# Patient Record
Sex: Male | Born: 1945 | Race: White | Hispanic: No | Marital: Single | Smoking: Never smoker
Health system: Southern US, Community
[De-identification: ages and names within clinical notes are randomized; demographics above are authoritative.]

## PROBLEM LIST (undated history)

## (undated) DIAGNOSIS — R053 Chronic cough: Secondary | ICD-10-CM

## (undated) DIAGNOSIS — H269 Unspecified cataract: Secondary | ICD-10-CM

## (undated) DIAGNOSIS — J310 Chronic rhinitis: Secondary | ICD-10-CM

## (undated) DIAGNOSIS — J45909 Unspecified asthma, uncomplicated: Secondary | ICD-10-CM

## (undated) DIAGNOSIS — J069 Acute upper respiratory infection, unspecified: Secondary | ICD-10-CM

## (undated) DIAGNOSIS — J986 Disorders of diaphragm: Secondary | ICD-10-CM

## (undated) DIAGNOSIS — H409 Unspecified glaucoma: Secondary | ICD-10-CM

## (undated) DIAGNOSIS — H6123 Impacted cerumen, bilateral: Secondary | ICD-10-CM

## (undated) DIAGNOSIS — K219 Gastro-esophageal reflux disease without esophagitis: Secondary | ICD-10-CM

## (undated) DIAGNOSIS — I2510 Atherosclerotic heart disease of native coronary artery without angina pectoris: Secondary | ICD-10-CM

## (undated) DIAGNOSIS — M5136 Other intervertebral disc degeneration, lumbar region: Secondary | ICD-10-CM

## (undated) DIAGNOSIS — N3289 Other specified disorders of bladder: Secondary | ICD-10-CM

## (undated) DIAGNOSIS — M1990 Unspecified osteoarthritis, unspecified site: Secondary | ICD-10-CM

## (undated) DIAGNOSIS — E669 Obesity, unspecified: Secondary | ICD-10-CM

## (undated) DIAGNOSIS — H359 Unspecified retinal disorder: Secondary | ICD-10-CM

## (undated) DIAGNOSIS — R05 Cough: Secondary | ICD-10-CM

## (undated) DIAGNOSIS — M51369 Other intervertebral disc degeneration, lumbar region without mention of lumbar back pain or lower extremity pain: Secondary | ICD-10-CM

## (undated) DIAGNOSIS — D649 Anemia, unspecified: Secondary | ICD-10-CM

## (undated) DIAGNOSIS — J209 Acute bronchitis, unspecified: Secondary | ICD-10-CM

## (undated) DIAGNOSIS — J019 Acute sinusitis, unspecified: Secondary | ICD-10-CM

## (undated) DIAGNOSIS — E785 Hyperlipidemia, unspecified: Secondary | ICD-10-CM

## (undated) DIAGNOSIS — I4891 Unspecified atrial fibrillation: Secondary | ICD-10-CM

## (undated) DIAGNOSIS — K5190 Ulcerative colitis, unspecified, without complications: Secondary | ICD-10-CM

## (undated) DIAGNOSIS — I10 Essential (primary) hypertension: Secondary | ICD-10-CM

## (undated) HISTORY — DX: Acute sinusitis, unspecified: J01.90

## (undated) HISTORY — DX: Essential (primary) hypertension: I10

## (undated) HISTORY — DX: Acute upper respiratory infection, unspecified: J06.9

## (undated) HISTORY — DX: Unspecified atrial fibrillation: I48.91

## (undated) HISTORY — PX: CARDIAC CATHETERIZATION: SHX172

## (undated) HISTORY — DX: Cough: R05

## (undated) HISTORY — DX: Obesity, unspecified: E66.9

## (undated) HISTORY — DX: Atherosclerotic heart disease of native coronary artery without angina pectoris: I25.10

## (undated) HISTORY — DX: Hyperlipidemia, unspecified: E78.5

## (undated) HISTORY — DX: Impacted cerumen, bilateral: H61.23

## (undated) HISTORY — DX: Chronic rhinitis: J31.0

## (undated) HISTORY — DX: Unspecified osteoarthritis, unspecified site: M19.90

## (undated) HISTORY — DX: Acute bronchitis, unspecified: J20.9

## (undated) HISTORY — DX: Gastro-esophageal reflux disease without esophagitis: K21.9

## (undated) HISTORY — PX: CORONARY ARTERY BYPASS GRAFT: SHX141

## (undated) HISTORY — DX: Disorders of diaphragm: J98.6

## (undated) HISTORY — DX: Other intervertebral disc degeneration, lumbar region without mention of lumbar back pain or lower extremity pain: M51.369

## (undated) HISTORY — DX: Morbid (severe) obesity due to excess calories: E66.01

## (undated) HISTORY — DX: Unspecified asthma, uncomplicated: J45.909

## (undated) HISTORY — DX: Other intervertebral disc degeneration, lumbar region: M51.36

## (undated) HISTORY — DX: Ulcerative colitis, unspecified, without complications: K51.90

## (undated) HISTORY — DX: Chronic cough: R05.3

---

## 2000-07-14 ENCOUNTER — Inpatient Hospital Stay (HOSPITAL_COMMUNITY): Admission: EM | Admit: 2000-07-14 | Discharge: 2000-08-01 | Payer: Self-pay | Admitting: Emergency Medicine

## 2000-07-14 ENCOUNTER — Encounter: Payer: Self-pay | Admitting: Emergency Medicine

## 2000-07-18 ENCOUNTER — Encounter: Payer: Self-pay | Admitting: Gastroenterology

## 2000-07-18 ENCOUNTER — Encounter: Payer: Self-pay | Admitting: Thoracic Surgery (Cardiothoracic Vascular Surgery)

## 2000-07-20 ENCOUNTER — Encounter: Payer: Self-pay | Admitting: Thoracic Surgery (Cardiothoracic Vascular Surgery)

## 2000-07-21 ENCOUNTER — Encounter: Payer: Self-pay | Admitting: Thoracic Surgery (Cardiothoracic Vascular Surgery)

## 2000-07-22 ENCOUNTER — Encounter: Payer: Self-pay | Admitting: Thoracic Surgery (Cardiothoracic Vascular Surgery)

## 2000-07-23 ENCOUNTER — Encounter: Payer: Self-pay | Admitting: Thoracic Surgery (Cardiothoracic Vascular Surgery)

## 2000-08-28 ENCOUNTER — Encounter
Admission: RE | Admit: 2000-08-28 | Discharge: 2000-08-28 | Payer: Self-pay | Admitting: Thoracic Surgery (Cardiothoracic Vascular Surgery)

## 2000-08-28 ENCOUNTER — Encounter: Payer: Self-pay | Admitting: Thoracic Surgery (Cardiothoracic Vascular Surgery)

## 2000-09-04 ENCOUNTER — Encounter: Payer: Self-pay | Admitting: Thoracic Surgery (Cardiothoracic Vascular Surgery)

## 2000-09-04 ENCOUNTER — Encounter
Admission: RE | Admit: 2000-09-04 | Discharge: 2000-09-04 | Payer: Self-pay | Admitting: Thoracic Surgery (Cardiothoracic Vascular Surgery)

## 2000-10-02 ENCOUNTER — Encounter: Payer: Self-pay | Admitting: Thoracic Surgery (Cardiothoracic Vascular Surgery)

## 2000-10-02 ENCOUNTER — Encounter
Admission: RE | Admit: 2000-10-02 | Discharge: 2000-10-02 | Payer: Self-pay | Admitting: Thoracic Surgery (Cardiothoracic Vascular Surgery)

## 2002-09-25 ENCOUNTER — Ambulatory Visit (HOSPITAL_COMMUNITY): Admission: RE | Admit: 2002-09-25 | Discharge: 2002-09-25 | Payer: Self-pay | Admitting: Gastroenterology

## 2002-09-25 ENCOUNTER — Encounter (INDEPENDENT_AMBULATORY_CARE_PROVIDER_SITE_OTHER): Payer: Self-pay | Admitting: Specialist

## 2005-01-23 ENCOUNTER — Ambulatory Visit: Payer: Self-pay | Admitting: Internal Medicine

## 2005-03-08 ENCOUNTER — Ambulatory Visit: Payer: Self-pay | Admitting: Internal Medicine

## 2005-06-07 ENCOUNTER — Ambulatory Visit: Payer: Self-pay | Admitting: Internal Medicine

## 2005-06-15 ENCOUNTER — Ambulatory Visit (HOSPITAL_COMMUNITY): Admission: RE | Admit: 2005-06-15 | Discharge: 2005-06-15 | Payer: Self-pay | Admitting: Internal Medicine

## 2005-08-30 ENCOUNTER — Ambulatory Visit: Payer: Self-pay | Admitting: Internal Medicine

## 2005-11-04 ENCOUNTER — Encounter: Admission: RE | Admit: 2005-11-04 | Discharge: 2005-11-04 | Payer: Self-pay | Admitting: Rheumatology

## 2005-11-28 ENCOUNTER — Ambulatory Visit: Payer: Self-pay | Admitting: Internal Medicine

## 2005-12-11 ENCOUNTER — Encounter: Admission: RE | Admit: 2005-12-11 | Discharge: 2005-12-11 | Payer: Self-pay | Admitting: Anesthesiology

## 2006-12-23 ENCOUNTER — Ambulatory Visit: Payer: Self-pay | Admitting: Internal Medicine

## 2007-09-16 ENCOUNTER — Ambulatory Visit: Payer: Self-pay | Admitting: Internal Medicine

## 2007-10-28 ENCOUNTER — Ambulatory Visit: Payer: Self-pay | Admitting: Internal Medicine

## 2007-11-11 ENCOUNTER — Ambulatory Visit: Payer: Self-pay | Admitting: Internal Medicine

## 2007-11-24 DIAGNOSIS — K5190 Ulcerative colitis, unspecified, without complications: Secondary | ICD-10-CM | POA: Insufficient documentation

## 2007-11-24 DIAGNOSIS — J45909 Unspecified asthma, uncomplicated: Secondary | ICD-10-CM

## 2007-11-24 DIAGNOSIS — R059 Cough, unspecified: Secondary | ICD-10-CM

## 2007-11-24 DIAGNOSIS — R05 Cough: Secondary | ICD-10-CM

## 2007-11-24 HISTORY — DX: Unspecified asthma, uncomplicated: J45.909

## 2007-11-24 HISTORY — DX: Morbid (severe) obesity due to excess calories: E66.01

## 2007-11-24 HISTORY — DX: Ulcerative colitis, unspecified, without complications: K51.90

## 2007-11-24 HISTORY — DX: Cough, unspecified: R05.9

## 2007-11-25 ENCOUNTER — Ambulatory Visit: Payer: Self-pay | Admitting: Pulmonary Disease

## 2007-12-26 ENCOUNTER — Ambulatory Visit: Payer: Self-pay | Admitting: Internal Medicine

## 2008-04-09 ENCOUNTER — Ambulatory Visit: Payer: Self-pay | Admitting: Internal Medicine

## 2008-04-09 DIAGNOSIS — J069 Acute upper respiratory infection, unspecified: Secondary | ICD-10-CM

## 2008-04-09 HISTORY — DX: Acute upper respiratory infection, unspecified: J06.9

## 2008-05-20 ENCOUNTER — Ambulatory Visit: Payer: Self-pay | Admitting: Internal Medicine

## 2008-05-21 ENCOUNTER — Encounter: Payer: Self-pay | Admitting: Internal Medicine

## 2008-08-27 ENCOUNTER — Ambulatory Visit: Payer: Self-pay | Admitting: Internal Medicine

## 2008-08-27 DIAGNOSIS — K219 Gastro-esophageal reflux disease without esophagitis: Secondary | ICD-10-CM

## 2008-08-27 HISTORY — DX: Gastro-esophageal reflux disease without esophagitis: K21.9

## 2009-03-01 ENCOUNTER — Ambulatory Visit: Payer: Self-pay | Admitting: Internal Medicine

## 2009-03-15 ENCOUNTER — Ambulatory Visit: Payer: Self-pay | Admitting: Internal Medicine

## 2009-03-15 ENCOUNTER — Telehealth (INDEPENDENT_AMBULATORY_CARE_PROVIDER_SITE_OTHER): Payer: Self-pay | Admitting: *Deleted

## 2009-04-08 ENCOUNTER — Encounter: Payer: Self-pay | Admitting: Internal Medicine

## 2009-05-02 ENCOUNTER — Ambulatory Visit: Payer: Self-pay | Admitting: Internal Medicine

## 2009-05-02 DIAGNOSIS — J310 Chronic rhinitis: Secondary | ICD-10-CM

## 2009-05-02 HISTORY — DX: Chronic rhinitis: J31.0

## 2009-06-09 ENCOUNTER — Encounter: Payer: Self-pay | Admitting: Internal Medicine

## 2009-08-01 ENCOUNTER — Ambulatory Visit: Payer: Self-pay | Admitting: Internal Medicine

## 2009-10-20 ENCOUNTER — Ambulatory Visit: Payer: Self-pay | Admitting: Adult Health

## 2009-10-31 ENCOUNTER — Encounter: Payer: Self-pay | Admitting: Internal Medicine

## 2009-11-01 ENCOUNTER — Ambulatory Visit: Payer: Self-pay | Admitting: Internal Medicine

## 2009-11-25 ENCOUNTER — Encounter: Payer: Self-pay | Admitting: Internal Medicine

## 2010-01-31 ENCOUNTER — Ambulatory Visit: Payer: Self-pay | Admitting: Internal Medicine

## 2010-03-14 ENCOUNTER — Ambulatory Visit: Payer: Self-pay | Admitting: Internal Medicine

## 2010-05-16 ENCOUNTER — Ambulatory Visit: Payer: Self-pay | Admitting: Internal Medicine

## 2010-08-09 ENCOUNTER — Telehealth (INDEPENDENT_AMBULATORY_CARE_PROVIDER_SITE_OTHER): Payer: Self-pay | Admitting: *Deleted

## 2010-08-14 ENCOUNTER — Ambulatory Visit: Payer: Self-pay | Admitting: Internal Medicine

## 2010-08-14 DIAGNOSIS — J019 Acute sinusitis, unspecified: Secondary | ICD-10-CM

## 2010-08-14 HISTORY — DX: Acute sinusitis, unspecified: J01.90

## 2010-09-11 ENCOUNTER — Ambulatory Visit: Payer: Self-pay | Admitting: Internal Medicine

## 2010-12-27 ENCOUNTER — Encounter: Payer: Self-pay | Admitting: Internal Medicine

## 2011-01-01 ENCOUNTER — Encounter: Payer: Self-pay | Admitting: Internal Medicine

## 2011-01-06 ENCOUNTER — Encounter: Payer: Self-pay | Admitting: Anesthesiology

## 2011-01-10 ENCOUNTER — Ambulatory Visit
Admission: RE | Admit: 2011-01-10 | Discharge: 2011-01-10 | Payer: Self-pay | Source: Home / Self Care | Attending: Adult Health | Admitting: Adult Health

## 2011-01-16 NOTE — Assessment & Plan Note (Signed)
Summary: Pulmonary/ acute ext ov with hfa teaching   Primary Provider/Referring Provider:  Danise Edge  CC:  3 month followup.  Pt c/o cough x 5 days- prod with clear to yellow sputum.  He also has noticed wheezing and increased SOB with activity.  He states that his chest is sore from cough..  History of Present Illness: 58 yowm never smoker with  relatively mild asthma but superimposed on obesity plus a chronically paralyzed left hemidiaphragm and a tendency to upper airway cough syndrome.    05/20/08 change to neb b and b two times a day and doing fine  August 27, 2008 requesting refills of reglan having slt more reflux but only prilosec once daily, no new resp problems or need for rescue rx, rec try off reglan  March 01, 2009 ov doing well on med calendar and neb with b and b until one week prior to ov increase sinus drainage and cough and sob slt yellow mucus.  doe but not at rest.  March 15, 2009 - pt here for med calendar visit and also acutely ill.  Med calendar with few additions.  Pt has no complaints of current med regimen.  Pt has low grade temp of 100.3 today, symptoms began this four days ago, c/o nasal congestion, sore throat, maxillary sinus tenderness, chest congestion, productive cough of light yellow sputum, SOB with exertion, chest tightness, occasional wheezing, ear fullness, HA. rx augmentin no prednisone > better  January 31, 2010 ov  c/o cough x 5 days- prod with clear to yellow sputum.  He also has noticed wheezing and increased SOB with activity.  He states that his chest is sore from cough. states following med calendar prns well.  no lateralizing pleuritic cp.  Pt denies any significant sore throat, dysphagia, itching, sneezing,  nasal congestion or excess secretions,  fever, chills, sweats, unintended wt loss,  exertional cp, hempoptysis, change in activity tolerance  orthopnea pnd or leg swelling.     Current Medications (verified): 1)  Lipitor 40 Mg  Tabs  (Atorvastatin Calcium) .... Take 1 Tablet By Mouth Once A Day 2)  Zetia 10 Mg  Tabs (Ezetimibe) .... Take 1 Tablet By Mouth Once A Day 3)  Metoprolol Tartrate 25 Mg  Tabs (Metoprolol Tartrate) .... Take 1 Tablet By Mouth Once A Day 4)  Folic Acid 1 Mg  Tabs (Folic Acid) .... Take 1 Tablet By Mouth Once A Day 5)  Diltiazem Hcl Cr 180 Mg Xr24h-Cap (Diltiazem Hcl) .... Take 1 Tablet By Mouth Once A Day 6)  Digoxin 0.125 Mg  Tabs (Digoxin) .... Take 1 Tablet By Mouth Once A Day 7)  Prilosec 20 Mg  Cpdr (Omeprazole) .... Take 1 Tablet By Mouth Once A Day 8)  Colace 100 Mg Caps (Docusate Sodium) .... Take 1 Capsule By Mouth Once A Day 9)  Sulfasalazine 500 Mg  Tabs (Sulfasalazine) .... Take 2 Tablets By Mouth Four Times A Day 10)  Cvs Eye Drops 0.05 %  Soln (Tetrahydrozoline Hcl) .... 2 Drops in Each Eye  Two Times A Day 11)  Cvs Glucosamine-Chondroitin   Tabs (Glucosamine-Chondroit-Vit C-Mn) .... Take 1 Tablet By Mouth Two Times A Day 12)  Ocuvite   Tabs (Multiple Vitamins-Minerals) .... Take 2 Tabs By Mouth Two Times A Day 13)  Acetaminophen-Codeine #3 300-30 Mg  Tabs (Acetaminophen-Codeine) .... Take 1 Tablet By Mouth Two Times A Day 14)  Bayer Low Strength 81 Mg  Tbec (Aspirin) .... Take 1 Tablet By Mouth Once  A Day 15)  Brovana 15 Mcg/22ml Nebu (Arformoterol Tartrate) .[name].. 1 Vial in Nebulizer Two Times A Day 16)  Pulmicort 0.5 Mg/68ml Susp (Budesonide) .[name].. 1 Vial in Nebulizer Two Times A Day 17)  Mucinex Dm 30-600 Mg  Tb12 (Dextromethorphan-Guaifenesin) .... Take 1 To 2 Tabs By Mouth Every 12 Hours As Needed 18)  Proventil Hfa 108 (90 Base) Mcg/act  Aers (Albuterol Sulfate) .[name].. 1-2 Puffs Every 4 Hours As Needed 19)  Dulcolax 5 Mg Tbec (Bisacodyl) .... Take 1 Tablet By Mouth Once A Day As Needed 20)  Saline Nasal Spray 0.65 % Soln (Saline) .... 2 Puffs Every 4 Hours As Needed 21)  Advil Cold/sinus 30-200 Mg Tabs (Pseudoephedrine-Ibuprofen) .... As Directed As Needed 22)  Zyrtec Allergy 10 Mg Tabs  (Cetirizine Hcl) .... Take 1 Tab By Mouth At Bedtime As Needed 23)  Pepcid Ac Maximum Strength 20 Mg Tabs (Famotidine) .[name].. 1 At Bedtime As Needed For Increased Cough/congestion  Allergies (verified): 1)  ! 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 Health Maintenance...........................................................Daphine Deutscher     - Pneumovax age 26 November 01, 2009 (second shot)   Vital Signs:  Patient profile:   65 year old male Weight:      248 pounds O2 Sat:      92 % on Room air Temp:     97.8 degrees F oral Pulse rate:   82 / minute BP sitting:   128 / 80  (left arm)  Vitals Entered By: Vernie Murders (January 31, 2010 10:17 AM)  O2 Flow:  Room air  Physical Exam  Additional Exam:  GEN: A/Ox3; pleasant , NAD   wt  250 May 02, 2009 > 249 August 01, 2009 >>246 October 20, 2009 > 251 November 01, 2009 > 248 January 31, 2010  HEENT:  [place]/AT, , EACs-clear, TMs-wnl, NOSE-clear, THROAT-reddened, not swollen, no drainage observed, bilateral maxillary sinus tenderness elicited NECK:  Supple w/ fair ROM; no JVD; normal carotid impulses w/o bruits; no thyromegaly or nodules palpated; no lymphadenopathy. RESP  Distant  BS w/  minimal  wheezing  CARD:  RRR, no m/r/g   GI:   Soft & nt; nml bowel sounds; no organomegaly or masses detected. Musco: Warm bil,  no calf tenderness edema, clubbing, pulses intact     Impression & Recommendations:  Problem # 1:  ASTHMA (ICD-493.90)  I spent extra time with the patient today explaining optimal mdi  technique.  This improved from   25 -50%, luckily he has a nebulizer for maint rx or he'd likely become systemically steroid dependent. for now should be able to get back to baseline with one burst of prednisone.  I had an extended discussion with the patient today lasting 15 to 20 minutes of a 25 minute visit on the following issues:  Each maintenance medication was reviewed in detail including most importantly the difference between maintenance and as needed and under what circumstances the prns are to be used.This was done in the context of a medication calendar review which provided the patient with a user-friendly unambiguous mechanism for  medication administration and reconciliation and provides an action plan for all active problems. It is critical that this be shown to every doctor  for modification during the office visit if necessary so the patient can use it as a working document. See instructions for specific recommendations.  Will need f/u ov with new calendar next ov.  Problem # 2:  URI (ICD-465.9)  Explained natural h/o uri and why it's necessary in patients at risk to rx short term with max Rx to reduce risk of evolving cyclical cough triggered by epithelial injury and a heightened sensitivty to the effects of any upper airway irritants,  most importantly acid - related    His updated medication list for this problem includes:    Bayer Low Strength 81 Mg Tbec (Aspirin) .[name]... Take 1 tablet by mouth once a day    Mucinex Dm 30-600 Mg Tb12 (Dextromethorphan-guaifenesin) .[name]... Take 1 to 2 tabs by mouth every 12 hours as needed    Advil Cold/sinus 30-200 Mg Tabs (Pseudoephedrine-ibuprofen) .[name]... As directed as needed    Zyrtec Allergy 10 Mg Tabs (Cetirizine hcl) .[name]... Take 1 tab by mouth at bedtime as needed  Orders: Est. Patient Level III ([address])  Medications Added to Medication List This Visit: 1)  Pepcid Ac Maximum Strength 20 Mg Tabs (Famotidine) .[name].. 1 at bedtime as needed for increased cough/congestion 2)   Proair Hfa 108 (90 Base) Mcg/act Aers (Albuterol sulfate) .[name].. 1-2 puffs every 4-6 hours as needed 3)  Prednisone 10 Mg Tabs (Prednisone) .... 4 each am x 2days, 2x2days, 1x2days and stop 4)  Augmentin 875-125 Mg Tabs (Amoxicillin-pot clavulanate) .... By mouth twice daily  Other Orders: HFA Instruction [phone])  Patient Instructions: 1)  Prednsione 4 each am x 2days, 2x2days, 1x2days and stop 2)  Augmentin 875 mg twice daily for 7 days, yogurt for lunch 3)  See [name] NP w/in 6 weeks with all your medications, even over the counter meds, separated in two separate bags, the ones you take no matter what vs the ones you stop once you feel better and take only as needed.  She will generate for you a new user friendly medication calendar that will put [place] all on the same page re: your medication use.  Prescriptions: AUGMENTIN 875-125 MG  TABS (AMOXICILLIN-POT CLAVULANATE) By mouth twice daily  #14 x 0   Entered and Authorized by:   Nyoka Cowden MD   Signed by:   Nyoka Cowden MD on 01/31/2010   Method used:   Electronically to        [facility] [phone]* (retail)       [address]       [place], [place]  [address]       Ph: [phone] or [phone]       Fax: [phone]   RxID:   [id number] PREDNISONE 10 MG  TABS (PREDNISONE) 4 each am x 2days, 2x2days, 1x2days and stop  #15 x 0   Entered and Authorized by:   Nyoka Cowden MD   Signed by:   Nyoka Cowden MD on 01/31/2010   Method used:   Electronically to        [facility] [phone][facility] (retail)       [address]       [place], [place]  [address]       Ph: [phone] or [phone]       Fax: [phone]   RxID:   [id number] PROAIR HFA 108 (  90 BASE) MCG/ACT  AERS (ALBUTEROL SULFATE) 1-2 puffs every 4-6 hours as needed  #1 x 11   Entered and Authorized by:   Nyoka Cowden MD   Signed by:   Nyoka Cowden MD on 01/31/2010   Method used:   Electronically to        [facility] [phone]* (retail)       [address]        [place], [place]  [address]       Ph: [phone] or [phone]       Fax: [phone]   RxID:   [id number]

## 2011-01-16 NOTE — Assessment & Plan Note (Signed)
Summary: Pulmonary/ acute flare asthma   Primary [place]/Referring Knox Holdman:  Danise Edge  CC:  2 month followup.  Pt states that his cough is the same- still coughs up clear thick sputum daily.  No new complaints today.[name]  History of Present Illness: 56 yowm never smoker with  relatively mild asthma but superimposed on obesity plus a chronically paralyzed left hemidiaphragm and a tendency to upper airway cough syndrome.    05/20/08 change to neb b and b two times a day and doing fine  August 27, 2008 requesting refills of reglan having slt more reflux but only prilosec once daily, no new resp problems or need for rescue rx, rec try off reglan  March 01, 2009 ov doing well on med calendar and neb with b and b until one week prior to ov increase sinus drainage and cough and sob slt yellow mucus.  doe but not at rest.  March 15, 2009 - pt here for med calendar visit and also acutely ill.  Med calendar with few additions.  Pt has no complaints of current med regimen.  Pt has low grade temp of 100.3 today, symptoms began this four days ago, c/o nasal congestion, sore throat, maxillary sinus tenderness, chest congestion, productive cough of light yellow sputum, SOB with exertion, chest tightness, occasional wheezing, ear fullness, HA. rx augmentin no prednisone > better  January 31, 2010 ov  c/o cough x 5 days- prod with clear to yellow sputum.  He also has noticed wheezing and increased SOB with activity.  He states that his chest is sore from cough. states following med calendar prns well.  no lateralizing pleuritic cp.  rx augmentn and prednisone and resolved  May 16, 2010 2 month followup.  Pt states that his cough is the same- still coughs up clear thick sputum daily.  No new complaints today. rx with mucinex dm and pepcid 20 mg on as needed basis but not consistently activating written action plan. Pt denies any significant sore throat, dysphagia, itching, sneezing,  nasal congestion or  excess secretions,  fever, chills, sweats, unintended wt loss, pleuritic or exertional cp, hempoptysis, change in activity tolerance  orthopnea pnd or leg swelling/    Current Medications (verified): 1)  Lipitor 40 Mg  Tabs (Atorvastatin Calcium) .... Take 1 Tablet By Mouth Once A Day 2)  Zetia 10 Mg  Tabs (Ezetimibe) .... Take 1 Tablet By Mouth Once A Day 3)  Metoprolol Tartrate 25 Mg  Tabs (Metoprolol Tartrate) .... Take 1 Tablet By Mouth Once A Day 4)  Folic Acid 1 Mg  Tabs (Folic Acid) .... Take 1 Tablet By Mouth Once A Day 5)  Diltiazem Hcl Cr 180 Mg Xr24h-Cap (Diltiazem Hcl) .... Take 1 Tablet By Mouth Once A Day 6)  Digoxin 0.125 Mg  Tabs (Digoxin) .... Take 1 Tablet By Mouth Once A Day 7)  Prilosec 20 Mg  Cpdr (Omeprazole) .... Take 1 Tablet By Mouth Once A Day 8)  Bayer Low Strength 81 Mg  Tbec (Aspirin) .... Take 1 Tablet By Mouth Once A Day 9)  Sulfasalazine 500 Mg  Tabs (Sulfasalazine) .... Take 2 Tablets By Mouth Four Times A Day 10)  Cvs Eye Drops 0.05 %  Soln (Tetrahydrozoline Hcl) .... 2 Drops in Each Eye  Two Times A Day 11)  Cvs Glucosamine-Chondroitin   Tabs (Glucosamine-Chondroit-Vit C-Mn) .... Take 1 Tablet By Mouth Two Times A Day 12)  Ocuvite   Tabs (Multiple Vitamins-Minerals) .... Take 2 Tabs By Mouth Two  Times A Day 13)  Tylenol Extra Strength 500 Mg Tabs (Acetaminophen) .... Take 1 Tablet By Mouth Two Times A Day 14)  Pulmicort 0.5 Mg/5ml Susp (Budesonide) .[name].. 1 Vial in Nebulizer Two Times A Day 15)  Brovana 15 Mcg/57ml Nebu (Arformoterol Tartrate) .[name].. 1 Vial in Nebulizer Two Times A Day 16)  Mucinex Dm 30-600 Mg  Tb12 (Dextromethorphan-Guaifenesin) .... Take 1 To 2 Tabs By Mouth Every 12 Hours As Needed 17)  Proair Hfa 108 (90 Base) Mcg/act  Aers (Albuterol Sulfate) .... 2 Puffs Every 4 Hours As Needed 18)  Colace 100 Mg Caps (Docusate Sodium) .... Take 1 Capsule By Mouth Once A Day As Needed 19)  Dulcolax 5 Mg Tbec (Bisacodyl) .... Take 1 Tablet By Mouth Once A  Day As Needed 20)  Saline Nasal Spray 0.65 % Soln (Saline) .... 2 Puffs Every 4 Hours As Needed 21)  Zyrtec Allergy 10 Mg Tabs (Cetirizine Hcl) .... Take 1 Tab By Mouth At Bedtime As Needed 22)  Advil Cold/sinus 30-200 Mg Tabs (Pseudoephedrine-Ibuprofen) .... Per Bottle As Needed 23)  Pepcid Ac Maximum Strength 20 Mg Tabs (Famotidine) .[name].. 1 At Bedtime As Needed For Increased Cough/congestion  Allergies (verified): 1)  ! 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 Health Maintenance..............................................................Daphine Deutscher     - Pneumovax age 1 November 01, 2009 (second shot)   Vital Signs:  Patient profile:   66 year old male Weight:      248 pounds O2 Sat:      92 % on Room air Temp:     98.3 degrees F oral Pulse rate:   87 / minute BP sitting:   118 / 60  (left arm)  Vitals Entered By: Vernie Murders (May 16, 2010 11:49 AM)  O2 Flow:  Room air  Physical Exam  Additional Exam:  GEN: A/Ox3; pleasant , NAD   wt  250 May 02, 2009 >  251 November 01, 2009 > 248 January 31, 2010 >>251 March 14, 2010 > 248 May 16, 2010  HEENT:  [place]/AT, , EACs-clear, TMs-wnl, NOSE-clear, THROAT clear no drainage observed,   NECK:  Supple w/ fair ROM; no JVD; normal carotid impulses w/o bruits; no thyromegaly or nodules palpated; no lymphadenopathy. RESP  Distant  BS  no wheezing  CARD:  RRR, no m/r/g   GI:   Soft & nt; nml bowel sounds; no organomegaly or masses detected. Musco: Warm bil,  no calf tenderness edema, clubbing, pulses intact     Impression &  Recommendations:  Problem # 1:  ASTHMA (ICD-493.90) DDX of  difficult airways managment all start with A and  include Adherence, Ace Inhibitors, Acid Reflux, Active Sinus Disease, Alpha 1 Antitripsin deficiency, Anxiety masquerading as Airways dz,  ABPA,  allergy(esp in young), Aspiration (esp in elderly), Adverse effects of DPI,  Active smokers, plus one B  = Beta blocker use..   Adherence:  doing well with med calendar maint rx but not with contingencies:  try change pepcid to 20 mg at bedtime as maintenance.  Each maintenance medication was reviewed in detail including  most importantly the difference between maintenance prns and under what circumstances the prns are to be used.  In addition, these two groups (for which the patient should keep up with refills) were distinguished from a third group :  meds that are used only short term with the intent to complete a course of therapy and then not refill them.  The med list was then fully reconciled and reorganized to reflect this important distinction. This was done in the context of a medication calendar review which provided the patient with a user-friendly unambiguous mechanism for medication administration and reconciliation and provides an action plan for all active problems. It is critical that this be shown to every doctor  for modification during the office visit if necessary so the patient can use it as a working document.   Medications Added to Medication List This Visit: 1)  Pepcid Ac Maximum Strength 20 Mg Tabs (Famotidine) .[name].. 1 at bedtime 2)  Prednisone 10 Mg Tabs (Prednisone) .... 4 each am x 2days, 2x2days, 1x2days and stop  Other Orders: Est. Patient Level III ([address])  Patient Instructions: 1)  Change pepcid to 20 mg one at bedtime (not as needed) 2)  Prednisone 10 mg 4 each am x 2days, 2x2days, 1x2days and stop  3)  Return to office in 3 months, sooner if needed  Prescriptions: PREDNISONE 10 MG  TABS (PREDNISONE) 4 each am x  2days, 2x2days, 1x2days and stop  #14 x 0   Entered and Authorized by:   Nyoka Cowden MD   Signed by:   Nyoka Cowden MD on 05/16/2010   Method used:   [facility]ron[facility] to        [facility] [phone]* (retail)       [address]       [place], [place]  [address]       Ph: [phone] or [phone]       Fax: [phone]   RxID:   [id number]   Appended Document: Pulmonary/ acute flare asthma please fax dr Donnie Aho a copy of this note for his records  Appended Document: Pulmonary/ acute flare asthma Note faxed to Dr Donnie Aho via [place].

## 2011-01-16 NOTE — Assessment & Plan Note (Signed)
Summary: med calendar update   Primary Provider/Referring Provider:  Danise Edge  CC:  est med calendar - pt brought all meds with him today.  c/o increased SOB, wheezing, chest congestion, dry cough, and sinus pressure/congestion and ear pressure x1week - denies f/c/s.  History of Present Illness: 38 yowm never smoker with  relatively mild asthma but superimposed on obesity plus a chronically paralyzed left hemidiaphragm and a tendency to upper airway cough syndrome.    05/20/08 change to neb b and b two times a day and doing fine  August 27, 2008 requesting refills of reglan having slt more reflux but only prilosec once daily, no new resp problems or need for rescue rx, rec try off reglan  March 01, 2009 ov doing well on med calendar and neb with b and b until one week prior to ov increase sinus drainage and cough and sob slt yellow mucus.  doe but not at rest.  March 15, 2009 - pt here for med calendar visit and also acutely ill.  Med calendar with few additions.  Pt has no complaints of current med regimen.  Pt has low grade temp of 100.3 today, symptoms began this four days ago, c/o nasal congestion, sore throat, maxillary sinus tenderness, chest congestion, productive cough of light yellow sputum, SOB with exertion, chest tightness, occasional wheezing, ear fullness, HA. rx augmentin no prednisone > better  January 31, 2010 ov  c/o cough x 5 days- prod with clear to yellow sputum.  He also has noticed wheezing and increased SOB with activity.  He states that his chest is sore from cough. states following med calendar prns well.  no lateralizing pleuritic cp.  rx augmentn and prednisone and resolved  May 16, 2010 2 month followup.  Pt states that his cough is the same- still coughs up clear thick sputum daily.  No new complaints today. rx with mucinex dm and pepcid 20 mg on as needed basis but not consistently activating written action plan.   August 14, 2010--Presents for follow up  and med review.  pt brought all meds with him today.  Also he is feeling sick for last 1 week. He complains of  increased SOB, wheezing, chest congestion, dry cough, sinus pressure/congestion and ear pressure x1week. . We reviewed his meds and updated his med calendar. Emphasized the importance of keeping up with his meds. OTC not helping his sinus congestion. Denies chest pain, dyspnea, orthopnea, hemoptysis, fever, n/v/d, edema.  Medications Prior to Update: 1)  Lipitor 40 Mg  Tabs (Atorvastatin Calcium) .... Take 1 Tablet By Mouth Once A Day 2)  Zetia 10 Mg  Tabs (Ezetimibe) .... Take 1 Tablet By Mouth Once A Day 3)  Metoprolol Tartrate 25 Mg  Tabs (Metoprolol Tartrate) .... Take 1 Tablet By Mouth Once A Day 4)  Folic Acid 1 Mg  Tabs (Folic Acid) .... Take 1 Tablet By Mouth Once A Day 5)  Diltiazem Hcl Cr 180 Mg Xr24h-Cap (Diltiazem Hcl) .... Take 1 Tablet By Mouth Once A Day 6)  Digoxin 0.125 Mg  Tabs (Digoxin) .... Take 1 Tablet By Mouth Once A Day 7)  Prilosec 20 Mg  Cpdr (Omeprazole) .... Take 1 Tablet By Mouth Once A Day 8)  Bayer Low Strength 81 Mg  Tbec (Aspirin) .... Take 1 Tablet By Mouth Once A Day 9)  Sulfasalazine 500 Mg  Tabs (Sulfasalazine) .... Take 2 Tablets By Mouth Four Times A Day 10)  Cvs Eye Drops 0.05 %  Soln (  Tetrahydrozoline Hcl) .... 2 Drops in Each Eye  Two Times A Day 11)  Cvs Glucosamine-Chondroitin   Tabs (Glucosamine-Chondroit-Vit C-Mn) .... Take 1 Tablet By Mouth Two Times A Day 12)  Ocuvite   Tabs (Multiple Vitamins-Minerals) .... Take 2 Tabs By Mouth Two Times A Day 13)  Tylenol Extra Strength 500 Mg Tabs (Acetaminophen) .... Take 1 Tablet By Mouth Two Times A Day 14)  Pulmicort 0.5 Mg/53ml Susp (Budesonide) .[name].. 1 Vial in Nebulizer Two Times A Day 15)  Brovana 15 Mcg/69ml Nebu (Arformoterol Tartrate) .[name].. 1 Vial in Nebulizer Two Times A Day 16)  Pepcid Ac Maximum Strength 20 Mg Tabs (Famotidine) .[name].. 1 At Bedtime 17)  Mucinex Dm 30-600 Mg  Tb12  (Dextromethorphan-Guaifenesin) .... Take 1 To 2 Tabs By Mouth Every 12 Hours As Needed 18)  Proair Hfa 108 (90 Base) Mcg/act  Aers (Albuterol Sulfate) .... 2 Puffs Every 4 Hours As Needed 19)  Colace 100 Mg Caps (Docusate Sodium) .... Take 1 Capsule By Mouth Once A Day As Needed 20)  Dulcolax 5 Mg Tbec (Bisacodyl) .... Take 1 Tablet By Mouth Once A Day As Needed 21)  Saline Nasal Spray 0.65 % Soln (Saline) .... 2 Puffs Every 4 Hours As Needed 22)  Zyrtec Allergy 10 Mg Tabs (Cetirizine Hcl) .... Take 1 Tab By Mouth At Bedtime As Needed 23)  Advil Cold/sinus 30-200 Mg Tabs (Pseudoephedrine-Ibuprofen) .... Per Bottle As Needed 24)  Prednisone 10 Mg  Tabs (Prednisone) .... 4 Each Am X 2days, 2x2days, 1x2days and Stop  Allergies (verified): 1)  ! 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  c/o increased SOB, wheezing, chest congestion, dry cough, sinus pressure/congestion and ear pressure x1week - denies f/c/s Is Patient Diabetic? No Comments Medications reviewed with patient Daytime contact number verified with patient. Boone Master CNA/MA  August 14, 2010 9:02 AM    Physical Exam  Additional Exam:  GEN: A/Ox3; pleasant , NAD   wt  250 May 02, 2009 >  251 November 01, 2009 > 248 January 31, 2010 >>251 March 14, 2010 > 248 May 16, 2010 >>248  August 14, 2010 HEENT:  [place]/AT, , EACs-clear, TMs-wnl, NOSE-mild redness , THROAT clear, max tenderness  NECK:  Supple w/ fair ROM; no JVD; normal carotid impulses w/o bruits; no thyromegaly or nodules palpated; no lymphadenopathy. RESP  Distant  BS  no wheezing  CARD:  RRR, no m/r/g   GI:   Soft & nt; nml bowel sounds; no organomegaly or masses detected. Musco: Warm bil,  no calf tenderness edema, clubbing, pulses intact     Impression & Recommendations:  Problem # 1:  ACUTE SINUSITIS, UNSPECIFIED (ICD-461.9)  Augmentin 875mg  two times a day for 10 days -take w/ food Mucinex DM two times a day as needed cough/congestion  Continue w/ saline nasal rinses as needed  Follow med calendar closely and bring to each visit.  Please contact office for sooner follow up if symptoms do not improve or worsen  follow up NP Moishy Laday 4-6 weeks.  His updated medication list for this problem includes:    Mucinex Dm 30-600 Mg Tb12 (Dextromethorphan-guaifenesin) .[name]... Take 1 to 2 tabs by mouth every 12 hours as needed    Saline Nasal Spray 0.65 % Soln (Saline) .[name]... 2 puffs every 4 hours as needed    Advil Cold/sinus 30-200 Mg Tabs (Pseudoephedrine-ibuprofen) .[name]... Per bottle as needed    Augmentin 875-125 Mg Tabs (Amoxicillin-pot clavulanate) .[name]... 1 by mouth two times a day  Orders: Est. Patient Level IV  ([address])  Problem # 2:  ASTHMA (ICD-493.90) we discussed his meds and organized them in his med calendar   Medications Added to Medication List This Visit: 1)  Augmentin 875-125 Mg Tabs (Amoxicillin-pot clavulanate) .[name].. 1 by mouth two times a day  Patient Instructions: 1)  Augmentin 875mg  two times a day for 10 days -take w/ food 2)  Mucinex DM two times a day as needed cough/congestion  3)  Continue w/ saline nasal rinses as needed  4)  Follow med calendar closely and bring to each visit.  5)  Please contact office for sooner follow up if symptoms do not improve or worsen  6)  follow up NP Ronnica Dreese 4-6 weeks.  Prescriptions: AUGMENTIN 875-125 MG TABS (AMOXICILLIN-POT CLAVULANATE) 1 by mouth two times a day  #20 x 0   Entered and Authorized by:   Rubye Oaks NP   Signed by:   Daquon Greenleaf NP on 08/14/2010   Method used:   Electronically to        [facility]       [address]       [place], [place]  [address]       Ph: [phone] or [phone]       Fax: [phone]   RxID:   [id number]

## 2011-01-16 NOTE — Assessment & Plan Note (Signed)
Summary: NP follow up - med calendar   Primary Provider/Referring Provider:  Danise Edge  CC:  est med calendar - pt brought all meds with him today.  pt does c/o a return of his head and chest congestion with clear mucus with wheezing and SOB x1 week.  the abx and pred taper from last OV did help but sx returned.  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    05/20/08 change to neb b and b two times a day and doing fine  August 27, 2008 requesting refills of reglan having slt more reflux but only prilosec once daily, no new resp problems or need for rescue rx, rec try off reglan  March 01, 2009 ov doing well on med calendar and neb with b and b until one week prior to ov increase sinus drainage and cough and sob slt yellow mucus.  doe but not at rest.  March 15, 2009 - pt here for med calendar visit and also acutely ill.  Med calendar with few additions.  Pt has no complaints of current med regimen.  Pt has low grade temp of 100.3 today, symptoms began this four days ago, c/o nasal congestion, sore throat, maxillary sinus tenderness, chest congestion, productive cough of light yellow sputum, SOB with exertion, chest tightness, occasional wheezing, ear fullness, HA. rx augmentin no prednisone > better  January 31, 2010 ov  c/o cough x 5 days- prod with clear to yellow sputum.  He also has noticed wheezing and increased SOB with activity.  He states that his chest is sore from cough. states following med calendar prns well.  no lateralizing pleuritic cp.    March 14, 2010--Presents for follow up and med review. Last visit, w/ bronchitic flare tx w/ Augmentin and prednisone taper. He is feeling better w/ decreased cough/congestion , does notice w/ change in weather (snow/cold temp) , nasal congestion/cough returning. no discolored  mucus/fever. We reviewed his meds , went over scenarios of how to take as needed" meds. Denies chest pain,  orthopnea, hemoptysis, fever, n/v/d, edema, headache.   Medications Prior to Update: 1)  Lipitor 40 Mg  Tabs (Atorvastatin Calcium) .... Take 1 Tablet By Mouth Once A Day 2)  Zetia 10 Mg  Tabs (Ezetimibe) .... Take 1 Tablet By Mouth Once A Day 3)  Metoprolol Tartrate 25 Mg  Tabs (Metoprolol Tartrate) .... Take 1 Tablet By Mouth Once A Day 4)  Folic Acid 1 Mg  Tabs (Folic Acid) .... Take 1 Tablet By Mouth Once A Day 5)  Diltiazem Hcl Cr 180 Mg Xr24h-Cap (Diltiazem Hcl) .... Take 1 Tablet By Mouth Once A Day 6)  Digoxin 0.125 Mg  Tabs (Digoxin) .... Take 1 Tablet By Mouth Once A Day 7)  Prilosec 20 Mg  Cpdr (Omeprazole) .... Take 1 Tablet By Mouth Once A Day 8)  Colace 100 Mg Caps (Docusate Sodium) .... Take 1 Capsule By Mouth Once A Day 9)  Sulfasalazine 500 Mg  Tabs (Sulfasalazine) .... Take 2 Tablets By Mouth Four Times A Day 10)  Cvs Eye Drops 0.05 %  Soln (Tetrahydrozoline Hcl) .... 2 Drops in Each Eye  Two Times A Day 11)  Cvs Glucosamine-Chondroitin   Tabs (Glucosamine-Chondroit-Vit C-Mn) .... Take 1 Tablet By Mouth Two Times A Day 12)  Ocuvite   Tabs (Multiple Vitamins-Minerals) .... Take 2 Tabs By  Mouth Two Times A Day 13)  Acetaminophen-Codeine #3 300-30 Mg  Tabs (Acetaminophen-Codeine) .... Take 1 Tablet By Mouth Two Times A Day 14)  Bayer Low Strength 81 Mg  Tbec (Aspirin) .... Take 1 Tablet By Mouth Once A Day 15)  Brovana 15 Mcg/67ml Nebu (Arformoterol Tartrate) .[name].. 1 Vial in Nebulizer Two Times A Day 16)  Pulmicort 0.5 Mg/54ml Susp (Budesonide) .[name].. 1 Vial in Nebulizer Two Times A Day 17)  Mucinex Dm 30-600 Mg  Tb12 (Dextromethorphan-Guaifenesin) .... Take 1 To 2 Tabs By Mouth Every 12 Hours As Needed 18)  Dulcolax 5 Mg Tbec (Bisacodyl) .... Take 1 Tablet By Mouth Once A Day As Needed 19)  Saline Nasal Spray 0.65 % Soln (Saline) .... 2 Puffs Every 4 Hours As Needed 20)  Advil  Cold/sinus 30-200 Mg Tabs (Pseudoephedrine-Ibuprofen) .... As Directed As Needed 21)  Zyrtec Allergy 10 Mg Tabs (Cetirizine Hcl) .... Take 1 Tab By Mouth At Bedtime As Needed 22)  Pepcid Ac Maximum Strength 20 Mg Tabs (Famotidine) .[name].. 1 At Bedtime As Needed For Increased Cough/congestion 23)  Proair Hfa 108 (90 Base) Mcg/act  Aers (Albuterol Sulfate) .[name].. 1-2 Puffs Every 4-6 Hours As Needed 24)  Prednisone 10 Mg  Tabs (Prednisone) .... 4 Each Am X 2days, 2x2days, 1x2days and Stop 25)  Augmentin 875-125 Mg  Tabs (Amoxicillin-Pot Clavulanate) .... By Mouth Twice Daily  Allergies (verified): 1)  ! 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  pt does c/o a return of his head and chest congestion with clear mucus with wheezing and SOB x1 week.  the abx and pred taper from last OV did help but sx returned Is Patient Diabetic? No Comments Medications reviewed with patient Daytime contact number verified with patient. Boone Master CNA  March 14, 2010 9:12 AM    Physical Exam  Additional Exam:  GEN: A/Ox3; pleasant , NAD   wt  250 May 02, 2009 > 249 August 01, 2009 >>246  October 20, 2009 > 251 November 01, 2009 > 248 January 31, 2010 >>251 March 14, 2010  HEENT:  [place]/AT, , EACs-clear, TMs-wnl, NOSE-clear, THROAT clear no drainage observed,   NECK:  Supple w/ fair ROM; no JVD; normal carotid impulses w/o bruits; no thyromegaly or nodules palpated; no lymphadenopathy. RESP  Distant  BS  no wheezing  CARD:  RRR, no m/r/g   GI:   Soft & nt; nml bowel sounds; no organomegaly or masses detected. Musco: Warm bil,  no calf tenderness edema, clubbing, pulses intact     Impression & Recommendations:  Problem # 1:  ASTHMA (ICD-493.90) Recent flare w/ associated sinusitis now resolved.  advised on tx of rhinitis flare  Meds reviewed with pt education and computerized med calendar completed/adjusted.     Complete Medication List: 1)  Lipitor 40 Mg Tabs (Atorvastatin calcium) .... Take 1 tablet by mouth once a day 2)  Zetia 10 Mg Tabs (Ezetimibe) .... Take 1 tablet by mouth once a day 3)  Metoprolol Tartrate 25 Mg Tabs (Metoprolol tartrate) .... Take 1 tablet by mouth once a day 4)  Folic Acid 1 Mg Tabs (Folic acid) .... Take 1 tablet by mouth once a day 5)  Diltiazem Hcl Cr 180 Mg Xr24h-cap (Diltiazem hcl) .... Take 1 tablet by mouth once a day 6)  Digoxin 0.125 Mg Tabs (Digoxin) .... Take 1 tablet by mouth once a day 7)  Prilosec 20 Mg Cpdr (Omeprazole) .... Take 1 tablet by mouth once a day 8)  Colace 100 Mg Caps  (Docusate sodium) .... Take 1 capsule by mouth once a day 9)  Sulfasalazine 500 Mg Tabs (Sulfasalazine) .... Take 2 tablets by mouth four times a day 10)  Cvs Eye Drops 0.05 % Soln (Tetrahydrozoline hcl) .... 2 drops in each eye  two times a day 11)  Cvs Glucosamine-chondroitin Tabs (Glucosamine-chondroit-vit c-mn) .... Take 1 tablet by mouth two times a day 12)  Ocuvite Tabs (Multiple vitamins-minerals) .... Take 2 tabs by mouth two times a day 13)  Acetaminophen-codeine #3 300-30 Mg Tabs (Acetaminophen-codeine) .... Take 1 tablet by mouth two times a day 14)  Bayer Low Strength 81 Mg Tbec (Aspirin) .... Take 1 tablet by mouth once a day 15)  Brovana 15 Mcg/31ml Nebu (Arformoterol tartrate) .[name].. 1 vial in nebulizer two times a day 16)  Pulmicort 0.5 Mg/26ml Susp (Budesonide) .[name].. 1 vial in nebulizer two times a day 17)  Mucinex Dm 30-600 Mg Tb12 (Dextromethorphan-guaifenesin) .... Take 1 to 2 tabs by mouth every 12 hours as needed 18)  Dulcolax 5 Mg Tbec (Bisacodyl) .... Take 1 tablet by mouth once a day as needed 19)  Saline Nasal Spray 0.65 % Soln (Saline) .... 2 puffs every 4 hours as needed 20)  Advil Cold/sinus 30-200 Mg Tabs (Pseudoephedrine-ibuprofen) .... As directed as needed 21)  Zyrtec Allergy 10 Mg Tabs (Cetirizine hcl) .... Take 1 tab by mouth at bedtime as needed 22)  Pepcid Ac Maximum Strength 20 Mg Tabs (Famotidine) .[name].. 1 at bedtime as needed for increased cough/congestion 23)  Proair Hfa 108 (90 Base) Mcg/act Aers (Albuterol sulfate) .[name].. 1-2 puffs every 4-6 hours as needed 24)  Prednisone 10 Mg Tabs (Prednisone) .... 4 each am x 2days, 2x2days, 1x2days and stop 25)  Augmentin 875-125 Mg Tabs (Amoxicillin-pot clavulanate) .... By mouth twice daily  Other Orders: Est. Patient Level III ([address]109)  Patient Instructions: 1)  Change Zyrtec D to plain Zyrtec once daily as needed (this on the  bottom of your med calendar to be taken "on as needed basis only" 2)  Follow med calendar  closely. and bring to each visit.  3)  May add back Advil cold/sinus for sinus pain/pressure symtpoms -as needed symptoms" only.  4)  follow up Dr. Sherene Sires 2  months and as needed  5)  Please contact office for sooner follow up if symptoms do not improve or worsen   Appended Document: med calendar update    Clinical Lists Changes  Medications: Changed medication from OCUVITE   TABS (MULTIPLE VITAMINS-MINERALS) take 2 tabs by mouth two times a day to OCUVITE   TABS (MULTIPLE VITAMINS-MINERALS) take 2 tabs by mouth two times a day Changed medication from ACETAMINOPHEN-CODEINE #3 300-30 MG  TABS (ACETAMINOPHEN-CODEINE) Take 1 tablet by mouth two times a day to TYLENOL EXTRA STRENGTH 500 MG TABS (ACETAMINOPHEN) Take 1 tablet by mouth two times a day Changed medication from PROAIR HFA 108 (90 BASE) MCG/ACT  AERS (ALBUTEROL SULFATE) 1-2 puffs every 4-6 hours as needed to PROAIR HFA 108 (90 BASE) MCG/ACT  AERS (ALBUTEROL SULFATE) 2 puffs every 4 hours as needed Changed medication from COLACE 100 MG CAPS (DOCUSATE SODIUM) Take 1 capsule by mouth once a day to COLACE 100 MG CAPS (DOCUSATE SODIUM) Take 1 capsule by mouth once a day as needed Changed medication from ADVIL COLD/SINUS 30-200 MG TABS (PSEUDOEPHEDRINE-IBUPROFEN) as directed as needed to ADVIL COLD/SINUS 30-200 MG TABS (PSEUDOEPHEDRINE-IBUPROFEN) per bottle as needed Removed medication of PREDNISONE 10 MG  TABS (PREDNISONE) 4 each am x 2days, 2x2days, 1x2days and stop Removed medication of AUGMENTIN 875-125 MG  TABS (AMOXICILLIN-POT CLAVULANATE) By mouth twice daily

## 2011-01-16 NOTE — Progress Notes (Signed)
Summary: Phone note re: pt's wish to change doctors   Phone Note Other Incoming   Caller: Dr. Delford Field Summary of Call: Dr. Delford Field called to speak with Dr. Sherene Sires who is not in the office. Dr. Delford Field got a phone call from Dr. Donnie Aho regarding Leotis Pain and said that Mr. Perreira is not happy with Dr. Thurston Hole care and would like a new pulmonary doctor. Dr. Donnie Aho would like to discuss this with Dr. Sherene Sires, per Dr. Delford Field. I will forward this to Dr. Sherene Sires and ask that he call Dr. Donnie Aho. Initial call taken by: Michel Bickers CMA,  August 09, 2010 10:27 AM  Follow-up for Phone Call        Discussed with Dr Donnie Aho.  I did not know he was seeing a cardiologist nor did  the pt provide Dr Donnie Aho with the detailed list he was given by our NP re his complex medical regimen which says at the top in bold letters "you must show this list to every doctor you see, without it you may not receive quality medical care"  He's is expressing desire to change doctors but since he is non-compliant we have agreed as a group not to pass this problem on to a partner but try to either work this out with the patient or refer to [facility] or one of our nearby medical centers. Call pt and let him know that those are his options but before he see's me again he'll need to agree to see [name] to document his meds are 100% accurate by bringing them all in in two bags. Send copy of this note to Danise Edge and Viann Fish Follow-up by: Nyoka Cowden MD,  August 09, 2010 10:51 AM  Additional Follow-up for Phone Call Additional follow up Details #1::        ATC pt- NA and no option to leave msg,WCB. Vernie Murders  August 09, 2010 11:39 AM  ATC pt - NA and unable to leave message.  WCB Crystal Jones RN  August 11, 2010 10:56 AM      Additional Follow-up for Phone Call Additional follow up Details #2::    Spoke with pt.  informed him of above statement per MW.  At this time, pt states he will continue to see MW  and agrees to see TP first for meds.  ERX scheduled with TP for 8.29.11 at 9am -- pt aware and advised to bring all meds in two seperate bags - prn and schedule.  He verbalized understanding   Follow-up by: Gweneth Dimitri RN,  August 11, 2010 4:41 PM

## 2011-01-16 NOTE — Assessment & Plan Note (Signed)
Summary: rov 4-6 wks w/ tp ///kp   Primary Provider/Referring Provider:  Danise Edge  CC:  4 week follow up.  Breathing improved.  prod cough with clear mucus.  sinus pressure and nasal congestion last night -- took advil cold and sinus with relief.  Would like flu vac today.Barry [name]  History of Present Illness: 65 yowm never smoker with  relatively mild asthma but superimposed on obesity plus a chronically paralyzed left hemidiaphragm and a tendency to upper airway cough syndrome.    05/20/08 change to neb b and b two times a day and doing fine  August 27, 2008 requesting refills of reglan having slt more reflux but only prilosec once daily, no new resp problems or need for rescue rx, rec try off reglan  March 01, 2009 ov doing well on med calendar and neb with b and b until one week prior to ov increase sinus drainage and cough and sob slt yellow mucus.  doe but not at rest.  March 15, 2009 - pt here for med calendar visit and also acutely ill.  Med calendar with few additions.  Pt has no complaints of current med regimen.  Pt has low grade temp of 100.3 today, symptoms began this four days ago, c/o nasal congestion, sore throat, maxillary sinus tenderness, chest congestion, productive cough of light yellow sputum, SOB with exertion, chest tightness, occasional wheezing, ear fullness, HA. rx augmentin no prednisone > better  January 31, 2010 ov  c/o cough x 5 days- prod with clear to yellow sputum.  He also has noticed wheezing and increased SOB with activity.  He states that his chest is sore from cough. states following med calendar prns well.  no lateralizing pleuritic cp.  rx augmentn and prednisone and resolved  May 16, 2010 2 month followup.  Pt states that his cough is the same- still coughs up clear thick sputum daily.  No new complaints today. rx with mucinex dm and pepcid 20 mg on as needed basis but not consistently activating written action plan.   August 14, 2010--Presents for  follow up and med review.  pt brought all meds with him today.  Also he is feeling sick for last 1 week. He complains of  increased SOB, wheezing, chest congestion, dry cough, sinus pressure/congestion and ear pressure x1week. . We reviewed his meds and updated his med calendar. Emphasized the importance of keeping up with his meds. OTC not helping his sinus congestion.>>Dx w/ Acute sinusitis>tx w/ augmentin x 10 days.   September 11, 2010 --Presents for 4 week follow up. 4 week follow up.  Breathing improved.  Has occasion prod cough with clear mucus.  sinus pressure and nasal congestion last night -- took advil cold and sinus with relief.  Would like flu vac today. Last ov w/ sinuslitis tx w/ augmentin  x 10 days. He feels much better. Denies chest pain, dyspnea, orthopnea, hemoptysis, fever, n/v/d, edema, headache.   Current Medications (verified): 1)  Lipitor 40 Mg  Tabs (Atorvastatin Calcium) .... Take 1 Tablet By Mouth Once A Day 2)  Zetia 10 Mg  Tabs (Ezetimibe) .... Take 1 Tablet By Mouth Once A Day 3)  Metoprolol Tartrate 25 Mg  Tabs (Metoprolol Tartrate) .... Take 1/2 Tablet By Mouth in The Morning and 1/2 Tablet By Mouth in The Evening 4)  Folic Acid 1 Mg  Tabs (Folic Acid) .... Take 1 Tablet By Mouth Once A Day 5)  Diltiazem Hcl Cr 180 Mg Xr24h-Cap (Diltiazem Hcl) .... Take 1 Tablet By  Mouth Once A Day 6)  Digoxin 0.125 Mg  Tabs (Digoxin) .... Take 1 Tablet By Mouth Once A Day 7)  Prilosec 20 Mg  Cpdr (Omeprazole) .... Take 1 Tablet By Mouth Once A Day 8)  Bayer Low Strength 81 Mg  Tbec (Aspirin) .... Take 1 Tablet By Mouth Once A Day 9)  Sulfasalazine 500 Mg  Tabs (Sulfasalazine) .... Take 2 Tablets By Mouth Four Times A Day 10)  Cvs Eye Drops 0.05 %  Soln (Tetrahydrozoline Hcl) .... 2 Drops in Each Eye  Two Times A Day 11)  Cvs Glucosamine-Chondroitin   Tabs (Glucosamine-Chondroit-Vit C-Mn) .... Take 1 Tablet By Mouth Two Times A Day 12)  Ocuvite   Tabs (Multiple Vitamins-Minerals) .... Take 2 Tabs By  Mouth Two Times A Day 13)  Tylenol Extra Strength 500 Mg Tabs (Acetaminophen) .... Take 1 Tablet By Mouth Two Times A Day 14)  Pulmicort 0.25 Mg/18ml Susp (Budesonide) .Barry [name].. 1 Vial in Nebulizer Two Times A Day 15)  Brovana 15 Mcg/65ml Nebu (Arformoterol Tartrate) .Barry [name].. 1 Vial in Nebulizer Two Times A Day 16)  Pepcid Ac Maximum Strength 20 Mg Tabs (Famotidine) .Barry [name].. 1 At Bedtime 17)  Mucinex Dm 30-600 Mg  Tb12 (Dextromethorphan-Guaifenesin) .... Take 1 To 2 Tabs By Mouth Every 12 Hours As Needed 18)  Proair Hfa 108 (90 Base) Mcg/act  Aers (Albuterol Sulfate) .... 2 Puffs Every 4 Hours As Needed 19)  Dulcolax 5 Mg Tbec (Bisacodyl) .... Take 1 Tablet By Mouth Once A Day As Needed 20)  Saline Nasal Spray 0.65 % Soln (Saline) .... 2 Puffs Every 4 Hours As Needed 21)  Zyrtec Allergy 10 Mg Tabs (Cetirizine Hcl) .... Take 1 Tab By Mouth At Bedtime As Needed 22)  Advil Cold/sinus 30-200 Mg Tabs (Pseudoephedrine-Ibuprofen) .... Per Bottle As Needed 23)  Augmentin 875-125 Mg Tabs (Amoxicillin-Pot Clavulanate) .Barry [name].. 1 By Mouth Two Times A Day  Allergies (verified): 1)  ! 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  no alcohol single no children  disabled: textiles  Risk  Factors: Smoking Status: never (11/25/2007)  Review of Systems      See HPI  Vital Signs:  Patient profile:   65 year old male Height:      68 inches Weight:      248 pounds BMI:     37.84 O2 Sat:      93 % on Room air Temp:     97.6 degrees F oral Pulse rate:   78 / minute BP sitting:   136 / 74  (left arm) Cuff size:   regular  Vitals Entered By: Gweneth Dimitri RN (September 11, 2010 10:18 AM)  O2 Flow:  Room air CC: 4 week follow up.  Breathing improved.  prod cough with clear mucus.  sinus pressure and nasal congestion last night -- took advil cold  and sinus with relief.  Would like flu vac today. Comments Medications reviewed with patient Daytime contact number verified with patient. Gweneth Dimitri RN  September 11, 2010 10:20 AM    Physical Exam  Additional Exam:  GEN: A/Ox3; pleasant , NAD   wt  250 May 02, 2009 >  251 November 01, 2009 > 248 January 31, 2010 >>251 March 14, 2010 > 248 May 16, 2010 >>248 August 14, 2010 HEENT:  [place]/AT, , EACs-clear, TMs-wnl, NOSE-mild redness , THROAT clear, nontender sinus.  NECK:  Supple w/ fair ROM; no JVD; normal carotid impulses w/o bruits; no thyromegaly or nodules palpated; no lymphadenopathy. RESP  Distant  BS  no wheezing  CARD:  RRR, no m/r/g   GI:   Soft & nt; nml bowel sounds; no organomegaly or masses detected. Musco: Warm bil,  no calf tenderness edema, clubbing, pulses intact     Impression & Recommendations:  Problem # 1:  ACUTE SINUSITIS, UNSPECIFIED (ICD-461.9)  resolved w/ abx.  saline nasal rinses as needed  flu shot today.  follow up 3-4 mon and as needed     His updated medication list for this problem includes:    Mucinex Dm 30-600 Mg Tb12 (Dextromethorphan-guaifenesin) .Barry [name]... Take 1 to 2 tabs by mouth every 12 hours as needed    Saline Nasal Spray 0.65 % Soln (Saline) .Barry [name]... 2 puffs every 4 hours as needed    Advil Cold/sinus 30-200 Mg Tabs (Pseudoephedrine-ibuprofen) .Barry [name]... Per bottle as needed     Augmentin 875-125 Mg Tabs (Amoxicillin-pot clavulanate) .Barry [name]... 1 by mouth two times a day  Orders: Est. Patient Level II (82956)  Medications Added to Medication List This Visit: 1)  Metoprolol Tartrate 25 Mg Tabs (Metoprolol tartrate) .... Take 1/2 tablet by mouth in the morning and 1/2 tablet by mouth in the evening 2)  Pulmicort 0.25 Mg/25ml Susp (Budesonide) .Barry [name].. 1 vial in nebulizer two times a day  Complete Medication List: 1)  Lipitor 40 Mg Tabs (Atorvastatin calcium) .... Take 1 tablet by mouth once a day 2)  Zetia 10 Mg Tabs (Ezetimibe) .... Take 1 tablet by mouth once a day 3)  Metoprolol Tartrate 25 Mg Tabs (Metoprolol tartrate) .... Take 1/2 tablet by mouth in the morning and 1/2 tablet by mouth in the evening 4)  Folic Acid 1 Mg Tabs (Folic acid) .... Take 1 tablet by mouth once a day 5)  Diltiazem Hcl Cr 180 Mg Xr24h-cap (Diltiazem hcl) .... Take 1 tablet by mouth once a day 6)  Digoxin 0.125 Mg Tabs (Digoxin) .... Take 1 tablet by mouth once a day 7)  Prilosec 20 Mg Cpdr (Omeprazole) .... Take 1 tablet by mouth once a day 8)  Bayer Low Strength 81 Mg Tbec (Aspirin) .... Take 1 tablet by mouth once a day 9)  Sulfasalazine 500 Mg Tabs (Sulfasalazine) .... Take 2 tablets by mouth four times a day 10)  Cvs Eye Drops 0.05 % Soln (Tetrahydrozoline hcl) .... 2 drops in each eye  two times a day 11)  Cvs Glucosamine-chondroitin Tabs (Glucosamine-chondroit-vit c-mn) .... Take 1 tablet by mouth two times a day 12)  Ocuvite Tabs (Multiple vitamins-minerals) .... Take 2 tabs by mouth two times a day 13)  Tylenol Extra Strength 500 Mg Tabs (Acetaminophen) .... Take 1 tablet by mouth two times a day 14)  Pulmicort 0.25 Mg/54ml Susp (Budesonide) .Barry [name].. 1 vial in nebulizer two times a day 15)  Brovana 15 Mcg/66ml Nebu (Arformoterol tartrate) .Barry [name].. 1 vial in nebulizer two  times a day 16)  Pepcid Ac Maximum Strength 20 Mg Tabs (Famotidine) .Barry [name].. 1 at bedtime 17)  Mucinex Dm 30-600 Mg Tb12  (Dextromethorphan-guaifenesin) .... Take 1 to 2 tabs by mouth every 12 hours as needed 18)  Proair Hfa 108 (90 Base) Mcg/act Aers (Albuterol sulfate) .... 2 puffs every 4 hours as needed 19)  Dulcolax 5 Mg Tbec (Bisacodyl) .... Take 1 tablet by mouth once a day as needed 20)  Saline Nasal Spray 0.65 % Soln (Saline) .... 2 puffs every 4 hours as needed 21)  Zyrtec Allergy 10 Mg Tabs (Cetirizine hcl) .... Take 1 tab by mouth at bedtime as needed 22)  Advil Cold/sinus 30-200 Mg Tabs (Pseudoephedrine-ibuprofen) .... Per bottle as needed 23)  Augmentin 875-125 Mg Tabs (Amoxicillin-pot clavulanate) .Barry [name].. 1 by mouth two times a day  Other Orders: Flu Vaccine 65yrs + MEDICARE PATIENTS (V4098) Administration Flu vaccine - MCR (J1914)  Patient Instructions: 1)  Flu shot today 2)  Keep up the good work.  3)  follow up in 4 months and as needed       Flu Vaccine Consent Questions     Do you have a history of severe allergic reactions to this vaccine? no    Any prior history of allergic reactions to egg and/or gelatin? no    Do you have a sensitivity to the preservative Thimersol? no    Do you have a past history of Guillan-Barre Syndrome? no    Do you currently have an acute febrile illness? no    Have you ever had a severe reaction to latex? no    Vaccine information given and explained to patient? yes    Are you currently pregnant? no    Lot Number:[id number]   Exp Date:06/16/2011   Site Given  Left Deltoid IMflu  Boone Master CNA/MA  September 11, 2010 11:41 AM

## 2011-01-18 NOTE — Letter (Signed)
Summary: Statement of Medical Necessity / [facility]  Statement of Medical Necessity / [facility]   Imported By: Lennie Odor 01/05/2011 16:32:29  _____________________________________________________________________  External Attachment:    Type:   Image     Comment:   External Document

## 2011-01-18 NOTE — Assessment & Plan Note (Signed)
Summary: NP 4 month follow up visit   Primary Provider/Referring Provider:  Danise Edge  CC:  4 month follow up. Pt states he has had increased sob, chest congestion, cough w/ clear phlem, and wheezing x5 days. [name]  History of Present Illness: 63 yowm never smoker with  relatively mild asthma but superimposed on obesity plus a chronically paralyzed left hemidiaphragm and a tendency to upper airway cough syndrome.    05/20/08 change to neb b and b two times a day and doing fine  August 27, 2008 requesting refills of reglan having slt more reflux but only prilosec once daily, no new resp problems or need for rescue rx, rec try off reglan  March 01, 2009 ov doing well on med calendar and neb with b and b until one week prior to ov increase sinus drainage and cough and sob slt yellow mucus.  doe but not at rest.  March 15, 2009 - pt here for med calendar visit and also acutely ill.  Med calendar with few additions.  Pt has no complaints of current med regimen.  Pt has low grade temp of 100.3 today, symptoms began this four days ago, c/o nasal congestion, sore throat, maxillary sinus tenderness, chest congestion, productive cough of light yellow sputum, SOB with exertion, chest tightness, occasional wheezing, ear fullness, HA. rx augmentin no prednisone > better  January 31, 2010 ov  c/o cough x 5 days- prod with clear to yellow sputum.  He also has noticed wheezing and increased SOB with activity.  He states that his chest is sore from cough. states following med calendar prns well.  no lateralizing pleuritic cp.  rx augmentn and prednisone and resolved  May 16, 2010 2 month followup.  Pt states that his cough is the same- still coughs up clear thick sputum daily.  No new complaints today. rx with mucinex dm and pepcid 20 mg on as needed basis but not consistently activating written action plan.   August 14, 2010--Presents for follow up and med review.  pt brought all meds with him today.  Also  he is feeling sick for last 1 week. He complains of  increased SOB, wheezing, chest congestion, dry cough, sinus pressure/congestion and ear pressure x1week. . We reviewed his meds and updated his med calendar. Emphasized the importance of keeping up with his meds. OTC not helping his sinus congestion.>>Dx w/ Acute sinusitis>tx w/ augmentin x 10 days.   September 11, 2010 --Presents for 4 week follow up.  Breathing improved.  Has occasion prod cough with clear mucus.  sinus pressure and nasal congestion last night -- took advil cold and sinus with relief.  Would like flu vac today. Last ov w/ sinuslitis tx w/ augmentin  x 10 days. He feels much better. Denies chest pain, dyspnea, orthopnea, hemoptysis, fever, n/v/d, edema, headache.   January 10, 2011 --Presents for 4 month follow up. Has been doing well until  last week. Pt states he has had increased sob, chest congestion, cough w/ clear phlem, wheezing x5 days. OTC not helping. Denies chest pain,  orthopnea, hemoptysis, fever, n/v/d, edema, headache.  Last steroids 04/2010.  Current Medications (verified): 1)  Lipitor 40 Mg  Tabs (Atorvastatin Calcium) .... Take 1 Tablet By Mouth Once A Day 2)  Zetia 10 Mg  Tabs (Ezetimibe) .... Take 1 Tablet By Mouth Once A Day 3)  Metoprolol Tartrate 25 Mg  Tabs (Metoprolol Tartrate) .... Take 1/2 Tablet By Mouth in The Morning and 1/2 Tablet By  Mouth in The Evening 4)  Folic Acid 1 Mg  Tabs (Folic Acid) .... Take 1 Tablet By Mouth Once A Day 5)  Diltiazem Hcl Cr 180 Mg Xr24h-Cap (Diltiazem Hcl) .... Take 1 Tablet By Mouth Once A Day 6)  Digoxin 0.125 Mg  Tabs (Digoxin) .... Take 1 Tablet By Mouth Once A Day 7)  Prilosec 20 Mg  Cpdr (Omeprazole) .... Take 1 Tablet By Mouth Once A Day 8)  Bayer Low Strength 81 Mg  Tbec (Aspirin) .... Take 1 Tablet By Mouth Once A Day 9)  Sulfasalazine 500 Mg  Tabs (Sulfasalazine) .... Take 2 Tablets By Mouth Four Times A Day 10)  Cvs Eye Drops 0.05 %  Soln (Tetrahydrozoline Hcl)  .... 2 Drops in Each Eye  Two Times A Day 11)  Cvs Glucosamine-Chondroitin   Tabs (Glucosamine-Chondroit-Vit C-Mn) .... Take 1 Tablet By Mouth Two Times A Day 12)  Ocuvite   Tabs (Multiple Vitamins-Minerals) .... Take 2 Tabs By Mouth Two Times A Day 13)  Tylenol Extra Strength 500 Mg Tabs (Acetaminophen) .... Take 1 Tablet By Mouth Two Times A Day 14)  Pulmicort 0.25 Mg/41ml Susp (Budesonide) .[name].. 1 Vial in Nebulizer Two Times A Day 15)  Brovana 15 Mcg/79ml Nebu (Arformoterol Tartrate) .[name].. 1 Vial in Nebulizer Two Times A Day 16)  Pepcid Ac Maximum Strength 20 Mg Tabs (Famotidine) .[name].. 1 At Bedtime 17)  Mucinex Dm 30-600 Mg  Tb12 (Dextromethorphan-Guaifenesin) .... Take 1 To 2 Tabs By Mouth Every 12 Hours As Needed 18)  Proair Hfa 108 (90 Base) Mcg/act  Aers (Albuterol Sulfate) .... 2 Puffs Every 4 Hours As Needed 19)  Dulcolax 5 Mg Tbec (Bisacodyl) .... Take 1 Tablet By Mouth Once A Day As Needed 20)  Saline Nasal Spray 0.65 % Soln (Saline) .... 2 Puffs Every 4 Hours As Needed 21)  Zyrtec Allergy 10 Mg Tabs (Cetirizine Hcl) .... Take 1 Tab By Mouth At Bedtime As Needed 22)  Advil Cold/sinus 30-200 Mg Tabs (Pseudoephedrine-Ibuprofen) .... Per Bottle As Needed 23)  Augmentin 875-125 Mg Tabs (Amoxicillin-Pot Clavulanate) .[name].. 1 By Mouth Two Times A Day  Allergies (verified): 1)  ! 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 Pt states he has had increased sob, chest congestion, cough w/ clear phlem, wheezing x5 days.  Comments meds and allergies updated Phone number updated Carver Fila  January 10, 2011 10:17 AM    Physical Exam  Additional Exam:  GEN: A/Ox3; pleasant , NAD   wt  250 May 02, 2009 >  251 November 01, 2009 > 248 January 31, 2010 >>251 March 14, 2010 > 248 May 16, 2010 >>248 August 14, 2010>>245 January 10, 2011  HEENT:  [place]/AT, , EACs-clear, TMs-wnl, NOSE-mild redness , THROAT clear NECK:  Supple w/ fair ROM; no JVD; normal carotid impulses w/o bruits; no thyromegaly or nodules palpated; no lymphadenopathy. RESP  Distant  BS  no wheezing  CARD:  RRR, no m/r/g   GI:   Soft & nt; nml bowel sounds; no organomegaly or masses detected. Musco: Warm bil,  no calf tenderness edema, clubbing, pulses intact     Impression & Recommendations:  Problem # 1:  ASTHMA (ICD-493.90)  Exacerbation Plan:  Avelox 400mg  once daily for 7 days Mucinex DM two times a day as needed cough/congestion  Prednisone taper over next week.  Please contact office for sooner follow up if symptoms do not improve or worsen  follow up Dr. Sherene Sires in 4 months  and as needed   Orders: Est. Patient Level IV (16109)  Medications Added to Medication List This Visit: 1)  Avelox 400 Mg Tabs (Moxifloxacin hcl)  .[name].. 1 by mouth once daily 2)  Prednisone 10 Mg Tabs (Prednisone) .... 4 tabs for 2 days, then 3 tabs for 2 days, 2 tabs for 2 days, then 1 tab for 2 days, then stop  Patient Instructions: 1)  Avelox 400mg  once daily for 7 days 2)  Mucinex DM two times a day as needed cough/congestion  3)  Prednisone taper over next week.  4)  Please contact office for sooner follow up if symptoms do not improve or worsen  5)  follow up Dr. Sherene Sires in 4 months  and as needed  Prescriptions: PREDNISONE 10 MG TABS (PREDNISONE) 4 tabs for 2 days, then 3 tabs for 2 days, 2 tabs for 2 days, then 1 tab for 2 days, then stop  #20 x 0   Entered and Authorized by:   Rubye Oaks NP   Signed by:   Johneric Mcfadden NP on 01/10/2011   Method used:   Electronically to        [id number] (retail)       [address]       [place], [place]  [address]       Ph: [phone] or [phone]       Fax: [phone]   RxID:   [id number] AVELOX 400 MG TABS (MOXIFLOXACIN HCL) 1 by mouth once daily  #7 x 0   Entered and Authorized by:   Rubye Oaks NP   Signed by:   Nguyen Butler NP on 01/10/2011   Method used:   Electronically to        [facility] (retail[facility]       [address]       [place], [place]  [address]       Ph: [phone] or [phone]       Fax: [phone]   RxID:   [id number]

## 2011-01-18 NOTE — Letter (Signed)
Summary: Statement of Medical Necessity / [facility]  Statement of Medical Necessity / [facility]   Imported By: Lennie Odor 01/08/2011 11:52:58  _____________________________________________________________________  External Attachment:    Type:   Image     Comment:   External Document

## 2011-01-22 ENCOUNTER — Ambulatory Visit (INDEPENDENT_AMBULATORY_CARE_PROVIDER_SITE_OTHER): Payer: Medicare Other | Admitting: Adult Health

## 2011-01-22 ENCOUNTER — Ambulatory Visit (INDEPENDENT_AMBULATORY_CARE_PROVIDER_SITE_OTHER)
Admission: RE | Admit: 2011-01-22 | Discharge: 2011-01-22 | Disposition: A | Discharge status: Home / Self Care | Payer: Medicare Other | Source: Ambulatory Visit | Attending: Internal Medicine | Admitting: Internal Medicine

## 2011-01-22 ENCOUNTER — Encounter: Payer: Self-pay | Admitting: Adult Health

## 2011-01-22 ENCOUNTER — Other Ambulatory Visit: Payer: Self-pay | Admitting: Internal Medicine

## 2011-01-22 DIAGNOSIS — J209 Acute bronchitis, unspecified: Secondary | ICD-10-CM

## 2011-01-22 HISTORY — DX: Acute bronchitis, unspecified: J20.9

## 2011-02-01 NOTE — Assessment & Plan Note (Signed)
Summary: NP follow up - asthma   Primary Provider/Referring Provider:  Danise Edge  CC:  still having prod cough with clear mucus, wheezing, SOB, and some tightness in chest - states is no better since finishing the avelox and pred taper.  denies f/c/s.  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    05/20/08 change to neb b and b two times a day and doing fine  August 27, 2008 requesting refills of reglan having slt more reflux but only prilosec once daily, no new resp problems or need for rescue rx, rec try off reglan  March 01, 2009 ov doing well on med calendar and neb with b and b until one week prior to ov increase sinus drainage and cough and sob slt yellow mucus.  doe but not at rest.  March 15, 2009 - pt here for med calendar visit and also acutely ill.  Med calendar with few additions.  Pt has no complaints of current med regimen.  Pt has low grade temp of 100.3 today, symptoms began this four days ago, c/o nasal congestion, sore throat, maxillary sinus tenderness, chest congestion, productive cough of light yellow sputum, SOB with exertion, chest tightness, occasional wheezing, ear fullness, HA. rx augmentin no prednisone > better  January 31, 2010 ov  c/o cough x 5 days- prod with clear to yellow sputum.  He also has noticed wheezing and increased SOB with activity.  He states that his chest is sore from cough. states following med calendar prns well.  no lateralizing pleuritic cp.  rx augmentn and prednisone and resolved  May 16, 2010 2 month followup.  Pt states that his cough is the same- still coughs up clear thick sputum daily.  No new complaints today. rx with mucinex dm and pepcid 20 mg on as needed basis but not consistently activating written action plan.   August 14, 2010--Presents for follow up and med review.  pt  brought all meds with him today.  Also he is feeling sick for last 1 week. He complains of  increased SOB, wheezing, chest congestion, dry cough, sinus pressure/congestion and ear pressure x1week. . We reviewed his meds and updated his med calendar. Emphasized the importance of keeping up with his meds. OTC not helping his sinus congestion.>>Dx w/ Acute sinusitis>tx w/ augmentin x 10 days.   September 11, 2010 --Presents for 4 week follow up.  Breathing improved.  Has occasion prod cough with clear mucus.  sinus pressure and nasal congestion last night -- took advil cold and sinus with relief.  Would like flu vac today. Last ov w/ sinuslitis tx w/ augmentin  x 10 days. He feels much better. Denies chest pain, dyspnea, orthopnea, hemoptysis, fever, n/v/d, edema, headache.   January 10, 2011 --Presents for 4 month follow up. Has been doing well until  last week. Pt states he has had increased sob, chest congestion, cough w/ clear phlem, wheezing x5 days. OTC not helping. Denies chest pain,  orthopnea, hemoptysis, fever, n/v/d, edema, headache.  Last steroids 04/2010.  01/22/11--Returns for persistent symptoms of coughand wheezing. Complains of productive cough with clear mucus, wheezing, SOB, some tightness in chest - states is no better since finishing the avelox and pred taper.  Last ov with asthmatic bronchitis flare tx with Avelox and steroid taper.Says congestion is clear now but still has cough and wheezing.  No edema or chest pain. Denies chest pain, orthopnea, hemoptysis, fever, n/v/d, edema, headache.   Medications Prior to Update: 1)  Lipitor 40 Mg  Tabs (Atorvastatin Calcium) .... Take 1 Tablet By Mouth Once A Day 2)  Zetia 10 Mg  Tabs (Ezetimibe) .... Take 1 Tablet By Mouth Once A Day 3)  Metoprolol Tartrate 25 Mg  Tabs (Metoprolol Tartrate) .... Take 1/2 Tablet By Mouth in The Morning and 1/2 Tablet By Mouth in The Evening 4)  Folic Acid 1 Mg  Tabs (Folic Acid) .... Take 1 Tablet By Mouth Once A  Day 5)  Diltiazem Hcl Cr 180 Mg Xr24h-Cap (Diltiazem Hcl) .... Take 1 Tablet By Mouth Once A Day 6)  Digoxin 0.125 Mg  Tabs (Digoxin) .... Take 1 Tablet By Mouth Once A Day 7)  Prilosec 20 Mg  Cpdr (Omeprazole) .... Take 1 Tablet By Mouth Once A Day 8)  Bayer Low Strength 81 Mg  Tbec (Aspirin) .... Take 1 Tablet By Mouth Once A Day 9)  Sulfasalazine 500 Mg  Tabs (Sulfasalazine) .... Take 2 Tablets By Mouth Four Times A Day 10)  [facility]vs Eye Drops 0.05 %  Soln (Tetrahydrozoline Hcl) .... 2 Drops in Each Eye  Two Times A Day 11)  Cvs Glucosamine-Chondroitin   Tabs (Glucosamine-Chondroit-Vit C-Mn) .... Take 1 Tablet By Mouth Two Times A Day 12)  Ocuvite   Tabs (Multiple Vitamins-Minerals) .... Take 2 Tabs By Mouth Two Times A Day 13)  Tylenol Extra Strength 500 Mg Tabs (Acetaminophen) .... Take 1 Tablet By Mouth Two Times A Day 14)  Pulmicort 0.25 Mg/34ml Susp (Budesonide) .[name].. 1 Vial in Nebulizer Two Times A Day 15)  Brovana 15 Mcg/53ml Nebu (Arformoterol Tartrate) .[name].. 1 Vial in Nebulizer Two Times A Day 16)  Pepcid Ac Maximum Strength 20 Mg Tabs (Famotidine) .[name].. 1 At Bedtime 17)  Mucinex Dm 30-600 Mg  Tb12 (Dextromethorphan-Guaifenesin) .... Take 1 To 2 Tabs By Mouth Every 12 Hours As Needed 18)  Proair Hfa 108 (90 Base) Mcg/act  Aers (Albuterol Sulfate) .... 2 Puffs Every 4 Hours As Needed 19)  Dulcolax 5 Mg Tbec (Bisacodyl) .... Take 1 Tablet By Mouth Once A Day As Needed 20)  Saline Nasal Spray 0.65 % Soln (Saline) .... 2 Puffs Every 4 Hours As Needed 21)  Zyrtec Allergy 10 Mg Tabs (Cetirizine Hcl) .... Take 1 Tab By Mouth At Bedtime As Needed 22)  Advil Cold/sinus 30-200 Mg Tabs (Pseudoephedrine-Ibuprofen) .... Per Bottle As Needed 23)  Augmentin 875-125 Mg Tabs (Amoxicillin-Pot Clavulanate) .[name].. 1 By Mouth Two Times A Day 24)  Avelox 400 Mg Tabs (Moxifloxacin Hcl) .[name].. 1 By Mouth Once Daily 25)  Prednisone 10 Mg Tabs (Prednisone) .... 4 Tabs For 2 Days, Then 3 Tabs For 2 Days, 2 Tabs For 2 Days,  Then 1 Tab For 2 Days, Then Stop  Current Medications (verified): 1)  Lipitor 40 Mg  Tabs (Atorvastatin Calcium) .... Take 1 Tablet By Mouth Once A Day 2)  Zetia 10 Mg  Tabs (Ezetimibe) .... Take 1 Tablet By Mouth Once A Day 3)  Metoprolol Tartrate 25 Mg  Tabs (Metoprolol Tartrate) .... Take 1/2 Tablet By Mouth in The Morning and 1/2 Tablet By Mouth in The Evening 4)  Folic Acid 1 Mg  Tabs (Folic Acid) .... Take 1 Tablet By Mouth Once A Day 5)  Diltiazem Hcl Cr 180 Mg Xr24h-Cap (Diltiazem Hcl) .... Take 1 Tablet By Mouth Once A Day 6)  Digoxin 0.125 Mg  Tabs (  Digoxin) .... Take 1 Tablet By Mouth Once A Day 7)  Prilosec 20 Mg  Cpdr (Omeprazole) .... Take 1 Tablet By Mouth Once A Day 8)  Bayer Low Strength 81 Mg  Tbec (Aspirin) .... Take 1 Tablet By Mouth Once A Day 9)  Sulfasalazine 500 Mg  Tabs (Sulfasalazine) .... Take 2 Tablets By Mouth Four Times A Day 10)  Cvs Eye Drops 0.05 %  Soln (Tetrahydrozoline Hcl) .... 2 Drops in Each Eye  Two Times A Day 11)  Cvs Glucosamine-Chondroitin   Tabs (Glucosamine-Chondroit-Vit C-Mn) .... Take 1 Tablet By Mouth Two Times A Day 12)  Ocuvite   Tabs (Multiple Vitamins-Minerals) .... Take 2 Tabs By Mouth Two Times A Day 13)  Tylenol Extra Strength 500 Mg Tabs (Acetaminophen) .... Take 1 Tablet By Mouth Two Times A Day 14)  Pulmicort 0.25 Mg/48ml Susp (Budesonide) .[name].. 1 Vial in Nebulizer Two Times A Day 15)  Brovana 15 Mcg/35ml Nebu (Arformoterol Tartrate) .[name].. 1 Vial in Nebulizer Two Times A Day 16)  Pepcid Ac Maximum Strength 20 Mg Tabs (Famotidine) .[name].. 1 At Bedtime 17)  Mucinex Dm 30-600 Mg  Tb12 (Dextromethorphan-Guaifenesin) .... Take 1 To 2 Tabs By Mouth Every 12 Hours As Needed 18)  Proair Hfa 108 (90 Base) Mcg/act  Aers (Albuterol Sulfate) .... 2 Puffs Every 4 Hours As Needed 19)  Dulcolax 5 Mg Tbec (Bisacodyl) .... Take 1 Tablet By Mouth Once A Day As Needed 20)  Saline Nasal Spray 0.65 % Soln (Saline) .... 2 Puffs Every 4 Hours As Needed 21)  Zyrtec  Allergy 10 Mg Tabs (Cetirizine Hcl) .... Take 1 Tab By Mouth At Bedtime As Needed 22)  Advil Cold/sinus 30-200 Mg Tabs (Pseudoephedrine-Ibuprofen) .... Per Bottle As Needed  Allergies (verified): 1)  ! 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  no alcohol single no children  disabled: textiles  Risk Factors: Smoking Status: never (11/25/2007)  Review of Systems      See HPI  Vital Signs:  Patient profile:   65 year old male Height:      68 inches Weight:      248.13 pounds BMI:     37.86 O2 Sat:      94 % on Room air Temp:     97.1 degrees F oral Pulse rate:   77 / minute BP sitting:   124 / 66  (left arm) Cuff size:   large  Vitals Entered By: Boone Master CNA/MA (January 22, 2011 12:00 PM)  O2 Flow:  Room air CC: still having prod cough with clear mucus, wheezing, SOB, some tightness in chest - states is no better since finishing the avelox and pred taper.  denies f/c/s Comments Medications reviewed with patient Daytime contact number verified with patient. Boone Master CNA/MA  January 22, 2011 11:59 AM  Physical Exam  Additional Exam:  GEN: A/Ox3; pleasant , NAD   wt  250 May 02, 2009 >  251 November 01, 2009 > 248 January 31, 2010 >>251 March 14, 2010 > 248 May 16, 2010 >>248 August 14, 2010>>245 January 10, 2011 >>248 01/22/11 HEENT:  [place]/AT, , EACs-clear, TMs-wnl, NOSE-mild redness , THROAT clear NECK:  Supple w/ fair ROM; no JVD; normal carotid impulses w/o bruits; no thyromegaly or nodules palpated; no lymphadenopathy. RESP  Distant  BS  no wheezing  CARD:  RRR, no m/r/g   GI:   Soft & nt; nml bowel sounds; no organomegaly or masses detected. Musco: Warm bil,  no calf tenderness edema, clubbing, pulses intact     Impression & Recommendations:  Problem # 1:  BRONCHITIS, ACUTE (ICD-466.0)  Slow to resolve flare  xray today with no acute process Plan: Mucinex DM two times a day as needed cough/congestion  Prednisone taper over next week.  Hydromet 1-2 tsp every 4-6 hrs as needed cough-may make you sleepy.  Saline nasal rinses as needed  Increase fluids and rest  Tyelnol as needed  Please contact office for sooner follow up if symptoms do not improve or worsen  follow up Dr. Sherene Sires in 4 months  and as needed  The following medications were removed from the medication list:    Augmentin 875-125 Mg Tabs (Amoxicillin-pot clavulanate) .[name]... 1 by mouth two times a day    Avelox 400 Mg Tabs (Moxifloxacin hcl) .[name]... 1 by mouth once daily His updated medication list for this problem includes:    Pulmicort 0.25 Mg/44ml Susp (Budesonide) .[name]... 1 vial in nebulizer two times a day    Brovana 15 Mcg/5ml Nebu (Arformoterol tartrate) .[name]... 1 vial in nebulizer two times a day    Mucinex Dm 30-600 Mg Tb12 (Dextromethorphan-guaifenesin) .[name]... Take 1 to 2 tabs by mouth every 12 hours as needed    Proair Hfa 108 (90 Base) Mcg/act Aers (Albuterol sulfate) .[name]... 2 puffs every 4 hours as needed    Advil Cold/sinus 30-200 Mg Tabs (Pseudoephedrine-ibuprofen) .[name]... Per bottle as needed    Hydromet 5-1.5 Mg/87ml Syrp (Hydrocodone-homatropine) .[name]... 1-2 tsp every 4-6 hrs as needed cough  Orders: T-2 View CXR (71020TC) Est. Patient  Level IV (81191)  Medications Added to Medication List This Visit: 1)  Prednisone 10 Mg Tabs (Prednisone) .... 4 tabs for 3 days, then 3 tabs for 3 days, 2 tabs for 3 days, then 1 tab for 3 days, then stop 2)  Hydromet 5-1.5 Mg/34ml Syrp (Hydrocodone-homatropine) .[name].. 1-2 tsp every 4-6 hrs as needed cough  Patient Instructions: 1)  Mucinex DM two times a day as needed cough/congestion  2)  Prednisone taper over next week.  3)  Hydromet 1-2 tsp every 4-6 hrs as needed cough-may make you sleepy.  4)  Saline nasal rinses as needed  5)  Increase fluids and rest  6)  Tyelnol as needed  7)  Please contact office for sooner follow up if symptoms do not improve or worsen  8)  follow up Dr. Sherene Sires in 4 months  and as needed  Prescriptions: HYDROMET 5-1.5 MG/5ML SYRP (HYDROCODONE-HOMATROPINE) 1-2 tsp every 4-6 hrs as needed cough  #8 oz x 0    Entered and Authorized by:   Rubye Oaks NP   Signed by:   [name] Parrett NP on 01/22/2011   Method used:   Print then Give to Patient   RxID:   [id number] PREDNISONE 10 MG TABS (PREDNISONE) 4  tabs for 3 days, then 3 tabs for 3 days, 2 tabs for 3 days, then 1 tab for 3 days, then stop  #30 x 0   Entered and Authorized by:   Rubye Oaks NP   Signed by:   [name] Parrett NP on 01/22/2011   Method used:   Electronically to        [facility] (ret[facility]       [address]       [place], [place]  [address]       Ph: [phone] or [phone]       Fax: [phone]   RxID:   [id number]

## 2011-04-02 ENCOUNTER — Encounter: Payer: Self-pay | Admitting: Adult Health

## 2011-04-02 ENCOUNTER — Ambulatory Visit (INDEPENDENT_AMBULATORY_CARE_PROVIDER_SITE_OTHER): Payer: Medicare Other | Admitting: Adult Health

## 2011-04-02 VITALS — BP 140/82 | HR 93 | Temp 100.4°F | Ht 66.0 in | Wt 246.0 lb

## 2011-04-02 DIAGNOSIS — J209 Acute bronchitis, unspecified: Secondary | ICD-10-CM

## 2011-04-02 MED ORDER — ALBUTEROL SULFATE HFA 108 (90 BASE) MCG/ACT IN AERS: 2.0000 | INHALATION_SPRAY | Freq: Four times a day (QID) | RESPIRATORY_TRACT | Status: DC | PRN

## 2011-04-02 MED ORDER — HYDROCODONE-HOMATROPINE 5-1.5 MG/5ML PO SYRP: 5.0000 mL | ORAL_SOLUTION | Freq: Four times a day (QID) | ORAL | Status: AC | PRN

## 2011-04-02 MED ORDER — CEFDINIR 300 MG PO CAPS: 300.0000 mg | ORAL_CAPSULE | Freq: Two times a day (BID) | ORAL | Status: AC

## 2011-04-02 NOTE — Assessment & Plan Note (Addendum)
Flare  Plan:  Omnicef 300mg  Twice daily  For 10 days  Mucinex DM Twice daily  As needed  Cough/congesiton  Fluids and rest  Tylenol As needed   Saline nasal rinses As needed   Hydromet As needed  For cough, may make you sleepy.  Please contact office for sooner follow up if symptoms do not improve or worsen or seek emergency care   follow up 2 months Dr. Sherene Sires

## 2011-04-02 NOTE — Progress Notes (Signed)
  Subjective:    Patient ID: Barry [name], male    DOB: 1946-04-15, 65 y.o.   MRN: [id number]  HPI 27 yowm never smoker with relatively mild asthma but superimposed on obesity plus a chronically paralyzed left hemidiaphragm and a tendency to upper airway cough syndrome.    January 10, 2011 --Presents for 4 month follow up. Has been doing well until last week. Pt states he has had increased sob, chest congestion, cough w/ clear phlem, wheezing x5 days. OTC not helping. << tx w/ abx and steroid   01/22/11--Returns for persistent symptoms of coughand wheezing. Complains of productive cough with clear mucus, wheezing, SOB, some tightness in chest - states is no better since finishing the avelox and pred taper. Last ov with asthmatic bronchitis flare tx with Avelox and steroid taper.Says congestion is clear now but still has cough and wheezing. No edema or chest pain.  >>tx w/  Steroid taper  04/02/2011 Acute Office visit.  Complains of cough, congestion, drainage, low grade fever, sinus pain and pressure. Coughing up green mucus. Began over last week. Took mucinex without much help. Seen 2 months ago with bronchitis his symptoms totally resolved until last week.  Feel bad all over, no energy.     Review of Systems Constitutional:   No  weight loss, night sweats,     HEENT:   No headaches,  Difficulty swallowing,  Tooth/dental problems, or  Sore throat,               +sneezing, itching, ear ache, nasal congestion, post nasal drip,   CV:  No chest pain,  Orthopnea, PND, swelling in lower extremities, anasarca, dizziness, palpitations, syncope.   GI  No heartburn, indigestion, abdominal pain, nausea, vomiting, diarrhea, change in bowel habits, loss of appetite, bloody stools.   Resp:   No wheezing.  No chest wall deformity  Skin: no rash or lesions.  GU: no dysuria, change in color of urine, no urgency or frequency.  No flank pain, no hematuria   MS:  No joint pain or swelling.  No decreased  range of motion.  No back pain.  Psych:  No change in mood or affect. No depression or anxiety.  No memory loss.          Objective:   Physical Exam GEN: A/Ox3; pleasant , NAD, well nourished   HEENT:  [place]/AT,  EACs-clear, TMs-wnl, NOSE-clear drainage, sinus pain/pressure "max tendernees. , THROAT-clear, no lesions, no postnasal drip or exudate noted. Class 3 airway  NECK:  Supple w/ fair ROM; no JVD; normal carotid impulses w/o bruits; no thyromegaly or nodules palpated; no lymphadenopathy.  RESP  Coarse BS w/ no wheezing, dimished in bases.    CARD:  RRR, no m/r/g  , no peripheral edema, pulses intact, no cyanosis or clubbing.  GI:   Soft & nt; nml bowel sounds; no organomegaly or masses detected.  Musco: Warm bil, walks with cane   Neuro: alert, no focal deficits noted.    Skin: Warm, no lesions or rashes         Assessment & Plan:

## 2011-04-02 NOTE — Patient Instructions (Addendum)
Omnicef 300mg  Twice daily  For 10 days  Mucinex DM Twice daily  As needed  Cough/congesiton  Fluids and rest  Tylenol As needed   Saline nasal rinses As needed   Hydromet As needed  For cough, may make you sleepy.  Please contact office for sooner follow up if symptoms do not improve or worsen or seek emergency care   follow up 2 months Dr. Sherene Sires

## 2011-04-04 ENCOUNTER — Other Ambulatory Visit: Payer: Self-pay | Admitting: *Deleted

## 2011-04-04 NOTE — Telephone Encounter (Signed)
Encounter created in error

## 2011-05-01 NOTE — Assessment & Plan Note (Signed)
[facility]                             PULMONARY OFFICE NOTE   NAME:WRAYWynton, Hufstetler                        MRN:          [id number]  DATE:11/11/2007                            DOB:          [birth year]    HISTORY OF PRESENT ILLNESS:  The patient is a 65 year old white male  patient of Dr. Thurston Hole who has a known history of chronically-paralyzed  left hemidiaphragm, morbid obesity and ulcerative colitis and chronic  cough, who presents today for a two-week followup.  The patient on his  last visit was felt to have some pseudo-asthma and was started on an  aggressive reflux prevention regimen with metoclopramide 5 mg three time  daily with meals and at bedtime, along with Prilosec daily.  The patient  was also recommended he could use Symbicort 160/4.5 mg, one to two puffs  q.12h. as needed and a prednisone taper.  The patient returns today and  reports that he is substantially improved, with decreased cough and  shortness of breath and feels that he is back to his baseline.  The  patient denies any purulent sputum, fever, chest pain, orthopnea, PND or  leg swelling.  The patient was supposed to have brought all of his  medications in today for review; however, misunderstood the instructions  and did not bring those in.   PAST MEDICAL HISTORY:  Is reviewed.   PHYSICAL EXAMINATION:  GENERAL:  The patient is a pleasant male, in no  acute distress.  VITAL SIGNS:  He is afebrile with stable vital signs.  O2 saturation is  94% on room air.  HEENT:  Posterior pharynx clear.  NECK:  Supple without cervical adenopathy.  No jugular venous  distention.  LUNGS:  Sounds are clear without any wheezing or pseudo-wheezes.  HEART:  A regular rate.  ABDOMEN:  Soft, nontender.  EXTREMITIES:  Warm without any edema.   IMPRESSION/PLAN:  1. Chronic cough with recent flare:  The patient is much improved      after a steroid taper and aggressive reflux prevention with     metoclopramide and Prilosec.  The patient will continue on his      present regimen.  He has the option to use Symbicort on an as-      needed basis.  2. Complex medication regimen:  The patient's medication list was      reviewed with the patient.      The patient is advised to bring his medications all in to the      office in two weeks for a medication calendar and update.      Rubye Oaks, NP  Electronically Signed      Charlaine Dalton. Sherene Sires, MD, [facility]  Electronically Signed   TP/MedQ  DD: 11/11/2007  DT: 11/11/2007  Job #: [id number]

## 2011-05-01 NOTE — Assessment & Plan Note (Signed)
[facility]                             PULMONARY OFFICE NOTE   NAME:Barry [name]                        MRN:          [id number]  DATE:09/16/2007                            DOB:          June 03, 1946    HISTORY OF PRESENT ILLNESS:  The patient is a 65 year old white male  patient of Dr. Thurston [name] who has a known history of kyphoscoliosis and a  chronically paralyzed left hemidiaphragm.  He presents to the office  today for a four day history of  nasal congestion, cough with thick  mucous, sore throat and general malaise.  The patient denies any  hemoptysis, orthopnea, PND or increased leg swelling.  The patient has  not been seen in the office in greater than 9 months reporting that he  has had no respiratory flares since his last visit.   PAST MEDICAL HISTORY:  Was reviewed with the patient.  Unfortunately,  the chart was unavailable for today's visit.   CURRENT MEDICATIONS:  Reviewed with patient, chart not available for  today's visit.   PHYSICAL EXAMINATION:  GENERAL APPEARANCE:  The patient is an elderly  male in no acute distress.  VITAL SIGNS:  He is afebrile.  Oxygen saturation is 95% on room air.  HEENT:  His nasal mucosa is pale.  Nontender sinuses.  Posterior pharynx  is clear.  NECK:  Supple without adenopathy.  No jugular venous distention.  LUNGS:  Sounds reveal diminished breath sounds at the bases, otherwise  clear.  CARDIAC:  Regular rate.  ABDOMEN:  Soft, nontender.  EXTREMITIES:  Warm with trace edema.   IMPRESSION AND PLAN:  Acute upper respiratory tract infection.  Patient  is to begin Omnicef x7 days.  Mucinex DM twice daily.  Tussionex,  dispense 4 ounces, one teaspoon every 12 hours as needed for cough.  The  patient is aware of sedating effect.  Patient is given a Xopenex  nebulizer treatment today in the office.  The patient is to return back  with Dr. Sherene [name] in one month or sooner if needed.      Rubye Oaks, NP  Electronically Signed      Barry [name]. Barry Sires, MD, [facility]  Electronically Signed   TP/MedQ  DD: 09/16/2007  DT: 09/17/2007  Job #: [id number]

## 2011-05-01 NOTE — Assessment & Plan Note (Signed)
[facility]                             PULMONARY OFFICE NOTE   Barry [name], Barry [name]                        MRN:          [id number]  DATE:10/28/2007                            DOB:          1946-01-06    PULMONARY/EXTENDED FOLLOWUP OFFICE VISIT   HISTORY:  A 65 year old white male with a chronically paralyzed left  hemidiaphragm, morbid obesity (with a peak weight of 269 obtained in  July 2001), had a history of ulcerative colitis for which he sees Dr.  Danise Edge.  I last saw this patient in 2006 with evidence of mostly  pseudoasthma and restrictive change with no need for albuterol and no  significant symptoms.  At that point he weighed 235.   He has gained weight since that time, now up to 247, and complains of  increasing dyspnea and cough over the last several weeks that comes and  goes.  He did seem better after a short course of prednisone in  September.  He had been using albuterol frequently but ran out a month  ago and has not used it at all, says he needs that too.  Interestingly,  he has no significant pattern to the wheezing and cough in terms of no  nocturnal exacerbation, fevers, chills, sweats, purulent sputum, overt  sinus or reflux symptoms.   For full inventory of medications please see face sheet column dated  October 28, 2007, noting that he is no longer on b.i.d. PPI nor Reglan,  which was the case when he left here previously.   PHYSICAL EXAMINATION:  He is a depressed-appearing obese white male in  no acute distress.  He weighs 247 pounds, which is up almost 25 pounds  from his last visit to see me in December 2006.  HEENT:  Unremarkable, oropharynx clear.  LUNG FIELDS:  Reveal pseudowheeze only.  There is a regular rate and rhythm without murmur, gallop or rub.  ABDOMEN:  Obese but benign.  EXTREMITIES:  Warm without calf tenderness, cyanosis, clubbing, or  edema.   Hemoglobin saturation 96% on room air.   PFTs  were reviewed from June 2006 indicating an FEV-1/FVC ratio of 68%  with abnormal contour suggesting pseudoasthma.   IMPRESSION:  1. No definite evidence of asthma, though certainly he is at risk,      especially if he has active reflux.  Rather than treat him      aggressively for asthma I am going to treat more aggressively for      reflux on a trial basis and then bring him in 2 weeks for      medication reconciliation purposes.  Specially, I recommended      metoclopramide 10-mg tablets one-half before meals and at bedtime.  2. Rather than using albuterol like he was using it before, I am going      to switch him over to Symbicort 160/4.5 and use it one to two puffs      q.12h. (an unconventional rescue medication but certainly      preferable to over-using albuterol which has been the history, and  used safely this way in [place] for years), emphasizing to him that      Symbicort is only to used for rescue purposes and not for      maintenance purposes and that on this basis should not need any      albuterol.  3. Finally, I also introduced him to a short-term course of      prednisone and asked him to conceive of all of his medicines in      three ways:  Maintenance, p.r.n. and short-course therapy so that      we can help him organize his medicines in a more user-friendly,      unambiguous medication calendar when he returns.     Charlaine Dalton. Sherene Sires, MD, [facility]  Electronically Signed    MBW/MedQ  DD: 10/28/2007  DT: 10/29/2007  Job #: [id number]   cc:   Danise Edge, M.D.

## 2011-05-04 NOTE — Consult Note (Signed)
[place]. [facility]  Patient:    Barry [name]                           MRN: [id number] Proc. Date: 07/14/00 Adm. Date:  [id number] Attending:  Dennison Bulla Ii CC:         Verlin Grills, M.D.             Wayne C. Dorna Bloom, M.D.                          Consultation Report  REASON FOR CONSULTATION:  Dr. Dorna Bloom asked me to see this 65 year old male at 6:15 this evening for evaluation of chest discomfort and abnormal cardiac enzymes.   HISTORY OF PRESENT ILLNESS:  The patient is a 65 year old white male who has a prior history of ulcerative colitis, depression and asthma.  He has severe arthritis, also.  He has a prior history of a left nephrectomy.  He is on disability because of his ulcerative colitis.  He awoke this morning and then had the onset of mid sternal chest discomfort associated with sweating and shortness of breath, which was severe.  It got transiently better when he sat down, but then recurred and EMS was called.  The emergency room record suggested inferior injury, and the ER note notes a 12-lead EKG showed ST elevation in the inferior leads.  The patient was given aspirin and morphine, which resulted in diminution of his pain somewhat.  He was seen at the emergency room later by Dr. ______ at 11:15.  He had some hypertension due to nitroglycerin.  I was in the emergency room with another patient, walking down the hall taking him to the catheterization laboratory for intervention and Dr. ______ asked about a man that had come in with chest discomfort which had resolved and had not had EKG changes.  I suggested that he ask the [place] [facility] group to see, which was their usual practice.  I later received a call from Dr. Dorna Bloom at [facility] saying that he had admitted the patient earlier and that he came back with abnormal cardiac enzymes and had some worsening of the pain.  The chest discomfort the patient described again this morning  and has been present all day, but had diminished after two nitroglycerin to a lower grade level, but was still a 2-3/6.  It intensified somewhat this afternoon.  The patient was not started on heparin earlier, but had been given aspirin.  He has continued to have the discomfort, but is not in any acute distress at the present time.  PAST MEDICAL HISTORY:  Negative for hypertension or high cholesterol.  There is a possibility of elevated sugars in the past.  Ulcerative colitis as noted above with periodic exacerbations requiring steroids.  The last exacerbation was around 9-10 months ago.  Significant arthritis also noted.  PAST SURGICAL HISTORY:  Left kidney removed for infection.  He also has a history of appendectomy.  ALLERGIES:  FELDENE causes skin rash.  CURRENT MEDICATIONS:  Azulfidine, folic acid, Azmacort, nortriptyline for depression, Tylenol #3.  SOCIAL HISTORY:  The patient has never been married.  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  He does not smoke or use alcohol to excess.  He has no children.  FAMILY HISTORY:  His father died of a heart attack  at the age of 52.  His mother had heart trouble and high blood pressure.  A sister died at a young age, but there is no premature family history of heart disease.  REVIEW OF SYSTEMS:  He has noted occasional spotting or flecks of blood occasionally, but no gross bleeding recently.  No recent major surgery.  He is somewhat inactive.  He has never had chest discomfort.  He has some mild shortness of breath.  He has no definite PND or orthopnea.  He has significant arthritis and is limited in what he is able to do.  PHYSICAL EXAMINATION:  GENERAL:  Short, obese male with a thick bull neck.  VITAL SIGNS:  Blood pressure 130/80, pulse 76.  SKIN:  Warm and dry.  No diaphoresis.  HEENT:  Grossly normal.  He is wearing oxygen.  NECK:  He has a very thick  neck and it is difficult to evaluate JVD or thyromegaly.  LUNGS:  Clear bilaterally.  CARDIOVASCULAR:  Normal S1 and S2.  No S3 or murmur.  ABDOMEN:  Soft, obese and nontender.  EXTREMITIES:  Femoral pulses 2+.  Pedal pulses 2+.  LABORATORY DATA:  Hemoglobin 12.7, hematocrit 36.4.  BUN 29 with creatinine 1. Initial CPK was normal, but follow up CPK shows an MB of approximately 70 with total CPK 362.  Review of EKGs showed the initial ECG in the emergency room with findings of 1-2 mm of st elevation in the inferior leads with ST depression in the anterolateral leads, V2 and lead I.  This was also confirmed on the EKG transmitted from [facility].  EKG done tonight at 1755 shows inferiorly MI with Q waves present in the inferior leads and ongoing St segment depression in I, aVL and V2 through V3.  IMPRESSION: 1. Inferior myocardial infarction with the time of onset approximately 7:00    this morning, approximately 12 hours ago, with ongoing chest discomfort.    He has been started on heparin now in the past hour, and is also on IV    nitroglycerin, but continues to have symptoms. 2. Ulcerative colitis. 3. Asthma by history. 4. Severe osteoarthritis. 5. History of depression. 6. History of nephrectomy with normal creatinine at this time.  RECOMMENDATIONS:  The patient came into the hospital this morning with an acute inferior myocardial infarction.  Within the past hour, he has been started on heparin.  He has previously been given aspirin.  He has ongoing chest discomfort and ongoing ST elevation and, because of the presence of ongoing chest pain despite heparin and IV nitroglycerin, have recommended acute catheterization for relief of pain and to see if there is any salvage although, at this late date, the likelihood of salvage would be small.  I discussed the catheterization with the patient fully, and he is agreeable and willing to proceed.  His only  known relative is a first cousin. DD:  07/14/00 TD:  07/16/00 Job: [id number]

## 2011-05-04 NOTE — Cardiovascular Report (Signed)
[place]. [facility]  Patient:    Barry [name], Barry [name]                          MRN: [id number] Proc. Date: 07/14/00 Adm. Date:  [id number] Attending:  Dennison Bulla Ii CC:         Verlin Grills, M.D.             Wayne C. Dorna Bloom, M.D.                        Cardiac Catheterization  HISTORY:  A 65 year old male who has ulcerative colitis and presented to the emergency room with acute chest pain that started around 7:00 a.m. this morning shortly after arising.  He was initially brought in by ambulance and has a slight ST elevation, and EKG in the emergency room showed some ST elevation which was unrecognized.  He was admitted to the floor and had some partial relief of pain with nitroglycerin but continues to have chest discomfort all day.  I was called at 6:15 by the primary care physician who asked me to see the patient.  The patient is having continued chest pain and had changes of an inferior infarct and is brought to the catheterization lab for acute intervention in the face of chest discomfort with ST elevation that has been unrelieved with heparin and nitroglycerin.  PROCEDURE:  Emergency left heart catheterization, coronary angiography, left ventriculography and angioplasty of the right coronary artery.  DESCRIPTION OF PROCEDURE:  The patient was in the hospital and was brought to the catheterization lab and was prepped and draped in the usual manner.  After Xylocaine anesthesia, a 7-French sheath was placed in the right femoral artery percutaneously.  Angiograms were made using 6-French catheters.  A 30-cc ventriculogram was performed.  A 6-French venous sheath was placed in the right femoral vein.  Integrilin was begun with bolus and constant infusion. Heparin, 5000 units, was given achieving an ACT of 271.  Angioplasty equipment used was a 7-French JR4 catheter with sideholes, HTF J guide wire which crossed the lesion with moderate difficulty.   The wire went down the posterior descending artery, and the initial balloon inflation was done with a 2.0 x 15-mm CrossSail.  Because of the patients obesity, balloon positioning was difficult but it was adequate.  The lesion opened, and the balloon was upgraded to a 2.25 CrossSail dilated to 14 atmospheres to equal 2.41.  He became pain-free following this and did not have any significant reperfusion arrhythmias.  The sheaths were sutured in place, and he was returned to the CCU in stable condition.  HEMODYNAMIC DATA:  Aorta post contrast was 123/78.  LV post contrast was 123/18-20.  ANGIOGRAPHIC DATA:  Left ventriculogram:  Performed in the 30-degree RAO projection.  The aortic valve was normal.  The mitral valve was normal.  The left ventricle was normal in size.  There is inferior wall hypokinesis with an estimated ejection fraction of 55%.  Coronary arteries arise and distribute normally.  Moderate calcification involving the left coronary system. 1. The left main coronary artery has mild distal narrowing. 2. The left anterior descending artery has a segmental 60-70% proximal    stenosis. 3. The diagonal branch appears to have an ostial 80-90% stenosis. 4. The circumflex coronary artery has a severe eccentric 90% stenosis    followed by a segmental 40-50% stenosis which is a large vessel.  The    second marginal branch is sub totally occluded and fills through collateral    filling through the circumflex.  There is also collateral filling of the    distal right coronary artery seen from the left coronary system. 5. The right coronary artery has a proximal 40% stenosis.  The acute marginal    branch has a severe 99% stenosis at its origin.  The vessel is then    occluded distally, and collaterals fill as seen from the left coronary    system.  Post dilatation angiograms of the right coronary artery reveal a widely patent vessel with TIMI-3 blood flow.  There is less than 30%  residual stenosis in the site of the previous total occlusion.  IMPRESSION: 1. Successful PTCA of the right coronary artery in the setting of an acute    infarction with stenosis going from 100% to less than 30%. 2. Acute inferior infarction with late reperfusion. 3. Significant three-vessel coronary artery disease.  RECOMMENDATIONS:  Recover from infarction.  Consideration of bypass grafting. DD:  07/14/00 TD:  07/15/00 Job: [id number]

## 2011-05-04 NOTE — Discharge Summary (Signed)
[place]. [facility]  Patient:    Barry [name], Barry [name]                          MRN: [id number] Adm. Date:  [id number] Disc. Date: 07/29/00 Attending:  Charlett Lango Dictator:   Lissa Merlin, P.A. CC:         CVTS office  Verlin Grills, M.D.  Darden Palmer., M.D.  Charlaine Dalton. Sherene [name], M.D. [facility]   Discharge Summary  DATE OF BIRTH:  [birth year]  ADMISSION DIAGNOSIS:  Chest pain, subsequently ruled in for inferior myocardial infarction.  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  DISCHARGE DIAGNOSES: 1. Status post emergent cardiac cath with PTCA of the right coronary artery on    July 14, 2000, by Dr. Donnie [name].  This revealed severe three vessel coronary    artery disease, ejection fraction 50%, and inferior hypokinesis. 2. Status post CABG x 6 on July 18, 2000, by Dr. Dorris Fetch with the    following grafts:  LIMA to LAD, saphenous vein graft sequentially from OM1    to OM2, saphenous vein graft sequentially from PD to PL, saphenous vein    graft to second diagonal. 3. Rapid atrial fibrillation/flutter, postoperative day #2, treated with    Digoxin, Lopressor, and Diltiazem, subsequently converted to normal sinus    rhythm, postoperative day #4. 4. Anemia, transfused, improved. 5. Deconditioning, working with cardiac rehab, improving. 6. Slow postoperative recovery of respiratory function, treated with hand    held nebulizers, incentive spirometry, and diuresis, now on room air.  BRIEF HISTORY:  Barry [name] is a 65 year old white male, single, never married, who lives alone.  He is disabled secondary to ulcerative colitis.  He initially presented to the emergency room on July 14, 2000, after awaking that morning with severe chest pain, shortness of breath, and diaphoresis.  He was ruled in with abnormal enzymes and EKG changes for inferior  MI.  He was taken to the cath lab by Dr. Donnie [name], where he had PTCA of a totally occluded right coronary artery.  He was stabilized with IV heparin, nitroglycerin, and morphine.  CVTS consultation was ordered.  Dr. Orson Aloe evaluated Barry [name] and concluded CABG was indeed best treatment.  Risks, benefits, details, and alternatives of surgery were discussed with Barry [name] and it was agreed to proceed.  Surgery took place on July 18, 2000.  There were no complications. He was transferred to SICU in stable condition.  Because of Barry [name] history of asthmatic bronchitis, pulmonology was consulted prior to surgery.  He was cleared for surgery.  Afterward, Barry [name] remained in SICU to watch his respiratory function.  He was otherwise doing well.  Postoperative day #2 he developed rapid atrial fibrillation and was treated with Digoxin, Lopressor, and Diltiazem.  He converted back to normal sinus rhythm spontaneously on postoperative day #4.  He was briefly anticoagulated with Coumadin and this was later discontinued.  He had postoperative anemia and was transfused for hemoglobin of 7, hematocrit of 21.  This subsequently improved.  He was deemed suitable for transfer to unit 2000 on July 22, 2000.  On unit 2000 it was apparent that Barry [name] was deconditioned.  He put forth good effort working with cardiac rehab phase 1, but responded slowly to walking.  Also another main issue was his respiratory function.  This was slow to improve.  He had much wheezing in the beginning.  He never was in respiratory distress, but it was apparent that he needed further work on his lungs.  This was treated with hand held nebulizers, incentive spirometry, and diuresis.  Because of Barry [name] deconditioning and pulmonary function combined with his living alone with minimal assistance, Barry [name] was prepared for probable [facility] discharge. By July 28, 2000, Barry [name] is doing quite well.  His vital signs are stable. Low  dose beta blocker has been added to address asymptomatic sinus tachycardia with rate previously around 120.  This was probably due to his frequent nebulizer therapy.  His rate has responded nicely.  His lungs are doing much better.  He is walking on room air with good O2 saturations.  He is observed ambulating in the hallway with cardiac rehab and appears stronger and more stable.  It is anticipated he will be ready for discharge to [facility] pending satisfactory morning rounds Monday, July 29, 2000.  MEDICATIONS:  Please see medication administration record.  ALLERGIES:  FELDENE causes skin rash.  SPECIAL INSTRUCTIONS:  WOUND CARE:  Observe wounds for signs of infection.  Cleanse daily with mild soap and water.  FOLLOW-UP:  Barry [name] will need three leg sutures in his proximal thigh, discontinued around August 01, 2000.  CVTS will follow Barry [name] on SACU unit. Other follow-up per cardiology.  CONDITION:  Barry [name] is stable and improved. DD:  07/28/00 TD:  07/28/00 Job: [id number]

## 2011-05-04 NOTE — H&P (Signed)
[place]. [facility]  Patient:    Barry [name], Barry [name]                          MRN: [id number] Adm. Date:  [id number] Attending:  Dennison Bulla Ii CC:         Verlin Grills, M.D.                         History and Physical  CHIEF COMPLAINT:  Chest pain.  HISTORY OF PRESENT ILLNESS:  This 65 year old white male woke up with 8/10 left parasternal chest pain associated with diaphoresis and radiation to both arms this morning.  Patient had cough prior to this and afterwards.  Positive pain with moving, improved with sitting, but then worsened lying down, at which point he called EMS.  EMS gave him four baby aspirins and 4 mg of morphine, which brought the pain down to about 6-7/10.  Sublingual nitroglycerin in the ER brought the pain down to 4/10 and patient is much more comfortable.  PAST MEDICAL HISTORY:  Asthma, reflux disease, ulcerative colitis, glaucoma, and arthritis.  PAST SURGICAL HISTORY:  Possible left nephrectomy, but evidently had a nephrectomy somewhere, and an appendectomy.  SOCIAL HISTORY:  Patient is single, disabled, and unemployed.  FAMILY HISTORY:  Mother had some type of cancer, also kidney disease in mother, positive CVD in the mother; also, positive cardiovascular disease in the father.  HABITS:  He denies tobacco, alcohol, or drugs.  REVIEW OF SYSTEMS:  Patient has occasional blood in the stools secondary to ulcerative colitis and evidently is prone to GI bleed.  Does have a lot of joint pain secondary to arthritis.  MEDICATIONS: 1. Azulfidine 500 mg two q.i.d. 2. Tylenol No. 3 one b.i.d. 3. Folic acid 1 mg a day. 4. Nortriptyline 20 mg q.h.s. 5. Ocuvite one a day. 6. Azmacort two puffs q.i.d.  ALLERGIES:  FELDENE - causes a rash.  PHYSICAL EXAMINATION:  GENERAL:  Patient looks comfortable.  VITAL SIGNS:  Blood pressure 130/80, pulse 78, respiratory rate 18, temperature 97.9.  Patient is quite obese.  HEENT:   Okay.  Neck supple with no cervical lymphadenopathy, no carotid bruit, no JVD, no thyromegaly.  Funduscopic exam looks benign.  Oropharynx is clear.  CARDIOVASCULAR:  Regular rate and rhythm without murmurs, rubs, or gallops.  LUNGS:  Clear to auscultation bilaterally.  ABDOMEN:  Soft with active bowel sounds, nontender, obese.  EXTREMITIES:  No clubbing, cyanosis, or edema.  Patient does have bilateral hammertoes.  NEUROLOGIC:  Nonfocal.  RECTAL:  Heme negative.  GENITOURINARY:  Patient has normal bilaterally descended testes.  LABORATORY DATA:  EKG shows normal sinus rhythm with ST compression 1 mm in V2, otherwise normal EKG.  Chest x-ray shows borderline cardiomegaly with no active disease.  White count 4.0, H&H 12.7 and 36.4.  Glucose 159.  CK 152, MB 2.2, relative index 1.4, troponin less than 0.03.  Creatinine 1.1.  Of note, patients left parasternal chest pain is quite reproducible to palpation exquisitely.  ASSESSMENT AND PLAN: 1. Chest pain, atypical, questionable ischemic.  Give IV nitroglycerin and    aspirin, serial enzymes and EKG.  Will hold off on anticoagulation    secondary to patient is prone to GI bleed until we are convinced that    this is truly cardiac in nature.  Patient should have cardiac evaluation    in the morning, i.e. a Cardiolite in  a.m. 2. Questionable reflux disease.  Start Protonix 40 mg a day.  Patient reports    heart burn. 3. Questionable musculoskeletal pain after cough, reproduced on palpation. 4. Ulcerative colitis.  Continue azulfidine. 5. Arthritis.  Continue Tylenol No. 3 b.i.d. 6. Asthma.  Continue Azmacort and p.r.n. albuterol. DD:  07/14/00 TD:  07/15/00 Job: [id number]

## 2011-05-04 NOTE — Op Note (Signed)
   NAME:  [name], [name] NO.:  [id number]   MEDICAL RECORD NO.:  [id number]                   PATIENT TYPE:  AMB   LOCATION:  ENDO                                 FACILITY:  [facility]   PHYSICIAN:  Danise Edge, M.D.                DATE OF BIRTH:  March 24, 1946   DATE OF PROCEDURE:  09/24/2002  DATE OF DISCHARGE:                                 OPERATIVE REPORT   REFERRING PHYSICIAN:  Georga Hacking, M.D., Charlaine Dalton. Sherene Sires, M.D.   PROCEDURE INDICATION:  Barry [name] is a 65 year old male born February 28, 1946.  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  He underwent a  diagnostic colonoscopy to make this diagnosis in November 1987.  Barry [name] is  scheduled for his first screening colonoscopy with polypectomy to prevent  colon cancer.   ENDOSCOPIST:  Danise Edge, M.D.   PREMEDICATION:  Fentanyl 50 mcg, Versed 5 mg.   ENDOSCOPE:  Olympus pediatric colonoscope.   PROCEDURE:  After obtaining informed consent, Barry [name] was placed in the  left lateral decubitus position.  I administered intravenous fentanyl and  intravenous Versed to achieve conscious sedation for the procedure.  The  patient's blood pressure, oxygen saturation, and cardiac rhythm were  monitored throughout the procedure and documented in the medical record.   Anal inspection was normal.  Digital rectal exam was normal.  The Olympus  pediatric videocolonoscope was introduced into the rectum and advanced to  the cecum.  Colonic preparation with exam today was excellent.   RECTUM AND SIGMOID COLON:  There is mild, generalized proctosigmioditis,  characterized by viable erythematous mucosa with loss in the mucosal  vascular pattern.  From the rectum, three inflammatory-appearing polyps were  removed with the electrocautery snare and submitted for pathologic  interpretation.   DESCENDING COLON:  Normal.   SPLENIC FLEXURE:  Normal.   TRANSVERSE COLON:  Normal.   HEPATIC  FLEXURE:  Normal.   ASCENDING COLON:  From the proximal ascending colon, a 5 mm sessile polyp  was removed with electrocautery snare.   CECUM AND ILEOCECAL VALVE:  Normal.   PATHOLOGY:  Model #1:  Eight right colon biopsies.  Model #2:  Ascending colon polyp.  Model #3:  Eight transverse colon biopsies.  Model #4:  Eight descending colon biopsies.  Model #5:  Rectosigmoid colon biopsies (eight).  Model #6:  Inflammatory rectal polyps.                                               Danise Edge, M.D.    MJ/MEDQ  D:  09/25/2002  T:  09/26/2002  Job:  [id number]

## 2011-05-04 NOTE — Discharge Summary (Signed)
[place]  Patient:    Barry [name], Barry [name]                          MRN: [id number] Adm. Date:  [id number] Disc. Date: [id number] Attending:  Charlett Lango Dictator:   Lissa Merlin, P.A.                           Discharge Summary  DATE OF BIRTH:  2046/06/14  ADDENDUM:  Barry [name] was prepared for discharge to the [facility] on July 29, 2000. However, at that time, there were no beds available.  Barry [name] continued to convalesce on unit 2000, doing very well.  He has continued to make progress. His vital signs have remained stable.  His respiratory function has improved. He has gotten steadily stronger.  His incisions are healing well.  His lower extremity edema has decreased.  He is making good progress with rehabilitation.  At this time, it is felt that he is stable for discharge home.  Home care arrangements have been made, which include home health physical therapy, occupational therapy, and home health nurse.  He will also have a walker for home use.  DISCHARGE MEDICATIONS: 1. Toprol XL 25 mg one p.o. q.d. 2. Protonix 40 mg one p.o. q.12h. 3. Advair 500/50 one puff q.12h. 4. Cardizem CD 180 mg one p.o. q.d. 5. Digoxin 0.125 mg one p.o. q.d. 6. Sulfasalazine 500 mg two tablets four times a day for his ulcerative    colitis. 7. Nortriptyline 10 mg two p.o. q.h.s. 8. Tylenol No. 3 one to two p.o. q.4-6h. p.r.n. for pain.  FOLLOW-UP:  Barry [name] will arrange a two-weeks appointment with W. Viann Fish, Montez Hageman., M.D., where he will get a chest x-ray.  The [facility] office will call Barry [name] regarding a three-week appointment with Salvatore Decent. Dorris Fetch, M.D.  DISPOSITION:  Pending satisfactory morning rounds, Barry [name] will be discharged home on August 01, 2000. DD:  07/31/00 TD:  08/01/00 Job: [id number]

## 2011-05-04 NOTE — Assessment & Plan Note (Signed)
[facility]                             PULMONARY OFFICE NOTE   NAME:WRAYAthanasius, Kesling                        MRN:          [id number]  DATE:12/23/2006                            DOB:          17-Jun-1946    HISTORY OF PRESENT ILLNESS:  The patient is a 65 year old white male  patient of Dr. Thurston Hole, who has a known history of kyphoscoliosis and  chronically paralyzed left hemidiaphragm, presents for an acute office  visit.  The patient complains of a 1-week history of productive cough  with thick yellow-green sputum and sore throat.  The patient has not  been seen in greater than a year, and fortunately he has been doing well  up until this last week.  The patient denies any hemoptysis, orthopnea,  PND or leg swelling.   PAST MEDICAL HISTORY:  Reviewed.   CURRENT MEDICATIONS:  Reviewed.   PHYSICAL EXAMINATION:  The patient is afebrile with stable vital signs.  O2 saturation is 95% on room air.  HEENT:  Nasal mucosa reveals some mild erythema.  Nontender sinuses to  percussion.  NECK:  Supple without adenopathy.  LUNGS:  Lung sounds are clear.  CARDIAC:  Regular rate.  ABDOMEN:  Soft and obese.  EXTREMITIES:  Warm without any edema.   IMPRESSION AND PLAN:  Acute upper respiratory infection.  The patient is  to begin Omnicef x7 days.  Use Mucinex DM twice daily.  Endal HD #8  ounces 1 to 2 teaspoons every four to six hours as needed for cough.  The patient is aware of sedating effect.  The patient is to return here  in 6 to 8 weeks with Dr. Sherene Sires, or sooner if needed.      Rubye Oaks, NP  Electronically Signed      Charlaine Dalton. Sherene Sires, MD, [facility]  Electronically Signed   TP/MedQ  DD: 12/24/2006  DT: 12/24/2006  Job #: [id number]

## 2011-06-06 ENCOUNTER — Ambulatory Visit (INDEPENDENT_AMBULATORY_CARE_PROVIDER_SITE_OTHER): Payer: Medicare Other | Admitting: Internal Medicine

## 2011-06-06 ENCOUNTER — Encounter: Payer: Self-pay | Admitting: Internal Medicine

## 2011-06-06 DIAGNOSIS — J45909 Unspecified asthma, uncomplicated: Secondary | ICD-10-CM

## 2011-06-06 DIAGNOSIS — E669 Obesity, unspecified: Secondary | ICD-10-CM

## 2011-06-06 MED ORDER — PREDNISONE (PAK) 10 MG PO TABS: ORAL_TABLET | ORAL | Status: AC

## 2011-06-06 NOTE — Patient Instructions (Signed)
Make sure you take your nebulizer first thing in am  Prednisone 10 mg take  4 each am x 2 days,   2 each am x 2 days,  1 each am x2days and stop   For nasal congestion, drainage, allergies, sneezing  Try zyrtec at bedtime and if can't tolerate it then clariton 10 mg every 24 hours and call if not satisfied.  Please schedule a follow up visit in 3 months but call sooner if needed   See calendar for specific medication instructions and bring it back for each and every office visit for every healthcare provider you see.  Without it,  you may not receive the best quality medical care that we feel you deserve.  You will note that the calendar groups together  your maintenance  medications that are timed at particular times of the day.  Think of this as your checklist for what your doctor has instructed you to do until your next evaluation to see what benefit  there is  to staying on a consistent group of medications intended to keep you well.  The other group at the bottom is entirely up to you to use as you see fit  for specific symptoms that may arise between visits that require you to treat them on an as needed basis.  Think of this as your action plan or "what if" list.   Separating the top medications from the bottom group is fundamental to providing you adequate care going forward.

## 2011-06-06 NOTE — Progress Notes (Signed)
Subjective:    Patient ID: Barry [name], male    DOB: 03-24-1946, 65 y.o.   MRN: [id number]  HPI 7 yowm never smoker with relatively mild asthma but superimposed on obesity plus a chronically paralyzed left hemidiaphragm and a tendency to upper airway cough syndrome.    January 10, 2011 --Presents for 4 month follow up. Has been doing well until last week. Pt states he has had increased sob, chest congestion, cough w/ clear phlem, wheezing x5 days. OTC not helping. << tx w/ abx and steroid   01/22/11--Returns for persistent symptoms of coughand wheezing. Complains of productive cough with clear mucus, wheezing, SOB, some tightness in chest - states is no better since finishing the avelox and pred taper. Last ov with asthmatic bronchitis flare tx with Avelox and steroid taper.Says congestion is clear now but still has cough and wheezing. No edema or chest pain.  >>tx w/  Steroid taper  04/02/2011 Acute Office visit/ Np.  Complains of cough, congestion, drainage, low grade fever, sinus pain and pressure. Coughing up green mucus. Began over last week. Took mucinex without much help. Seen 2 months ago with bronchitis his symptoms totally resolved until last week.  Feel bad all over, no energy.  rec Omnicef 300mg  Twice daily  For 10 days  Mucinex DM Twice daily  As needed  Cough/congesiton  Fluids and rest  Tylenol As needed   Saline nasal rinses As needed   Hydromet As needed  For cough, may make you sleepy  06/06/2011 ov/ Tytan Sandate  Transiently better p last ov then worse x one week assoc with sinus congestion worst p stirring  In am but not disturbing sleep.  Pt denies any significant sore throat, dysphagia, itching, sneezing,  excess/ purulent nasal secretions,  fever, chills, sweats, unintended wt loss, pleuritic or exertional cp, hempoptysis, orthopnea pnd or leg swelling.    Also denies any obvious fluctuation of symptoms with weather or environmental changes or other aggravating or alleviating  factors.        Allergies    1) ! 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  no alcohol  single  no children  disabled: textiles         Objective:   Physical Exam GEN: A/Ox3; pleasant , NAD, well nourished. Very awkward gait  Wt  233 06/06/11   HEENT:  [place]/AT,  EACs-clear, TMs-wnl, NOSE-clear drainage, sinus pain/pressure "max tendernees. , THROAT-clear, no lesions, no postnasal drip or exudate noted. Class 3 airway  NECK:  Supple w/ fair ROM; no JVD; normal carotid impulses w/o bruits; no thyromegaly or nodules palpated; no lymphadenopathy.  RESP  Coarse BS w/ no wheezing, dimished in bases.    CARD:  RRR, no m/r/g  , no peripheral edema, pulses intact, no cyanosis or clubbing.  GI:   Soft & nt; nml bowel sounds; no organomegaly or masses detected.  Musco: Warm  bil, walks with cane   Neuro: alert, no focal deficits noted.    Skin: Warm, no lesions or rashes         Assessment & Plan:

## 2011-06-09 ENCOUNTER — Encounter: Payer: Self-pay | Admitting: Internal Medicine

## 2011-06-09 NOTE — Assessment & Plan Note (Addendum)
DDX of  difficult airways managment all start with A and  include Adherence, Ace Inhibitors, Acid Reflux, Active Sinus Disease, Alpha 1 Antitripsin deficiency, Anxiety masquerading as Airways dz,  ABPA,  allergy(esp in young), Aspiration (esp in elderly), Adverse effects of DPI,  Active smokers, plus two Bs  = Bronchiectasis and Beta blocker use..and one C= CHF  Adherence is always the initial "prime suspect" and is a multilayered concern that requires a "trust but verify" approach in every patient - starting with knowing how to use medications, especially inhalers, correctly, keeping up with refills and understanding the fundamental difference between maintenance and prns vs those medications only taken for a very short course and then stopped and not refilled. Despite having calendar he doesn't yet understand how to use it effectively, for example, although breathing/cough worse p stirs in am doesn't get around to using his am maint rx until much later in the am > suggested he take it immediately (reviewed with him line by line, column by column today.  ? Acid reflux:  Diet/ meds reviewed   See instructions for specific recommendations which were reviewed directly with the patient who was given a copy with highlighter outlining the key components.

## 2011-06-09 NOTE — Assessment & Plan Note (Signed)
Important reversible factor in pt with a chronically paralyzed L Hemidiaphragm

## 2011-08-28 ENCOUNTER — Encounter: Payer: Self-pay | Admitting: Internal Medicine

## 2011-08-28 ENCOUNTER — Ambulatory Visit (INDEPENDENT_AMBULATORY_CARE_PROVIDER_SITE_OTHER): Payer: Medicare Other | Admitting: Internal Medicine

## 2011-08-28 VITALS — BP 140/80 | HR 93 | Temp 98.2°F | Ht 64.0 in | Wt 244.2 lb

## 2011-08-28 DIAGNOSIS — J45909 Unspecified asthma, uncomplicated: Secondary | ICD-10-CM

## 2011-08-28 DIAGNOSIS — J310 Chronic rhinitis: Secondary | ICD-10-CM

## 2011-08-28 DIAGNOSIS — K5190 Ulcerative colitis, unspecified, without complications: Secondary | ICD-10-CM

## 2011-08-28 DIAGNOSIS — K219 Gastro-esophageal reflux disease without esophagitis: Secondary | ICD-10-CM

## 2011-08-28 MED ORDER — CEFDINIR 300 MG PO CAPS: 300.0000 mg | ORAL_CAPSULE | Freq: Two times a day (BID) | ORAL | Status: AC

## 2011-08-28 NOTE — Patient Instructions (Signed)
Omnicef 300 mg one twice daily x 10 days  See calendar for specific medication instructions and bring it back for each and every office visit for every healthcare provider you see.  Without it,  you may not receive the best quality medical care that we feel you deserve.  You will note that the calendar groups together  your maintenance  medications that are timed at particular times of the day.  Think of this as your checklist for what your doctor has instructed you to do until your next evaluation to see what benefit  there is  to staying on a consistent group of medications intended to keep you well.  The other group at the bottom is entirely up to you to use as you see fit  for specific symptoms that may arise between visits that require you to treat them on an as needed basis.  Think of this as your action plan or "what if" list.   Separating the top medications from the bottom group is fundamental to providing you adequate care going forward.    Please schedule a follow up visit in 3 months but call sooner if needed

## 2011-08-28 NOTE — Progress Notes (Signed)
Subjective:    Patient ID: Barry [name], male    DOB: August 28, 1946, 65 y.o.   MRN: [id number]  HPI 25 yowm never smoker with relatively mild asthma but superimposed on obesity plus a chronically paralyzed left hemidiaphragm and a tendency to upper airway cough syndrome.    January 10, 2011 --Presents for 4 month follow up. Has been doing well until last week. Pt states he has had increased sob, chest congestion, cough w/ clear phlem, wheezing x5 days. OTC not helping. << tx w/ abx and steroid   01/22/11--Returns for persistent symptoms of coughand wheezing. Complains of productive cough with clear mucus, wheezing, SOB, some tightness in chest - states is no better since finishing the avelox and pred taper. Last ov with asthmatic bronchitis flare tx with Avelox and steroid taper.Says congestion is clear now but still has cough and wheezing. No edema or chest pain.  >>tx w/  Steroid taper  04/02/2011 Acute Office visit/ Np.  Complains of cough, congestion, drainage, low grade fever, sinus pain and pressure. Coughing up green mucus. Began over last week. Took mucinex without much help. Seen 2 months ago with bronchitis his symptoms totally resolved until last week.  Feel bad all over, no energy.  rec Omnicef 300mg  Twice daily  For 10 days  Mucinex DM Twice daily  As needed  Cough/congesiton  Fluids and rest  Tylenol As needed   Saline nasal rinses As needed   Hydromet As needed  For cough, may make you sleepy  06/06/2011 ov/ [name]  Transiently better p last ov then worse x one week assoc with sinus congestion worst p stirring  In am but not disturbing sleep.  recMake sure you take your nebulizer first thing in am  Prednisone 10 mg take  4 each am x 2 days,   2 each am x 2 days,  1 each am x2days and stop   For nasal congestion, drainage, allergies, sneezing  Try zyrtec at bedtime and if can't tolerate it then clariton 10 mg every 24 hours and call if not satisfied.  Please schedule a follow up  visit in 3 months but call sooner if needed     08/28/2011 f/u ov/Barry [name] cc cough Worse x 4 days, prod with green sputum assoc with nasal congestion but no need for daytime saba or increased sob   Sleeping ok without nocturnal  or early am exacerbation  of respiratory  c/o's or need for noct saba. Also denies any obvious fluctuation of symptoms with weather or environmental changes or other aggravating or alleviating factors except as outlined above     Pt denies any significant sore throat, dysphagia, itching, sneezing,  excess/ purulent nasal secretions,  fever, chills, sweats, unintended wt loss, pleuritic or exertional cp, hempoptysis, orthopnea pnd or leg swelling.    Also denies any obvious fluctuation of symptoms with weather or environmental changes or other aggravating or alleviating factors.        Allergies    1) ! 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  no alcohol  single  no children  disabled: textiles         Objective:   Physical Exam GEN: A/Ox3; pleasant , NAD, well nourished. Very awkward gait  Wt  233 06/06/11  > 244 08/28/2011   HEENT:  [place]/AT,  EACs-clear, TMs-wnl, NOSE-clear drainage, sinus pain/pressure "max tendernees. , THROAT-clear, no lesions, no postnasal drip or exudate noted. Class 3 airway  NECK:  Supple w/  fair ROM; no JVD; normal carotid impulses w/o bruits; no thyromegaly or nodules palpated; no lymphadenopathy.  RESP  Coarse BS w/ no wheezing, dimished in bases.    CARD:  RRR, no m/r/g  , no peripheral edema, pulses intact, no cyanosis or clubbing.  GI:   Soft & nt; nml bowel sounds; no organomegaly or masses detected.  Musco: Warm bil, walks with cane   Neuro: alert, no focal deficits noted.    Skin: Warm, no lesions or rashes         Assessment & Plan:

## 2011-08-28 NOTE — Assessment & Plan Note (Signed)
   Each maintenance medication was reviewed in detail including most importantly the difference between maintenance and as needed and under what circumstances the prns are to be used.  This was done in the context of a medication calendar review which provided the patient with a user-friendly unambiguous mechanism for medication administration and reconciliation and provides an action plan for all active problems. It is critical that this be shown to every doctor  for modification during the office visit if necessary so the patient can use it as a working document.   The proper method of use, as well as anticipated side effects, of this metered-dose inhaler are discussed and demonstrated to the patient. Improved to 75% with coaching.  Asthma rules reviewed.

## 2011-08-28 NOTE — Assessment & Plan Note (Signed)
Acute worsening with purulent sputum  Rx omnicef.   Reviewed action plan from calendar.

## 2011-12-05 ENCOUNTER — Encounter: Payer: Self-pay | Admitting: Internal Medicine

## 2011-12-05 ENCOUNTER — Ambulatory Visit (INDEPENDENT_AMBULATORY_CARE_PROVIDER_SITE_OTHER): Payer: Medicare Other | Admitting: Internal Medicine

## 2011-12-05 VITALS — BP 124/64 | HR 80 | Temp 99.0°F | Ht 67.0 in | Wt 236.0 lb

## 2011-12-05 DIAGNOSIS — J310 Chronic rhinitis: Secondary | ICD-10-CM

## 2011-12-05 DIAGNOSIS — E669 Obesity, unspecified: Secondary | ICD-10-CM

## 2011-12-05 DIAGNOSIS — J45909 Unspecified asthma, uncomplicated: Secondary | ICD-10-CM

## 2011-12-05 DIAGNOSIS — J986 Disorders of diaphragm: Secondary | ICD-10-CM | POA: Insufficient documentation

## 2011-12-05 HISTORY — DX: Disorders of diaphragm: J98.6

## 2011-12-05 MED ORDER — PREDNISONE (PAK) 10 MG PO TABS: ORAL_TABLET | ORAL | Status: AC

## 2011-12-05 NOTE — Progress Notes (Signed)
Subjective:    Patient ID: Barry [name], male    DOB: [birth year], 64 y.o.   MRN: [id number]  HPI 67 yowm never smoker with relatively mild asthma but superimposed on obesity plus a chronically paralyzed left hemidiaphragm and a tendency to upper airway cough syndrome.     01/22/11--Returns for persistent symptoms of cough and wheezing. cc productive cough with clear mucus, wheezing, SOB, some tightness in chest - states is no better since finishing the avelox and pred taper. Last ov with asthmatic bronchitis flare tx with Avelox and steroid taper.Says congestion is clear now but still has cough and wheezing. No edema or chest pain.  >>tx w/  Steroid taper  04/02/2011 Acute Office visit/ Np.  Complains of cough, congestion, drainage, low grade fever, sinus pain and pressure. Coughing up green mucus. Began over last week. Took mucinex without much help. Seen 2 months ago with bronchitis his symptoms totally resolved until last week.  Feel bad all over, no energy.  rec Omnicef 300mg  Twice daily  For 10 days  Mucinex DM Twice daily  As needed  Cough/congesiton  Fluids and rest  Tylenol As needed   Saline nasal rinses As needed   Hydromet As needed  For cough, may make you sleepy  06/06/2011 ov/ [name]  Transiently better p last ov then worse x one week assoc with sinus congestion worst p stirring  In am but not disturbing sleep.  rec Make sure you take your nebulizer first thing in am Prednisone 10 mg take  4 each am x 2 days,   2 each am x 2 days,  1 each am x2days and stop  For nasal congestion, drainage, allergies, sneezing  Try zyrtec at bedtime and if can't tolerate it then clariton 10 mg every 24 hours and call if not satisfied.   08/28/2011 f/u ov/[name] cc cough Worse x 4 days, prod with green sputum assoc with nasal congestion but no need for daytime saba or increased sob rec Omnicef 300 mg one twice daily x 10 days See calendar for specific medication instructions and bring it back for each  and every office visit    12/05/2011 f/u ov/[name] cc no change doe x [employer] slow with buggy half the store main complaint is nasal congestion but no purulent sputum or sinus pain, really no cough or increase need for daytime saba.   Sleeping ok without nocturnal  or early am exacerbation  of respiratory  c/o's or need for noct saba. Also denies any obvious fluctuation of symptoms with weather or environmental changes or other aggravating or alleviating factors except as outlined above   ROS  At present neg for  any significant sore throat, dysphagia, itching, sneezing,  nasal congestion or excess/ purulent secretions,  fever, chills, sweats, unintended wt loss, pleuritic or exertional cp, hempoptysis, orthopnea pnd or leg swelling.  Also denies presyncope, palpitations, heartburn, abdominal pain, nausea, vomiting, diarrhea  or change in bowel or urinary habits, dysuria,hematuria,  rash, arthralgias, visual complaints, headache, numbness weakness or ataxia.   .        Allergies    1) ! 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  no alcohol  single  no children  disabled: textiles         Objective:   Physical Exam GEN: A/Ox3; pleasant , NAD, well nourished. Very awkward gait  Wt  233 06/06/11  > 244  08/28/2011 > 12/05/2011  236  HEENT:  [place]/AT,  EACs-clear, TMs-wnl, NOSE-clear drainage, sinus pain/pressure "max tendernees. , THROAT-clear, no lesions, no postnasal drip or exudate noted. Class 3 airway  NECK:  Supple w/ fair ROM; no JVD; normal carotid impulses w/o bruits; no thyromegaly or nodules palpated; no lymphadenopathy.  RESP  Distant bs, no localized or generalized wheezes or rhonchi  CARD:  RRR, no m/r/g  , no peripheral edema, pulses intact, no cyanosis or clubbing.  GI:   Soft & nt; nml bowel sounds; no organomegaly or masses detected.  Musco: Warm bil, walks with cane     Assessment & Plan:

## 2011-12-05 NOTE — Assessment & Plan Note (Signed)
Mild flare, no evidence of active infection > rx Prednisone 10 mg take  4 each am x 2 days,   2 each am x 2 days,  1 each am x2days and stop

## 2011-12-05 NOTE — Assessment & Plan Note (Signed)
Making minimal progress, discussed importance in view of paralyzed hemi diaphragm

## 2011-12-05 NOTE — Patient Instructions (Signed)
Prednisone 10 mg take  4 each am x 2 days,   2 each am x 2 days,  1 each am x 2 days and stop   See calendar for specific medication instructions and bring it back for each and every office visit for every healthcare provider you see.  Without it,  you may not receive the best quality medical care that we feel you deserve.  You will note that the calendar groups together  your maintenance  medications that are timed at particular times of the day.  Think of this as your checklist for what your doctor has instructed you to do until your next evaluation to see what benefit  there is  to staying on a consistent group of medications intended to keep you well.  The other group at the bottom is entirely up to you to use as you see fit  for specific symptoms that may arise between visits that require you to treat them on an as needed basis.  Think of this as your action plan or "what if" list.   Separating the top medications from the bottom group is fundamental to providing you adequate care going forward.    Please schedule a follow up visit in 3 months but call sooner if needed  

## 2011-12-05 NOTE — Assessment & Plan Note (Signed)
All goals of chronic asthma control met including optimal function and elimination of symptoms with minimal need for rescue therapy.  Contingencies discussed in full including contacting this office immediately if not controlling the symptoms using the rule of two's for rescue therapy.  This was done in the context of a medication calendar review which provided the patient with a user-friendly unambiguous mechanism for medication administration and reconciliation and provides an action plan for all active problems. It is critical that this be shown to every doctor  for modification during the office visit if necessary so the patient can use it as a working document.

## 2011-12-19 ENCOUNTER — Other Ambulatory Visit: Payer: Self-pay | Admitting: Cardiology

## 2012-01-16 ENCOUNTER — Other Ambulatory Visit: Payer: Self-pay | Admitting: Gastroenterology

## 2012-01-16 DIAGNOSIS — I2510 Atherosclerotic heart disease of native coronary artery without angina pectoris: Secondary | ICD-10-CM | POA: Diagnosis not present

## 2012-01-16 DIAGNOSIS — M1990 Unspecified osteoarthritis, unspecified site: Secondary | ICD-10-CM | POA: Diagnosis not present

## 2012-01-16 DIAGNOSIS — M542 Cervicalgia: Secondary | ICD-10-CM | POA: Diagnosis not present

## 2012-01-16 DIAGNOSIS — K5130 Ulcerative (chronic) rectosigmoiditis without complications: Secondary | ICD-10-CM | POA: Diagnosis not present

## 2012-01-16 DIAGNOSIS — J45909 Unspecified asthma, uncomplicated: Secondary | ICD-10-CM | POA: Diagnosis not present

## 2012-01-17 ENCOUNTER — Other Ambulatory Visit: Payer: Self-pay | Admitting: Cardiology

## 2012-01-21 ENCOUNTER — Ambulatory Visit
Admission: RE | Admit: 2012-01-21 | Discharge: 2012-01-21 | Disposition: A | Discharge status: Home / Self Care | Payer: Medicare Other | Source: Ambulatory Visit | Attending: Gastroenterology | Admitting: Gastroenterology

## 2012-01-21 DIAGNOSIS — M542 Cervicalgia: Secondary | ICD-10-CM

## 2012-01-21 DIAGNOSIS — M47812 Spondylosis without myelopathy or radiculopathy, cervical region: Secondary | ICD-10-CM | POA: Diagnosis not present

## 2012-01-21 DIAGNOSIS — M5030 Other cervical disc degeneration, unspecified cervical region: Secondary | ICD-10-CM | POA: Diagnosis not present

## 2012-01-21 DIAGNOSIS — M5020 Other cervical disc displacement, unspecified cervical region: Secondary | ICD-10-CM | POA: Diagnosis not present

## 2012-02-11 ENCOUNTER — Encounter: Payer: Self-pay | Admitting: Adult Health

## 2012-02-11 ENCOUNTER — Ambulatory Visit (INDEPENDENT_AMBULATORY_CARE_PROVIDER_SITE_OTHER): Payer: Medicare Other | Admitting: Adult Health

## 2012-02-11 VITALS — BP 142/70 | HR 94 | Temp 98.1°F | Ht 63.0 in | Wt 241.6 lb

## 2012-02-11 DIAGNOSIS — J45909 Unspecified asthma, uncomplicated: Secondary | ICD-10-CM | POA: Diagnosis not present

## 2012-02-11 MED ORDER — HYDROCODONE-HOMATROPINE 5-1.5 MG/5ML PO SYRP: 5.0000 mL | ORAL_SOLUTION | Freq: Four times a day (QID) | ORAL | Status: AC | PRN

## 2012-02-11 MED ORDER — CEFDINIR 300 MG PO CAPS: 300.0000 mg | ORAL_CAPSULE | Freq: Two times a day (BID) | ORAL | Status: AC

## 2012-02-11 NOTE — Progress Notes (Signed)
Subjective:    Patient ID: Barry [name], male    DOB: 11-10-1946, 66 y.o.   MRN: [id number]  HPI 29 yowm never smoker with relatively mild asthma but superimposed on obesity plus a chronically paralyzed left hemidiaphragm and a tendency to upper airway cough syndrome.     01/22/11--Returns for persistent symptoms of cough and wheezing. cc productive cough with clear mucus, wheezing, SOB, some tightness in chest - states is no better since finishing the avelox and pred taper. Last ov with asthmatic bronchitis flare tx with Avelox and steroid taper.Says congestion is clear now but still has cough and wheezing. No edema or chest pain.  >>tx w/  Steroid taper  04/02/2011 Acute Office visit/ Np.  Complains of cough, congestion, drainage, low grade fever, sinus pain and pressure. Coughing up green mucus. Began over last week. Took mucinex without much help. Seen 2 months ago with bronchitis his symptoms totally resolved until last week.  Feel bad all over, no energy.  rec Omnicef 300mg  Twice daily  For 10 days  Mucinex DM Twice daily  As needed  Cough/congesiton  Fluids and rest  Tylenol As needed   Saline nasal rinses As needed   Hydromet As needed  For cough, may make you sleepy  06/06/2011 ov/ [name]  Transiently better p last ov then worse x one week assoc with sinus congestion worst p stirring  In am but not disturbing sleep.  rec Make sure you take your nebulizer first thing in am Prednisone 10 mg take  4 each am x 2 days,   2 each am x 2 days,  1 each am x2days and stop  For nasal congestion, drainage, allergies, sneezing  Try zyrtec at bedtime and if can't tolerate it then clariton 10 mg every 24 hours and call if not satisfied.   08/28/2011 f/u ov/[name] cc cough Worse x 4 days, prod with green sputum assoc with nasal congestion but no need for daytime saba or increased sob rec Omnicef 300 mg one twice daily x 10 days See calendar for specific medication instructions and bring it back for each  and every office visit    12/05/2011 f/u ov/[name] cc no change doe x [employer] slow with buggy half the store main complaint is nasal congestion but no purulent sputum or sinus pain, really no cough or increase need for daytime saba. >>steroid taper  02/11/2012 Acute OV  Complains of productive cough with light green mucus along with sinus congestion for 4 days. More dyspnea and wheezing for last week.  OTC not helping.  No hemoptysis. No n/v/d. No increased leg swelling.  Increased SABA use for last few days.       ROS:  Constitutional:   No  weight loss, night sweats,  Fevers, chills, fatigue, or  lassitude.  HEENT:   No headaches,  Difficulty swallowing,  Tooth/dental problems, or  Sore throat,                No sneezing, itching, ear ache,  +nasal congestion, post nasal drip,   CV:  No chest pain,  Orthopnea, PND, swelling in lower extremities, anasarca, dizziness, palpitations, syncope.   GI  No heartburn, indigestion, abdominal pain, nausea, vomiting, diarrhea, change in bowel habits, loss of appetite, bloody stools.   Resp:    No coughing up of blood.   No chest wall deformity  Skin: no rash or lesions.  GU: no dysuria, change in color of urine, no urgency or frequency.  No  flank pain, no hematuria   MS:  No joint pain or swelling.  No decreased range of motion.  No back pain.  Psych:  No change in mood or affect. No depression or anxiety.  No memory loss.           Allergies    1) ! 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  no alcohol  single  no children  disabled: textiles         Objective:   Physical Exam GEN: A/Ox3; pleasant , NAD, well nourished.  Chronic abn gait  Wt  233 06/06/11  > 244 08/28/2011 > 12/05/2011  236> 241 02/11/2012  HEENT:  [place]/AT,  EACs-clear, TMs-wnl, NOSE-clear drainage,  , THROAT-clear, no lesions, no postnasal drip or exudate noted. Class 3 airway  NECK:  Supple w/ fair ROM; no JVD; normal carotid impulses w/o bruits; no thyromegaly or nodules palpated; no lymphadenopathy.  RESP  Distant bs, no localized or generalized wheezes or rhonchi  CARD:  RRR, no m/r/g  , no peripheral edema, pulses intact, no cyanosis or clubbing.  GI:   Soft & nt; nml bowel sounds; no organomegaly or masses detected.  Musco: Warm bil, walks with cane     Assessment & Plan:

## 2012-02-11 NOTE — Assessment & Plan Note (Signed)
Exacerbation with associated URI   Plan:  Omnicef 300mg  Twice daily  For 7 ays  Mucinex DM Twice daily  As needed  Cough/congesiton  Fluids and rest  Tylenol As needed   Saline nasal rinses As needed   Hydromet As needed  For cough, may make you sleepy.  Please contact office for sooner follow up if symptoms do not improve or worsen or seek emergency care   follow up as planned with  Dr. Sherene Sires

## 2012-02-11 NOTE — Patient Instructions (Signed)
Omnicef 300mg  Twice daily  For 7 ays  Mucinex DM Twice daily  As needed  Cough/congesiton  Fluids and rest  Tylenol As needed   Saline nasal rinses As needed   Hydromet As needed  For cough, may make you sleepy.  Please contact office for sooner follow up if symptoms do not improve or worsen or seek emergency care   follow up as planned with  Dr. Sherene Sires

## 2012-02-18 DIAGNOSIS — Z79899 Other long term (current) drug therapy: Secondary | ICD-10-CM | POA: Diagnosis not present

## 2012-02-18 DIAGNOSIS — M1710 Unilateral primary osteoarthritis, unspecified knee: Secondary | ICD-10-CM | POA: Diagnosis not present

## 2012-02-18 DIAGNOSIS — M47812 Spondylosis without myelopathy or radiculopathy, cervical region: Secondary | ICD-10-CM | POA: Diagnosis not present

## 2012-03-03 DIAGNOSIS — M47812 Spondylosis without myelopathy or radiculopathy, cervical region: Secondary | ICD-10-CM | POA: Diagnosis not present

## 2012-03-04 ENCOUNTER — Ambulatory Visit (INDEPENDENT_AMBULATORY_CARE_PROVIDER_SITE_OTHER)
Admission: RE | Admit: 2012-03-04 | Discharge: 2012-03-04 | Disposition: A | Discharge status: Home / Self Care | Payer: Medicare Other | Source: Ambulatory Visit | Attending: Internal Medicine | Admitting: Internal Medicine

## 2012-03-04 ENCOUNTER — Ambulatory Visit (INDEPENDENT_AMBULATORY_CARE_PROVIDER_SITE_OTHER): Payer: Medicare Other | Admitting: Internal Medicine

## 2012-03-04 ENCOUNTER — Encounter: Payer: Self-pay | Admitting: Internal Medicine

## 2012-03-04 VITALS — BP 148/76 | HR 78 | Temp 98.3°F | Ht 63.0 in | Wt 239.2 lb

## 2012-03-04 DIAGNOSIS — J45909 Unspecified asthma, uncomplicated: Secondary | ICD-10-CM

## 2012-03-04 DIAGNOSIS — R05 Cough: Secondary | ICD-10-CM | POA: Diagnosis not present

## 2012-03-04 DIAGNOSIS — R0989 Other specified symptoms and signs involving the circulatory and respiratory systems: Secondary | ICD-10-CM | POA: Diagnosis not present

## 2012-03-04 MED ORDER — AMOXICILLIN-POT CLAVULANATE 875-125 MG PO TABS: 1.0000 | ORAL_TABLET | Freq: Two times a day (BID) | ORAL | Status: AC

## 2012-03-04 MED ORDER — PREDNISONE (PAK) 10 MG PO TABS: ORAL_TABLET | ORAL | Status: AC

## 2012-03-04 NOTE — Progress Notes (Signed)
Subjective:    Patient ID: Barry [name], male    DOB: 1946-09-15, 66 y.o.   MRN: [id number]  HPI 35 yowm never smoker with relatively mild asthma but superimposed on obesity plus a chronically paralyzed left hemidiaphragm and a tendency to upper airway cough syndrome.     01/22/11--Returns for persistent symptoms of cough and wheezing. cc productive cough with clear mucus, wheezing, SOB, some tightness in chest - states is no better since finishing the avelox and pred taper. Last ov with asthmatic bronchitis flare tx with Avelox and steroid taper.Says congestion is clear now but still has cough and wheezing. No edema or chest pain.  >>tx w/  Steroid taper  04/02/2011 Acute Office visit/ Np.  Complains of cough, congestion, drainage, low grade fever, sinus pain and pressure. Coughing up green mucus. Began over last week. Took mucinex without much help. Seen 2 months ago with bronchitis his symptoms totally resolved until last week.  Feel bad all over, no energy.  rec Omnicef 300mg  Twice daily  For 10 days  Mucinex DM Twice daily  As needed  Cough/congesiton  Fluids and rest  Tylenol As needed   Saline nasal rinses As needed   Hydromet As needed  For cough, may make you sleepy  06/06/2011 ov/ Tevis Conger  Transiently better p last ov then worse x one week assoc with sinus congestion worst p stirring  In am but not disturbing sleep.  rec Make sure you take your nebulizer first thing in am Prednisone 10 mg take  4 each am x 2 days,   2 each am x 2 days,  1 each am x2days and stop  For nasal congestion, drainage, allergies, sneezing  Try zyrtec at bedtime and if can't tolerate it then clariton 10 mg every 24 hours and call if not satisfied.   08/28/2011 f/u ov/Jasneet Schobert cc cough Worse x 4 days, prod with green sputum assoc with nasal congestion but no need for daytime saba or increased sob rec Omnicef 300 mg one twice daily x 10 days See calendar for specific medication instructions and bring it back for each  and every office visit    12/05/2011 f/u ov/Aryka Coonradt cc no change doe x [employer] slow with buggy half the store main complaint is nasal congestion but no purulent sputum or sinus pain, really no cough or increase need for daytime saba. >>steroid taper  02/11/2012 Acute OV  Complains of productive cough with light green mucus along with sinus congestion for 4 days. More dyspnea and wheezing for last week.  OTC not helping.  No hemoptysis. No n/v/d. No increased leg swelling.  Increased SABA use for last few days.  rec Omnicef 300mg  Twice daily  For 7 ays  Mucinex DM Twice daily  As needed  Cough/congesiton  Fluids and rest  Tylenol As needed   Saline nasal rinses As needed   Hydromet As needed     03/04/2012 f/u ov/Deontay Ladnier 50% back to nl then worse again p one week off abx Cc c/o sob, cough with yellow mucus, congestion, and wheezing. Denies chest pain and chest tightness. Using brov/ bud plus saba maybe once daily  Sleeping ok without nocturnal  or early am exacerbation  of respiratory  c/o's or need for noct saba. Also denies any obvious fluctuation of symptoms with weather or environmental changes or other aggravating or alleviating factors except as outlined above           Allergies    1) ! 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  no alcohol  single  no children  disabled: textiles         Objective:   Physical Exam GEN: A/Ox3; pleasant , NAD, well nourished.  Chronic abn gait  Wt  233 06/06/11  > 244 08/28/2011 > 12/05/2011  236> 241 02/11/2012 > 03/04/2012 239 HEENT:  [place]/AT,  EACs-clear, TMs-wnl, NOSE-clear drainage,  , THROAT-clear, no lesions, no postnasal drip or exudate noted. Class 3 airway  NECK:  Supple w/ fair ROM; no JVD; normal carotid impulses w/o bruits; no thyromegaly or nodules palpated; no lymphadenopathy.  RESP  Distant bs, no localized or generalized wheezes or rhonchi  CARD:  RRR, no m/r/g  , no peripheral edema, pulses intact, no cyanosis or clubbing.  GI:   Soft & nt; nml bowel sounds; no organomegaly or masses detected.  Musco: Warm bil, walks with cane   CXR  03/04/2012 :  Stable chest x-ray of mild cardiomegaly and elevation of the left hemidiaphragm. No active process.     Assessment & Plan:

## 2012-03-04 NOTE — Assessment & Plan Note (Addendum)
DDX of  difficult airways managment all start with A and  include Adherence, Ace Inhibitors, Acid Reflux, Active Sinus Disease, Alpha 1 Antitripsin deficiency, Anxiety masquerading as Airways dz,  ABPA,  allergy(esp in young), Aspiration (esp in elderly), Adverse effects of DPI,  Active smokers, plus two Bs  = Bronchiectasis and Beta blocker use..and one C= CHF  Adherence is always the initial "prime suspect" and is a multilayered concern that requires a "trust but verify" approach -   Each maintenance medication was reviewed in detail including most importantly the difference between maintenance and as needed and under what circumstances the prns are to be used. This was done in the context of a medication calendar review which provided the patient with a user-friendly unambiguous mechanism for medication administration and reconciliation and provides an action plan for all active problems. It is critical that this be shown to every doctor  for modification during the office visit if necessary so the patient can use it as a working document.      ? Active sinus dz > augmentin x 10 days then sinus ct if not better  ? Acid reflux component > reviewed rx

## 2012-03-04 NOTE — Patient Instructions (Signed)
Augmentin x 10 days take with bfast and supper and yogurt at night  Prednisone 10 mg take  4 each am x 2 days,   2 each am x 2 days,  1 each am x2days and stop   Call if not 100% back to nl in 10 days  Please remember to go to the  x-ray department downstairs for your tests - we will call you with the results when they are available.

## 2012-03-05 NOTE — Progress Notes (Signed)
Quick Note:  Spoke with pt and notified of results per Dr. [name]. Pt verbalized understanding and denied any questions.  ______ 

## 2012-03-21 ENCOUNTER — Telehealth: Payer: Self-pay | Admitting: Internal Medicine

## 2012-03-21 NOTE — Telephone Encounter (Signed)
I spoke with pt and is scheduled to come in and see KC on Monday at 10:45 for an evaluation of his chest congestion and cough. Pt aware to see emergency care if he worsens over the weekend. He needed nothing further

## 2012-03-24 ENCOUNTER — Ambulatory Visit (INDEPENDENT_AMBULATORY_CARE_PROVIDER_SITE_OTHER)
Admission: RE | Admit: 2012-03-24 | Discharge: 2012-03-24 | Disposition: A | Discharge status: Home / Self Care | Payer: Medicare Other | Source: Ambulatory Visit | Attending: Pulmonary Disease | Admitting: Pulmonary Disease

## 2012-03-24 ENCOUNTER — Ambulatory Visit (INDEPENDENT_AMBULATORY_CARE_PROVIDER_SITE_OTHER): Payer: Medicare Other | Admitting: Pulmonary Disease

## 2012-03-24 ENCOUNTER — Encounter: Payer: Self-pay | Admitting: Pulmonary Disease

## 2012-03-24 DIAGNOSIS — J019 Acute sinusitis, unspecified: Secondary | ICD-10-CM | POA: Insufficient documentation

## 2012-03-24 DIAGNOSIS — J3489 Other specified disorders of nose and nasal sinuses: Secondary | ICD-10-CM | POA: Diagnosis not present

## 2012-03-24 HISTORY — DX: Acute sinusitis, unspecified: J01.90

## 2012-03-24 MED ORDER — LEVOFLOXACIN 750 MG PO TABS: 750.0000 mg | ORAL_TABLET | Freq: Every day | ORAL | Status: AC

## 2012-03-24 NOTE — Patient Instructions (Signed)
Will treat with levaquin 750mg  one a day for 7 days, but this may be extended depending upon your sinus scan results Try neilmed sinus rinses am and pm to see if helps nasal symptoms. Will order scan of your sinuses, and call you with results.. Depending upon xray results, will arrange followup .

## 2012-03-24 NOTE — Progress Notes (Signed)
  Subjective:    Patient ID: Barry [name], male    DOB: 1946/05/10, 66 y.o.   MRN: [id number]  HPI The patient comes in today for an acute sick visit.  He has a known history of asthma, but has been having issues with sinus and chest congestion over the last 3 months that have required repeated courses of antibiotics.  He was last seen in the middle of March by Dr. Sherene Sires, and was treated with a course of Augmentin.  The patient did see improvement for a short period of time and now his symptoms have returned.  He currently complains of sinus congestion as well as chest congestion.  He has a cough with only occasional mucus production that is discolored.  He denies any fevers, chills, or sweats.  He feels that his breathing is not at his usual baseline.   Review of Systems  Constitutional: Negative for fever and unexpected weight change.  HENT: Positive for sore throat, sneezing, postnasal drip and sinus pressure. Negative for ear pain, nosebleeds, congestion, rhinorrhea, trouble swallowing and dental problem.   Eyes: Positive for itching. Negative for redness.  Respiratory: Positive for cough, chest tightness, shortness of breath and wheezing.   Cardiovascular: Negative for palpitations and leg swelling.  Gastrointestinal: Negative for nausea and vomiting.  Genitourinary: Negative for dysuria.  Musculoskeletal: Negative for joint swelling.  Skin: Negative for rash.  Neurological: Positive for headaches.  Hematological: Does not bruise/bleed easily.  Psychiatric/Behavioral: Negative for dysphoric mood. The patient is not nervous/anxious.        Objective:   Physical Exam Obese male in no acute distress Nose with erythematous mucosa, but no purulence noted Oropharynx clear, no exudates Neck without lymphadenopathy Chest with mild decrease in breath sounds but adequate air flow.  No wheezes or rhonchi Cardiac exam with regular rate and rhythm Lower extremities with mild left ankle edema,  otherwise okay.  No cyanosis noted Alert and oriented, moves all 4 extremities.       Assessment & Plan:

## 2012-03-24 NOTE — Assessment & Plan Note (Signed)
The patient has had recurrent sinopulmonary symptoms that have persisted despite multiple courses of antibiotics and prednisone.  His symptoms today seem more related to sinus disease, and therefore agree that he needs a scan of his sinuses.  Will put him on a short course of antibiotics, but will need to be extended if he does indeed have chronic sinus disease.  I have also recommended Neilmed sinus rinses.  He really has good air movement today on exam with no wheezing, and we'll therefore hold off on steroids.

## 2012-04-07 DIAGNOSIS — M47817 Spondylosis without myelopathy or radiculopathy, lumbosacral region: Secondary | ICD-10-CM | POA: Diagnosis not present

## 2012-04-07 DIAGNOSIS — M47812 Spondylosis without myelopathy or radiculopathy, cervical region: Secondary | ICD-10-CM | POA: Diagnosis not present

## 2012-04-19 ENCOUNTER — Other Ambulatory Visit: Payer: Self-pay | Admitting: Cardiology

## 2012-05-05 DIAGNOSIS — M47817 Spondylosis without myelopathy or radiculopathy, lumbosacral region: Secondary | ICD-10-CM | POA: Diagnosis not present

## 2012-05-05 DIAGNOSIS — M48061 Spinal stenosis, lumbar region without neurogenic claudication: Secondary | ICD-10-CM | POA: Diagnosis not present

## 2012-05-14 ENCOUNTER — Other Ambulatory Visit: Payer: Self-pay | Admitting: Adult Health

## 2012-05-20 DIAGNOSIS — I2510 Atherosclerotic heart disease of native coronary artery without angina pectoris: Secondary | ICD-10-CM | POA: Diagnosis not present

## 2012-05-20 DIAGNOSIS — Z0000 Encounter for general adult medical examination without abnormal findings: Secondary | ICD-10-CM | POA: Diagnosis not present

## 2012-05-20 DIAGNOSIS — E7800 Pure hypercholesterolemia, unspecified: Secondary | ICD-10-CM | POA: Diagnosis not present

## 2012-05-20 DIAGNOSIS — K5130 Ulcerative (chronic) rectosigmoiditis without complications: Secondary | ICD-10-CM | POA: Diagnosis not present

## 2012-05-20 DIAGNOSIS — Z86010 Personal history of colonic polyps: Secondary | ICD-10-CM | POA: Diagnosis not present

## 2012-05-20 DIAGNOSIS — R197 Diarrhea, unspecified: Secondary | ICD-10-CM | POA: Diagnosis not present

## 2012-05-20 DIAGNOSIS — I10 Essential (primary) hypertension: Secondary | ICD-10-CM | POA: Diagnosis not present

## 2012-05-21 DIAGNOSIS — R197 Diarrhea, unspecified: Secondary | ICD-10-CM | POA: Diagnosis not present

## 2012-05-22 DIAGNOSIS — R82998 Other abnormal findings in urine: Secondary | ICD-10-CM | POA: Diagnosis not present

## 2012-06-02 DIAGNOSIS — M1710 Unilateral primary osteoarthritis, unspecified knee: Secondary | ICD-10-CM | POA: Diagnosis not present

## 2012-06-02 DIAGNOSIS — M47812 Spondylosis without myelopathy or radiculopathy, cervical region: Secondary | ICD-10-CM | POA: Diagnosis not present

## 2012-06-02 DIAGNOSIS — M47817 Spondylosis without myelopathy or radiculopathy, lumbosacral region: Secondary | ICD-10-CM | POA: Diagnosis not present

## 2012-06-17 ENCOUNTER — Other Ambulatory Visit: Payer: Self-pay | Admitting: Gastroenterology

## 2012-06-17 DIAGNOSIS — Z86010 Personal history of colonic polyps: Secondary | ICD-10-CM | POA: Diagnosis not present

## 2012-06-17 DIAGNOSIS — Z09 Encounter for follow-up examination after completed treatment for conditions other than malignant neoplasm: Secondary | ICD-10-CM | POA: Diagnosis not present

## 2012-06-17 DIAGNOSIS — K5289 Other specified noninfective gastroenteritis and colitis: Secondary | ICD-10-CM | POA: Diagnosis not present

## 2012-06-17 DIAGNOSIS — D126 Benign neoplasm of colon, unspecified: Secondary | ICD-10-CM | POA: Diagnosis not present

## 2012-06-17 DIAGNOSIS — K5130 Ulcerative (chronic) rectosigmoiditis without complications: Secondary | ICD-10-CM | POA: Diagnosis not present

## 2012-06-23 ENCOUNTER — Other Ambulatory Visit: Payer: Self-pay | Admitting: Cardiology

## 2012-06-24 DIAGNOSIS — H35379 Puckering of macula, unspecified eye: Secondary | ICD-10-CM | POA: Diagnosis not present

## 2012-06-24 DIAGNOSIS — H40039 Anatomical narrow angle, unspecified eye: Secondary | ICD-10-CM | POA: Diagnosis not present

## 2012-06-24 DIAGNOSIS — H2510 Age-related nuclear cataract, unspecified eye: Secondary | ICD-10-CM | POA: Diagnosis not present

## 2012-06-24 DIAGNOSIS — H04129 Dry eye syndrome of unspecified lacrimal gland: Secondary | ICD-10-CM | POA: Diagnosis not present

## 2012-06-24 DIAGNOSIS — H35349 Macular cyst, hole, or pseudohole, unspecified eye: Secondary | ICD-10-CM | POA: Diagnosis not present

## 2012-06-26 ENCOUNTER — Encounter (INDEPENDENT_AMBULATORY_CARE_PROVIDER_SITE_OTHER): Payer: Medicare Other | Admitting: Ophthalmology

## 2012-06-26 DIAGNOSIS — I10 Essential (primary) hypertension: Secondary | ICD-10-CM

## 2012-06-26 DIAGNOSIS — H35379 Puckering of macula, unspecified eye: Secondary | ICD-10-CM | POA: Diagnosis not present

## 2012-06-26 DIAGNOSIS — H35039 Hypertensive retinopathy, unspecified eye: Secondary | ICD-10-CM

## 2012-06-26 DIAGNOSIS — H35349 Macular cyst, hole, or pseudohole, unspecified eye: Secondary | ICD-10-CM | POA: Diagnosis not present

## 2012-06-26 DIAGNOSIS — H43819 Vitreous degeneration, unspecified eye: Secondary | ICD-10-CM

## 2012-07-03 ENCOUNTER — Ambulatory Visit (INDEPENDENT_AMBULATORY_CARE_PROVIDER_SITE_OTHER): Payer: Medicare Other | Admitting: Internal Medicine

## 2012-07-03 ENCOUNTER — Encounter: Payer: Self-pay | Admitting: Internal Medicine

## 2012-07-03 VITALS — BP 124/70 | HR 72 | Temp 99.3°F | Ht 63.0 in | Wt 239.0 lb

## 2012-07-03 DIAGNOSIS — J310 Chronic rhinitis: Secondary | ICD-10-CM

## 2012-07-03 DIAGNOSIS — R05 Cough: Secondary | ICD-10-CM | POA: Diagnosis not present

## 2012-07-03 DIAGNOSIS — J45909 Unspecified asthma, uncomplicated: Secondary | ICD-10-CM

## 2012-07-03 MED ORDER — LEVOFLOXACIN 750 MG PO TABS: 750.0000 mg | ORAL_TABLET | Freq: Every day | ORAL | Status: AC

## 2012-07-03 NOTE — Assessment & Plan Note (Signed)
Adequate control on present rx, reviewed  

## 2012-07-03 NOTE — Patient Instructions (Addendum)
Levaquin 750 mg one daily x 5 days  See calendar for specific medication instructions and bring it back for each and every office visit for every healthcare provider you see.  Without it,  you may not receive the best quality medical care that we feel you deserve.  You will note that the calendar groups together  your maintenance  medications that are timed at particular times of the day.  Think of this as your checklist for what your doctor has instructed you to do until your next evaluation to see what benefit  there is  to staying on a consistent group of medications intended to keep you well.  The other group at the bottom is entirely up to you to use as you see fit  for specific symptoms that may arise between visits that require you to treat them on an as needed basis.  Think of this as your action plan or "what if" list.   Separating the top medications from the bottom group is fundamental to providing you adequate care going forward.   Please schedule a follow up office visit in 4 weeks, sooner if needed

## 2012-07-03 NOTE — Progress Notes (Deleted)
  Subjective:    Patient ID: Barry [name], male    DOB: 13-Jan-1946   MRN: [id number]  HPI The patient comes in today for an acute sick visit.  He has a known history of asthma, but has been having issues with sinus and chest congestion over the last 3 months that have required repeated courses of antibiotics.  He was last seen in the middle of March by Dr. Sherene Sires, and was treated with a course of Augmentin.  The patient did see improvement for a short period of time and now his symptoms have returned.  He currently complains of sinus congestion as well as chest congestion.  He has a cough with only occasional mucus production that is discolored.  He denies any fevers, chills, or sweats.  He feels that his breathing is not at his usual baseline.        Objective:   Physical Exam Obese male in no acute distress Nose with erythematous mucosa, but no purulence noted Oropharynx clear, no exudates Neck without lymphadenopathy Chest with mild decrease in breath sounds but adequate air flow.  No wheezes or rhonchi Cardiac exam with regular rate and rhythm Lower extremities with mild left ankle edema, otherwise okay.  No cyanosis noted Alert and oriented, moves all 4 extremities.       Assessment & Plan:

## 2012-07-03 NOTE — Assessment & Plan Note (Signed)
The most common causes of chronic cough in immunocompetent adults include the following: upper airway cough syndrome (UACS), previously referred to as postnasal drip syndrome (PNDS), which is caused by variety of rhinosinus conditions; (2) asthma; (3) GERD; (4) chronic bronchitis from cigarette smoking or other inhaled environmental irritants; (5) nonasthmatic eosinophilic bronchitis; and (6) bronchiectasis.   These conditions, singly or in combination, have accounted for up to 94% of the causes of chronic cough in prospective studies.   Other conditions have constituted no >6% of the causes in prospective studies These have included bronchogenic carcinoma, chronic interstitial pneumonia, sarcoidosis, left ventricular failure, ACEI-induced cough, and aspiration from a condition associated with pharyngeal dysfunction.   Most likely this is  Classic Upper airway cough syndrome, so named because it's frequently impossible to sort out how much is  CR/sinusitis with freq throat clearing (which can be related to primary GERD)   vs  causing  secondary (" extra esophageal")  GERD from wide swings in gastric pressure that occur with throat clearing, often  promoting self use of mint and menthol lozenges that reduce the lower esophageal sphincter tone and exacerbate the problem further in a cyclical fashion.   These are the same pts (now being labeled as having "irritable larynx syndrome" by some cough centers) who not infrequently have a history of having failed to tolerate ace inhibitors,  dry powder inhalers or biphosphonates or report having atypical reflux symptoms that don't respond to standard doses of PPI , and are easily confused as having aecopd or asthma flares by even experienced allergists/ pulmonologists.   Since this cough occurred on prednisone allergy and asthma unlikely. Concerned about the role of UC here on airways but note previous response to levaquin will continue this plus aggressive rx of  gerd    Each maintenance medication was reviewed in detail including most importantly the difference between maintenance and as needed and under what circumstances the prns are to be used. This was done in the context of a medication calendar review which provided the patient with a user-friendly unambiguous mechanism for medication administration and reconciliation and provides an action plan for all active problems. It is critical that this be shown to every doctor  for modification during the office visit if necessary so the patient can use it as a working document.

## 2012-07-03 NOTE — Progress Notes (Signed)
Subjective:    Patient ID: Barry [name], male    DOB: [birth year], 66y.o.   MRN: [id number]  HPI 69 yowm never smoker with relatively mild asthma but superimposed on obesity plus a chronically paralyzed left hemidiaphragm and a tendency to upper airway cough syndrome.     01/22/11--Returns for persistent symptoms of cough and wheezing. cc productive cough with clear mucus, wheezing, SOB, some tightness in chest - states is no better since finishing the avelox and pred taper. Last ov with asthmatic bronchitis flare tx with Avelox and steroid taper.Says congestion is clear now but still has cough and wheezing. No edema or chest pain.  >>tx w/  Steroid taper  04/02/2011 Acute Office visit/ Np.  Complains of cough, congestion, drainage, low grade fever, sinus pain and pressure. Coughing up green mucus. Began over last week. Took mucinex without much help. Seen 2 months ago with bronchitis his symptoms totally resolved until last week.  Feel bad all over, no energy.  rec Omnicef 300mg  Twice daily  For 10 days  Mucinex DM Twice daily  As needed  Cough/congesiton  Fluids and rest  Tylenol As needed   Saline nasal rinses As needed   Hydromet As needed  For cough, may make you sleepy  06/06/2011 ov/ [name]  Transiently better p last ov then worse x one week assoc with sinus congestion worst p stirring  In am but not disturbing sleep.  rec Make sure you take your nebulizer first thing in am Prednisone 10 mg take  4 each am x 2 days,   2 each am x 2 days,  1 each am x2days and stop  For nasal congestion, drainage, allergies, sneezing  Try zyrtec at bedtime and if can't tolerate it then clariton 10 mg every 24 hours and call if not satisfied.   08/28/2011 f/u ov/[name] cc cough Worse x 4 days, prod with green sputum assoc with nasal congestion but no need for daytime saba or increased sob rec Omnicef 300 mg one twice daily x 10 days See calendar for specific medication instructions and bring it back for each  and every office visit    12/05/2011 f/u ov/[name] cc no change doe x [facility] slow with buggy half the store main complaint is nasal congestion but no purulent sputum or sinus pain, really no cough or increase need for daytime saba. >>steroid taper  02/11/2012 Acute OV  Complains of productive cough with light green mucus along with sinus congestion for 4 days. More dyspnea and wheezing for last week.  OTC not helping.  No hemoptysis. No n/v/d. No increased leg swelling.  Increased SABA use for last few days.  rec Omnicef 300mg  Twice daily  For 7 ays  Mucinex DM Twice daily  As needed  Cough/congesiton  Fluids and rest  Tylenol As needed   Saline nasal rinses As needed   Hydromet As needed     03/04/2012 f/u ov/[name] 50% back to nl then worse again p one week off abx Cc c/o sob, cough with yellow mucus, congestion, and wheezing. Denies chest pain and chest tightness. Using brov/ bud plus saba maybe once daily. rec Augmentin x 10 days take with bfast and supper  Prednisone 10 mg take  4 each am x 2 days,   2 each am x 2 days,  1 each am x2days and stop   03/24/12 [name] ov for sinusitis > rx levaquin > sinus CT > Negative paranasal sinuses.   07/03/2012 f/u ov/[name] using med  calendar consistently much better p rx with levaquin despite nl sinus ct prior to rx, then worse "throat and chest congestion" again x one week prior to OV  , indolent onset  Worse  first thing am no real change doe , mucinex helps some. Started prednisone for UC before congestion flared.  Sleeping ok without nocturnal  or early am exacerbation  of respiratory  c/o's or need for noct saba. Also denies any obvious fluctuation of symptoms with weather or environmental changes or other aggravating or alleviating factors except as outlined above.  ROS  The following are not active complaints unless bolded sore throat, dysphagia, dental problems, itching, sneezing,  nasal congestion or excess/ purulent secretions, ear ache,    fever, chills, sweats, unintended wt loss, pleuritic or exertional cp, hemoptysis,  orthopnea pnd or leg swelling, presyncope, palpitations, heartburn, abdominal pain, anorexia, nausea, vomiting, diarrhea  or change in bowel or urinary habits, change in stools or urine, dysuria,hematuria,  rash, arthralgias, visual complaints, headache, numbness weakness or ataxia or problems with walking or coordination,  change in mood/affect or memory.              Allergies    1) ! 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  no alcohol  single  no children  disabled: textiles         Objective:   Physical Exam GEN: A/Ox3; pleasant , NAD, well nourished.  Chronic abn gait  Wt  233 06/06/11  >  241 02/11/2012 > 03/04/2012 239> 07/03/2012  239 HEENT:  [place]/AT,  EACs-clear, TMs-wnl, NOSE-clear drainage,  , THROAT-clear, no lesions, no postnasal drip or exudate noted. Class 3 airway  NECK:  Supple w/ fair ROM; no JVD; normal carotid impulses w/o bruits; no thyromegaly or nodules palpated; no lymphadenopathy.  RESP  Distant bs, no localized or generalized wheezes or rhonchi  CARD:  RRR, no m/r/g  , no peripheral edema, pulses intact, no cyanosis or clubbing.  GI:   Soft & nt; nml bowel sounds; no organomegaly or masses  detected.  Musco: Warm bil, walks with cane   CXR  03/04/2012 :  Stable chest x-ray of mild cardiomegaly and elevation of the left hemidiaphragm. No active process.     Assessment & Plan:

## 2012-07-16 ENCOUNTER — Encounter (INDEPENDENT_AMBULATORY_CARE_PROVIDER_SITE_OTHER): Payer: Medicare Other | Admitting: Ophthalmology

## 2012-07-31 ENCOUNTER — Encounter: Payer: Self-pay | Admitting: Internal Medicine

## 2012-07-31 ENCOUNTER — Ambulatory Visit (INDEPENDENT_AMBULATORY_CARE_PROVIDER_SITE_OTHER): Payer: Medicare Other | Admitting: Internal Medicine

## 2012-07-31 VITALS — BP 150/82 | HR 95 | Temp 98.2°F | Ht 63.0 in | Wt 240.6 lb

## 2012-07-31 DIAGNOSIS — J45909 Unspecified asthma, uncomplicated: Secondary | ICD-10-CM

## 2012-07-31 NOTE — Patient Instructions (Addendum)
See calendar for specific medication instructions and bring it back for each and every office visit for every healthcare provider you see.  Without it,  you may not receive the best quality medical care that we feel you deserve.  You will note that the calendar groups together  your maintenance  medications that are timed at particular times of the day.  Think of this as your checklist for what your doctor has instructed you to do until your next evaluation to see what benefit  there is  to staying on a consistent group of medications intended to keep you well.  The other group at the bottom is entirely up to you to use as you see fit  for specific symptoms that may arise between visits that require you to treat them on an as needed basis.  Think of this as your action plan or "what if" list.   Separating the top medications from the bottom group is fundamental to providing you adequate care going forward.    Follow up with [name] in 6 weeks with all medications in hand and she'll make you a new calendar (include over the counter and eye drops)

## 2012-07-31 NOTE — Progress Notes (Signed)
Subjective:    Patient ID: Barry [name], male    DOB: January 24, 1946 .   MRN: [id number]  HPI 57 yowm never smoker with relatively mild asthma but superimposed on obesity plus a chronically paralyzed left hemidiaphragm and a tendency to upper airway cough syndrome.     01/22/11--Returns for persistent symptoms of cough and wheezing. cc productive cough with clear mucus, wheezing, SOB, some tightness in chest - states is no better since finishing the avelox and pred taper. Last ov with asthmatic bronchitis flare tx with Avelox and steroid taper.Says congestion is clear now but still has cough and wheezing. No edema or chest pain.  >>tx w/  Steroid taper  04/02/2011 Acute Office visit/ Np.  Complains of cough, congestion, drainage, low grade fever, sinus pain and pressure. Coughing up green mucus. Began over last week. Took mucinex without much help. Seen 2 months ago with bronchitis his symptoms totally resolved until last week.  Feel bad all over, no energy.  rec Omnicef 300mg  Twice daily  For 10 days  Mucinex DM Twice daily  As needed  Cough/congesiton  Fluids and rest  Tylenol As needed   Saline nasal rinses As needed   Hydromet As needed  For cough, may make you sleepy  06/06/2011 ov/ Lumina Gitto  Transiently better p last ov then worse x one week assoc with sinus congestion worst p stirring  In am but not disturbing sleep.  rec Make sure you take your nebulizer first thing in am Prednisone 10 mg take  4 each am x 2 days,   2 each am x 2 days,  1 each am x2days and stop  For nasal congestion, drainage, allergies, sneezing  Try zyrtec at bedtime and if can't tolerate it then clariton 10 mg every 24 hours and call if not satisfied.   08/28/2011 f/u ov/Ritu Gagliardo cc cough Worse x 4 days, prod with green sputum assoc with nasal congestion but no need for daytime saba or increased sob rec Omnicef 300 mg one twice daily x 10 days See calendar for specific medication instructions and bring it back for each and  every office visit    12/05/2011 f/u ov/Atharva Mirsky cc no change doe x [employer] slow with buggy half the store main complaint is nasal congestion but no purulent sputum or sinus pain, really no cough or increase need for daytime saba. >>steroid taper  02/11/2012 Acute OV  Complains of productive cough with light green mucus along with sinus congestion for 4 days. More dyspnea and wheezing for last week.  OTC not helping.  No hemoptysis. No n/v/d. No increased leg swelling.  Increased SABA use for last few days.  rec Omnicef 300mg  Twice daily  For 7 ays  Mucinex DM Twice daily  As needed  Cough/congesiton  Fluids and rest  Tylenol As needed   Saline nasal rinses As needed   Hydromet As needed     03/04/2012 f/u ov/Zeya Balles 50% back to nl then worse again p one week off abx Cc c/o sob, cough with yellow mucus, congestion, and wheezing. Denies chest pain and chest tightness. Using brov/ bud plus saba maybe once daily. rec Augmentin x 10 days take with bfast and supper  Prednisone 10 mg take  4 each am x 2 days,   2 each am x 2 days,  1 each am x2days and stop   03/24/12 [name] ov for sinusitis > rx levaquin > sinus CT > Negative paranasal sinuses.   07/03/2012 f/u ov/Perris Conwell using med  calendar consistently much better p rx with levaquin despite nl sinus ct prior to rx, then worse "throat and chest congestion" again x one week prior to OV  , indolent onset  Worse  first thing am no real change doe , mucinex helps some. Started prednisone for UC before congestion flared. rec Levaquin 750 mg one daily x 5 days See calendar for specific medication instructions   07/31/2012 f/u ov/Genee Rann  Brought old med calendar cc Cough good days and bad days - very little mucus (off white) Shortness of Breath with activity about the same,  unchanged Nasal Congestion x 1 wk - most days use proaire avg once a day in afternoon.  No obvious daytime variabilty or assoc  cp or chest tightness, subjective wheeze overt sinus or hb  symptoms. No unusual exp hx    Sleeping ok without nocturnal  or early am exacerbation  of respiratory  c/o's or need for noct saba. Also denies any obvious fluctuation of symptoms with weather or environmental changes or other aggravating or alleviating factors except as outlined above.  ROS  The following are not active complaints unless bolded sore throat, dysphagia, dental problems, itching, sneezing,  nasal congestion or excess/ purulent secretions, ear ache,   fever, chills, sweats, unintended wt loss, pleuritic or exertional cp, hemoptysis,  orthopnea pnd or leg swelling, presyncope, palpitations, heartburn, abdominal pain, anorexia, nausea, vomiting, diarrhea  or change in bowel or urinary habits, change in stools or urine, dysuria,hematuria,  rash, arthralgias, visual complaints, headache, numbness weakness or ataxia or problems with walking or coordination,  change in mood/affect or memory.              Allergies    1) ! 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  no alcohol  single  no children  disabled: textiles         Objective:   Physical Exam GEN: A/Ox3; pleasant , NAD, obese  Chronic abn gait  Wt  233 06/06/11  >  241 02/11/2012 > 03/04/2012 239> 07/03/2012  239 > 07/31/2012  240 HEENT:   [place]/AT,  EACs-clear, TMs-wnl, NOSE-clear drainage,  , THROAT-clear, no lesions, no postnasal drip or exudate noted. Class 3 airway  NECK:  Supple w/ fair ROM; no JVD; normal carotid impulses w/o bruits; no thyromegaly or nodules palpated; no lymphadenopathy.  RESP  Distant bs, no localized or generalized wheezes or rhonchi  CARD:  RRR, no m/r/g  , no peripheral edema, pulses intact, no cyanosis or clubbing.  GI:   Soft & nt; nml bowel sounds; no organomegaly or masses detected.  Musco: Warm bil, walks with cane   CXR  03/04/2012 :  Stable chest x-ray of mild cardiomegaly and elevation of the left hemidiaphragm. No active process.     Assessment & Plan:

## 2012-08-01 NOTE — Assessment & Plan Note (Addendum)
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  - HFA 25% August 01, 2009 > 50% November 01, 2009 > 50% January 31, 2010 therefore changed to neb bud/brovana - HFA 75% 08/28/2011   I had an extended discussion with the patient today lasting 15 to 20 minutes of a 25 minute visit on the following issues:   He is not keeping up with the med calendar, showing me an old one that was never updated when I've seen him in the meantime and written the  Correct date in the top R corner as I showed him today.   Each maintenance medication was reviewed in detail including most importantly the difference between maintenance and as needed and under what circumstances the prns are to be used. This was done in the context of a medication calendar review which provided the patient with a user-friendly unambiguous mechanism for medication administration and reconciliation and provides an action plan for all active problems. It is critical that this be shown to every doctor  for modification during the office visit if necessary so the patient can use it as a working document.     We need to regroup with him since he is on new eyedrops that may interfere with his asthma control in the context of a "trust but verify" visit and generate a new updated, accurate and fully reconciled med calendar we can all use going forward

## 2012-08-15 DIAGNOSIS — E669 Obesity, unspecified: Secondary | ICD-10-CM | POA: Diagnosis not present

## 2012-08-15 DIAGNOSIS — I2510 Atherosclerotic heart disease of native coronary artery without angina pectoris: Secondary | ICD-10-CM | POA: Diagnosis not present

## 2012-08-15 DIAGNOSIS — E785 Hyperlipidemia, unspecified: Secondary | ICD-10-CM | POA: Diagnosis not present

## 2012-08-15 DIAGNOSIS — I252 Old myocardial infarction: Secondary | ICD-10-CM | POA: Diagnosis not present

## 2012-08-26 DIAGNOSIS — H04129 Dry eye syndrome of unspecified lacrimal gland: Secondary | ICD-10-CM | POA: Diagnosis not present

## 2012-08-26 DIAGNOSIS — H40039 Anatomical narrow angle, unspecified eye: Secondary | ICD-10-CM | POA: Diagnosis not present

## 2012-08-26 DIAGNOSIS — H1045 Other chronic allergic conjunctivitis: Secondary | ICD-10-CM | POA: Diagnosis not present

## 2012-09-06 ENCOUNTER — Other Ambulatory Visit: Payer: Self-pay | Admitting: Adult Health

## 2012-09-11 ENCOUNTER — Ambulatory Visit (INDEPENDENT_AMBULATORY_CARE_PROVIDER_SITE_OTHER): Payer: Medicare Other | Admitting: Adult Health

## 2012-09-11 ENCOUNTER — Encounter: Payer: Self-pay | Admitting: Adult Health

## 2012-09-11 VITALS — BP 148/78 | HR 103 | Temp 97.0°F | Ht 63.0 in | Wt 247.6 lb

## 2012-09-11 DIAGNOSIS — J45909 Unspecified asthma, uncomplicated: Secondary | ICD-10-CM | POA: Diagnosis not present

## 2012-09-11 MED ORDER — AMOXICILLIN-POT CLAVULANATE 875-125 MG PO TABS: 1.0000 | ORAL_TABLET | Freq: Two times a day (BID) | ORAL | Status: AC

## 2012-09-11 MED ORDER — PREDNISONE 10 MG PO TABS: ORAL_TABLET | ORAL | Status: DC

## 2012-09-11 NOTE — Patient Instructions (Addendum)
Augmentin 875mg  Twice daily  For 10 days  Prednisone taper over next week.  Follow med calendar closely and bring to each visit.  Fluids and rest  Please contact office for sooner follow up if symptoms do not improve or worsen or seek emergency care   follow up as planned with  Dr. Sherene Sires 3 months

## 2012-09-11 NOTE — Assessment & Plan Note (Addendum)
Asthmatic bronchitis w/ sinusitis  Patient's medications were reviewed today and patient education was given. Computerized medication calendar was adjusted/completed   Plan;  Augmentin 875mg  Twice daily  For 10 days  Prednisone taper over next week.  Follow med calendar closely and bring to each visit.  Fluids and rest  Please contact office for sooner follow up if symptoms do not improve or worsen or seek emergency care   follow up as planned with  Dr. Sherene Sires 3 months

## 2012-09-11 NOTE — Progress Notes (Signed)
Subjective:    Patient ID: Barry [name], male    DOB: 1946-06-10 .   MRN: [id number] HPI 66 yowm never smoker with relatively mild asthma but superimposed on obesity plus a chronically paralyzed left hemidiaphragm and a tendency to upper airway cough syndrome.     01/22/11--Returns for persistent symptoms of cough and wheezing. cc productive cough with clear mucus, wheezing, SOB, some tightness in chest - states is no better since finishing the avelox and pred taper. Last ov with asthmatic bronchitis flare tx with Avelox and steroid taper.Says congestion is clear now but still has cough and wheezing. No edema or chest pain.  >>tx w/  Steroid taper  04/02/2011 Acute Office visit/ Np.  Complains of cough, congestion, drainage, low grade fever, sinus pain and pressure. Coughing up green mucus. Began over last week. Took mucinex without much help. Seen 2 months ago with bronchitis his symptoms totally resolved until last week.  Feel bad all over, no energy.  rec Omnicef 300mg  Twice daily  For 10 days  Mucinex DM Twice daily  As needed  Cough/congesiton  Fluids and rest  Tylenol As needed   Saline nasal rinses As needed   Hydromet As needed  For cough, may make you sleepy  06/06/2011 ov/ [name]  Transiently better p last ov then worse x one week assoc with sinus congestion worst p stirring  In am but not disturbing sleep.  rec Make sure you take your nebulizer first thing in am Prednisone 10 mg take  4 each am x 2 days,   2 each am x 2 days,  1 each am x2days and stop  For nasal congestion, drainage, allergies, sneezing  Try zyrtec at bedtime and if can't tolerate it then clariton 10 mg every 24 hours and call if not satisfied.   08/28/2011 f/u ov/[name] cc cough Worse x 4 days, prod with green sputum assoc with nasal congestion but no need for daytime saba or increased sob rec Omnicef 300 mg one twice daily x 10 days See calendar for specific medication instructions and bring it back for each and  every office visit    12/05/2011 f/u ov/[name] cc no change doe x [facility] slow with buggy half the store main complaint is nasal congestion but no purulent sputum or sinus pain, really no cough or increase need for daytime saba. >>steroid taper  02/11/2012 Acute OV  Complains of productive cough with light green mucus along with sinus congestion for 4 days. More dyspnea and wheezing for last week.  OTC not helping.  No hemoptysis. No n/v/d. No increased leg swelling.  Increased SABA use for last few days.  rec Omnicef 300mg  Twice daily  For 7 ays  Mucinex DM Twice daily  As needed  Cough/congesiton  Fluids and rest  Tylenol As needed   Saline nasal rinses As needed   Hydromet As needed     03/04/2012 f/u ov/[name] 50% back to nl then worse again p one week off abx Cc c/o sob, cough with yellow mucus, congestion, and wheezing. Denies chest pain and chest tightness. Using brov/ bud plus saba maybe once daily. rec Augmentin x 10 days take with bfast and supper  Prednisone 10 mg take  4 each am x 2 days,   2 each am x 2 days,  1 each am x2days and stop   03/24/12 [name] ov for sinusitis > rx levaquin > sinus CT > Negative paranasal sinuses.   07/03/2012 f/u ov/[name] using med calendar  consistently much better p rx with levaquin despite nl sinus ct prior to rx, then worse "throat and chest congestion" again x one week prior to OV  , indolent onset  Worse  first thing am no real change doe , mucinex helps some. Started prednisone for UC before congestion flared. rec Levaquin 750 mg one daily x 5 days See calendar for specific medication instructions   07/31/2012 f/u ov/[name]  Brought old med calendar cc Cough good days and bad days - very little mucus (off white) Shortness of Breath with activity about the same,  unchanged Nasal Congestion x 1 wk - most days use proaire avg once a day in afternoon.  No obvious daytime variabilty or assoc  cp or chest tightness, subjective wheeze overt sinus or hb  symptoms. No unusual exp hx   >>no changes   09/11/2012 Follow up and med review  Patient returns for a followup visit and medication review. We reviewed all his medications and organize his medication calendar with  patient education He appears to be taking his meds correctly Does complain of prod cough with white mucus, wheezing, tightness in chest x1 week. A lot of sinus congestion w/ thick discharge. More coughing for last few days. Wheezing worse for 1 day.  No hemoptysis or chest pain . No edema.         Allergies    1) ! 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  HEENT:   No headaches,  Difficulty swallowing,  Tooth/dental problems, or  Sore throat,                No sneezing, itching, ear ache,  +nasal congestion, post nasal drip,   CV:  No chest pain,  Orthopnea, PND, swelling in lower extremities, anasarca, dizziness, palpitations, syncope.   GI  No heartburn, indigestion, abdominal pain, nausea, vomiting, diarrhea, change in bowel habits, loss of appetite, bloody stools.   Resp:  No wheezing.  No chest wall  deformity  Skin: no rash or lesions.  GU: no dysuria, change in color of urine, no urgency or frequency.  No flank pain, no hematuria   MS:  No joint pain or swelling.  No decreased range of motion.   [name]  Psych:  No change in mood or affect. No depression or anxiety.  No memory loss.          Objective:   Physical Exam GEN: A/Ox3; pleasant , NAD, obese  Chronic abn gait  Wt  233 06/06/11  >  241 02/11/2012 > 03/04/2012 239> 07/03/2012  239 > 07/31/2012  240> 247 09/11/2012   HEENT:  [place]/AT,  EACs-clear, TMs-wnl, NOSE-clear drainage,  Sinus max tenderness  THROAT-clear, no lesions, no postnasal drip or exudate noted. Class 3 airway  NECK:  Supple w/ fair ROM; no JVD; normal carotid impulses w/o bruits; no thyromegaly or nodules palpated; no lymphadenopathy.  RESP  Distant bs, no localized or generalized wheezes or rhonchi  CARD:  RRR, no m/r/g  , no peripheral edema, pulses intact, no cyanosis or clubbing.  GI:   Soft & nt; nml bowel sounds; no organomegaly or masses detected.  Musco: Warm bil, walks with cane   CXR  03/04/2012 :  Stable chest x-ray of mild cardiomegaly and elevation of the left hemidiaphragm. No active process.     Assessment & Plan:

## 2012-09-19 NOTE — Addendum Note (Signed)
Addended by: Boone Master E on: 09/19/2012 11:46 AM   Modules accepted: Orders

## 2012-09-23 ENCOUNTER — Ambulatory Visit (INDEPENDENT_AMBULATORY_CARE_PROVIDER_SITE_OTHER)
Admission: RE | Admit: 2012-09-23 | Discharge: 2012-09-23 | Disposition: A | Discharge status: Home / Self Care | Payer: Medicare Other | Source: Ambulatory Visit | Attending: Adult Health | Admitting: Adult Health

## 2012-09-23 ENCOUNTER — Ambulatory Visit (INDEPENDENT_AMBULATORY_CARE_PROVIDER_SITE_OTHER): Payer: Medicare Other | Admitting: Adult Health

## 2012-09-23 ENCOUNTER — Encounter: Payer: Self-pay | Admitting: Adult Health

## 2012-09-23 VITALS — BP 142/68 | HR 82 | Temp 98.7°F | Ht 63.0 in | Wt 246.4 lb

## 2012-09-23 DIAGNOSIS — J45909 Unspecified asthma, uncomplicated: Secondary | ICD-10-CM

## 2012-09-23 DIAGNOSIS — R079 Chest pain, unspecified: Secondary | ICD-10-CM | POA: Diagnosis not present

## 2012-09-23 MED ORDER — LEVALBUTEROL HCL 0.63 MG/3ML IN NEBU
0.6300 mg | INHALATION_SOLUTION | Freq: Once | RESPIRATORY_TRACT | Status: AC
  Administered 2012-09-23: 0.63 mg via RESPIRATORY_TRACT

## 2012-09-23 MED ORDER — PREDNISONE 10 MG PO TABS: ORAL_TABLET | ORAL | Status: DC

## 2012-09-23 MED ORDER — HYDROCODONE-HOMATROPINE 5-1.5 MG/5ML PO SYRP: 5.0000 mL | ORAL_SOLUTION | Freq: Four times a day (QID) | ORAL | Status: AC | PRN

## 2012-09-23 MED ORDER — LEVOFLOXACIN 750 MG PO TABS: 750.0000 mg | ORAL_TABLET | Freq: Every day | ORAL | Status: AC

## 2012-09-23 NOTE — Assessment & Plan Note (Signed)
Acute Asthmatic Bronchitis along with sinsusitis -slow to resolve  Check xray today  xopenex neb in office   Plan Levaquin 750 mg one daily x 5 days Prednisone taper over next week.   Hydromet 1-2 tsp every 6-8 hr As needed   Fluids and rest .  I will call with xray results.  Please contact office for sooner follow up if symptoms do not improve or worsen or seek emergency care   follow up as planned with  Dr. Sherene Sires 3 months

## 2012-09-23 NOTE — Patient Instructions (Addendum)
Levaquin 750 mg one daily x 5 days Prednisone taper over next week.   Hydromet 1-2 tsp every 6-8 hr As needed   Fluids and rest .  I will call with xray results.  Please contact office for sooner follow up if symptoms do not improve or worsen or seek emergency care   follow up as planned with  Dr. Sherene Sires 3 months

## 2012-09-23 NOTE — Progress Notes (Signed)
Subjective:    Patient ID: Barry [name], male    DOB: 1946-06-10 .   MRN: [id number] HPI 66 yowm never smoker with relatively mild asthma but superimposed on obesity plus a chronically paralyzed left hemidiaphragm and a tendency to upper airway cough syndrome.     01/22/11--Returns for persistent symptoms of cough and wheezing. cc productive cough with clear mucus, wheezing, SOB, some tightness in chest - states is no better since finishing the avelox and pred taper. Last ov with asthmatic bronchitis flare tx with Avelox and steroid taper.Says congestion is clear now but still has cough and wheezing. No edema or chest pain.  >>tx w/  Steroid taper  04/02/2011 Acute Office visit/ Np.  Complains of cough, congestion, drainage, low grade fever, sinus pain and pressure. Coughing up green mucus. Began over last week. Took mucinex without much help. Seen 2 months ago with bronchitis his symptoms totally resolved until last week.  Feel bad all over, no energy.  rec Omnicef 300mg  Twice daily  For 10 days  Mucinex DM Twice daily  As needed  Cough/congesiton  Fluids and rest  Tylenol As needed   Saline nasal rinses As needed   Hydromet As needed  For cough, may make you sleepy  06/06/2011 ov/ [name]  Transiently better p last ov then worse x one week assoc with sinus congestion worst p stirring  In am but not disturbing sleep.  rec Make sure you take your nebulizer first thing in am Prednisone 10 mg take  4 each am x 2 days,   2 each am x 2 days,  1 each am x2days and stop  For nasal congestion, drainage, allergies, sneezing  Try zyrtec at bedtime and if can't tolerate it then clariton 10 mg every 24 hours and call if not satisfied.   08/28/2011 f/u ov/[name] cc cough Worse x 4 days, prod with green sputum assoc with nasal congestion but no need for daytime saba or increased sob rec Omnicef 300 mg one twice daily x 10 days See calendar for specific medication instructions and bring it back for each and  every office visit    12/05/2011 f/u ov/[name] cc no change doe x [employer] slow with buggy half the store main complaint is nasal congestion but no purulent sputum or sinus pain, really no cough or increase need for daytime saba. >>steroid taper  02/11/2012 Acute OV  Complains of productive cough with light green mucus along with sinus congestion for 4 days. More dyspnea and wheezing for last week.  OTC not helping.  No hemoptysis. No n/v/d. No increased leg swelling.  Increased SABA use for last few days.  rec Omnicef 300mg  Twice daily  For 7 ays  Mucinex DM Twice daily  As needed  Cough/congesiton  Fluids and rest  Tylenol As needed   Saline nasal rinses As needed   Hydromet As needed     03/04/2012 f/u ov/[name] 66% back to nl then worse again p one week off abx Cc c/o sob, cough with yellow mucus, congestion, and wheezing. Denies chest pain and chest tightness. Using brov/ bud plus saba maybe once daily. rec Augmentin x 10 days take with bfast and supper  Prednisone 10 mg take  4 each am x 2 days,   2 each am x 2 days,  1 each am x2days and stop   03/24/12 [name] ov for sinusitis > rx levaquin > sinus CT > Negative paranasal sinuses.   07/03/2012 f/u ov/[name] using med calendar  consistently much better p rx with levaquin despite nl sinus ct prior to rx, then worse "throat and chest congestion" again x one week prior to OV  , indolent onset  Worse  first thing am no real change doe , mucinex helps some. Started prednisone for UC before congestion flared. rec Levaquin 750 mg one daily x 5 days See calendar for specific medication instructions   07/31/2012 f/u ov/[name]  Brought old med calendar cc Cough good days and bad days - very little mucus (off white) Shortness of Breath with activity about the same,  unchanged Nasal Congestion x 1 wk - most days use proaire avg once a day in afternoon.  No obvious daytime variabilty or assoc  cp or chest tightness, subjective wheeze overt sinus or hb  symptoms. No unusual exp hx   >>no changes   09/11/2012 Follow up and med review  Patient returns for a followup visit and medication review. We reviewed all his medications and organize his medication calendar with  patient education He appears to be taking his meds correctly Does complain of prod cough with white mucus, wheezing, tightness in chest x1 week. A lot of sinus congestion w/ thick discharge. More coughing for last few days. Wheezing worse for 1 day.  No hemoptysis or chest pain . No edema.  >Augmentin x 10 d , and steroid taper   09/23/2012 Acute OV  Returns for persistent cough and congestion Complains of sinus  congestion, PND, throat clearing, small amounts of green mucus, increased SOB, wheezing x2.5weeks - finished augmentin 2 days and pred taper 3 days ago.  Does feel some better but cough is not gone and still has green mucus.  No fever or edema. No hemoptysis.  No n/v/d. Has wheezing on/off    Allergies    1) ! 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  HEENT:   No headaches,  Difficulty swallowing,   Tooth/dental problems, or  Sore throat,                No sneezing, itching, ear ache,  +nasal congestion, post nasal drip,   CV:  No chest pain,  Orthopnea, PND, swelling in lower extremities, anasarca, dizziness, palpitations, syncope.   GI  No heartburn, indigestion, abdominal pain, nausea, vomiting, diarrhea, change in bowel habits, loss of appetite, bloody stools.   Resp:  No wheezing.  No chest wall deformity  Skin: no rash or lesions.  GU: no dysuria, change in color of urine, no urgency or frequency.  No flank pain, no hematuria   MS:  No joint pain or swelling.  No decreased range of motion.   [name]  Psych:  No change in mood or affect. No depression or anxiety.  No memory loss.          Objective:   Physical Exam GEN: A/Ox3; pleasant , NAD, obese  Chronic abn gait  Wt  233 06/06/11  >  241 02/11/2012 > 03/04/2012 239> 07/03/2012  239 > 07/31/2012  240> 247 09/11/2012 >246 09/23/2012  HEENT:  [place]/AT,  EACs-clear, TMs-wnl, NOSE-clear drainage,  Sinus max tenderness  THROAT-clear, no lesions, no postnasal drip or exudate noted. Class 3 airway  NECK:  Supple w/ fair ROM; no JVD; normal carotid impulses w/o bruits; no thyromegaly or nodules palpated; no lymphadenopathy.  RESP  Distant bs, no localized or generalized wheezes or rhonchi  CARD:  RRR, no m/r/g  , no peripheral edema, pulses intact, no cyanosis or clubbing.  GI:   Soft & nt; nml bowel sounds; no organomegaly or masses detected.  Musco: Warm bil, walks with cane   CXR  03/04/2012 :  Stable chest x-ray of mild cardiomegaly and elevation of the left hemidiaphragm. No active process.     Assessment & Plan:

## 2012-09-24 NOTE — Progress Notes (Signed)
Quick Note:  Called spoke with patient, advised of cxr results / recs as stated by TP. Pt verbalized his understanding and denied any questions. ______ 

## 2012-10-08 DIAGNOSIS — Z23 Encounter for immunization: Secondary | ICD-10-CM | POA: Diagnosis not present

## 2012-12-02 DIAGNOSIS — M48061 Spinal stenosis, lumbar region without neurogenic claudication: Secondary | ICD-10-CM | POA: Diagnosis not present

## 2012-12-02 DIAGNOSIS — M1710 Unilateral primary osteoarthritis, unspecified knee: Secondary | ICD-10-CM | POA: Diagnosis not present

## 2012-12-02 DIAGNOSIS — M47817 Spondylosis without myelopathy or radiculopathy, lumbosacral region: Secondary | ICD-10-CM | POA: Diagnosis not present

## 2012-12-17 DIAGNOSIS — H359 Unspecified retinal disorder: Secondary | ICD-10-CM

## 2012-12-17 HISTORY — DX: Unspecified retinal disorder: H35.9

## 2012-12-26 ENCOUNTER — Encounter: Payer: Self-pay | Admitting: Internal Medicine

## 2012-12-26 ENCOUNTER — Ambulatory Visit (INDEPENDENT_AMBULATORY_CARE_PROVIDER_SITE_OTHER): Payer: Medicare Other | Admitting: Internal Medicine

## 2012-12-26 VITALS — BP 130/62 | HR 100 | Temp 98.2°F | Ht 63.0 in | Wt 248.4 lb

## 2012-12-26 DIAGNOSIS — J45909 Unspecified asthma, uncomplicated: Secondary | ICD-10-CM

## 2012-12-26 MED ORDER — PREDNISONE (PAK) 10 MG PO TABS: ORAL_TABLET | ORAL | Status: DC

## 2012-12-26 MED ORDER — LEVOFLOXACIN 750 MG PO TABS: 750.0000 mg | ORAL_TABLET | Freq: Every day | ORAL | Status: DC

## 2012-12-26 NOTE — Progress Notes (Signed)
Subjective:    Patient ID: Barry [name], male    DOB: [birth year] .   MRN: [id number] HPI 2 yowm never smoker with relatively mild asthma but superimposed on obesity plus a chronically paralyzed left hemidiaphragm and a tendency to upper airway cough syndrome.   03/24/12 [name] ov for sinusitis > rx levaquin > sinus CT > Negative paranasal sinuses.  09/23/2012 Acute OV  Returns for persistent cough and congestion Complains of sinus  congestion, PND, throat clearing, small amounts of green mucus, increased SOB, wheezing x2.5weeks - finished augmentin 2 days and pred taper 3 days ago.  Does feel some better but cough is not gone and still has green mucus.  No fever or edema. No hemoptysis.  No n/v/d. Has wheezing on/off  rec Levaquin 750 mg one daily x 5 days Prednisone taper over next week.   Hydromet 1-2 tsp every 6-8 hr As needed   Fluids and rest .    12/26/2012 f/u ov/[name] cc Productive cough with green phlegm, increased SOB, chest tightness, and wheezing x 1 week. Prior to that doing well, using med calendar and rare prns including laba.  Sleeping ok without nocturnal  or early am exacerbation  of respiratory  c/o's or need for noct saba. Also denies any obvious fluctuation of symptoms with weather or environmental changes or other aggravating or alleviating factors except as outlined above   ROS  The following are not active complaints unless bolded sore throat, dysphagia, dental problems, itching, sneezing,  nasal congestion or excess/ purulent secretions, ear ache,   fever, chills, sweats, unintended wt loss, pleuritic or exertional cp, hemoptysis,  orthopnea pnd or leg swelling, presyncope, palpitations, heartburn, abdominal pain, anorexia, nausea, vomiting, diarrhea  or change in bowel or urinary habits, change in stools or urine, dysuria,hematuria,  rash, arthralgias, visual complaints, headache, numbness weakness or ataxia or problems with walking or coordination,  change in  mood/affect or memory.        Allergies    1) ! 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          Objective:   Physical Exam GEN: A/Ox3; pleasant , NAD, obese  Chronic abn gait  Wt  233 06/06/11  >  241 02/11/2012 > 03/04/2012 239> 07/03/2012  239 > 07/31/2012  240> 247 09/11/2012 >246 09/23/2012 > 12/26/2012  248   HEENT:  [place]/AT,  EACs-clear, TMs-wnl, NOSE-clear drainage,  Sinus max tenderness  THROAT-clear, no lesions, no postnasal drip or exudate noted. Class 3 airway  NECK:  Supple w/ fair ROM; no JVD; normal carotid impulses w/o bruits; no thyromegaly or nodules palpated; no lymphadenopathy.  RESP  Distant bs, very minimal wheezes / rhonchi  CARD:  RRR, no m/r/g  , no  peripheral edema, pulses intact, no cyanosis or clubbing.  GI:   Soft & nt; nml bowel sounds; no organomegaly or masses detected.  Musco: Warm bil, walks with cane   CXR  03/04/2012 :  Stable chest x-ray of mild cardiomegaly and elevation of the left hemidiaphragm. No active process.     Assessment & Plan:

## 2012-12-26 NOTE — Patient Instructions (Addendum)
Levaquin 750 x 5 days  Prednisone 10 mg take  4 each am x 2 days,   2 each am x 2 days,  1 each am x2days and stop   See calendar for specific medication instructions and bring it back for each and every office visit for every healthcare provider you see.  Without it,  you may not receive the best quality medical care that we feel you deserve.  You will note that the calendar groups together  your maintenance  medications that are timed at particular times of the day.  Think of this as your checklist for what your doctor has instructed you to do until your next evaluation to see what benefit  there is  to staying on a consistent group of medications intended to keep you well.  The other group at the bottom is entirely up to you to use as you see fit  for specific symptoms that may arise between visits that require you to treat them on an as needed basis.  Think of this as your action plan or "what if" list.   Separating the top medications from the bottom group is fundamental to providing you adequate care going forward.    Please schedule a follow up visit in 3 months but call sooner if needed

## 2012-12-27 NOTE — Assessment & Plan Note (Signed)
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  Reports "levaquin works the best" so rx with levaquin x 5 days only x 6 days pred    Each maintenance medication was reviewed in detail including most importantly the difference between maintenance and as needed and under what circumstances the prns are to be used. This was done in the context of a medication calendar review which provided the patient with a user-friendly unambiguous mechanism for medication administration and reconciliation and provides an action plan for all active problems. It is critical that this be shown to every doctor  for modification during the office visit if necessary so the patient can use it as a working document.

## 2012-12-29 ENCOUNTER — Ambulatory Visit (INDEPENDENT_AMBULATORY_CARE_PROVIDER_SITE_OTHER): Payer: Medicare Other | Admitting: Ophthalmology

## 2012-12-29 DIAGNOSIS — I10 Essential (primary) hypertension: Secondary | ICD-10-CM

## 2012-12-29 DIAGNOSIS — H35349 Macular cyst, hole, or pseudohole, unspecified eye: Secondary | ICD-10-CM

## 2012-12-29 DIAGNOSIS — H35039 Hypertensive retinopathy, unspecified eye: Secondary | ICD-10-CM

## 2012-12-29 DIAGNOSIS — H35379 Puckering of macula, unspecified eye: Secondary | ICD-10-CM

## 2012-12-29 DIAGNOSIS — H43819 Vitreous degeneration, unspecified eye: Secondary | ICD-10-CM

## 2012-12-29 DIAGNOSIS — H2510 Age-related nuclear cataract, unspecified eye: Secondary | ICD-10-CM

## 2013-02-23 DIAGNOSIS — H04129 Dry eye syndrome of unspecified lacrimal gland: Secondary | ICD-10-CM | POA: Diagnosis not present

## 2013-02-23 DIAGNOSIS — H40039 Anatomical narrow angle, unspecified eye: Secondary | ICD-10-CM | POA: Diagnosis not present

## 2013-03-04 ENCOUNTER — Encounter: Payer: Self-pay | Admitting: Internal Medicine

## 2013-03-04 ENCOUNTER — Ambulatory Visit (INDEPENDENT_AMBULATORY_CARE_PROVIDER_SITE_OTHER): Payer: Medicare Other | Admitting: Internal Medicine

## 2013-03-04 VITALS — BP 156/80 | HR 101 | Temp 98.2°F | Ht 66.0 in | Wt 249.0 lb

## 2013-03-04 MED ORDER — HYDROCODONE-HOMATROPINE 5-1.5 MG/5ML PO SYRP: 5.0000 mL | ORAL_SOLUTION | Freq: Four times a day (QID) | ORAL | Status: DC | PRN

## 2013-03-04 MED ORDER — LEVOFLOXACIN 750 MG PO TABS: 750.0000 mg | ORAL_TABLET | Freq: Every day | ORAL | Status: DC

## 2013-03-04 MED ORDER — PREDNISONE (PAK) 10 MG PO TABS: ORAL_TABLET | ORAL | Status: DC

## 2013-03-04 NOTE — Patient Instructions (Addendum)
Levaquin 750 x 5 days  Prednisone 10 mg take  4 each am x 2 days,   2 each am x 2 days,  1 each am x2days and stop   See calendar for specific medication instructions and bring it back for each and every office visit for every healthcare provider you see.  Without it,  you may not receive the best quality medical care that we feel you deserve.  You will note that the calendar groups together  your maintenance  medications that are timed at particular times of the day.  Think of this as your checklist for what your doctor has instructed you to do until your next evaluation to see what benefit  there is  to staying on a consistent group of medications intended to keep you well.  The other group at the bottom is entirely up to you to use as you see fit  for specific symptoms that may arise between visits that require you to treat them on an as needed basis.  Think of this as your action plan or "what if" list.   Separating the top medications from the bottom group is fundamental to providing you adequate care going forward.    Please schedule a follow up visit in 3 months but call sooner if needed

## 2013-03-04 NOTE — Progress Notes (Signed)
Subjective:    Patient ID: Barry [name], male    DOB: [birth year] .   MRN: [id number]    Brief patient profile:  37 yowm never smoker with relatively mild asthma but superimposed on obesity plus a chronically paralyzed left hemidiaphragm and a tendency to upper airway cough syndrome.   03/24/12 [name] ov for sinusitis > rx levaquin > sinus CT > Negative paranasal sinuses.  09/23/2012 Acute OV  Returns for persistent cough and congestion Complains of sinus  congestion, PND, throat clearing, small amounts of green mucus, increased SOB, wheezing x2.5weeks - finished augmentin 2 days and pred taper 3 days ago.  Does feel some better but cough is not gone and still has green mucus.  No fever or edema. No hemoptysis.  No n/v/d. Has wheezing on/off  rec Levaquin 750 mg one daily x 5 days Prednisone taper over next week.   Hydromet 1-2 tsp every 6-8 hr As needed   Fluids and rest .    12/26/2012 f/u ov/Rajeev Escue cc Productive cough with green phlegm, increased SOB, chest tightness, and wheezing x 1 week. Prior to that doing well, using med calendar and rare prns including laba. rec Levaquin 750 x 5 days  Prednisone 10 mg take  4 each am x 2 days,   2 each am x 2 days,  1 each am x2days and stop   03/04/2013 f/u ov/Carleigh Buccieri cc sick x 5 days coughs so hard it hurts midline to back, = to both flanks, no fever but mild chills, usuing prns from med calendar well, doe but not at rest  Sleeping ok without nocturnal  or early am exacerbation  of respiratory  c/o's or need for noct saba. Also denies any obvious fluctuation of symptoms with weather or environmental changes or other aggravating or alleviating factors except as outlined above   ROS  The following are not active complaints unless bolded sore throat, dysphagia, dental problems, itching, sneezing,  nasal congestion or excess/ purulent secretions, ear ache,   fever, chills, sweats, unintended wt loss, pleuritic or exertional cp, hemoptysis,  orthopnea  pnd or leg swelling, presyncope, palpitations, heartburn, abdominal pain, anorexia, nausea, vomiting, diarrhea  or change in bowel or urinary habits, change in stools or urine, dysuria,hematuria,  rash, arthralgias, visual complaints, headache, numbness weakness or ataxia or problems with walking or coordination,  change in mood/affect or memory.        Allergies    1) ! 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          Objective:   Physical Exam GEN: A/Ox3; pleasant , NAD, obese  Chronic abn gait  Wt  233 06/06/11  >  241 02/11/2012 > 03/04/2012 239> 07/03/2012  239 > 07/31/2012  240> 247 09/11/2012 >246 09/23/2012 > 12/26/2012  248 > 03/04/2013 249  HEENT:  [place]/AT,  EACs-clear, TMs-wnl, NOSE-clear drainage,  Sinus max tenderness  THROAT-clear, no lesions, no postnasal drip or exudate noted. Class 3 airway  NECK:  Supple w/ fair ROM; no JVD; normal carotid impulses w/o bruits; no thyromegaly or nodules palpated; no lymphadenopathy.  RESP  Distant bs, very minimal wheezes / rhonchi  CARD:  RRR, no m/r/g  , no peripheral edema, pulses intact, no cyanosis or clubbing.  GI:   Soft & nt; nml bowel sounds; no organomegaly or  masses detected.  Musco: Warm bil, walks with cane   CXR  03/04/2012 :  Stable chest x-ray of mild cardiomegaly and elevation of the left hemidiaphragm. No active process.     Assessment & Plan:

## 2013-03-05 NOTE — Assessment & Plan Note (Signed)
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 This was done in the context of a medication calendar review which provided the patient with a user-friendly unambiguous mechanism for medication administration and reconciliation and provides an action plan for all active problems. It is critical that this be shown to every doctor  for modification during the office visit if necessary so the patient can use it as a working document.      See instructions for specific recommendations which were reviewed directly with the patient who was given a copy with highlighter outlining the key components.

## 2013-03-10 DIAGNOSIS — L988 Other specified disorders of the skin and subcutaneous tissue: Secondary | ICD-10-CM | POA: Diagnosis not present

## 2013-03-11 DIAGNOSIS — L988 Other specified disorders of the skin and subcutaneous tissue: Secondary | ICD-10-CM | POA: Diagnosis not present

## 2013-03-11 DIAGNOSIS — D485 Neoplasm of uncertain behavior of skin: Secondary | ICD-10-CM | POA: Diagnosis not present

## 2013-03-25 ENCOUNTER — Ambulatory Visit (INDEPENDENT_AMBULATORY_CARE_PROVIDER_SITE_OTHER): Payer: Medicare Other | Admitting: Internal Medicine

## 2013-03-25 ENCOUNTER — Encounter: Payer: Self-pay | Admitting: Internal Medicine

## 2013-03-25 VITALS — BP 154/86 | HR 94 | Temp 97.8°F | Ht 63.0 in | Wt 242.8 lb

## 2013-03-25 DIAGNOSIS — J986 Disorders of diaphragm: Secondary | ICD-10-CM | POA: Diagnosis not present

## 2013-03-25 DIAGNOSIS — J45909 Unspecified asthma, uncomplicated: Secondary | ICD-10-CM

## 2013-03-25 MED ORDER — PREDNISONE (PAK) 10 MG PO TABS: ORAL_TABLET | ORAL | Status: DC

## 2013-03-25 NOTE — Progress Notes (Signed)
Subjective:    Patient ID: Barry [name], male    DOB: 69-Nov-1947 .   MRN: [id number]    Brief patient profile:  67 yowm never smoker with relatively mild asthma but superimposed on obesity plus a chronically paralyzed left hemidiaphragm and a tendency to upper airway cough syndrome.   03/24/12 [name] ov for sinusitis > rx levaquin > sinus CT > Negative paranasal sinuses.  09/23/2012 Acute OV  Returns for persistent cough and congestion Complains of sinus  congestion, PND, throat clearing, small amounts of green mucus, increased SOB, wheezing x2.5weeks - finished augmentin 2 days and pred taper 3 days ago.  Does feel some better but cough is not gone and still has green mucus.  No fever or edema. No hemoptysis.  No n/v/d. Has wheezing on/off  rec Levaquin 750 mg one daily x 5 days Prednisone taper over next week.   Hydromet 1-2 tsp every 6-8 hr As needed   Fluids and rest .    12/26/2012 f/u ov/Barry [name] cc Productive cough with green phlegm, increased SOB, chest tightness, and wheezing x 1 week. Prior to that doing well, using med calendar and rare prns including laba. rec Levaquin 750 x 5 days  Prednisone 10 mg take  4 each am x 2 days,   2 each am x 2 days,  1 each am x2days and stop   03/04/2013 f/u ov/Barry [name] cc sick x 5 days coughs so hard it hurts midline to back, = to both flanks, no fever but mild chills, usuing prns from med calendar well, doe but not at rest rec Levaquin 750 x 5 days  Prednisone 10 mg take  4 each am x 2 days,   2 each am x 2 days,  1 each am x2days and stop    03/25/2013  Acute ov/Barry [name] re worse sob Chief Complaint  Patient presents with  . Acute Visit    Pt. reports x4 days ago with a sore throat.  Pt. has some irritation when he coughs being persistent.  Pt. has had some tightness in his chest , sob and some wheezing.   mucus is mostly white. Increased proair to tid, did not know max dose though clealry listed on med calendar under action plan.  No obvious  daytime variabilty or assoc cp or chest tightness, subjective wheeze overt sinus or hb symptoms. No unusual exp hx or h/o childhood pna/ asthma or premature birth to his knowledge.   Sleeping ok without nocturnal  or early am exacerbation  of respiratory  c/o's or need for noct saba. Also denies any obvious fluctuation of symptoms with weather or environmental changes or other aggravating or alleviating factors except as outlined above   ROS  The following are not active complaints unless bolded sore throat, dysphagia, dental problems, itching, sneezing,  nasal congestion or excess/ purulent secretions, ear ache,   fever, chills, sweats, unintended wt loss, pleuritic or exertional cp, hemoptysis,  orthopnea pnd or leg swelling, presyncope, palpitations, heartburn, abdominal pain, anorexia, nausea, vomiting, diarrhea  or change in bowel or urinary habits, change in stools or urine, dysuria,hematuria,  rash, arthralgias, visual complaints, headache, numbness weakness or ataxia or problems with walking or coordination,  change in mood/affect or memory.        Allergies    1) ! 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           Objective:   Physical Exam GEN: A/Ox3; pleasant , NAD, obese  Chronic abn gait  Wt  233 06/06/11  >  241 02/11/2012 > 03/04/2012 239> 07/03/2012  239 > 07/31/2012  240> 247 09/11/2012 >246 09/23/2012 > 12/26/2012  248 > 03/04/2013 249 > 03/25/2013  243   HEENT:  [place]/AT,  EACs-clear, TMs-wnl, NOSE-clear drainage,  Sinus max tenderness  THROAT-clear, no lesions, no postnasal drip or exudate noted. Class 3 airway  NECK:  Supple w/ fair ROM; no JVD; normal carotid impulses w/o bruits; no thyromegaly or nodules palpated; no lymphadenopathy.  RESP  Distant bs, bilateral mid to late exp wheezes   CARD:  RRR, no m/r/g  , no peripheral edema, pulses intact, no cyanosis or clubbing.  GI:   Soft & nt; nml bowel sounds; no organomegaly or masses detected.  Musco: Warm bil, walks with cane   CXR  09/23/13 1. No radiographic evidence of acute cardiopulmonary disease. The  appearance of the chest is essentially unchanged, as above.      Assessment & Plan:

## 2013-03-25 NOTE — Patient Instructions (Addendum)
Work on inhaler technique:  relax and gently blow all the way out then take a nice smooth deep breath back in, triggering the inhaler at same time you start breathing in.  Hold for up to 5 seconds if you can.  Rinse and gargle with water when done   If your mouth or throat starts to bother you,   I suggest you time the inhaler to your dental care and after using the inhaler(s) brush teeth and tongue with a baking soda containing toothpaste and when you rinse this out, gargle with it first to see if this helps your mouth and throat.     Prednisone 10 mg :Take 4 for two days three for two days two for two days one for two days   Whenever having any kind of flare of any respiratory symptoms like you are now >  Add a second dose of prilosec 30 min before supper  See [name] NP w/in 6 weeks with all your medications, even over the counter meds, separated in two separate bags, the ones you take no matter what vs the ones you stop once you feel better and take only as needed when you feel you need them.   [name]  will generate for you a new user friendly medication calendar that will put [place] all on the same page re: your medication use.     Without this process, it simply isn't possible to assure that we are providing  your outpatient care  with  the attention to detail we feel you deserve.   If we cannot assure that you're getting that kind of care,  then we cannot manage your problem effectively from this clinic.  Once you have seen [name] and we are sure that we're all on the same page with your medication use she will arrange follow up with me.

## 2013-03-25 NOTE — Assessment & Plan Note (Signed)
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  - HFA 25% August 01, 2009 > 50% November 01, 2009 > 50% January 31, 2010 therefore changed to neb bud/brovana - HFA 75% 08/28/2011 > 75% 03/25/2013  -med calendar 09/11/2012   Mild acute exac s purulent sputum   ? Reflux related > rx max ppi for exac  Prednisone x 8 day course  The proper method of use, as well as anticipated side effects, of a metered-dose inhaler are discussed and demonstrated to the patient. Improved effectiveness after extensive coaching during this visit to a level of approximately  75%, reviewed max dose prn with goal of less than twice weekly    Each maintenance medication was reviewed in detail including most importantly the difference between maintenance and as needed and under what circumstances the prns are to be used. This was done in the context of a medication calendar review which provided the patient with a user-friendly unambiguous mechanism for medication administration and reconciliation and provides an action plan for all active problems. It is critical that this be shown to every doctor  for modification during the office visit if necessary so the patient can use it as a working document.

## 2013-03-25 NOTE — Assessment & Plan Note (Signed)
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

## 2013-05-07 ENCOUNTER — Encounter: Payer: Self-pay | Admitting: Adult Health

## 2013-05-07 ENCOUNTER — Ambulatory Visit (INDEPENDENT_AMBULATORY_CARE_PROVIDER_SITE_OTHER): Payer: Medicare Other | Admitting: Adult Health

## 2013-05-07 VITALS — BP 126/64 | HR 77 | Temp 99.3°F | Ht 65.0 in | Wt 242.8 lb

## 2013-05-07 DIAGNOSIS — J45909 Unspecified asthma, uncomplicated: Secondary | ICD-10-CM | POA: Diagnosis not present

## 2013-05-07 MED ORDER — PREDNISONE 10 MG PO TABS: ORAL_TABLET | ORAL | Status: DC

## 2013-05-07 MED ORDER — CEFDINIR 300 MG PO CAPS: 300.0000 mg | ORAL_CAPSULE | Freq: Two times a day (BID) | ORAL | Status: DC

## 2013-05-07 NOTE — Addendum Note (Signed)
Addended by: Boone Master E on: 05/07/2013 04:00 PM   Modules accepted: Medications

## 2013-05-07 NOTE — Patient Instructions (Addendum)
Omnicef 300mg  Twice daily  For 10 days  Prednisone taper over next week  Follow med calendar closely and bring to each visit.  Debrox ear wax drops as needed.  Follow up Dr. Sherene Sires  In 3 months and As needed

## 2013-05-07 NOTE — Progress Notes (Signed)
Subjective:    Patient ID: Barry [name], male    DOB: 10-18-1946 .   MRN: [id number]    Brief patient profile:  67 yowm never smoker with relatively mild asthma but superimposed on obesity plus a chronically paralyzed left hemidiaphragm and a tendency to upper airway cough syndrome.   03/24/12 [name] ov for sinusitis > rx levaquin > sinus CT > Negative paranasal sinuses.  09/23/2012 Acute OV  Returns for persistent cough and congestion Complains of sinus  congestion, PND, throat clearing, small amounts of green mucus, increased SOB, wheezing x2.5weeks - finished augmentin 2 days and pred taper 3 days ago.  Does feel some better but cough is not gone and still has green mucus.  No fever or edema. No hemoptysis.  No n/v/d. Has wheezing on/off  rec Levaquin 750 mg one daily x 5 days Prednisone taper over next week.   Hydromet 1-2 tsp every 6-8 hr As needed   Fluids and rest .    12/26/2012 f/u ov/[name] cc Productive cough with green phlegm, increased SOB, chest tightness, and wheezing x 1 week. Prior to that doing well, using med calendar and rare prns including laba. rec Levaquin 750 x 5 days  Prednisone 10 mg take  4 each am x 2 days,   2 each am x 2 days,  1 each am x2days and stop   03/04/2013 f/u ov/[name] cc sick x 5 days coughs so hard it hurts midline to back, = to both flanks, no fever but mild chills, usuing prns from med calendar well, doe but not at rest rec Levaquin 750 x 5 days  Prednisone 10 mg take  4 each am x 2 days,   2 each am x 2 days,  1 each am x2days and stop    03/25/2013  Acute ov/[name] re worse sob Chief Complaint  Patient presents with  . Acute Visit    Pt. reports x4 days ago with a sore throat.  Pt. has some irritation when he coughs being persistent.  Pt. has had some tightness in his chest , sob and some wheezing.   mucus is mostly white. Increased proair to tid, did not know max dose though clealry listed on med calendar under action plan.  >>pred pack    05/07/2013 Follow up and Med calendar  Returns for follow up and med calendar  We reviewed all his meds and organized them into a med calendar with pt education.  Complains of prod cough with green mucus, wheezing, increased SOB, chest tightness w/ coughing x1 week, denies f/c/s. Had flare 2 months ago, tx w/ steroid taper- improved until last 1 week.  No chest pain, edema, orthopnea or n/v.  Complains ears are stopped up.     Allergies    1) ! 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          Objective:   Physical Exam GEN: A/Ox3; pleasant , NAD, obese  Chronic abn gait  Wt  233 06/06/11  >  241 02/11/2012 > 03/04/2012 239> 07/03/2012  239 > 07/31/2012  240> 247 09/11/2012 >246 09/23/2012 > 12/26/2012  248 > 03/04/2013 249 > 03/25/2013  243 >242 05/07/2013   HEENT:  [place]/AT,  EACs-bilateral wax impaction  NOSE-clear drainage,  Sinus max tenderness  THROAT-clear, no lesions, no postnasal drip or exudate noted. Class 3 airway  NECK:  Supple w/ fair ROM; no JVD; normal carotid impulses w/o bruits; no thyromegaly or nodules palpated; no lymphadenopathy.  RESP  Distant bs, bilateral mid to late exp wheezes   CARD:  RRR, no  m/r/g  , no peripheral edema, pulses intact, no cyanosis or clubbing.  GI:   Soft & nt; nml bowel sounds; no organomegaly or masses detected.  Musco: Warm bil, walks with cane   CXR  09/23/13 1. No radiographic evidence of acute cardiopulmonary disease. The  appearance of the chest is essentially unchanged, as above.      Assessment & Plan:

## 2013-05-07 NOTE — Assessment & Plan Note (Addendum)
Flare with associated sinusitis and bronchitis  Patient's medications were reviewed today and patient education was given. Computerized medication calendar was adjusted/completed   Ear irrigation w/ wax extraction , tolerated well w/ nml TM/canals   Plan  Omnicef 300mg  Twice daily  For 10 days  Prednisone taper over next week  Follow med calendar closely and bring to each visit.  Debrox ear wax drops as needed.  Follow up Dr. Sherene Sires  In 3 months and As needed

## 2013-05-25 DIAGNOSIS — K5130 Ulcerative (chronic) rectosigmoiditis without complications: Secondary | ICD-10-CM | POA: Diagnosis not present

## 2013-05-25 DIAGNOSIS — I2510 Atherosclerotic heart disease of native coronary artery without angina pectoris: Secondary | ICD-10-CM | POA: Diagnosis not present

## 2013-05-25 DIAGNOSIS — Z0000 Encounter for general adult medical examination without abnormal findings: Secondary | ICD-10-CM | POA: Diagnosis not present

## 2013-05-25 DIAGNOSIS — J45909 Unspecified asthma, uncomplicated: Secondary | ICD-10-CM | POA: Diagnosis not present

## 2013-05-25 DIAGNOSIS — I10 Essential (primary) hypertension: Secondary | ICD-10-CM | POA: Diagnosis not present

## 2013-06-02 DIAGNOSIS — M48061 Spinal stenosis, lumbar region without neurogenic claudication: Secondary | ICD-10-CM | POA: Diagnosis not present

## 2013-06-02 DIAGNOSIS — M47817 Spondylosis without myelopathy or radiculopathy, lumbosacral region: Secondary | ICD-10-CM | POA: Diagnosis not present

## 2013-06-02 DIAGNOSIS — M1710 Unilateral primary osteoarthritis, unspecified knee: Secondary | ICD-10-CM | POA: Diagnosis not present

## 2013-06-26 DIAGNOSIS — H40039 Anatomical narrow angle, unspecified eye: Secondary | ICD-10-CM | POA: Diagnosis not present

## 2013-07-13 ENCOUNTER — Encounter: Payer: Self-pay | Admitting: Internal Medicine

## 2013-07-13 ENCOUNTER — Ambulatory Visit (INDEPENDENT_AMBULATORY_CARE_PROVIDER_SITE_OTHER): Payer: Medicare Other | Admitting: Internal Medicine

## 2013-07-13 VITALS — BP 126/64 | HR 96 | Temp 98.3°F | Ht 65.0 in | Wt 242.0 lb

## 2013-07-13 DIAGNOSIS — J45909 Unspecified asthma, uncomplicated: Secondary | ICD-10-CM | POA: Diagnosis not present

## 2013-07-13 MED ORDER — BUDESONIDE-FORMOTEROL FUMARATE 160-4.5 MCG/ACT IN AERO: INHALATION_SPRAY | RESPIRATORY_TRACT | Status: DC

## 2013-07-13 MED ORDER — PREDNISONE (PAK) 10 MG PO TABS: ORAL_TABLET | ORAL | Status: DC

## 2013-07-13 MED ORDER — AMOXICILLIN-POT CLAVULANATE 875-125 MG PO TABS: 1.0000 | ORAL_TABLET | Freq: Two times a day (BID) | ORAL | Status: DC

## 2013-07-13 MED ORDER — TRAMADOL HCL 50 MG PO TABS: ORAL_TABLET | ORAL | Status: DC

## 2013-07-13 NOTE — Patient Instructions (Addendum)
Take mucinex dm 1200 mg  every 12 hours and supplement if needed with  tramadol 50 mg up to 2 every 4 hours to suppress the urge to cough. Swallowing water or using ice chips/non mint and menthol containing candies (such as lifesavers or sugarless jolly ranchers) are also effective.  You should rest your voice and avoid activities that you know make you cough.  Once you have eliminated the cough for 3 straight days try reducing the tramadol first,  then the mucinex dm  Augmentin x 10 days and call [facility] at [phone] if not satisfied to schedule a sinus CT  Prednisone 10 mg take  4 each am x 2 days,   2 each am x 2 days,  1 each am x 2 days and stop   See [place] back Please schedule a follow up office visit in 4 weeks, sooner if needed

## 2013-07-13 NOTE — Progress Notes (Signed)
Subjective:    Patient ID: Barry [name], male    DOB: 09-12-1946 .   MRN: [id number]    Brief patient profile:  67 yowm never smoker with relatively mild asthma but superimposed on obesity plus a chronically paralyzed left hemidiaphragm and a tendency to upper airway cough syndrome.   03/24/12 [name] ov for sinusitis > rx levaquin > sinus CT > Negative paranasal sinuses.  09/23/2012 Acute OV  Returns for persistent cough and congestion Complains of sinus  congestion, PND, throat clearing, small amounts of green mucus, increased SOB, wheezing x2.5weeks - finished augmentin 2 days and pred taper 3 days ago.  Does feel some better but cough is not gone and still has green mucus.  No fever or edema. No hemoptysis.  No n/v/d. Has wheezing on/off  rec Levaquin 750 mg one daily x 5 days Prednisone taper over next week.   Hydromet 1-2 tsp every 6-8 hr As needed   Fluids and rest .    12/26/2012 f/u ov/Barry [name] cc Productive cough with green phlegm, increased SOB, chest tightness, and wheezing x 1 week. Prior to that doing well, using med calendar and rare prns including laba. rec Levaquin 750 x 5 days  Prednisone 10 mg take  4 each am x 2 days,   2 each am x 2 days,  1 each am x2days and stop   03/04/2013 f/u ov/Barry [name] cc sick x 5 days coughs so hard it hurts midline to back, = to both flanks, no fever but mild chills, usuing prns from med calendar well, doe but not at rest rec Levaquin 750 x 5 days  Prednisone 10 mg take  4 each am x 2 days,   2 each am x 2 days,  1 each am x2days and stop    03/25/2013  Acute ov/Barry [name] re worse sob Chief Complaint  Patient presents with  . Acute Visit    Pt. reports x4 days ago with a sore throat.  Pt. has some irritation when he coughs being persistent.  Pt. has had some tightness in his chest , sob and some wheezing.   mucus is mostly white. Increased proair to tid, did not know max dose though clealry listed on med calendar under action plan.  >>pred pack    05/07/2013 Follow up and Med calendar  Returns for follow up and med calendar  We reviewed all his meds and organized them into a med calendar with pt education.  Complains of prod cough with green mucus, wheezing, increased SOB, chest tightness w/ coughing x1 week, denies f/c/s. Had flare 2 months ago, tx w/ steroid taper- improved until last 1 week.  Complains ears are stopped up rec Omnicef 300mg  Twice daily  For 10 days  Prednisone taper over next week  Follow med calendar closely and bring to each visit.  Debrox ear wax drops as needed.   07/13/2013 acute ov/Barry [name] acute ill x 4 days Chief Complaint  Patient presents with  . Acute Visit    Pt c/o increased cough x 4 days- prod with minimal thick, clear sputum. He also c/o increasing dyspnea since cough has worsened.   mucus does have some green discoloration, using max mucinex dm and proair up to every 6 with improvement doe but not back to baseline  No obvious daytime variabilty or assoc chronic cough or cp or chest tightness, subjective wheeze overt sinus or hb symptoms. No unusual exp hx or h/o childhood pna/ asthma or knowledge of premature birth.  Sleeping ok without nocturnal  or early am exacerbation  of respiratory  c/o's or need for noct saba. Also denies any obvious fluctuation of symptoms with weather or environmental changes or other aggravating or alleviating factors except as outlined above   Current Medications, Allergies, Complete Past Medical History, Past Surgical History, Family History, and Social History were reviewed in [employer] record.  ROS  The following are not active complaints unless bolded sore throat, dysphagia, dental problems, itching, sneezing,  nasal congestion or excess/ purulent secretions, ear ache,   fever, chills, sweats, unintended wt loss, pleuritic or exertional cp, hemoptysis,  orthopnea pnd or leg swelling, presyncope, palpitations, heartburn, abdominal pain,  anorexia, nausea, vomiting, diarrhea  or change in bowel or urinary habits, change in stools or urine, dysuria,hematuria,  rash, arthralgias, visual complaints, headache, numbness weakness or ataxia or problems with walking or coordination,  change in mood/affect or memory.            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          Objective:   Physical Exam GEN: A/Ox3; pleasant , NAD, obese  Chronic abn gait  Wt  233 06/06/11  >  241 02/11/2012 > 03/04/2012 239> 07/03/2012  239 > 07/31/2012  240> 247 09/11/2012 >246 09/23/2012 > 12/26/2012  248 > 03/04/2013 249 > 03/25/2013  243 >242 05/07/2013  > 07/13/2013  242   HEENT:  [place]/AT,  EACs-bilateral wax impaction  NOSE-clear drainage,  Sinus max tenderness  THROAT-clear, no lesions, no postnasal drip or exudate noted. Class 3 airway  NECK:  Supple w/ fair ROM; no JVD; normal carotid impulses w/o bruits; no thyromegaly or nodules palpated; no lymphadenopathy.  RESP  Distant bs, bilateral mid to late exp wheezes   CARD:  RRR, no m/r/g  , no peripheral edema, pulses intact, no cyanosis or clubbing.  GI:   Soft & nt; nml bowel sounds; no organomegaly or masses detected.  Musco: Warm bil,  walks with cane   CXR  09/23/13 1. No radiographic evidence of acute cardiopulmonary disease. The  appearance of the chest is essentially unchanged, as above.      Assessment & Plan:

## 2013-07-14 NOTE — Assessment & Plan Note (Addendum)
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  - HFA 25% August 01, 2009 > 50% November 01, 2009 > 50% January 31, 2010 therefore changed to neb bud/brovana - HFA 75% 08/28/2011 > 75% 03/25/2013   DDX of  difficult airways managment all start with A and  include Adherence, Ace Inhibitors, Acid Reflux, Active Sinus Disease, Alpha 1 Antitripsin deficiency, Anxiety masquerading as Airways dz,  ABPA,  allergy(esp in young), Aspiration (esp in elderly), Adverse effects of DPI,  Active smokers, plus two Bs  = Bronchiectasis and Beta blocker use..and one C= CHF. The proper method of use, as well as anticipated side effects, of a metered-dose inhaler are discussed and demonstrated to the patient. Improved effectiveness after extensive coaching during this visit to a level of approximately  75%  ? Active sinus dz > rx augmentin then sinus CT if not 100%   ? Acid (or non Acid) GERD > rx reviewed.    Each maintenance medication was reviewed in detail including most importantly the difference between maintenance and as needed and under what circumstances the prns are to be used. This was done in the context of a medication calendar review which provided the patient with a user-friendly unambiguous mechanism for medication administration and reconciliation and provides an action plan for all active problems. It is critical that this be shown to every doctor  for modification during the office visit if necessary so the patient can use it as a working document.

## 2013-07-22 DIAGNOSIS — K5130 Ulcerative (chronic) rectosigmoiditis without complications: Secondary | ICD-10-CM | POA: Diagnosis not present

## 2013-07-23 DIAGNOSIS — K5130 Ulcerative (chronic) rectosigmoiditis without complications: Secondary | ICD-10-CM | POA: Diagnosis not present

## 2013-08-05 DIAGNOSIS — K5130 Ulcerative (chronic) rectosigmoiditis without complications: Secondary | ICD-10-CM | POA: Diagnosis not present

## 2013-08-07 ENCOUNTER — Ambulatory Visit (INDEPENDENT_AMBULATORY_CARE_PROVIDER_SITE_OTHER): Payer: Medicare Other | Admitting: Internal Medicine

## 2013-08-07 ENCOUNTER — Encounter: Payer: Self-pay | Admitting: Internal Medicine

## 2013-08-07 ENCOUNTER — Ambulatory Visit (INDEPENDENT_AMBULATORY_CARE_PROVIDER_SITE_OTHER)
Admission: RE | Admit: 2013-08-07 | Discharge: 2013-08-07 | Disposition: A | Discharge status: Home / Self Care | Payer: Medicare Other | Source: Ambulatory Visit | Attending: Internal Medicine | Admitting: Internal Medicine

## 2013-08-07 VITALS — BP 156/72 | HR 112 | Temp 99.4°F | Ht 65.0 in | Wt 238.0 lb

## 2013-08-07 DIAGNOSIS — J45909 Unspecified asthma, uncomplicated: Secondary | ICD-10-CM | POA: Diagnosis not present

## 2013-08-07 DIAGNOSIS — J3489 Other specified disorders of nose and nasal sinuses: Secondary | ICD-10-CM | POA: Diagnosis not present

## 2013-08-07 DIAGNOSIS — J019 Acute sinusitis, unspecified: Secondary | ICD-10-CM

## 2013-08-07 DIAGNOSIS — J310 Chronic rhinitis: Secondary | ICD-10-CM

## 2013-08-07 NOTE — Progress Notes (Signed)
Subjective:    Patient ID: Barry [name], male    DOB: 02/04/1946 .   MRN: [id number]    Brief patient profile:  67 yowm never smoker with relatively mild asthma but superimposed on obesity plus a chronically paralyzed left hemidiaphragm and a tendency to upper airway cough syndrome.   03/24/12 [name] ov for sinusitis > rx levaquin > sinus CT > Negative paranasal sinuses.  09/23/2012 Acute OV  Returns for persistent cough and congestion Complains of sinus  congestion, PND, throat clearing, small amounts of green mucus, increased SOB, wheezing x2.5weeks - finished augmentin 2 days and pred taper 3 days ago.  Does feel some better but cough is not gone and still has green mucus.  No fever or edema. No hemoptysis.  No n/v/d. Has wheezing on/off  rec Levaquin 750 mg one daily x 5 days Prednisone taper over next week.   Hydromet 1-2 tsp every 6-8 hr As needed   Fluids and rest .    12/26/2012 f/u ov/[name] cc Productive cough with green phlegm, increased SOB, chest tightness, and wheezing x 1 week. Prior to that doing well, using med calendar and rare prns including laba. rec Levaquin 750 x 5 days  Prednisone 10 mg take  4 each am x 2 days,   2 each am x 2 days,  1 each am x2days and stop   03/04/2013 f/u ov/[name] cc sick x 5 days coughs so hard it hurts midline to back, = to both flanks, no fever but mild chills, usuing prns from med calendar well, doe but not at rest rec Levaquin 750 x 5 days  Prednisone 10 mg take  4 each am x 2 days,   2 each am x 2 days,  1 each am x2days and stop    03/25/2013  Acute ov/[name] re worse sob Chief Complaint  Patient presents with  . Acute Visit    Pt. reports x4 days ago with a sore throat.  Pt. has some irritation when he coughs being persistent.  Pt. has had some tightness in his chest , sob and some wheezing.   mucus is mostly white. Increased proair to tid, did not know max dose though clealry listed on med calendar under action plan.  >>pred pack    05/07/2013 Follow up and Med calendar  Returns for follow up and med calendar  We reviewed all his meds and organized them into a med calendar with pt education.  Complains of prod cough with green mucus, wheezing, increased SOB, chest tightness w/ coughing x1 week, denies f/c/s. Had flare 2 months ago, tx w/ steroid taper- improved until last 1 week.  Complains ears are stopped up rec Omnicef 300mg  Twice daily  For 10 days  Prednisone taper over next week  Follow med calendar closely and bring to each visit.  Debrox ear wax drops as needed.   07/13/2013 acute ov/[name] acute ill x 4 days Chief Complaint  Patient presents with  . Acute Visit    Pt c/o increased cough x 4 days- prod with minimal thick, clear sputum. He also c/o increasing dyspnea since cough has worsened.   mucus does have some green discoloration, using max mucinex dm and proair up to every 6 with improvement doe but not back to baseline. rec Take mucinex dm 1200 mg  every 12 hours and supplement if needed with  tramadol 50 mg up to 2 every 4 hours to suppress the urge to cough.   Once you have  eliminated the cough for 3 straight days try reducing the tramadol first,  then the mucinex dm Augmentin x 10 days and call libby at [phone] if not satisfied to schedule a sinus CT Prednisone 10 mg take  4 each am x 2 days,   2 each am x 2 days,  1 each am x 2 days and stop    08/07/2013 f/u ov/[name] re AB/ UC flare on prednisone taper  Chief Complaint  Patient presents with  . Asthma    Breathing is slightly improved. Cough has improved. Mucus production of green sputum.   most of the mucus is first thing in am before even sitting up but doesn't typically wake him prematurely and no need for rescue rx on both bud/perf/symbicort albeit on high dose streroids per Dr Danise Edge for UC  No obvious daytime variabilty or assoc  cp or chest tightness, subjective wheeze overt sinus or hb symptoms. No unusual exp hx or h/o childhood  pna/ asthma or knowledge of premature birth.   Sleeping ok without nocturnal  or early am exacerbation  of respiratory  c/o's or need for noct saba. Also denies any obvious fluctuation of symptoms with weather or environmental changes or other aggravating or alleviating factors except as outlined above   Current Medications, Allergies, Complete Past Medical History, Past Surgical History, Family History, and Social History were reviewed in [employer] record.  ROS  The following are not active complaints unless bolded sore throat, dysphagia, dental problems, itching, sneezing,  nasal congestion or excess/ purulent secretions, ear ache,   fever, chills, sweats, unintended wt loss, pleuritic or exertional cp, hemoptysis,  orthopnea pnd or leg swelling, presyncope, palpitations, heartburn, abdominal pain, anorexia, nausea, vomiting, diarrhea  or change in bowel or urinary habits, change in stools or urine, dysuria,hematuria,  rash, arthralgias, visual complaints, headache, numbness weakness or ataxia or problems with walking or coordination,  change in mood/affect or memory.            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  no alcohol  single  no children  disabled:  textiles    .          Objective:   Physical Exam GEN: A/Ox3; pleasant , NAD, obese  Chronic abn gait  Wt  233 06/06/11   Wt Readings from Last 3 Encounters:  08/07/13 238 lb (107.956 kg)  07/13/13 242 lb (109.77 kg)  05/07/13 242 lb 12.8 oz (110.133 kg)     HEENT:  [place]/AT,  EACs-bilateral wax impaction  NOSE-clear drainage,  Sinus max tenderness  THROAT-clear, no lesions, no postnasal drip or exudate noted. Class 3 airway  NECK:  Supple w/ fair ROM; no JVD; normal carotid impulses w/o bruits; no thyromegaly or nodules palpated; no lymphadenopathy.  RESP  Clear to A and P bilaterally   CARD:  RRR, no m/r/g  , no peripheral edema, pulses intact, no cyanosis or clubbing.  GI:   Soft & nt; nml bowel sounds; no organomegaly or masses detected.  Musco: Warm bil, walks with cane        sinus ct 08/07/2013  Short air-fluid levels in the maxillary sinuses bilaterally suggesting early acute sinusitis.  Assessment & Plan:

## 2013-08-07 NOTE — Patient Instructions (Addendum)
Continue symbicort 160 Take 2 puffs first thing in am and then another 2 puffs about 12 hours later and try off the nebulizer    Only use your albuterol as a rescue medication to be used if you can't catch your breath by resting or doing a relaxed purse lip breathing pattern. The less you use it, the better it will work when you need it. Ok to use up to every 4 hours   Work on inhaler technique:  relax and gently blow all the way out then take a nice smooth deep breath back in, triggering the inhaler at same time you start breathing in.  Hold for up to 5 seconds if you can.  Rinse and gargle with water when done   If your mouth or throat starts to bother you,   I suggest you time the inhaler to your dental care and after using the inhaler(s) brush teeth and tongue with a baking soda containing toothpaste and when you rinse this out, gargle with it first to see if this helps your mouth and throat.     Please see patient coordinator before you leave today  to schedule sinus CT   Please schedule a follow up office visit in 6 weeks to see [name] NP , call sooner if needed  Add augmentin 875 bid x 21 days called in for acute sinusitis

## 2013-08-07 NOTE — Assessment & Plan Note (Signed)
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  - HFA 25% August 01, 2009 > 50% November 01, 2009 > 50% January 31, 2010 therefore changed to neb bud/brovana  The proper method of use, as well as anticipated side effects, of a metered-dose inhaler are discussed and demonstrated to the patient. Improved effectiveness after extensive coaching during this visit to a level of approximately  75%  [employer] requesting change to symbicort 160 2bid and doing fine at present albeit on high dose pred for uc > observe for flare as taper

## 2013-08-07 NOTE — Assessment & Plan Note (Signed)
-   CT sinus 08/07/2013  Short air-fluid levels in the maxillary sinuses bilaterally suggesting early acute sinusitis.  rx augmentin x 21 days, precautions given re diluting this out to prevent non-uc diarrhea.

## 2013-08-10 ENCOUNTER — Other Ambulatory Visit: Payer: Self-pay | Admitting: Internal Medicine

## 2013-08-10 MED ORDER — AMOXICILLIN-POT CLAVULANATE 875-125 MG PO TABS: 1.0000 | ORAL_TABLET | Freq: Two times a day (BID) | ORAL | Status: DC

## 2013-08-10 NOTE — Progress Notes (Signed)
Quick Note:  Spoke with pt and notified of results per Dr. [name]. Pt verbalized understanding and denied any questions.  ______ 

## 2013-08-14 DIAGNOSIS — E785 Hyperlipidemia, unspecified: Secondary | ICD-10-CM | POA: Diagnosis not present

## 2013-08-14 DIAGNOSIS — E669 Obesity, unspecified: Secondary | ICD-10-CM | POA: Diagnosis not present

## 2013-08-14 DIAGNOSIS — I252 Old myocardial infarction: Secondary | ICD-10-CM | POA: Diagnosis not present

## 2013-08-14 DIAGNOSIS — I10 Essential (primary) hypertension: Secondary | ICD-10-CM | POA: Diagnosis not present

## 2013-08-14 DIAGNOSIS — I2510 Atherosclerotic heart disease of native coronary artery without angina pectoris: Secondary | ICD-10-CM | POA: Diagnosis not present

## 2013-08-28 DIAGNOSIS — H1045 Other chronic allergic conjunctivitis: Secondary | ICD-10-CM | POA: Diagnosis not present

## 2013-08-28 DIAGNOSIS — H409 Unspecified glaucoma: Secondary | ICD-10-CM | POA: Diagnosis not present

## 2013-08-28 DIAGNOSIS — H402290 Chronic angle-closure glaucoma, unspecified eye, stage unspecified: Secondary | ICD-10-CM | POA: Diagnosis not present

## 2013-08-28 DIAGNOSIS — H04129 Dry eye syndrome of unspecified lacrimal gland: Secondary | ICD-10-CM | POA: Diagnosis not present

## 2013-09-14 DIAGNOSIS — R197 Diarrhea, unspecified: Secondary | ICD-10-CM | POA: Diagnosis not present

## 2013-09-14 DIAGNOSIS — K921 Melena: Secondary | ICD-10-CM | POA: Diagnosis not present

## 2013-09-15 DIAGNOSIS — R197 Diarrhea, unspecified: Secondary | ICD-10-CM | POA: Diagnosis not present

## 2013-09-15 DIAGNOSIS — K921 Melena: Secondary | ICD-10-CM | POA: Diagnosis not present

## 2013-09-18 ENCOUNTER — Encounter: Payer: Self-pay | Admitting: Adult Health

## 2013-09-18 ENCOUNTER — Ambulatory Visit (INDEPENDENT_AMBULATORY_CARE_PROVIDER_SITE_OTHER): Payer: Medicare Other | Admitting: Adult Health

## 2013-09-18 VITALS — BP 134/86 | HR 85 | Temp 98.7°F | Ht 65.0 in | Wt 233.8 lb

## 2013-09-18 DIAGNOSIS — J310 Chronic rhinitis: Secondary | ICD-10-CM

## 2013-09-18 DIAGNOSIS — J45909 Unspecified asthma, uncomplicated: Secondary | ICD-10-CM

## 2013-09-18 DIAGNOSIS — Z23 Encounter for immunization: Secondary | ICD-10-CM | POA: Diagnosis not present

## 2013-09-18 MED ORDER — AEROCHAMBER MV MISC: Status: DC

## 2013-09-18 NOTE — Progress Notes (Signed)
Subjective:    Patient ID: Barry [name], male    DOB: 12-03-1946 .   MRN: [id number]  Brief patient profile:  67 yowm never smoker with relatively mild asthma but superimposed on obesity plus a chronically paralyzed left hemidiaphragm and a tendency to upper airway cough syndrome.   03/24/12 [name] ov for sinusitis > rx levaquin > sinus CT > Negative paranasal sinuses.  09/23/2012 Acute OV  Returns for persistent cough and congestion Complains of sinus  congestion, PND, throat clearing, small amounts of green mucus, increased SOB, wheezing x2.5weeks - finished augmentin 2 days and pred taper 3 days ago.  Does feel some better but cough is not gone and still has green mucus.  No fever or edema. No hemoptysis.  No n/v/d. Has wheezing on/off  rec Levaquin 750 mg one daily x 5 days Prednisone taper over next week.   Hydromet 1-2 tsp every 6-8 hr As needed   Fluids and rest .    12/26/2012 f/u ov/[name] cc Productive cough with green phlegm, increased SOB, chest tightness, and wheezing x 1 week. Prior to that doing well, using med calendar and rare prns including laba. rec Levaquin 750 x 5 days  Prednisone 10 mg take  4 each am x 2 days,   2 each am x 2 days,  1 each am x2days and stop   03/04/2013 f/u ov/[name] cc sick x 5 days coughs so hard it hurts midline to back, = to both flanks, no fever but mild chills, usuing prns from med calendar well, doe but not at rest rec Levaquin 750 x 5 days  Prednisone 10 mg take  4 each am x 2 days,   2 each am x 2 days,  1 each am x2days and stop    03/25/2013  Acute ov/[name] re worse sob Chief Complaint  Patient presents with  . Acute Visit    Pt. reports x4 days ago with a sore throat.  Pt. has some irritation when he coughs being persistent.  Pt. has had some tightness in his chest , sob and some wheezing.   mucus is mostly white. Increased proair to tid, did not know max dose though clealry listed on med calendar under action plan.  >>pred pack    05/07/2013 Follow up and Med calendar  Returns for follow up and med calendar  We reviewed all his meds and organized them into a med calendar with pt education.  Complains of prod cough with green mucus, wheezing, increased SOB, chest tightness w/ coughing x1 week, denies f/c/s. Had flare 2 months ago, tx w/ steroid taper- improved until last 1 week.  Complains ears are stopped up rec Omnicef 300mg  Twice daily  For 10 days  Prednisone taper over next week  Follow med calendar closely and bring to each visit.  Debrox ear wax drops as needed.   07/13/2013 acute ov/[name] acute ill x 4 days Chief Complaint  Patient presents with  . Acute Visit    Pt c/o increased cough x 4 days- prod with minimal thick, clear sputum. He also c/o increasing dyspnea since cough has worsened.   mucus does have some green discoloration, using max mucinex dm and proair up to every 6 with improvement doe but not back to baseline. rec Take mucinex dm 1200 mg  every 12 hours and supplement if needed with  tramadol 50 mg up to 2 every 4 hours to suppress the urge to cough.   Once you have eliminated the  cough for 3 straight days try reducing the tramadol first,  then the mucinex dm Augmentin x 10 days and call libby at [phone] if not satisfied to schedule a sinus CT Prednisone 10 mg take  4 each am x 2 days,   2 each am x 2 days,  1 each am x 2 days and stop    08/07/2013 f/u ov/[name] re AB/ UC flare on prednisone taper  Chief Complaint  Patient presents with  . Asthma    Breathing is slightly improved. Cough has improved. Mucus production of green sputum.   most of the mucus is first thing in am before even sitting up but doesn't typically wake him prematurely and no need for rescue rx on both bud/perf/symbicort albeit on high dose streroids per Dr Danise Edge for UC >>CT sinus   09/18/2013 Follow up  6 week follow up Asthma - reports breathing is some improved since last ov.  is now on 5 additional weeks of  prednisone for Colitis flare. Patient returns for a 6 week followup. Patient was seen last visit with persistent cough. Patient underwent a CT of the sinuses that showed acute sinusitis. He was treated with a 21 day course of Augmentin. Patient reports that his sinuses are much improved. His cough and congestion have improved greatly. Feels that his breathing is back to baseline. Patient is now on Symbicort. Has stopped his nebulizers. Do to s DME, change. Bases also been having difficulty with a, ulcerative colitis flare is now on a prednisone taper. He has been followed by his gastroenterologist for this.  Current Medications, Allergies, Complete Past Medical History, Past Surgical History, Family History, and Social History were reviewed in [employer] record.  ROS  The following are not active complaints unless bolded sore throat, dysphagia, dental problems, itching, sneezing,  nasal congestion or excess/ purulent secretions, ear ache,   fever, chills, sweats, unintended wt loss, pleuritic or exertional cp, hemoptysis,  orthopnea pnd or leg swelling, presyncope, palpitations, heartburn, abdominal pain, anorexia, nausea, vomiting, diarrhea  or change in bowel or urinary habits, s, visual complaints, headache, numbness weakness or ataxia or problems with walking or coordination,  change in mood/affect or memory.            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          Objective:   Physical Exam GEN: A/Ox3; pleasant , NAD, obese  Chronic abn gait  Wt  233 06/06/11  >> 233 09/18/2013   HEENT:  [place]/AT,  EACs-bilateral wax impaction  NOSE-clear drainage,  Non tender sinus  THROAT-clear, no lesions, no postnasal drip or exudate noted. Class 3 airway  NECK:  Supple w/ fair ROM; no JVD; normal carotid impulses w/o bruits; no  thyromegaly or nodules palpated; no lymphadenopathy.  RESP  Clear to A and P bilaterally   CARD:  RRR, no m/r/g  , no peripheral edema, pulses intact, no cyanosis or clubbing.  GI:   Soft & nt; nml bowel sounds; no organomegaly or masses detected.  Musco: Warm bil, walks with cane        sinus ct 08/07/2013  Short air-fluid levels in the maxillary sinuses bilaterally suggesting early acute sinusitis.  Assessment & Plan:

## 2013-09-18 NOTE — Patient Instructions (Addendum)
Try Spacer with Symbicort 2 puffs Twice daily  , brush/rinse and gargle after use.  Flu shot today  Follow up Dr. Sherene Sires  In 3 months and As needed

## 2013-09-18 NOTE — Assessment & Plan Note (Signed)
Compensated on present regimen.   

## 2013-09-18 NOTE — Assessment & Plan Note (Addendum)
Recent sinusitis confirmed on CT sinus >improved.    Plan  Try Spacer with Symbicort 2 puffs Twice daily  , brush/rinse and gargle after use.  Flu shot today  Follow up Dr. Sherene Sires  In 3 months and As needed

## 2013-09-25 DIAGNOSIS — H409 Unspecified glaucoma: Secondary | ICD-10-CM | POA: Diagnosis not present

## 2013-09-25 DIAGNOSIS — H402290 Chronic angle-closure glaucoma, unspecified eye, stage unspecified: Secondary | ICD-10-CM | POA: Diagnosis not present

## 2013-09-25 DIAGNOSIS — H04129 Dry eye syndrome of unspecified lacrimal gland: Secondary | ICD-10-CM | POA: Diagnosis not present

## 2013-09-29 ENCOUNTER — Ambulatory Visit (INDEPENDENT_AMBULATORY_CARE_PROVIDER_SITE_OTHER): Payer: Medicare Other | Admitting: Ophthalmology

## 2013-09-29 DIAGNOSIS — H35349 Macular cyst, hole, or pseudohole, unspecified eye: Secondary | ICD-10-CM | POA: Diagnosis not present

## 2013-09-29 DIAGNOSIS — H35379 Puckering of macula, unspecified eye: Secondary | ICD-10-CM | POA: Diagnosis not present

## 2013-09-29 DIAGNOSIS — H2510 Age-related nuclear cataract, unspecified eye: Secondary | ICD-10-CM

## 2013-09-29 DIAGNOSIS — H35039 Hypertensive retinopathy, unspecified eye: Secondary | ICD-10-CM

## 2013-09-29 DIAGNOSIS — I10 Essential (primary) hypertension: Secondary | ICD-10-CM

## 2013-09-29 DIAGNOSIS — H43819 Vitreous degeneration, unspecified eye: Secondary | ICD-10-CM

## 2013-10-21 DIAGNOSIS — M47812 Spondylosis without myelopathy or radiculopathy, cervical region: Secondary | ICD-10-CM | POA: Diagnosis not present

## 2013-10-21 DIAGNOSIS — G894 Chronic pain syndrome: Secondary | ICD-10-CM | POA: Diagnosis not present

## 2013-10-21 DIAGNOSIS — M47817 Spondylosis without myelopathy or radiculopathy, lumbosacral region: Secondary | ICD-10-CM | POA: Diagnosis not present

## 2013-10-21 DIAGNOSIS — M1710 Unilateral primary osteoarthritis, unspecified knee: Secondary | ICD-10-CM | POA: Diagnosis not present

## 2013-10-30 DIAGNOSIS — M47812 Spondylosis without myelopathy or radiculopathy, cervical region: Secondary | ICD-10-CM | POA: Diagnosis not present

## 2013-10-30 DIAGNOSIS — G894 Chronic pain syndrome: Secondary | ICD-10-CM | POA: Diagnosis not present

## 2013-11-05 ENCOUNTER — Encounter: Payer: Self-pay | Admitting: Internal Medicine

## 2013-11-05 ENCOUNTER — Ambulatory Visit (INDEPENDENT_AMBULATORY_CARE_PROVIDER_SITE_OTHER): Payer: Medicare Other | Admitting: Internal Medicine

## 2013-11-05 VITALS — BP 114/60 | HR 93 | Temp 98.2°F | Ht 65.0 in | Wt 226.0 lb

## 2013-11-05 DIAGNOSIS — K5190 Ulcerative colitis, unspecified, without complications: Secondary | ICD-10-CM | POA: Diagnosis not present

## 2013-11-05 DIAGNOSIS — J45909 Unspecified asthma, uncomplicated: Secondary | ICD-10-CM

## 2013-11-05 MED ORDER — PREDNISONE (PAK) 10 MG PO TABS: ORAL_TABLET | ORAL | Status: DC

## 2013-11-05 MED ORDER — AZITHROMYCIN 250 MG PO TABS: ORAL_TABLET | ORAL | Status: DC

## 2013-11-05 NOTE — Progress Notes (Signed)
Subjective:    Patient ID: Barry [name], male    DOB: 12-03-1946 .   MRN: [id number]  Brief patient profile:  67 yowm never smoker with relatively mild asthma but superimposed on obesity plus a chronically paralyzed left hemidiaphragm and a tendency to upper airway cough syndrome.   03/24/12 [name] ov for sinusitis > rx levaquin > sinus CT > Negative paranasal sinuses.  09/23/2012 Acute OV  Returns for persistent cough and congestion Complains of sinus  congestion, PND, throat clearing, small amounts of green mucus, increased SOB, wheezing x2.5weeks - finished augmentin 2 days and pred taper 3 days ago.  Does feel some better but cough is not gone and still has green mucus.  No fever or edema. No hemoptysis.  No n/v/d. Has wheezing on/off  rec Levaquin 750 mg one daily x 5 days Prednisone taper over next week.   Hydromet 1-2 tsp every 6-8 hr As needed   Fluids and rest .    12/26/2012 f/u ov/Barry [name] cc Productive cough with green phlegm, increased SOB, chest tightness, and wheezing x 1 week. Prior to that doing well, using med calendar and rare prns including laba. rec Levaquin 750 x 5 days  Prednisone 10 mg take  4 each am x 2 days,   2 each am x 2 days,  1 each am x2days and stop   03/04/2013 f/u ov/Barry [name] cc sick x 5 days coughs so hard it hurts midline to back, = to both flanks, no fever but mild chills, usuing prns from med calendar well, doe but not at rest rec Levaquin 750 x 5 days  Prednisone 10 mg take  4 each am x 2 days,   2 each am x 2 days,  1 each am x2days and stop    03/25/2013  Acute ov/Barry [name] re worse sob Chief Complaint  Patient presents with  . Acute Visit    Pt. reports x4 days ago with a sore throat.  Pt. has some irritation when he coughs being persistent.  Pt. has had some tightness in his chest , sob and some wheezing.   mucus is mostly white. Increased proair to tid, did not know max dose though clealry listed on med calendar under action plan.  >>pred pack    05/07/2013 Follow up and Med calendar  Returns for follow up and med calendar  We reviewed all his meds and organized them into a med calendar with pt education.  Complains of prod cough with green mucus, wheezing, increased SOB, chest tightness w/ coughing x1 week, denies f/c/s. Had flare 2 months ago, tx w/ steroid taper- improved until last 1 week.  Complains ears are stopped up rec Omnicef 300mg  Twice daily  For 10 days  Prednisone taper over next week  Follow med calendar closely and bring to each visit.  Debrox ear wax drops as needed.   07/13/2013 acute ov/Barry [name] acute ill x 4 days Chief Complaint  Patient presents with  . Acute Visit    Pt c/o increased cough x 4 days- prod with minimal thick, clear sputum. He also c/o increasing dyspnea since cough has worsened.   mucus does have some green discoloration, using max mucinex dm and proair up to every 6 with improvement doe but not back to baseline. rec Take mucinex dm 1200 mg  every 12 hours and supplement if needed with  tramadol 50 mg up to 2 every 4 hours to suppress the urge to cough.   Once you have eliminated the  cough for 3 straight days try reducing the tramadol first,  then the mucinex dm Augmentin x 10 days and call libby at [phone] if not satisfied to schedule a sinus CT Prednisone 10 mg take  4 each am x 2 days,   2 each am x 2 days,  1 each am x 2 days and stop    08/07/2013 f/u ov/Barry [name] re AB/ UC flare on prednisone taper  Chief Complaint  Patient presents with  . Asthma    Breathing is slightly improved. Cough has improved. Mucus production of green sputum.   most of the mucus is first thing in am before even sitting up but doesn't typically wake him prematurely and no need for rescue rx on both bud/perf/symbicort albeit on high dose streroids per Dr Danise Edge for UC >>CT sinus   09/18/2013 Follow up  6 week follow up Asthma - reports breathing is some improved since last ov.  is now on 5 additional weeks of  prednisone for Colitis flare. Patient returns for a 6 week followup. Patient was seen last visit with persistent cough. Patient underwent a CT of the sinuses that showed acute sinusitis. He was treated with a 21 day course of Augmentin. Patient reports that his sinuses are much improved. His cough and congestion have improved greatly. Feels that his breathing is back to baseline. Patient is now on Symbicort. Has stopped his nebulizers. Do to s DME, change. Bases also been having difficulty with a, ulcerative colitis flare is now on a prednisone taper. He has been followed by his gastroenterologist for this. rec Use spacer with symbicort   11/05/2013 acute  ov/Barry [name] re: uri/ asthma exac Chief Complaint  Patient presents with  . Acute Visit    Pt c/o aches, chills, diarrhea and wheezing- onset 5 days ago.   mild sore throat, slt discolored sputum, sob better p saba Had been doing better wit min use of saba prior to acute onset of symptoms    No obvious day to day or daytime variabilty or assoc   cp or chest tightness,   overt sinus or hb symptoms. No unusual exp hx or h/o childhood pna/ asthma or knowledge of premature birth.  Sleeping ok without nocturnal  or early am exacerbation  of respiratory  c/o's or need for noct saba. Also denies any obvious fluctuation of symptoms with weather or environmental changes or other aggravating or alleviating factors except as outlined above   Current Medications, Allergies, Complete Past Medical History, Past Surgical History, Family History, and Social History were reviewed in [employer] record.  ROS  The following are not active complaints unless bolded sore throat, dysphagia, dental problems, itching, sneezing,  nasal congestion or excess/ purulent secretions, ear ache,   fever, chills, sweats, unintended wt loss, pleuritic or exertional cp, hemoptysis,  orthopnea pnd or leg swelling, presyncope, palpitations, heartburn, abdominal  pain, anorexia, nausea, vomiting, diarrhea  or change in bowel9diarrhea) or urinary habits, change in stools or urine, dysuria,hematuria,  rash, arthralgias, visual complaints, headache, numbness weakness or ataxia or problems with walking or coordination,  change in mood/affect or memory.          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          Objective:   Physical Exam GEN: A/Ox3; pleasant , NAD, obese  Chronic abn gait/ hoarse   Wt  233 06/06/11  >> 233 09/18/2013 > 11/05/2013 226   HEENT:  [place]/AT,  EACs-bilateral wax impaction  NOSE-clear drainage,  Non tender sinus  THROAT-clear, no lesions, no postnasal drip or exudate noted. Class 3 airway  NECK:  Supple w/ fair ROM; no JVD; normal carotid impulses w/o bruits; no thyromegaly or nodules palpated; no lymphadenopathy.  RESP  Trace end exp wheeze bilaterally  CARD:  RRR, no m/r/g  , no peripheral edema, pulses intact, no cyanosis or clubbing.  GI:   Soft & nt; nml bowel sounds; no organomegaly or masses detected.  Musco: Warm bil, walks with cane        sinus ct 08/07/2013  Short air-fluid levels in the maxillary sinuses bilaterally suggesting early acute  sinusitis.  Assessment & Plan:

## 2013-11-05 NOTE — Patient Instructions (Signed)
zpak Prednisone 10 mg take  4 each am x 2 days,   2 each am x 2 days,  1 each am x 2 days and stop   Follow up with Dr Laural Benes   See calendar for specific medication instructions and bring it back for each and every office visit for every healthcare provider you see.  Without it,  you may not receive the best quality medical care that we feel you deserve.  You will note that the calendar groups together  your maintenance  medications that are timed at particular times of the day.  Think of this as your checklist for what your doctor has instructed you to do until your next evaluation to see what benefit  there is  to staying on a consistent group of medications intended to keep you well.  The other group at the bottom is entirely up to you to use as you see fit  for specific symptoms that may arise between visits that require you to treat them on an as needed basis.  Think of this as your action plan or "what if" list.   Separating the top medications from the bottom group is fundamental to providing you adequate care going forward.    See [name] NP in 2 months  with all your medications, even over the counter meds, separated in two separate bags, the ones you take no matter what vs the ones you stop once you feel better and take only as needed when you feel you need them.   [name]  will generate for you a new user friendly medication calendar that will put [place] all on the same page re: your medication use.     Without this process, it simply isn't possible to assure that we are providing  your outpatient care  with  the attention to detail we feel you deserve.   If we cannot assure that you're getting that kind of care,  then we cannot manage your problem effectively from this clinic.  Once you have seen [name] and we are sure that we're all on the same page with your medication use she will arrange follow up with me.

## 2013-11-08 NOTE — Assessment & Plan Note (Addendum)
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 rec zpak/ prednisone x 6 d    Each maintenance medication was reviewed in detail including most importantly the difference between maintenance and as needed and under what circumstances the prns are to be used. This was done in the context of a medication calendar review which provided the patient with a user-friendly unambiguous mechanism for medication administration and reconciliation and provides an action plan for all active problems. It is critical that this be shown to every doctor  for modification during the office visit if necessary so the patient can use it as a working document.

## 2013-11-08 NOTE — Assessment & Plan Note (Signed)
Not clear whether diarrhea viral or related to UC > f/u with Dr Josefa Half planned

## 2013-11-09 DIAGNOSIS — K921 Melena: Secondary | ICD-10-CM | POA: Diagnosis not present

## 2013-11-11 ENCOUNTER — Emergency Department (HOSPITAL_COMMUNITY): Payer: Medicare Other

## 2013-11-11 ENCOUNTER — Encounter (HOSPITAL_COMMUNITY): Payer: Self-pay | Admitting: Emergency Medicine

## 2013-11-11 ENCOUNTER — Emergency Department (HOSPITAL_COMMUNITY)
Admission: EM | Admit: 2013-11-11 | Discharge: 2013-11-12 | Disposition: A | Discharge status: Home / Self Care | Payer: Medicare Other | Attending: Emergency Medicine | Admitting: Emergency Medicine

## 2013-11-11 DIAGNOSIS — IMO0002 Reserved for concepts with insufficient information to code with codable children: Secondary | ICD-10-CM | POA: Diagnosis not present

## 2013-11-11 DIAGNOSIS — I10 Essential (primary) hypertension: Secondary | ICD-10-CM | POA: Insufficient documentation

## 2013-11-11 DIAGNOSIS — J45909 Unspecified asthma, uncomplicated: Secondary | ICD-10-CM | POA: Insufficient documentation

## 2013-11-11 DIAGNOSIS — E669 Obesity, unspecified: Secondary | ICD-10-CM | POA: Diagnosis not present

## 2013-11-11 DIAGNOSIS — K219 Gastro-esophageal reflux disease without esophagitis: Secondary | ICD-10-CM | POA: Diagnosis not present

## 2013-11-11 DIAGNOSIS — Z792 Long term (current) use of antibiotics: Secondary | ICD-10-CM | POA: Diagnosis not present

## 2013-11-11 DIAGNOSIS — R197 Diarrhea, unspecified: Secondary | ICD-10-CM | POA: Insufficient documentation

## 2013-11-11 DIAGNOSIS — R109 Unspecified abdominal pain: Secondary | ICD-10-CM | POA: Diagnosis not present

## 2013-11-11 DIAGNOSIS — K5190 Ulcerative colitis, unspecified, without complications: Secondary | ICD-10-CM | POA: Insufficient documentation

## 2013-11-11 DIAGNOSIS — I2510 Atherosclerotic heart disease of native coronary artery without angina pectoris: Secondary | ICD-10-CM | POA: Insufficient documentation

## 2013-11-11 DIAGNOSIS — Z79899 Other long term (current) drug therapy: Secondary | ICD-10-CM | POA: Insufficient documentation

## 2013-11-11 LAB — CBC WITH DIFFERENTIAL/PLATELET
Basophils Absolute: 0 10*3/uL (ref 0.0–0.1)
Basophils Relative: 0 % (ref 0–1)
HCT: 37 % — ABNORMAL LOW (ref 39.0–52.0)
Lymphocytes Relative: 20 % (ref 12–46)
Lymphs Abs: 1.8 10*3/uL (ref 0.7–4.0)
MCH: 29.6 pg (ref 26.0–34.0)
MCV: 89.8 fL (ref 78.0–100.0)
Monocytes Absolute: 1.4 10*3/uL — ABNORMAL HIGH (ref 0.1–1.0)
Neutro Abs: 5.6 10*3/uL (ref 1.7–7.7)
Platelets: 243 10*3/uL (ref 150–400)
RDW: 15.7 % — ABNORMAL HIGH (ref 11.5–15.5)

## 2013-11-11 MED ORDER — SODIUM CHLORIDE 0.9 % IV SOLN
1000.0000 mL | INTRAVENOUS | Status: DC
  Administered 2013-11-11: 1000 mL via INTRAVENOUS

## 2013-11-11 MED ORDER — ONDANSETRON HCL 4 MG/2ML IJ SOLN
4.0000 mg | Freq: Once | INTRAMUSCULAR | Status: AC
  Administered 2013-11-11: 4 mg via INTRAVENOUS

## 2013-11-11 NOTE — ED Provider Notes (Signed)
CSN: [id number]     Arrival date & time 11/11/13  2154 History   First MD Initiated Contact with Patient 11/11/13 2236     Chief Complaint  Patient presents with  . Diarrhea   (Consider location/radiation/quality/duration/timing/severity/associated sxs/prior Treatment) The history is provided by the patient and medical records. No language interpreter was used.    Barry [name] is a 67 y.o. male  with a hx of uc, HTN, CAD, asthma presents to the Emergency Department complaining of gradual, persistent, progressively worsening watery and intermittently bloody diarrhea onset 8 days ago.  Patient reports intermittent, vague lower abd pain associated with the diarrhea, but is not currently experiencing pain. He reports long-standing history of ulcerative colitis for which he has been treated on and off with prednisone. He recently had to stop a five-week course of steroids to have a facet injection in his neck.  He reports when he stopped the steroids his diarrhea increased.  He reports he has not tried any over-the-counter medications including Imodium for the diarrhea. He states his primary concern is having to wait until Monday to discuss her neurologist. He reports he does have a gastroenterology appointment on Monday.  Nothing seems to make the symptoms better or worse. He denies fever, chills, headache, neck pain, chest pain, shortness of breath, nausea, vomiting, weakness, dizziness, syncope, dysuria.   Past Medical History  Diagnosis Date  . Hypertension   . Coronary heart disease   . Ulcerative colitis   . Chronic cough   . Obesity   . Asthma     PFTs 06/15/05 FEV1 85% predicted ratio 68% and truncation of resp loop in a sawtooth pattern. HFA 25% 08-01-2009 >50% Nov 01, 2009>50% 01-31-2010  . 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 No pertinent past surgical history. Family History  Problem Relation Age of Onset  . Heart disease Father   . Hypertension  Mother    History  Substance Use Topics  . Smoking status: Never Smoker   . Smokeless tobacco: Never Used  . Alcohol Use: No    Review of Systems  Constitutional: Negative for fever, diaphoresis, appetite change, fatigue and unexpected weight change.  HENT: Negative for mouth sores and trouble swallowing.   Respiratory: Negative for cough, chest tightness, shortness of breath, wheezing and stridor.   Cardiovascular: Negative for chest pain and palpitations.  Gastrointestinal: Positive for diarrhea. Negative for nausea, vomiting, abdominal pain, constipation, blood in stool, abdominal distention and rectal pain.  Genitourinary: Negative for dysuria, urgency, frequency, hematuria, flank pain and difficulty urinating.  Musculoskeletal: Negative for back pain, neck pain and neck stiffness.  Skin: Negative for rash.  Neurological: Negative for weakness.  Hematological: Negative for adenopathy.  Psychiatric/Behavioral: Negative for confusion.  All other systems reviewed and are negative.    Allergies  Piroxicam  Home Medications   Current Outpatient Rx  Name  Route  Sig  Dispense  Refill  . acetaminophen (TYLENOL) 500 MG tablet   Oral   Take 500 mg by mouth every 8 (eight) hours as needed for mild pain. Per bottle as needed for pain         . albuterol (PROAIR HFA) 108 (90 BASE) MCG/ACT inhaler   Inhalation   Inhale 2 puffs into the lungs every 6 (six) hours as needed for wheezing or shortness of breath.          [name] atorvastatin (LIPITOR) 40 MG tablet   Oral   Take  40 mg by mouth daily.           [name] azithromycin (ZITHROMAX) 250 MG tablet      Take 2 on day one then 1 daily x 4 days   6 tablet   0   . Dextromethorphan-Guaifenesin (MUCINEX DM MAXIMUM STRENGTH) 60-1200 MG per 12 hr tablet   Oral   Take 1 tablet by mouth every 12 (twelve) hours as needed (for cough or thick mucus).          [name] digoxin (LANOXIN) 0.125 MG tablet   Oral   Take 125 mcg by mouth daily.            [name] diltiazem (DILACOR XR) 180 MG 24 hr capsule   Oral   Take 180 mg by mouth daily.           [name] ezetimibe (ZETIA) 10 MG tablet   Oral   Take 10 mg by mouth daily.           . famotidine (PEPCID) 20 MG tablet   Oral   Take 20 mg by mouth at bedtime.           . folic acid (FOLVITE) 1 MG tablet   Oral   Take 1 mg by mouth daily.           . Hypertonic Nasal Wash (SINUS RINSE NA)   Nasal   Place 1 packet into the nose as needed (sinus).          [name] latanoprost (XALATAN) 0.005 % ophthalmic solution   Both Eyes   Place 1 drop into both eyes at bedtime.         . metoprolol tartrate (LOPRESSOR) 25 MG tablet      Take 1/2 tablet in AM and 1/2 tablet in PM         . Multiple Vitamins-Minerals (CENTRUM SILVER PO)   Oral   Take 1 tablet by mouth daily.         [name] omeprazole (PRILOSEC) 20 MG capsule   Oral   Take 20 mg by mouth daily.           Bertram Gala Glycol-Propyl Glycol (SYSTANE) 0.4-0.3 % SOLN   Both Eyes   Place 1 drop into both eyes 2 (two) times daily.          . predniSONE (DELTASONE) 20 MG tablet   Oral   Take 20 mg by mouth daily. Take 2 tablets every day for 2 days, then 1 tablet every day for 3 days         . PRESCRIPTION MEDICATION   Intramuscular   Inject 1 Syringe into the muscle once. Got a steroid shot from Dr. Vear Clock about 10 days ago.         . Pseudoephedrine-Ibuprofen (ADVIL COLD/SINUS) 30-200 MG TABS   Oral   Take 1 tablet by mouth as needed (sinus).          [name] Spacer/Aero-Holding Chambers (AEROCHAMBER MV) inhaler      Use as instructed   1 each   0   . sulfaSALAzine (AZULFIDINE) 500 MG tablet   Oral   Take 1,000 mg by mouth 4 (four) times daily.          . SYMBICORT 160-4.5 MCG/ACT inhaler   Inhalation   Inhale 2 puffs into the lungs 2 (two) times daily.         . predniSONE (STERAPRED UNI-PAK) 10 MG tablet      Prednisone  10 mg take  4 each am x 2 days,   2 each am x 2 days,  1 each am  x2days and stop   14 tablet   0    BP 132/76  Pulse 99  Temp(Src) 98.4 F (36.9 C) (Oral)  Resp 18  SpO2 94% Physical Exam  Nursing note and vitals reviewed. Constitutional: He is oriented to person, place, and time. He appears well-developed and well-nourished. No distress.  Awake, alert, nontoxic appearance  HENT:  Head: Normocephalic and atraumatic.  Mouth/Throat: Oropharynx is clear and moist. Mucous membranes are dry. No uvula swelling. No oropharyngeal exudate, posterior oropharyngeal edema, posterior oropharyngeal erythema or tonsillar abscesses.  Dry mucous membranes  Eyes: Conjunctivae are normal. Pupils are equal, round, and reactive to light. No scleral icterus.  Neck: Normal range of motion. Neck supple.  Cardiovascular: Normal rate, regular rhythm, normal heart sounds and intact distal pulses.   No murmur heard. Pulmonary/Chest: Effort normal and breath sounds normal. No respiratory distress. He has no wheezes. He has no rales.  Abdominal: Soft. Bowel sounds are normal. He exhibits no distension and no mass. There is no tenderness. There is no rigidity, no rebound and no guarding.  Obese abdomen Soft and nontender  Musculoskeletal: Normal range of motion. He exhibits no edema.  Lymphadenopathy:    He has no cervical adenopathy.  Neurological: He is alert and oriented to person, place, and time. He exhibits normal muscle tone. Coordination normal.  Speech is clear and goal oriented Moves extremities without ataxia  Skin: Skin is warm and dry. He is not diaphoretic. No erythema.  Psychiatric: He has a normal mood and affect.    ED Course  Procedures (including critical care time) Labs Review Labs Reviewed  CBC WITH DIFFERENTIAL - Abnormal; Notable for the following:    RBC 4.12 (*)    Hemoglobin 12.2 (*)    HCT 37.0 (*)    RDW 15.7 (*)    Monocytes Relative 16 (*)    Monocytes Absolute 1.4 (*)    All other components within normal limits  COMPREHENSIVE  METABOLIC PANEL - Abnormal; Notable for the following:    BUN 26 (*)    Albumin 3.4 (*)    GFR calc non Af Amer 86 (*)    All other components within normal limits  LIPASE, BLOOD - Abnormal; Notable for the following:    Lipase 113 (*)    All other components within normal limits  URINALYSIS, ROUTINE W REFLEX MICROSCOPIC - Abnormal; Notable for the following:    Specific Gravity, Urine 1.033 (*)    Leukocytes, UA SMALL (*)    All other components within normal limits  URINE MICROSCOPIC-ADD ON - Abnormal; Notable for the following:    Crystals CA OXALATE CRYSTALS (*)    All other components within normal limits  CLOSTRIDIUM DIFFICILE BY PCR  OCCULT BLOOD, POC DEVICE   Imaging Review Dg Abd Acute W/chest  11/11/2013   CLINICAL DATA:  Abdominal pain and diarrhea. Coronary artery disease.  EXAM: ACUTE ABDOMEN SERIES (ABDOMEN 2 VIEW & CHEST 1 VIEW)  COMPARISON:  Chest radiograph on 09/23/2012  FINDINGS: There is no evidence of dilated bowel loops or free intraperitoneal air. Scattered air-fluid levels are noted. No radiopaque calculi identified. Pelvic phleboliths noted. Surgical clips seen in left upper quadrant.  Elevation of left hemidiaphragm is stable. Low lung volumes are seen, however both lungs are clear. Heart size is stable and within normal limits. Prior CABG again noted.  IMPRESSION: Nonspecific, nonobstructive bowel gas pattern.  Stable elevation of left hemidiaphragm.  No active lung disease.   Electronically Signed   By: Myles Rosenthal M.D.   On: 11/11/2013 23:53    EKG Interpretation   None       MDM   1. Diarrhea   2. GASTROESOPHAGEAL REFLUX DISEASE   3. ULCERATIVE COLITIS      IVAAN LIDDY presents with nonfocal, vague abdominal pain with persistent diarrhea. Patient alert, oriented, nontoxic, nonseptic appearing. Dry mucous membranes but no pallor of the conjunctiva.  12:00AM CBC without leukocytosis and mild anemia at 12.2 CMP March unremarkable and lipase 113.  Urine and fecal occult pending. Acute abdomen without evidence of bowel obstruction.  1:15 AM P. abdominal exam shows abdomen is soft and nontender. Urinalysis 11-20 white blood cells but no clinical signs of dysuria. Will culture. Fecal occult negative. C-diff also pending.  Patient vitals signs have remained stable. He is not tachycardic and wishes to be discharged home. Patient pain control. Percocet. We'll discharge home with same and recommended Imodium. Patient given fluids 2 L and states he feels much better.    Patient with elevated lipase, possibly due to pancreatitis however patient without epigastric pain or nausea/vomiting. Patient is tolerating by mouth here in the department without difficulty.  Patient is nontoxic, nonseptic appearing, in no apparent distress.  Patient's pain and other symptoms adequately managed in emergency department.  Fluid bolus given.  Labs, imaging and vitals reviewed.  Patient does not meet the SIRS or Sepsis criteria.  On repeat exam patient does not have a surgical abdomin and there are no peritoneal signs.  No indication of appendicitis, bowel obstruction, bowel perforation, cholecystitis, diverticulitis.  Patient discharged home with symptomatic treatment and given strict instructions for follow-up with GI on this coming Monday at his already scheduled appointment.    It has been determined that no acute conditions requiring further emergency intervention are present at this time. The patient/guardian have been advised of the diagnosis and plan. We have discussed signs and symptoms that warrant return to the ED, such as changes or worsening in symptoms.   Vital signs are stable at discharge.   BP 132/76  Pulse 99  Temp(Src) 98.4 F (36.9 C) (Oral)  Resp 18  SpO2 94%  Patient/guardian has voiced understanding and agreed to follow-up with the PCP or specialist.      Dierdre Forth, PA-C 11/12/13 0142  Dierdre Forth, PA-C 11/12/13  1610

## 2013-11-11 NOTE — ED Notes (Signed)
Pt has hx of colitis.  Recently placed on antibiotic and prednisone.  Pt has had diarrhea x 8 days.  Pt having difficulty eating and minimal fluids.  Pt said he had blood work on Monday and was told counts were low.

## 2013-11-12 LAB — COMPREHENSIVE METABOLIC PANEL
ALT: 19 U/L (ref 0–53)
AST: 21 U/L (ref 0–37)
Albumin: 3.4 g/dL — ABNORMAL LOW (ref 3.5–5.2)
Alkaline Phosphatase: 56 U/L (ref 39–117)
BUN: 26 mg/dL — ABNORMAL HIGH (ref 6–23)
CO2: 24 mEq/L (ref 19–32)
Calcium: 9.2 mg/dL (ref 8.4–10.5)
Chloride: 102 mEq/L (ref 96–112)
Creatinine, Ser: 0.88 mg/dL (ref 0.50–1.35)
GFR calc non Af Amer: 86 mL/min — ABNORMAL LOW (ref >90)
Glucose, Bld: 91 mg/dL (ref 70–99)
Sodium: 137 mEq/L (ref 135–145)
Total Bilirubin: 0.4 mg/dL (ref 0.3–1.2)

## 2013-11-12 LAB — URINALYSIS, ROUTINE W REFLEX MICROSCOPIC
Bilirubin Urine: NEGATIVE
Glucose, UA: NEGATIVE mg/dL
Ketones, ur: NEGATIVE mg/dL
Nitrite: NEGATIVE
Protein, ur: NEGATIVE mg/dL

## 2013-11-12 LAB — URINE MICROSCOPIC-ADD ON

## 2013-11-12 LAB — LIPASE, BLOOD: Lipase: 113 U/L — ABNORMAL HIGH (ref 11–59)

## 2013-11-12 MED ORDER — OXYCODONE-ACETAMINOPHEN 5-325 MG PO TABS: 1.0000 | ORAL_TABLET | ORAL | Status: DC | PRN

## 2013-11-12 MED ORDER — OXYCODONE-ACETAMINOPHEN 5-325 MG PO TABS
2.0000 | ORAL_TABLET | Freq: Once | ORAL | Status: AC
  Administered 2013-11-12: 2 via ORAL
  Filled 2013-11-12: qty 2

## 2013-11-12 MED ORDER — LOPERAMIDE HCL 2 MG PO CAPS
2.0000 mg | ORAL_CAPSULE | Freq: Once | ORAL | Status: AC
  Administered 2013-11-12: 2 mg via ORAL
  Filled 2013-11-12: qty 1

## 2013-11-12 MED ORDER — SODIUM CHLORIDE 0.9 % IV BOLUS (SEPSIS)
1000.0000 mL | Freq: Once | INTRAVENOUS | Status: AC
  Administered 2013-11-12: 1000 mL via INTRAVENOUS

## 2013-11-12 NOTE — ED Provider Notes (Signed)
Medical screening examination/treatment/procedure(s) were performed by non-physician practitioner and as supervising physician I was immediately available for consultation/collaboration.    Sunnie Nielsen, MD 11/12/13 [phone]

## 2013-11-13 LAB — URINE CULTURE: Colony Count: 30000

## 2013-11-16 DIAGNOSIS — K5100 Ulcerative (chronic) pancolitis without complications: Secondary | ICD-10-CM | POA: Diagnosis not present

## 2013-11-17 DIAGNOSIS — K5100 Ulcerative (chronic) pancolitis without complications: Secondary | ICD-10-CM | POA: Diagnosis not present

## 2013-12-02 DIAGNOSIS — M47812 Spondylosis without myelopathy or radiculopathy, cervical region: Secondary | ICD-10-CM | POA: Diagnosis not present

## 2013-12-02 DIAGNOSIS — G894 Chronic pain syndrome: Secondary | ICD-10-CM | POA: Diagnosis not present

## 2013-12-02 DIAGNOSIS — M47817 Spondylosis without myelopathy or radiculopathy, lumbosacral region: Secondary | ICD-10-CM | POA: Diagnosis not present

## 2013-12-02 DIAGNOSIS — M1710 Unilateral primary osteoarthritis, unspecified knee: Secondary | ICD-10-CM | POA: Diagnosis not present

## 2013-12-28 DIAGNOSIS — H409 Unspecified glaucoma: Secondary | ICD-10-CM | POA: Diagnosis not present

## 2013-12-28 DIAGNOSIS — H402290 Chronic angle-closure glaucoma, unspecified eye, stage unspecified: Secondary | ICD-10-CM | POA: Diagnosis not present

## 2014-01-05 ENCOUNTER — Ambulatory Visit (INDEPENDENT_AMBULATORY_CARE_PROVIDER_SITE_OTHER): Payer: Medicare Other | Admitting: Adult Health

## 2014-01-05 ENCOUNTER — Encounter: Payer: Self-pay | Admitting: Adult Health

## 2014-01-05 VITALS — BP 130/70 | HR 87 | Temp 98.1°F | Ht 63.0 in | Wt 231.2 lb

## 2014-01-05 DIAGNOSIS — Z23 Encounter for immunization: Secondary | ICD-10-CM | POA: Diagnosis not present

## 2014-01-05 DIAGNOSIS — J45909 Unspecified asthma, uncomplicated: Secondary | ICD-10-CM | POA: Diagnosis not present

## 2014-01-05 NOTE — Patient Instructions (Signed)
Prevnar 13 vaccine today.  Follow med calendar closely and bring to each visit.  Follow up Dr. Melvyn Novas  In 3 months and As needed

## 2014-01-06 NOTE — Assessment & Plan Note (Signed)
Compensated on present regimen  Patient's medications were reviewed today and patient education was given. Computerized medication calendar was adjusted/completed   Plan  Cont on current regimen  Prevnar 13

## 2014-01-06 NOTE — Progress Notes (Signed)
Subjective:    Patient ID: Barry [name], male    DOB: 12-03-1936 .   MRN: [id number]  Brief patient profile:  68 yowm never smoker with relatively mild asthma but superimposed on obesity plus a chronically paralyzed left hemidiaphragm and a tendency to upper airway cough syndrome.   03/24/12 [name] ov for sinusitis > rx levaquin > sinus CT > Negative paranasal sinuses.  09/23/2012 Acute OV  Returns for persistent cough and congestion Complains of sinus  congestion, PND, throat clearing, small amounts of green mucus, increased SOB, wheezing x2.5weeks - finished augmentin 2 days and pred taper 3 days ago.  Does feel some better but cough is not gone and still has green mucus.  No fever or edema. No hemoptysis.  No n/v/d. Has wheezing on/off  rec Levaquin 750 mg one daily x 5 days Prednisone taper over next week.   Hydromet 1-2 tsp every 6-8 hr As needed   Fluids and rest .    12/26/2012 f/u ov/[name] cc Productive cough with green phlegm, increased SOB, chest tightness, and wheezing x 1 week. Prior to that doing well, using med calendar and rare prns including laba. rec Levaquin 750 x 5 days  Prednisone 10 mg take  4 each am x 2 days,   2 each am x 2 days,  1 each am x2days and stop   03/04/2013 f/u ov/[name] cc sick x 5 days coughs so hard it hurts midline to back, = to both flanks, no fever but mild chills, usuing prns from med calendar well, doe but not at rest rec Levaquin 750 x 5 days  Prednisone 10 mg take  4 each am x 2 days,   2 each am x 2 days,  1 each am x2days and stop    03/25/2013  Acute ov/[name] re worse sob Chief Complaint  Patient presents with  . Acute Visit    Pt. reports x4 days ago with a sore throat.  Pt. has some irritation when he coughs being persistent.  Pt. has had some tightness in his chest , sob and some wheezing.   mucus is mostly white. Increased proair to tid, did not know max dose though clealry listed on med calendar under action plan.  >>pred pack    05/07/2013 Follow up and Med calendar  Returns for follow up and med calendar  We reviewed all his meds and organized them into a med calendar with pt education.  Complains of prod cough with green mucus, wheezing, increased SOB, chest tightness w/ coughing x1 week, denies f/c/s. Had flare 2 months ago, tx w/ steroid taper- improved until last 1 week.  Complains ears are stopped up rec Omnicef 300mg  Twice daily  For 10 days  Prednisone taper over next week  Follow med calendar closely and bring to each visit.  Debrox ear wax drops as needed.   07/13/2013 acute ov/[name] acute ill x 4 days Chief Complaint  Patient presents with  . Acute Visit    Pt c/o increased cough x 4 days- prod with minimal thick, clear sputum. He also c/o increasing dyspnea since cough has worsened.   mucus does have some green discoloration, using max mucinex dm and proair up to every 6 with improvement doe but not back to baseline. rec Take mucinex dm 1200 mg  every 12 hours and supplement if needed with  tramadol 50 mg up to 2 every 4 hours to suppress the urge to cough.   Once you have eliminated the  cough for 3 straight days try reducing the tramadol first,  then the mucinex dm Augmentin x 10 days and call [facility] at [phone] if not satisfied to schedule a sinus CT Prednisone 10 mg take  4 each am x 2 days,   2 each am x 2 days,  1 each am x 2 days and stop    08/07/2013 f/u ov/[name] re AB/ UC flare on prednisone taper  Chief Complaint  Patient presents with  . Asthma    Breathing is slightly improved. Cough has improved. Mucus production of green sputum.   most of the mucus is first thing in am before even sitting up but doesn't typically wake him prematurely and no need for rescue rx on both bud/perf/symbicort albeit on high dose streroids per Dr Earle Gell for UC >>CT sinus   09/18/2013 Follow up  6 week follow up Asthma - reports breathing is some improved since last ov.  is now on 5 additional weeks of  prednisone for Colitis flare. Patient returns for a 6 week followup. Patient was seen last visit with persistent cough. Patient underwent a CT of the sinuses that showed acute sinusitis. He was treated with a 21 day course of Augmentin. Patient reports that his sinuses are much improved. His cough and congestion have improved greatly. Feels that his breathing is back to baseline. Patient is now on Symbicort. Has stopped his nebulizers. Do to s DME, change. Bases also been having difficulty with a, ulcerative colitis flare is now on a prednisone taper. He has been followed by his gastroenterologist for this. rec Use spacer with symbicort   11/05/2013 acute  ov/[name] re: uri/ asthma exac Chief Complaint  Patient presents with  . Acute Visit    Pt c/o aches, chills, diarrhea and wheezing- onset 5 days ago.   mild sore throat, slt discolored sputum, sob better p saba Had been doing better wit min use of saba prior to acute onset of symptoms  >>zpack and pred pack   01/05/14 Follow up and med review  Returns for follow up and med calendar  We reviewed all his meds and organized them into a med calendar with pt education.  Appears he is taking his meds correctly.  Had flare 2 months ago, tx w/ zpack and steroid taper-. Unable to take zpack d/t GI issues.  Cough and wheezing improved.  Had recent Colitis flare , on slow steroid taper.  No chest pain, orthopnea, edema , fever, dyspnea.     Current Medications, Allergies, Complete Past Medical History, Past Surgical History, Family History, and Social History were reviewed in [employer] record.  ROS  The following are not active complaints unless bolded , dysphagia, dental problems, itching, sneezing,  nasal congestion or excess/ purulent secretions, ear ache,   fever, chills, sweats, unintended wt loss, pleuritic or exertional cp, hemoptysis,  orthopnea pnd or leg swelling, presyncope, palpitations, heartburn, abdominal  pain, anorexia, nausea, vomiting, diarrhea  or change in bowel9diarrhea) or urinary habits, change in stools or urine, dysuria,hematuria,  rash, arthralgias, visual complaints, headache, numbness weakness or ataxia or problems with walking or coordination,  change in mood/affect or memory.          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          Objective:   Physical Exam GEN: A/Ox3; pleasant , NAD, obese  Chronic abn gait/ hoarse   Wt  233 06/06/11  >> 233 09/18/2013 > 11/05/2013 226 >231 01/06/2014   HEENT:  [place]/AT,  EACs-wax   NOSE-clear drainage,  Non tender sinus  THROAT-clear, no lesions, no postnasal drip or exudate noted. Class 3 airway  NECK:  Supple w/ fair ROM; no JVD; normal carotid impulses w/o bruits; no thyromegaly or nodules palpated; no lymphadenopathy.  RESP CTA w/ no wheezing   CARD:  RRR, no m/r/g  , no peripheral edema, pulses intact, no cyanosis or clubbing.  GI:   Soft & nt; nml bowel sounds; no organomegaly or masses detected.  Musco: Warm bil, walks with cane        sinus ct 08/07/2013  Short air-fluid levels in the maxillary sinuses bilaterally suggesting early acute  sinusitis.  Assessment & Plan:

## 2014-01-19 NOTE — Addendum Note (Signed)
Addended by: Parke Poisson E on: 01/19/2014 10:11 AM   Modules accepted: Orders, Medications

## 2014-02-23 DIAGNOSIS — H409 Unspecified glaucoma: Secondary | ICD-10-CM | POA: Diagnosis not present

## 2014-02-23 DIAGNOSIS — H402290 Chronic angle-closure glaucoma, unspecified eye, stage unspecified: Secondary | ICD-10-CM | POA: Diagnosis not present

## 2014-03-01 DIAGNOSIS — G894 Chronic pain syndrome: Secondary | ICD-10-CM | POA: Diagnosis not present

## 2014-03-01 DIAGNOSIS — M47812 Spondylosis without myelopathy or radiculopathy, cervical region: Secondary | ICD-10-CM | POA: Diagnosis not present

## 2014-03-01 DIAGNOSIS — M47817 Spondylosis without myelopathy or radiculopathy, lumbosacral region: Secondary | ICD-10-CM | POA: Diagnosis not present

## 2014-03-01 DIAGNOSIS — IMO0002 Reserved for concepts with insufficient information to code with codable children: Secondary | ICD-10-CM | POA: Diagnosis not present

## 2014-03-01 DIAGNOSIS — M1710 Unilateral primary osteoarthritis, unspecified knee: Secondary | ICD-10-CM | POA: Diagnosis not present

## 2014-03-08 ENCOUNTER — Ambulatory Visit (INDEPENDENT_AMBULATORY_CARE_PROVIDER_SITE_OTHER): Payer: Medicare Other | Admitting: Internal Medicine

## 2014-03-08 ENCOUNTER — Encounter: Payer: Self-pay | Admitting: Internal Medicine

## 2014-03-08 VITALS — BP 160/94 | HR 121 | Temp 98.1°F | Ht 63.0 in | Wt 238.0 lb

## 2014-03-08 DIAGNOSIS — J45909 Unspecified asthma, uncomplicated: Secondary | ICD-10-CM

## 2014-03-08 MED ORDER — TRAMADOL HCL 50 MG PO TABS: ORAL_TABLET | ORAL | Status: DC

## 2014-03-08 MED ORDER — AMOXICILLIN-POT CLAVULANATE 875-125 MG PO TABS: 1.0000 | ORAL_TABLET | Freq: Two times a day (BID) | ORAL | Status: DC

## 2014-03-08 MED ORDER — PREDNISONE 10 MG PO TABS: ORAL_TABLET | ORAL | Status: DC

## 2014-03-08 NOTE — Progress Notes (Signed)
Subjective:    Patient ID: Barry [name], male    DOB: February 13, 1946 .   MRN: [id number]   Brief patient profile:  68yowm never smoker with relatively mild asthma but superimposed on obesity plus a chronically paralyzed left hemidiaphragm and a tendency to upper airway cough syndrome.    History of Present Illness  03/24/12 [name] ov for sinusitis > rx levaquin > sinus CT > Negative paranasal sinuses.  09/23/2012 Acute OV  Returns for persistent cough and congestion Complains of sinus  congestion, PND, throat clearing, small amounts of green mucus, increased SOB, wheezing x2.5weeks - finished augmentin 2 days and pred taper 3 days ago.  Does feel some better but cough is not gone and still has green mucus.  No fever or edema. No hemoptysis.  No n/v/d. Has wheezing on/off  rec Levaquin 750 mg one daily x 5 days Prednisone taper over next week.   Hydromet 1-2 tsp every 6-8 hr As needed   Fluids and rest .       01/05/14 Follow up and med review  Returns for follow up and med calendar  We reviewed all his meds and organized them into a med calendar with pt education.  Appears he is taking his meds correctly.  Had flare 2 months ago, tx w/ zpack and steroid taper-. Unable to take zpack d/t GI issues.  Cough and wheezing improved.  Had recent Colitis flare , on slow steroid taper rec prevnar given     03/08/2014 f/u ov/Barry [name] re: flare x one week off tramadol  Chief Complaint  Patient presents with  . Acute Visit    Pt c/o increased SOB, wheezing, sinus pressure, and prod cough with lare amounts of green sputum x 4 days.   using rescue 3 -4 x since onset, none at all on day of ov   No obvious day to day or daytime variabilty or assoc   cp or chest tightness, subjective wheeze overt   hb symptoms. No unusual exp hx or h/o childhood pna/ asthma or knowledge of premature birth.  Sleeping ok without nocturnal  or early am exacerbation  of respiratory  c/o's or need for noct saba. Also  denies any obvious fluctuation of symptoms with weather or environmental changes or other aggravating or alleviating factors except as outlined above   Current Medications, Allergies, Complete Past Medical History, Past Surgical History, Family History, and Social History were reviewed in [facility] record.  ROS  The following are not active complaints unless bolded sore throat, dysphagia, dental problems, itching, sneezing,  nasal congestion or excess/ purulent secretions, ear ache,   fever, chills, sweats, unintended wt loss, pleuritic or exertional cp, hemoptysis,  orthopnea pnd or leg swelling, presyncope, palpitations, heartburn, abdominal pain, anorexia, nausea, vomiting, diarrhea  or change in bowel or urinary habits, change in stools or urine, dysuria,hematuria,  rash, arthralgias, visual complaints, headache, numbness weakness or ataxia or problems with walking or coordination,  change in mood/affect or memory.                      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          Objective:   Physical Exam GEN: A/Ox3; pleasant , NAD, obese  Chronic abn gait/ hoarse    Wt  233 06/06/11  >> 233 09/18/2013 > 11/05/2013 226 >231 01/06/2014 > 238 03/08/2014   HEENT:  [place]/AT,  EACs-wax   NOSE-clear drainage,  Non tender sinus  THROAT-clear, no lesions, no postnasal drip or exudate noted. Class 3 airway  NECK:  Supple w/ fair ROM; no JVD; normal carotid impulses w/o bruits; no thyromegaly or nodules palpated; no lymphadenopathy.  RESP min end exp wheeze bilaterally   CARD:  RRR, no m/r/g  , no peripheral edema, pulses intact, no cyanosis or clubbing.  GI:   Soft & nt; nml bowel sounds; no organomegaly or masses detected.  Musco: Warm bil, walks with cane        sinus ct 08/07/2013  Short air-fluid levels in the maxillary sinuses bilaterally suggesting early acute sinusitis.  Assessment & Plan:

## 2014-03-08 NOTE — Patient Instructions (Signed)
Augmentin 875 mg take one pill twice daily  X 10 days - take at breakfast and supper with large glass of water.  It would help reduce the usual side effects (diarrhea and yeast infections) if you ate cultured yogurt at lunch.   If not improving > Prednisone 10 mg take  4 each am x 2 days,   2 each am x 2 days,  1 each am x 2 days and stop   Each maintenance medication was reviewed in detail including most importantly the difference between maintenance and as needed and under what circumstances the prns are to be used. This was done in the context of a medication calendar review which provided the patient with a user-friendly unambiguous mechanism for medication administration and reconciliation and provides an action plan for all active problems. It is critical that this be shown to every doctor  for modification during the office visit if necessary so the patient can use it as a working document.

## 2014-03-09 NOTE — Assessment & Plan Note (Addendum)
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  - HFA 25% August 01, 2009 > 50% November 01, 2009 > 50% January 31, 2010 therefore changed to neb bud/brovana - HFA 75% p extensive coaching 08/07/2013  > insurance issues so try symbicort 160 2 bid instead of neb  DDX of  difficult airways managment all start with A and  include Adherence, Ace Inhibitors, Acid Reflux, Active Sinus Disease, Alpha 1 Antitripsin deficiency, Anxiety masquerading as Airways dz,  ABPA,  allergy(esp in young), Aspiration (esp in elderly), Adverse effects of DPI,  Active smokers, plus two Bs  = Bronchiectasis and Beta blocker use..and one C= CHF  Adherence is always the initial "prime suspect" and is a multilayered concern that requires a "trust but verify" approach in every patient - starting with knowing how to use medications, especially inhalers, correctly, keeping up with refills and understanding the fundamental difference between maintenance and prns vs those medications only taken for a very short course and then stopped and not refilled.  - Each maintenance medication was reviewed in detail including most importantly the difference between maintenance and as needed and under what circumstances the prns are to be used. This was done in the context of a medication calendar review which provided the patient with a user-friendly unambiguous mechanism for medication administration and reconciliation and provides an action plan for all active problems. It is critical that this be shown to every doctor  for modification during the office visit if necessary so the patient can use it as a working document.      ? Active sinus dz > augmentin x 10 d then low threshold to repeat sinus ct and refer to ent as this has become a recurrent theme  ? Acid (or non-acid) GERD > always difficult to exclude as up to 75% of pts in some series report no assoc GI/ Heartburn symptoms> rec  continue  max (24h)  acid suppression and diet restrictions/ reviewed and instructions given in writing.

## 2014-03-26 ENCOUNTER — Ambulatory Visit (INDEPENDENT_AMBULATORY_CARE_PROVIDER_SITE_OTHER): Payer: Medicare Other | Admitting: Internal Medicine

## 2014-03-26 ENCOUNTER — Encounter: Payer: Self-pay | Admitting: Internal Medicine

## 2014-03-26 VITALS — BP 132/80 | HR 90 | Temp 98.0°F | Ht 63.0 in | Wt 239.0 lb

## 2014-03-26 DIAGNOSIS — J310 Chronic rhinitis: Secondary | ICD-10-CM

## 2014-03-26 DIAGNOSIS — J45909 Unspecified asthma, uncomplicated: Secondary | ICD-10-CM

## 2014-03-26 MED ORDER — PREDNISONE 10 MG PO TABS: ORAL_TABLET | ORAL | Status: DC

## 2014-03-26 MED ORDER — TRAMADOL HCL 50 MG PO TABS: ORAL_TABLET | ORAL | Status: DC

## 2014-03-26 MED ORDER — AMOXICILLIN-POT CLAVULANATE 875-125 MG PO TABS: 1.0000 | ORAL_TABLET | Freq: Two times a day (BID) | ORAL | Status: DC

## 2014-03-26 NOTE — Patient Instructions (Addendum)
Augmentin 875 mg take one pill twice daily  X 21days - take at breakfast and supper with large glass of water.  It would help reduce the usual side effects (diarrhea and yeast infections) if you ate cultured yogurt at lunch.   Prednisone 10 mg take  4 each am x 2 days,   2 each am x 2 days,  1 each am x 2 days and stop   Call if not 100% better p 3 week for CT  Sinus

## 2014-03-26 NOTE — Progress Notes (Signed)
Subjective:    Patient ID: Barry [name], male    DOB: 1946-06-04 .   MRN: [id number]   Brief patient profile:  68yowm never smoker with relatively mild asthma but superimposed on obesity plus a chronically paralyzed left hemidiaphragm and a tendency to upper airway cough syndrome.    History of Present Illness  03/24/12 [name] ov for sinusitis > rx levaquin > sinus CT > Negative paranasal sinuses.  09/23/2012 Acute OV  Returns for persistent cough and congestion Complains of sinus  congestion, PND, throat clearing, small amounts of green mucus, increased SOB, wheezing x2.5weeks - finished augmentin 2 days and pred taper 3 days ago.  Does feel some better but cough is not gone and still has green mucus.  No fever or edema. No hemoptysis.  No n/v/d. Has wheezing on/off  rec Levaquin 750 mg one daily x 5 days Prednisone taper over next week.   Hydromet 1-2 tsp every 6-8 hr As needed   Fluids and rest .       01/05/14 Follow up and med review  Returns for follow up and med calendar  We reviewed all his meds and organized them into a med calendar with pt education.  Appears he is taking his meds correctly.  Had flare 2 months ago, tx w/ zpack and steroid taper-. Unable to take zpack d/t GI issues.  Cough and wheezing improved.  Had recent Colitis flare , on slow steroid taper rec prevnar given     03/08/2014 f/u ov/Barry [name] re: flare x one week off tramadol  Chief Complaint  Patient presents with  . Acute Visit    Pt c/o increased SOB, wheezing, sinus pressure, and prod cough with lare amounts of green sputum x 4 days.   using rescue 3 -4 x since onset, none at all on day of ov  rec Augmentin 875 mg take one pill twice daily  X 10 days - take at breakfast and supper with large glass of water.   If not improving > Prednisone 10 mg take  4 each am x 2 days,   2 each am x 2 days,  1 each am x 2  and stop    03/26/2014 f/u ov/Barry [name] re: ab flare ? Sinus dz Chief Complaint  Patient  presents with  . Follow-up    Pt c/o increased chest congestion for the past 3 days- worse the past day. Breathing is no better since last visit. He also c/o prod cough with minimal thick, tan to green sputum. For the past wk using rescue inhaler 1 to 2 times per day  was better p last ov, then gradually x 3 days  Worse, never took prednisone.  Last saba one day prior to OV     No obvious day to day or daytime variabilty or assoc   cp or chest tightness, subjective wheeze overt   hb symptoms. No unusual exp hx or h/o childhood pna/ asthma or knowledge of premature birth.  Sleeping ok without nocturnal  or early am exacerbation  of respiratory  c/o's or need for noct saba. Also denies any obvious fluctuation of symptoms with weather or environmental changes or other aggravating or alleviating factors except as outlined above   Current Medications, Allergies, Complete Past Medical History, Past Surgical History, Family History, and Social History were reviewed in [employer] record.  ROS  The following are not active complaints unless bolded sore throat, dysphagia, dental problems, itching, sneezing,  nasal congestion or  excess/ purulent secretions, ear ache,   fever, chills, sweats, unintended wt loss, pleuritic or exertional cp, hemoptysis,  orthopnea pnd or leg swelling, presyncope, palpitations, heartburn, abdominal pain, anorexia, nausea, vomiting, diarrhea  or change in bowel or urinary habits, change in stools or urine, dysuria,hematuria,  rash, arthralgias, visual complaints, headache, numbness weakness or ataxia or problems with walking or coordination,  change in mood/affect or memory.                      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          Objective:   Physical Exam GEN: A/Ox3; pleasant , NAD, obese  Chronic abn gait/ hoarse   Wt  233 06/06/11  >> 233 09/18/2013 > 11/05/2013 226 >231 01/06/2014 > 238 03/08/2014 > 03/26/2014 239   HEENT:  [place]/AT,  EACs-wax   NOSE-clear drainage,  Non tender sinus  THROAT-clear, no lesions, no postnasal drip or exudate noted. Class 3 airway  NECK:  Supple w/ fair ROM; no JVD; normal carotid impulses w/o bruits; no thyromegaly or nodules palpated; no lymphadenopathy.  RESP no significant  exp wheeze or cough on exp   CARD:  RRR, no m/r/g  , no peripheral edema, pulses intact, no cyanosis or clubbing.  GI:   Soft & nt; nml bowel sounds; no organomegaly or masses detected.  Musco: Warm bil, walks with cane        sinus ct 08/07/2013  Short air-fluid levels in the maxillary sinuses bilaterally suggesting early acute sinusitis.  Assessment & Plan:

## 2014-03-27 ENCOUNTER — Encounter: Payer: Self-pay | Admitting: Internal Medicine

## 2014-03-27 NOTE — Assessment & Plan Note (Addendum)
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  - HFA 25% August 01, 2009 > 50% November 01, 2009 > 50% January 31, 2010 therefore changed to neb bud/brovana - HFA 75% p extensive coaching 08/07/2013  > insurance issues so try symbicort 160 2 bid instead of neb  DDX of  difficult airways managment all start with A and  include Adherence, Ace Inhibitors, Acid Reflux, Active Sinus Disease, Alpha 1 Antitripsin deficiency, Anxiety masquerading as Airways dz,  ABPA,  allergy(esp in young), Aspiration (esp in elderly), Adverse effects of DPI,  Active smokers, plus two Bs  = Bronchiectasis and Beta blocker use..and one C= CHF  Adherence is always the initial "prime suspect" and is a multilayered concern that requires a "trust but verify" approach in every patient - starting with knowing how to use medications, especially inhalers, correctly, keeping up with refills and understanding the fundamental difference between maintenance and prns vs those medications only taken for a very short course and then stopped and not refilled - this was done in terms of med calendar review  ? Active sinus dz> rx with 21 days of augmentin then sinus ct next   ? Allergy > Prednisone 10 mg take  4 each am x 2 days,   2 each am x 2 days,  1 each am x 2 days and stop   See instructions for specific recommendations which were reviewed directly with the patient who was given a copy with highlighter outlining the key components.

## 2014-03-27 NOTE — Assessment & Plan Note (Signed)
-   03/24/12 CT Sinus > Negative paranasal sinuses - Sinus CT 08/07/2013 >>Short air-fluid levels in the maxillary sinuses bilaterally suggesting early acute sinusitis - Repeat augmentin x 21 days 03/27/2014 then sinus ct if not better  In meantime rec also continue sinus rinses

## 2014-04-26 ENCOUNTER — Telehealth: Payer: Self-pay | Admitting: Internal Medicine

## 2014-04-26 DIAGNOSIS — J310 Chronic rhinitis: Secondary | ICD-10-CM

## 2014-04-26 DIAGNOSIS — J45909 Unspecified asthma, uncomplicated: Secondary | ICD-10-CM

## 2014-04-26 NOTE — Telephone Encounter (Signed)
Patient Instructions      Augmentin 875 mg take one pill twice daily  X 21days - take at breakfast and supper with large glass of water.  It would help reduce the usual side effects (diarrhea and yeast infections) if you ate cultured yogurt at lunch.  Prednisone 10 mg take  4 each am x 2 days,   2 each am x 2 days,  1 each am x 2 days and stop  Call if not 100% better p 3 week for CT  Sinus    ---   Called spoke with pt. He is wanting to get scheduled for sinus CT. C/o nasal cong, frequent coughing and no energy. Please advise MW if okay to do so? thanks

## 2014-04-26 NOTE — Telephone Encounter (Signed)
Fine with me - limited sinus CT

## 2014-04-26 NOTE — Telephone Encounter (Signed)
CT sinus order placed.  Pt aware he will receive a call regarding date, time,and location of scan.  He verbalized understanding and voiced no further questions or concerns at this time.

## 2014-04-30 ENCOUNTER — Ambulatory Visit (INDEPENDENT_AMBULATORY_CARE_PROVIDER_SITE_OTHER)
Admission: RE | Admit: 2014-04-30 | Discharge: 2014-04-30 | Disposition: A | Discharge status: Home / Self Care | Payer: Medicare Other | Source: Ambulatory Visit | Attending: Internal Medicine | Admitting: Internal Medicine

## 2014-04-30 ENCOUNTER — Encounter: Payer: Self-pay | Admitting: Internal Medicine

## 2014-04-30 DIAGNOSIS — R059 Cough, unspecified: Secondary | ICD-10-CM | POA: Diagnosis not present

## 2014-04-30 DIAGNOSIS — R05 Cough: Secondary | ICD-10-CM | POA: Diagnosis not present

## 2014-04-30 DIAGNOSIS — J310 Chronic rhinitis: Secondary | ICD-10-CM | POA: Diagnosis not present

## 2014-04-30 NOTE — Progress Notes (Signed)
Quick Note:  Spoke with pt and notified of results per Dr. [name]. Pt verbalized understanding and denied any questions.  ______ 

## 2014-05-24 DIAGNOSIS — IMO0002 Reserved for concepts with insufficient information to code with codable children: Secondary | ICD-10-CM | POA: Diagnosis not present

## 2014-05-24 DIAGNOSIS — M47817 Spondylosis without myelopathy or radiculopathy, lumbosacral region: Secondary | ICD-10-CM | POA: Diagnosis not present

## 2014-05-24 DIAGNOSIS — G894 Chronic pain syndrome: Secondary | ICD-10-CM | POA: Diagnosis not present

## 2014-05-24 DIAGNOSIS — M1710 Unilateral primary osteoarthritis, unspecified knee: Secondary | ICD-10-CM | POA: Diagnosis not present

## 2014-05-24 DIAGNOSIS — M47812 Spondylosis without myelopathy or radiculopathy, cervical region: Secondary | ICD-10-CM | POA: Diagnosis not present

## 2014-05-31 DIAGNOSIS — M1990 Unspecified osteoarthritis, unspecified site: Secondary | ICD-10-CM | POA: Diagnosis not present

## 2014-05-31 DIAGNOSIS — Z125 Encounter for screening for malignant neoplasm of prostate: Secondary | ICD-10-CM | POA: Diagnosis not present

## 2014-05-31 DIAGNOSIS — I10 Essential (primary) hypertension: Secondary | ICD-10-CM | POA: Diagnosis not present

## 2014-05-31 DIAGNOSIS — Z0000 Encounter for general adult medical examination without abnormal findings: Secondary | ICD-10-CM | POA: Diagnosis not present

## 2014-05-31 DIAGNOSIS — K5100 Ulcerative (chronic) pancolitis without complications: Secondary | ICD-10-CM | POA: Diagnosis not present

## 2014-05-31 DIAGNOSIS — J45909 Unspecified asthma, uncomplicated: Secondary | ICD-10-CM | POA: Diagnosis not present

## 2014-05-31 DIAGNOSIS — I2510 Atherosclerotic heart disease of native coronary artery without angina pectoris: Secondary | ICD-10-CM | POA: Diagnosis not present

## 2014-05-31 DIAGNOSIS — Z86010 Personal history of colonic polyps: Secondary | ICD-10-CM | POA: Diagnosis not present

## 2014-06-24 ENCOUNTER — Encounter: Payer: Self-pay | Admitting: Internal Medicine

## 2014-06-24 ENCOUNTER — Ambulatory Visit (INDEPENDENT_AMBULATORY_CARE_PROVIDER_SITE_OTHER): Payer: Medicare Other | Admitting: Internal Medicine

## 2014-06-24 VITALS — BP 124/70 | HR 86 | Temp 98.4°F | Ht 63.0 in | Wt 238.0 lb

## 2014-06-24 DIAGNOSIS — J45909 Unspecified asthma, uncomplicated: Secondary | ICD-10-CM | POA: Diagnosis not present

## 2014-06-24 MED ORDER — PREDNISONE 10 MG PO TABS: ORAL_TABLET | ORAL | Status: DC

## 2014-06-24 MED ORDER — AMOXICILLIN-POT CLAVULANATE 875-125 MG PO TABS: 1.0000 | ORAL_TABLET | Freq: Two times a day (BID) | ORAL | Status: DC

## 2014-06-24 NOTE — Progress Notes (Signed)
Subjective:    Patient ID: Barry [name], male    DOB: 10-19-1946 .   MRN: [id number]   Brief patient profile:  68yowm never smoker with relatively mild asthma but superimposed on obesity plus a chronically paralyzed left hemidiaphragm and a tendency to upper airway cough syndrome.    History of Present Illness  03/24/12 Barry [name] ov for sinusitis > rx levaquin > sinus CT > Negative paranasal sinuses.  09/23/2012 Acute OV  Returns for persistent cough and congestion Complains of sinus  congestion, PND, throat clearing, small amounts of green mucus, increased SOB, wheezing x2.5weeks - finished augmentin 2 days and pred taper 3 days ago.  Does feel some better but cough is not gone and still has green mucus.  No fever or edema. No hemoptysis.  No n/v/d. Has wheezing on/off  rec Levaquin 750 mg one daily x 5 days Prednisone taper over next week.   Hydromet 1-2 tsp every 6-8 hr As needed   Fluids and rest .       01/05/14 Follow up and med review  Returns for follow up and med calendar  We reviewed all his meds and organized them into a med calendar with pt education.  Appears he is taking his meds correctly.  Had flare 2 months ago, tx w/ zpack and steroid taper-. Unable to take zpack d/t GI issues.  Cough and wheezing improved.  Had recent Colitis flare , on slow steroid taper rec prevnar given     03/08/2014 f/u ov/Barry [name] re: flare x one week off tramadol  Chief Complaint  Patient presents with  . Acute Visit    Pt c/o increased SOB, wheezing, sinus pressure, and prod cough with lare amounts of green sputum x 4 days.   using rescue 3 -4 x since onset, none at all on day of ov  rec Augmentin 875 mg take one pill twice daily  X 10 days - take at breakfast and supper with large glass of water.   If not improving > Prednisone 10 mg take  4 each am x 2 days,   2 each am x 2 days,  1 each am x 2  and stop    03/26/2014 f/u ov/Barry [name] re: ab flare ? Sinus dz Chief Complaint  Patient  presents with  . Follow-up    Pt c/o increased chest congestion for the past 3 days- worse the past day. Breathing is no better since last visit. He also c/o prod cough with minimal thick, tan to green sputum. For the past wk using rescue inhaler 1 to 2 times per day  was better p last ov, then gradually x 3 days  Worse, never took prednisone.  Last saba one day prior to OV   Augmentin 875 mg take one pill twice daily  X 21days - take at breakfast and supper with large glass of water.  It would help reduce the usual side effects (diarrhea and yeast infections) if you ate cultured yogurt at lunch.  Prednisone 10 mg take  4 each am x 2 days,   2 each am x 2 days,  1 each am x 2 days and stop     06/24/2014 f/u ov/Barry [name] re:  ? aeAB Chief Complaint  Patient presents with  . Follow-up    Pt c/o increased cough for the past wk- prod with large amounts of tan to green sputum.  He also has noticed slight increase in SOB and has used proair x 1 in the  past wk.      Onset was abrupt "like a head cold" - no ferver, some nasal congestion also / no better on 1st gen H1   No obvious day to day or daytime variabilty or assoc   cp or chest tightness, subjective wheeze overt   hb symptoms. No unusual exp hx or h/o childhood pna/ asthma or knowledge of premature birth.  Sleeping ok without nocturnal  or early am exacerbation  of respiratory  c/o's or need for noct saba. Also denies any obvious fluctuation of symptoms with weather or environmental changes or other aggravating or alleviating factors except as outlined above   Current Medications, Allergies, Complete Past Medical History, Past Surgical History, Family History, and Social History were reviewed in [employer] record.  ROS  The following are not active complaints unless bolded sore throat, dysphagia, dental problems, itching, sneezing,  nasal congestion or excess/ purulent secretions, ear ache,   fever, chills, sweats,  unintended wt loss, pleuritic or exertional cp, hemoptysis,  orthopnea pnd or leg swelling, presyncope, palpitations, heartburn, abdominal pain, anorexia, nausea, vomiting, diarrhea  or change in bowel or urinary habits, change in stools or urine, dysuria,hematuria,  rash, arthralgias, visual complaints, headache, numbness weakness or ataxia or problems with walking or coordination,  change in mood/affect or memory.                      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 Class 3 airway  NECK:  Supple w/ fair ROM; no JVD; normal carotid impulses w/o bruits; no thyromegaly or nodules palpated; no lymphadenopathy.  RESP trace bilateral  exp wheeze / min  cough on exp   CARD:  RRR, no m/r/g  , no peripheral edema, pulses intact, no cyanosis or clubbing.  GI:   Soft & nt;  nml bowel sounds; no organomegaly or masses detected.  Musco: Warm bil, walks with cane     04/26/14 Sinus CT  No evidence of sinusitis. Incomplete visualization of the inferior  aspect of the maxillary sinuses bilaterally.       Assessment & Plan:

## 2014-06-24 NOTE — Patient Instructions (Addendum)
Augmentin 875 mg take one pill twice daily  X 10 days - take at breakfast and supper with large glass of water.  It would help reduce the usual side effects (diarrhea and yeast infections) if you ate cultured yogurt at lunch.   Prednisone 10 mg take  4 each am x 2 days,   2 each am x 2 days,  1 each am x 2 days and stop  Please schedule a follow up visit in 3 months but call sooner if needed with your spacer and inhalers in hand

## 2014-06-25 NOTE — Assessment & Plan Note (Signed)
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  - HFA 25% August 01, 2009 > 50% November 01, 2009 > 50% January 31, 2010 therefore changed to neb bud/brovana - HFA 75% p extensive coaching 08/07/2013  > insurance issues so try symbicort 160 2 bid instead of neb  DDX of  difficult airways management all start with A and  include Adherence, Ace Inhibitors, Acid Reflux, Active Sinus Disease, Alpha 1 Antitripsin deficiency, Anxiety masquerading as Airways dz,  ABPA,  allergy(esp in young), Aspiration (esp in elderly), Adverse effects of DPI,  Active smokers, plus two Bs  = Bronchiectasis and Beta blocker use..and one C= CHF  Adherence is always the initial "prime suspect" and is a multilayered concern that requires a "trust but verify" approach in every patient - starting with knowing how to use medications, especially inhalers, correctly, keeping up with refills and understanding the fundamental difference between maintenance and prns vs those medications only taken for a very short course and then stopped and not refilled.  - seems to be doing better with med cal - The proper method of use, as well as anticipated side effects, of a metered-dose inhaler are discussed and demonstrated to the patient. Improved effectiveness after extensive coaching during this visit to a level of approximately  90% s spacer so discouraged from using spacer unless mouth/ throat irritation   ? Active sinus dz > unfortunately last ct sinus was not complete > Augmentin 875 mg take one pill twice daily  X 10 days then full sinus ct and perhaps ent eval if still pos for sinusitis  ? Acid (or non-acid) GERD > always difficult to exclude as up to 75% of pts in some series report no assoc GI/ Heartburn symptoms> rec cont max (24h)  acid suppression and diet restrictions/ reviewed and instructions given in writing.    ? Allergy > Prednisone 10 mg take  4 each am x 2 days,   2  each am x 2 days,  1 each am x 2 days and stop     Each maintenance medication was reviewed in detail including most importantly the difference between maintenance and as needed and under what circumstances the prns are to be used. This was done in the context of a medication calendar review which provided the patient with a user-friendly unambiguous mechanism for medication administration and reconciliation and provides an action plan for all active problems. It is critical that this be shown to every doctor  for modification during the office visit if necessary so the patient can use it as a working document.     See instructions for specific recommendations which were reviewed directly with the patient who was given a copy with highlighter outlining the key components.

## 2014-06-28 ENCOUNTER — Ambulatory Visit (INDEPENDENT_AMBULATORY_CARE_PROVIDER_SITE_OTHER): Payer: Medicare Other | Admitting: Ophthalmology

## 2014-06-28 DIAGNOSIS — H35039 Hypertensive retinopathy, unspecified eye: Secondary | ICD-10-CM | POA: Diagnosis not present

## 2014-06-28 DIAGNOSIS — H2510 Age-related nuclear cataract, unspecified eye: Secondary | ICD-10-CM

## 2014-06-28 DIAGNOSIS — I10 Essential (primary) hypertension: Secondary | ICD-10-CM

## 2014-06-28 DIAGNOSIS — H35379 Puckering of macula, unspecified eye: Secondary | ICD-10-CM | POA: Diagnosis not present

## 2014-06-28 DIAGNOSIS — H3530 Unspecified macular degeneration: Secondary | ICD-10-CM

## 2014-06-28 DIAGNOSIS — H43819 Vitreous degeneration, unspecified eye: Secondary | ICD-10-CM

## 2014-06-29 ENCOUNTER — Ambulatory Visit (INDEPENDENT_AMBULATORY_CARE_PROVIDER_SITE_OTHER): Payer: Medicare Other | Admitting: Ophthalmology

## 2014-07-28 ENCOUNTER — Other Ambulatory Visit: Payer: Self-pay | Admitting: Internal Medicine

## 2014-08-13 DIAGNOSIS — I252 Old myocardial infarction: Secondary | ICD-10-CM | POA: Diagnosis not present

## 2014-08-13 DIAGNOSIS — I10 Essential (primary) hypertension: Secondary | ICD-10-CM | POA: Diagnosis not present

## 2014-08-13 DIAGNOSIS — E785 Hyperlipidemia, unspecified: Secondary | ICD-10-CM | POA: Diagnosis not present

## 2014-08-13 DIAGNOSIS — I2510 Atherosclerotic heart disease of native coronary artery without angina pectoris: Secondary | ICD-10-CM | POA: Diagnosis not present

## 2014-08-13 DIAGNOSIS — E669 Obesity, unspecified: Secondary | ICD-10-CM | POA: Diagnosis not present

## 2014-08-16 DIAGNOSIS — M47812 Spondylosis without myelopathy or radiculopathy, cervical region: Secondary | ICD-10-CM | POA: Diagnosis not present

## 2014-08-16 DIAGNOSIS — G894 Chronic pain syndrome: Secondary | ICD-10-CM | POA: Diagnosis not present

## 2014-08-16 DIAGNOSIS — M48061 Spinal stenosis, lumbar region without neurogenic claudication: Secondary | ICD-10-CM | POA: Diagnosis not present

## 2014-08-16 DIAGNOSIS — M1990 Unspecified osteoarthritis, unspecified site: Secondary | ICD-10-CM | POA: Diagnosis not present

## 2014-09-14 IMAGING — CT CT PARANASAL SINUSES LIMITED
1 of 2 series · 16 of 30 positions shown, 20 images · non-contrast
Comparison: 08/07/2013

CLINICAL DATA: Cough, congestion.

EXAM:
CT PARANASAL SINUS WITHOUT CONTRAST
TECHNIQUE: Multidetector CT images of the paranasal sinuses were obtained using
the standard protocol without intravenous contrast.

[Series 5: ltd sinus sup 3.0 h30s · axial · 0.36mm/px · z∈[-121,-43]mm · 16 of 30 slices shown, 20 images]
[im 2/30  brain]
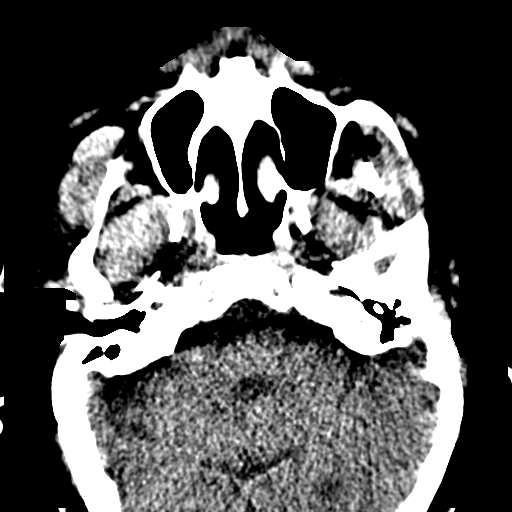
[im 2/30  bone]
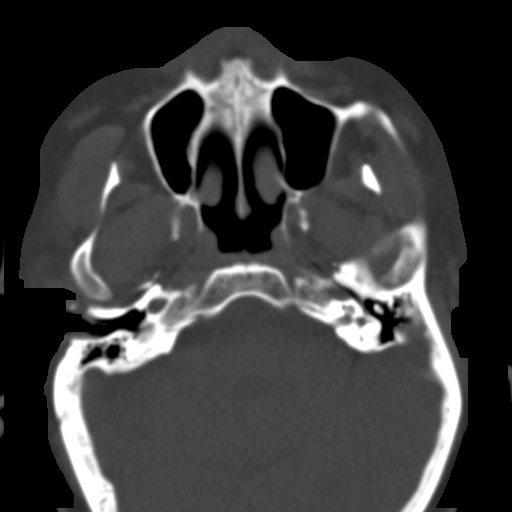
[im 4/30  bone]
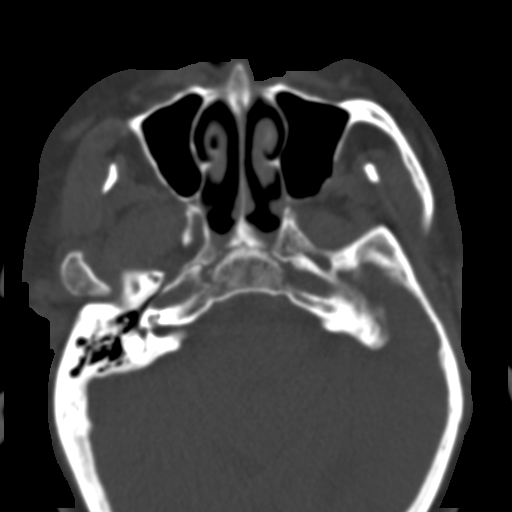
[im 6/30  bone]
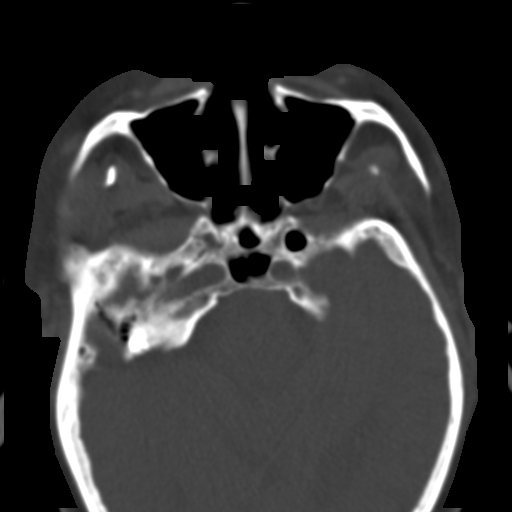
[im 7/30  bone]
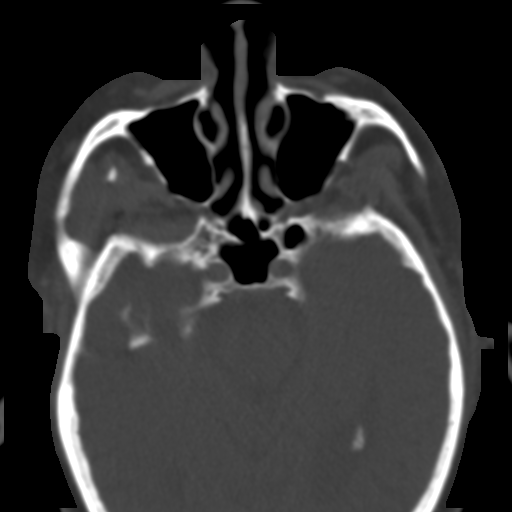
[im 9/30  brain]
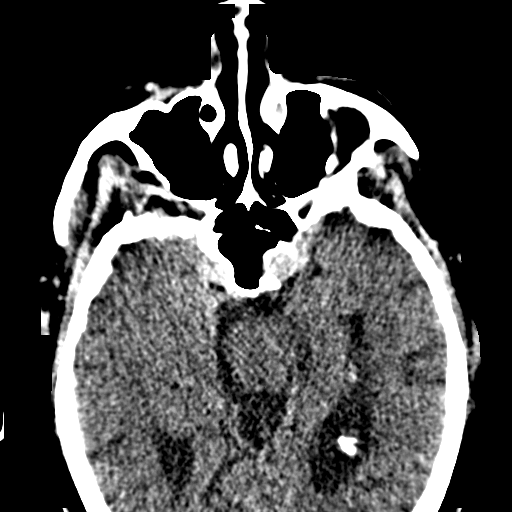
[im 9/30  bone]
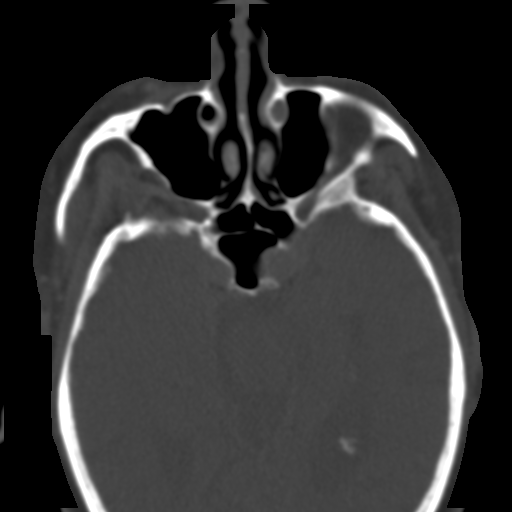
[im 11/30  bone]
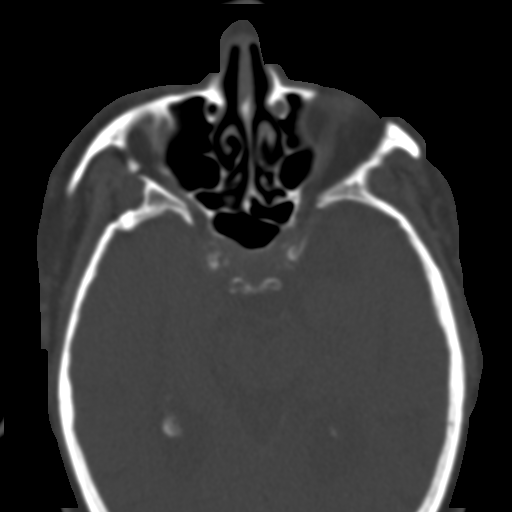
[im 12/30  bone]
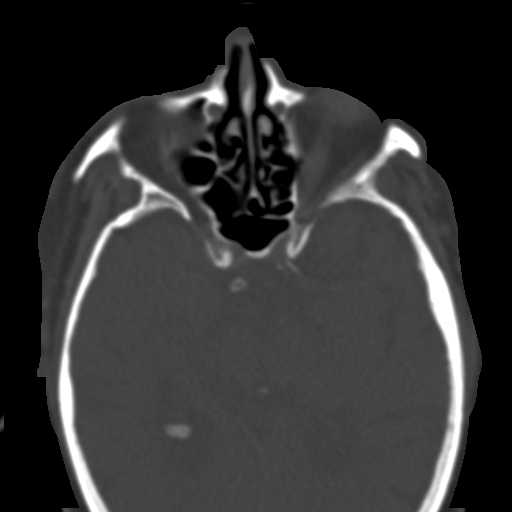
[im 14/30  bone]
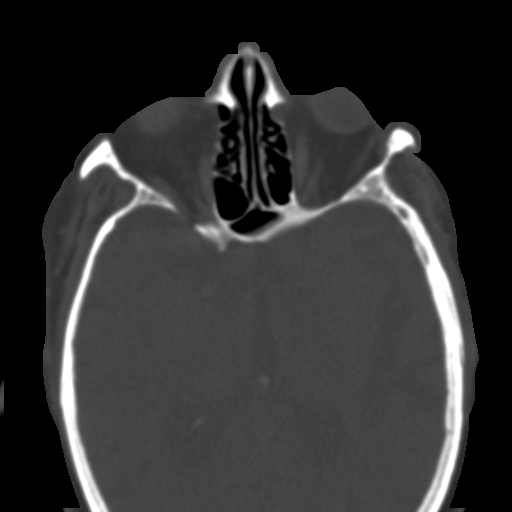
[im 16/30  brain]
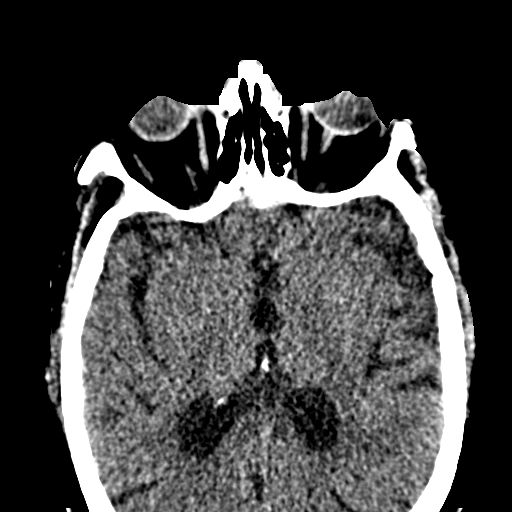
[im 16/30  bone]
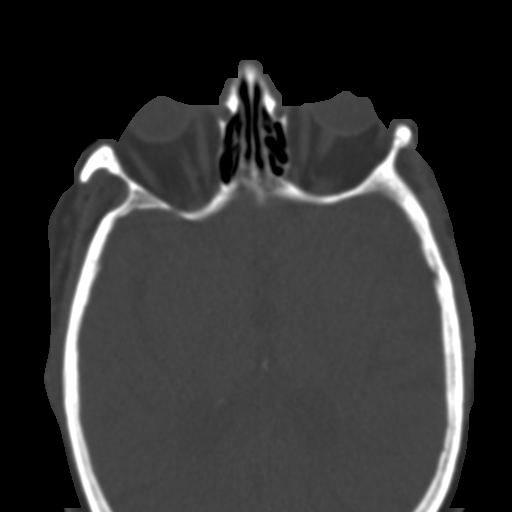
[im 18/30  bone]
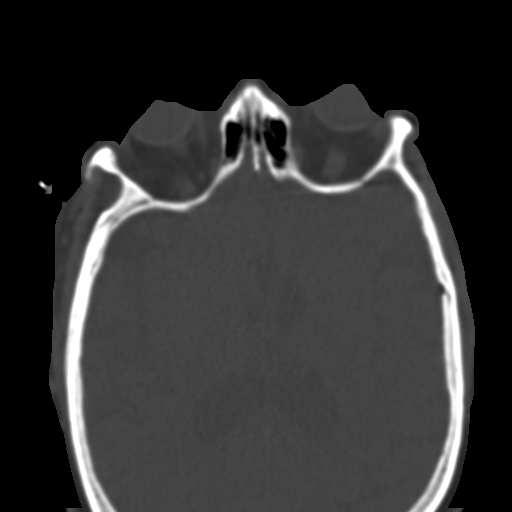
[im 19/30  bone]
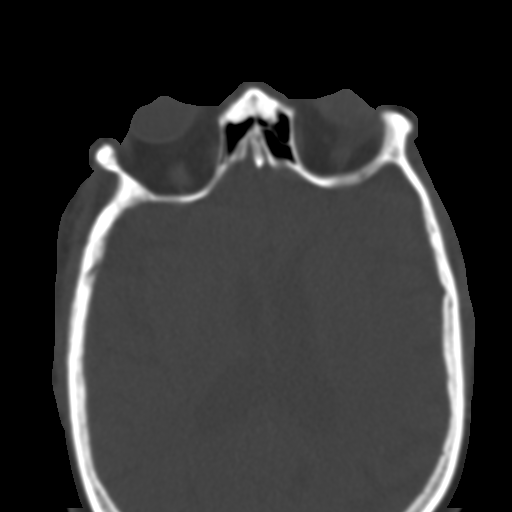
[im 21/30  bone]
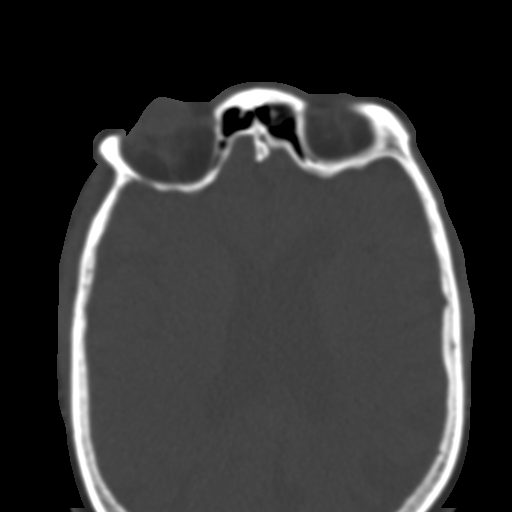
[im 23/30  brain]
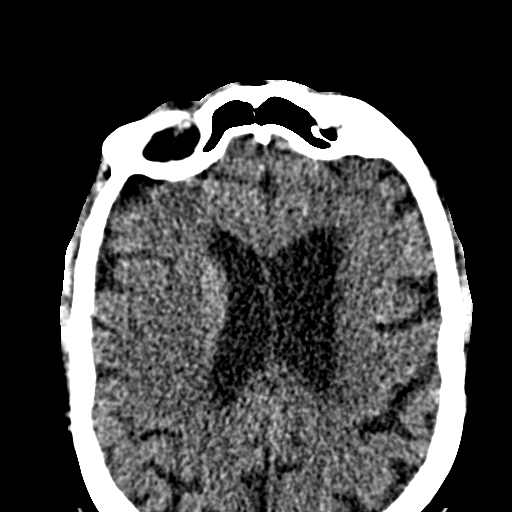
[im 23/30  bone]
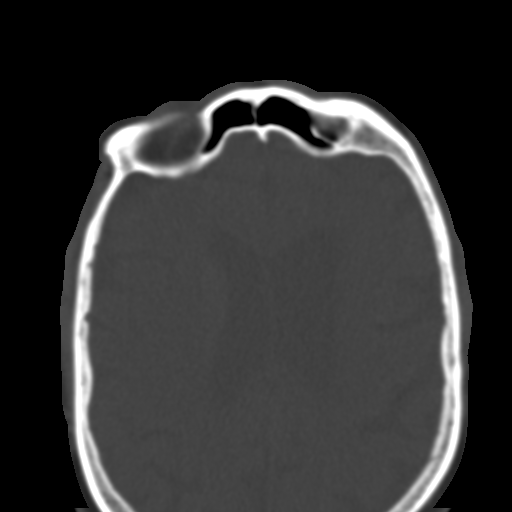
[im 24/30  bone]
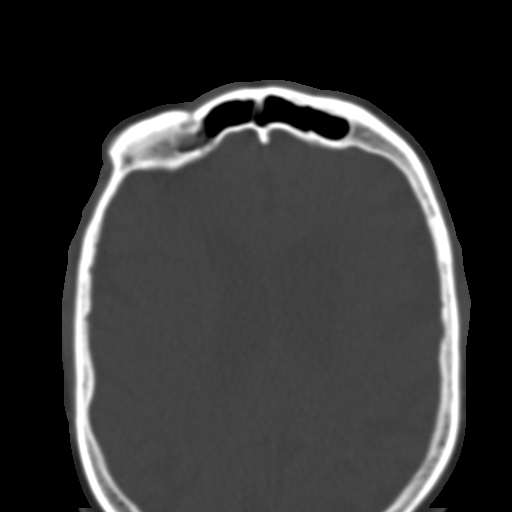
[im 26/30  bone]
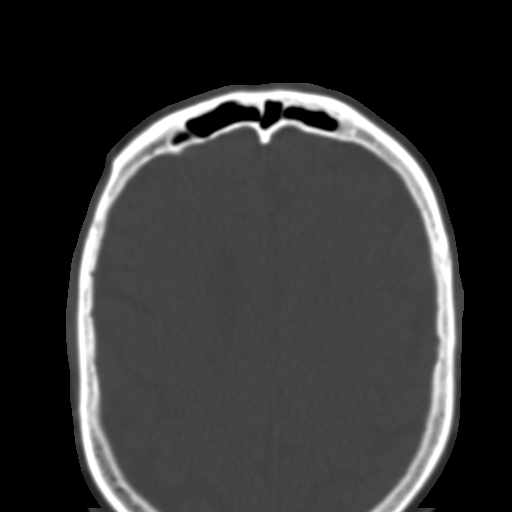
[im 28/30  bone]
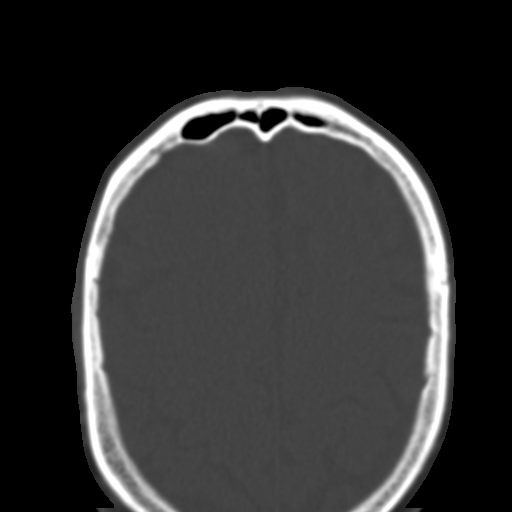

[16 of 30 positions shown; findings below may reference images not displayed]

FINDINGS: Paranasal sinuses are clear. No air-fluid levels or mucosal
thickening. Visualized mastoid air cells are clear. The floors of
the maxillary sinuses were incompletely imaged. Orbital soft tissues
unremarkable.
IMPRESSION: No evidence of sinusitis. Incomplete visualization of the inferior
aspect of the maxillary sinuses bilaterally.

## 2014-09-24 ENCOUNTER — Ambulatory Visit (INDEPENDENT_AMBULATORY_CARE_PROVIDER_SITE_OTHER): Payer: Medicare Other | Admitting: Internal Medicine

## 2014-09-24 ENCOUNTER — Encounter: Payer: Self-pay | Admitting: Internal Medicine

## 2014-09-24 VITALS — BP 118/78 | HR 84 | Temp 98.4°F | Ht 63.0 in | Wt 240.4 lb

## 2014-09-24 DIAGNOSIS — J4530 Mild persistent asthma, uncomplicated: Secondary | ICD-10-CM

## 2014-09-24 NOTE — Patient Instructions (Signed)
Work on inhaler technique:  relax and gently blow all the way out then take a nice smooth deep breath back in, triggering the inhaler at same time you start breathing in.  Hold for up to 5 seconds if you can.  Rinse and gargle with water when done      Please schedule a follow up visit in 3 months but call sooner if needed - bring inhaler with spacer with you

## 2014-09-24 NOTE — Progress Notes (Signed)
Subjective:    Patient ID: Barry [name], male    DOB: Sep 28, 1946 .   MRN: [id number]   Brief patient profile:  68 yowm never smoker with relatively mild asthma but superimposed on obesity plus a chronically paralyzed left hemidiaphragm and a tendency to upper airway cough syndrome.    History of Present Illness  03/24/12 [name] ov for sinusitis > rx levaquin > sinus CT > Negative paranasal sinuses.     03/08/2014 f/u ov/Barry [name] re: flare x one week off tramadol  Chief Complaint  Patient presents with  . Acute Visit    Pt c/o increased SOB, wheezing, sinus pressure, and prod cough with lare amounts of green sputum x 4 days.   using rescue 3 -4 x since onset, none at all on day of ov  rec Augmentin 875 mg take one pill twice daily  X 10 days - take at breakfast and supper with large glass of water.   If not improving > Prednisone 10 mg take  4 each am x 2 days,   2 each am x 2 days,  1 each am x 2  and stop    03/26/2014 f/u ov/Barry [name] re: ab flare ? Sinus dz Chief Complaint  Patient presents with  . Follow-up    Pt c/o increased chest congestion for the past 3 days- worse the past day. Breathing is no better since last visit. He also c/o prod cough with minimal thick, tan to green sputum. For the past wk using rescue inhaler 1 to 2 times per day  was better p last ov, then gradually x 3 days  Worse, never took prednisone.  Last saba one day prior to OV   Augmentin 875 mg take one pill twice daily  X 21days - take at breakfast and supper with large glass of water.  It would help reduce the usual side effects (diarrhea and yeast infections) if you ate cultured yogurt at lunch.  Prednisone 10 mg take  4 each am x 2 days,   2 each am x 2 days,  1 each am x 2 days and stop     06/24/2014 f/u ov/Barry [name] re:  ? aeAB Chief Complaint  Patient presents with  . Follow-up    Pt c/o increased cough for the past wk- prod with large amounts of tan to green sputum.  He also has noticed slight increase in  SOB and has used proair x 1 in the past wk.      Onset was abrupt "like a head cold" - no fever, some nasal congestion also / no better on 1st gen H1  rec Augmentin 875 mg take one pill twice daily  X 10 days  Prednisone 10 mg take  4 each am x 2 days,   2 each am x 2 days,  1 each am x 2 days and stop     09/24/2014 f/u ov/Barry [name] re: asthma/ poor hfa even with spacer  On symbicort 160 2 bid Chief Complaint  Patient presents with  . Follow-up  improved p last ov with flare  - more limited by legs than breathing at baseline, only needs saba maybe once or twice daily     No obvious day to day or daytime variabilty or assoc   cp or chest tightness, subjective wheeze overt   hb symptoms. No unusual exp hx or h/o childhood pna/ asthma or knowledge of premature birth.  Sleeping ok without nocturnal  or early am exacerbation  of  respiratory  c/o's or need for noct saba. Also denies any obvious fluctuation of symptoms with weather or environmental changes or other aggravating or alleviating factors except as outlined above   Current Medications, Allergies, Complete Past Medical History, Past Surgical History, Family History, and Social History were reviewed in [employer] record.  ROS  The following are not active complaints unless bolded sore throat, dysphagia, dental problems, itching, sneezing,  nasal congestion or excess/ purulent secretions, ear ache,   fever, chills, sweats, unintended wt loss, pleuritic or exertional cp, hemoptysis,  orthopnea pnd or leg swelling, presyncope, palpitations, heartburn, abdominal pain, anorexia, nausea, vomiting, diarrhea  or change in bowel or urinary habits, change in stools or urine, dysuria,hematuria,  rash, arthralgias, visual complaints, headache, numbness weakness or ataxia or problems with walking or coordination,  change in mood/affect or memory.                      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  no alcohol  single  no children  disabled: textiles         Objective:   Physical Exam GEN: A/Ox3; pleasant , NAD, obese  Chronic abn gait/ hoarse   Wt  233 06/06/11  >> 233 09/18/2013 > 11/05/2013 226 >231 01/06/2014 > 238 03/08/2014 > 03/26/2014 239 > 06/24/2014 238  HEENT:  [place]/AT,  EACs-wax   NOSE-clear drainage,  Non tender sinus  THROAT-clear, no lesions, no postnasal drip or exudate noted. Class 3 airway  NECK:  Supple w/ fair ROM; no JVD; normal carotid impulses w/o bruits; no thyromegaly or nodules palpated; no lymphadenopathy.  RESP trace bilateral  exp wheeze / min  cough on exp   CARD:  RRR, no m/r/g  , no peripheral edema, pulses intact, no cyanosis or clubbing.  GI:   Soft & nt; nml bowel sounds; no organomegaly or masses detected.  Musco: Warm bil, walks with cane     04/26/14 Sinus CT  No evidence of sinusitis. Incomplete visualization of the inferior  aspect of the maxillary sinuses bilaterally.       Assessment & Plan:

## 2014-09-26 NOTE — Assessment & Plan Note (Signed)
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  - HFA 25% August 01, 2009 > 50% November 01, 2009 > 50% January 31, 2010 therefore changed to neb bud/brovana - HFA 75% p extensive coaching 08/07/2013  > insurance issues so try symbicort 160 2 bid instead of neb   - 09/24/14  p extensive coaching HFA effectiveness =    75% with  Spacer  Adequate control on present rx despite poor hfa , reviewed > no change in rx needed    I had an extended discussion with the patient today lasting 15 to 20 minutes of a 25 minute visit on the following issues:   Each maintenance medication was reviewed in detail including most importantly the difference between maintenance and as needed and under what circumstances the prns are to be used. This was done in the context of a medication calendar review which provided the patient with a user-friendly unambiguous mechanism for medication administration and reconciliation and provides an action plan for all active problems. It is critical that this be shown to every doctor  for modification during the office visit if necessary so the patient can use it as a working document.      If can't do better and still having freq flares time to move on to laba/ics neb as cough likely to be triggered by dpi

## 2014-11-09 DIAGNOSIS — G894 Chronic pain syndrome: Secondary | ICD-10-CM | POA: Diagnosis not present

## 2014-11-09 DIAGNOSIS — M179 Osteoarthritis of knee, unspecified: Secondary | ICD-10-CM | POA: Diagnosis not present

## 2014-11-09 DIAGNOSIS — M47812 Spondylosis without myelopathy or radiculopathy, cervical region: Secondary | ICD-10-CM | POA: Diagnosis not present

## 2014-11-09 DIAGNOSIS — Z79891 Long term (current) use of opiate analgesic: Secondary | ICD-10-CM | POA: Diagnosis not present

## 2015-02-11 ENCOUNTER — Ambulatory Visit (INDEPENDENT_AMBULATORY_CARE_PROVIDER_SITE_OTHER): Payer: Medicare Other | Admitting: Internal Medicine

## 2015-02-11 ENCOUNTER — Encounter: Payer: Self-pay | Admitting: Internal Medicine

## 2015-02-11 VITALS — BP 130/76 | HR 82 | Ht 63.0 in | Wt 233.2 lb

## 2015-02-11 DIAGNOSIS — J4540 Moderate persistent asthma, uncomplicated: Secondary | ICD-10-CM | POA: Diagnosis not present

## 2015-02-11 DIAGNOSIS — J310 Chronic rhinitis: Secondary | ICD-10-CM | POA: Diagnosis not present

## 2015-02-11 NOTE — Progress Notes (Signed)
Subjective:    Patient ID: Barry [name], male    DOB: 1946/12/09 .   MRN: [id number]   Brief patient profile:  38 yowm never smoker with relatively mild asthma but superimposed on obesity plus a chronically paralyzed left hemidiaphragm and a tendency to upper airway cough syndrome.    History of Present Illness  03/24/12 [name] ov for sinusitis > rx levaquin > sinus CT > Negative paranasal sinuses.     03/08/2014 f/u ov/Barry [name] re: flare x one week off tramadol  Chief Complaint  Patient presents with  . Acute Visit    Pt c/o increased SOB, wheezing, sinus pressure, and prod cough with lare amounts of green sputum x 4 days.   using rescue 3 -4 x since onset, none at all on day of ov  rec Augmentin 875 mg take one pill twice daily  X 10 days - take at breakfast and supper with large glass of water.   If not improving > Prednisone 10 mg take  4 each am x 2 days,   2 each am x 2 days,  1 each am x 2  and stop    03/26/2014 f/u ov/Barry [name] re: ab flare ? Sinus dz Chief Complaint  Patient presents with  . Follow-up    Pt c/o increased chest congestion for the past 3 days- worse the past day. Breathing is no better since last visit. He also c/o prod cough with minimal thick, tan to green sputum. For the past wk using rescue inhaler 1 to 2 times per day  was better p last ov, then gradually x 3 days  Worse, never took prednisone.  Last saba one day prior to OV   Augmentin 875 mg take one pill twice daily  X 21days - take at breakfast and supper with large glass of water.  It would help reduce the usual side effects (diarrhea and yeast infections) if you ate cultured yogurt at lunch.  Prednisone 10 mg take  4 each am x 2 days,   2 each am x 2 days,  1 each am x 2 days and stop     06/24/2014 f/u ov/Barry [name] re:  ? aeAB Chief Complaint  Patient presents with  . Follow-up    Pt c/o increased cough for the past wk- prod with large amounts of tan to green sputum.  He also has noticed slight increase in  SOB and has used proair x 1 in the past wk.      Onset was abrupt "like a head cold" - no fever, some nasal congestion also / no better on 1st gen H1  rec Augmentin 875 mg take one pill twice daily  X 10 days  Prednisone 10 mg take  4 each am x 2 days,   2 each am x 2 days,  1 each am x 2 days and stop     09/24/2014 f/u ov/Barry [name] re: asthma/ poor hfa even with spacer  On symbicort 160 2 bid Chief Complaint  Patient presents with  . Follow-up  improved p last ov with flare  - more limited by legs than breathing at baseline, only needs saba maybe once or twice daily  rec Work on inhaler technique:  relax and gently blow all the way out then take a nice smooth deep breath back in, triggering the inhaler at same time you start breathing in.  Hold for up to 5 seconds if you can.  Rinse and gargle with water when [name] Please  schedule a follow up visit in 3 months but call sooner if needed - bring inhaler with spacer with you    02/11/2015 f/u ov/Barry [name] re: asthma/ did not bring spacer as requested Chief Complaint  Patient presents with  . Follow-up    Pt states that his breathing is doing well- rarely uses SABA. He c/o sinus pressure and dry cough for the past wk.     Not limited by breathing from desired activities     No obvious day to day or daytime variabilty or assoc   cp or chest tightness, subjective wheeze overt  hb symptoms. No unusual exp hx or h/o childhood pna/ asthma or knowledge of premature birth.  Sleeping ok without nocturnal  or early am exacerbation  of respiratory  c/o's or need for noct saba. Also denies any obvious fluctuation of symptoms with weather or environmental changes or other aggravating or alleviating factors except as outlined above   Current Medications, Allergies, Complete Past Medical History, Past Surgical History, Family History, and Social History were reviewed in [employer] record.  ROS  The following are not active complaints  unless bolded sore throat, dysphagia, dental problems, itching, sneezing,  nasal congestion or excess/ purulent secretions, ear ache,   fever, chills, sweats, unintended wt loss, pleuritic or exertional cp, hemoptysis,  orthopnea pnd or leg swelling, presyncope, palpitations, heartburn, abdominal pain, anorexia, nausea, vomiting, diarrhea  or change in bowel or urinary habits, change in stools or urine, dysuria,hematuria,  rash, arthralgias, visual complaints, headache, numbness weakness or ataxia or problems with walking or coordination,  change in mood/affect or memory.                      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  no alcohol  single  no children  disabled: textiles         Objective:   Physical Exam GEN: A/Ox3; pleasant , NAD, obese  Chronic abn gait/ mildly hoarse   Wt  233 06/06/11  >> 233 09/18/2013 > 11/05/2013 226 >231 01/06/2014 > 238 03/08/2014 > 03/26/2014 239 > 06/24/2014 238 >  02/11/15 233   HEENT:  Barry [name]/AT,  EACs-wax   NOSE-clear drainage,  Non tender sinus  THROAT-clear, no lesions, no postnasal drip or exudate noted. Class 3 airway  NECK:  Supple w/ fair ROM; no JVD; normal carotid impulses w/o bruits; no thyromegaly or nodules palpated; no  lymphadenopathy.  RESP clear to  A and P   CARD:  RRR, no m/r/g  , no peripheral edema, pulses intact, no cyanosis or clubbing.  GI:   Soft & nt; nml bowel sounds; no organomegaly or masses detected.  Musco: Warm bil, walks with cane           Assessment & Plan:

## 2015-02-11 NOTE — Patient Instructions (Signed)
flonase 1-2 puffs twice daily as per med calendar  [place] out through your nose  I emphasized that nasal steroids(flonase)  have no immediate benefit in terms of improving symptoms.  To help them reached the target tissue, the patient should use Afrin two puffs every 12 hours applied one min before using the nasal steroids.  Afrin should be stopped after no more than 5 days.  If the symptoms worsen, Afrin can be restarted after 5 days off of therapy to prevent rebound congestion from overuse of Afrin.  I also emphasized that in no way are nasal steroids a concern in terms of "addiction".   See [name] NP 3 months with all your medications, even over the counter meds, separated in two separate bags, the ones you take no matter what vs the ones you stop once you feel better and take only as needed when you feel you need them.   [name]  will generate for you a new user friendly medication calendar that will put [place] all on the same page re: your medication use.    Without this process, it simply isn't possible to assure that we are providing  your outpatient care  with  the attention to detail we feel you deserve.   If we cannot assure that you're getting that kind of care,  then we cannot manage your problem effectively from this clinic.  Once you have seen [name] and we are sure that we're all on the same page with your medication use she will arrange follow up with me.

## 2015-02-13 NOTE — Assessment & Plan Note (Signed)
-  PFTs 06/15/2005 FEV1 85% predicted ratio 68% and truncation of respiratory loop in a sawtooth pattern  - HFA 25% August 01, 2009 > 50% November 01, 2009 > 50% January 31, 2010 therefore changed to neb bud/brovana - HFA 75% p extensive coaching 08/07/2013  > insurance issues so try symbicort 160 2 bid instead of neb - 09/24/14  p extensive coaching HFA effectiveness =    75% with  spacer   All goals of chronic asthma control met including optimal function and elimination of symptoms with minimal need for rescue therapy.  Contingencies discussed in full including contacting this office immediately if not controlling the symptoms using the rule of two's.       Each maintenance medication was reviewed in detail including most importantly the difference between maintenance and as needed and under what circumstances the prns are to be used.  Please see instructions for details which were reviewed in writing and the patient given a copy.

## 2015-02-13 NOTE — Assessment & Plan Note (Signed)
-   03/24/12 CT Sinus > Negative paranasal sinuses - Sinus CT 08/07/2013 >>Short air-fluid levels in the maxillary sinuses bilaterally suggesting early acute sinusitis - Repeat augmentin x 21 days 03/27/2014 then sinus ct if not better   I had an extended discussion with the patient reviewing all relevant studies completed to date and  lasting 15 to 20 minutes of a 25 minute visit on the following ongoing concerns:  I reviewed both graphic and text formatted material regarding the diagnosis and management of rhinitis including how to use topical steroids effectively and why it is necessary to treat nasal obstruction and inflammation and infection all at  the same time to eradicate the cycle of inflammation causing obstruction causing an infection causing inflammation.  See instructions for specific recommendations which were reviewed directly with the patient who was given a copy with highlighter outlining the key components.

## 2015-02-24 ENCOUNTER — Emergency Department (HOSPITAL_COMMUNITY): Payer: Medicare Other

## 2015-02-24 ENCOUNTER — Emergency Department (HOSPITAL_COMMUNITY)
Admission: EM | Admit: 2015-02-24 | Discharge: 2015-02-24 | Disposition: A | Discharge status: Home / Self Care | Payer: Medicare Other | Attending: Emergency Medicine | Admitting: Emergency Medicine

## 2015-02-24 ENCOUNTER — Encounter (HOSPITAL_COMMUNITY): Payer: Self-pay | Admitting: *Deleted

## 2015-02-24 DIAGNOSIS — Y9289 Other specified places as the place of occurrence of the external cause: Secondary | ICD-10-CM | POA: Diagnosis not present

## 2015-02-24 DIAGNOSIS — Y998 Other external cause status: Secondary | ICD-10-CM | POA: Diagnosis not present

## 2015-02-24 DIAGNOSIS — J45909 Unspecified asthma, uncomplicated: Secondary | ICD-10-CM | POA: Insufficient documentation

## 2015-02-24 DIAGNOSIS — Z79899 Other long term (current) drug therapy: Secondary | ICD-10-CM | POA: Insufficient documentation

## 2015-02-24 DIAGNOSIS — Z791 Long term (current) use of non-steroidal anti-inflammatories (NSAID): Secondary | ICD-10-CM | POA: Diagnosis not present

## 2015-02-24 DIAGNOSIS — W5501XA Bitten by cat, initial encounter: Secondary | ICD-10-CM | POA: Insufficient documentation

## 2015-02-24 DIAGNOSIS — Z8719 Personal history of other diseases of the digestive system: Secondary | ICD-10-CM | POA: Insufficient documentation

## 2015-02-24 DIAGNOSIS — I2510 Atherosclerotic heart disease of native coronary artery without angina pectoris: Secondary | ICD-10-CM | POA: Insufficient documentation

## 2015-02-24 DIAGNOSIS — L03113 Cellulitis of right upper limb: Secondary | ICD-10-CM | POA: Diagnosis not present

## 2015-02-24 DIAGNOSIS — I10 Essential (primary) hypertension: Secondary | ICD-10-CM | POA: Diagnosis not present

## 2015-02-24 DIAGNOSIS — E669 Obesity, unspecified: Secondary | ICD-10-CM | POA: Diagnosis not present

## 2015-02-24 DIAGNOSIS — H409 Unspecified glaucoma: Secondary | ICD-10-CM | POA: Insufficient documentation

## 2015-02-24 DIAGNOSIS — Z23 Encounter for immunization: Secondary | ICD-10-CM | POA: Insufficient documentation

## 2015-02-24 DIAGNOSIS — S6991XA Unspecified injury of right wrist, hand and finger(s), initial encounter: Secondary | ICD-10-CM | POA: Diagnosis present

## 2015-02-24 DIAGNOSIS — S60571A Other superficial bite of hand of right hand, initial encounter: Secondary | ICD-10-CM | POA: Insufficient documentation

## 2015-02-24 DIAGNOSIS — Y9389 Activity, other specified: Secondary | ICD-10-CM | POA: Insufficient documentation

## 2015-02-24 DIAGNOSIS — Z951 Presence of aortocoronary bypass graft: Secondary | ICD-10-CM | POA: Insufficient documentation

## 2015-02-24 DIAGNOSIS — H269 Unspecified cataract: Secondary | ICD-10-CM | POA: Insufficient documentation

## 2015-02-24 DIAGNOSIS — S61451A Open bite of right hand, initial encounter: Secondary | ICD-10-CM | POA: Diagnosis not present

## 2015-02-24 HISTORY — DX: Unspecified glaucoma: H40.9

## 2015-02-24 HISTORY — DX: Unspecified cataract: H26.9

## 2015-02-24 HISTORY — DX: Unspecified retinal disorder: H35.9

## 2015-02-24 LAB — BASIC METABOLIC PANEL
ANION GAP: 7 (ref 5–15)
BUN: 28 mg/dL — AB (ref 6–23)
CALCIUM: 9 mg/dL (ref 8.4–10.5)
CO2: 24 mmol/L (ref 19–32)
Chloride: 110 mmol/L (ref 96–112)
Creatinine, Ser: 0.93 mg/dL (ref 0.50–1.35)
GFR calc Af Amer: >90 mL/min (ref >90)
GFR calc non Af Amer: 83 mL/min — ABNORMAL LOW (ref >90)
GLUCOSE: 139 mg/dL — AB (ref 70–99)
Potassium: 3.5 mmol/L (ref 3.5–5.1)
Sodium: 141 mmol/L (ref 135–145)

## 2015-02-24 LAB — CBC WITH DIFFERENTIAL/PLATELET
Basophils Absolute: 0 10*3/uL (ref 0.0–0.1)
Basophils Relative: 1 % (ref 0–1)
Eosinophils Absolute: 0.1 10*3/uL (ref 0.0–0.7)
Eosinophils Relative: 3 % (ref 0–5)
HCT: 39.3 % (ref 39.0–52.0)
Hemoglobin: 12.4 g/dL — ABNORMAL LOW (ref 13.0–17.0)
Lymphocytes Relative: 26 % (ref 12–46)
Lymphs Abs: 1.1 10*3/uL (ref 0.7–4.0)
MCH: 29.5 pg (ref 26.0–34.0)
MCHC: 31.6 g/dL (ref 30.0–36.0)
MCV: 93.3 fL (ref 78.0–100.0)
MONO ABS: 0.5 10*3/uL (ref 0.1–1.0)
MONOS PCT: 11 % (ref 3–12)
NEUTROS PCT: 59 % (ref 43–77)
Neutro Abs: 2.6 10*3/uL (ref 1.7–7.7)
Platelets: 151 10*3/uL (ref 150–400)
RBC: 4.21 MIL/uL — ABNORMAL LOW (ref 4.22–5.81)
RDW: 15.8 % — AB (ref 11.5–15.5)
WBC: 4.4 10*3/uL (ref 4.0–10.5)

## 2015-02-24 MED ORDER — SODIUM CHLORIDE 0.9 % IV SOLN
3.0000 g | Freq: Once | INTRAVENOUS | Status: AC
  Administered 2015-02-24: 3 g via INTRAVENOUS
  Filled 2015-02-24: qty 3

## 2015-02-24 MED ORDER — TETANUS-DIPHTH-ACELL PERTUSSIS 5-2.5-18.5 LF-MCG/0.5 IM SUSP
0.5000 mL | Freq: Once | INTRAMUSCULAR | Status: AC
  Administered 2015-02-24: 0.5 mL via INTRAMUSCULAR
  Filled 2015-02-24: qty 0.5

## 2015-02-24 MED ORDER — AMOXICILLIN-POT CLAVULANATE 875-125 MG PO TABS: 1.0000 | ORAL_TABLET | Freq: Two times a day (BID) | ORAL | Status: DC

## 2015-02-24 NOTE — ED Notes (Signed)
Questions, concerns denied r/t dc, pt A&Ox4, ambulatory

## 2015-02-24 NOTE — ED Notes (Signed)
Bite outlined right hand, education provided

## 2015-02-24 NOTE — ED Notes (Signed)
Pt came in to the ED today as a result of cat bite to right hand. He reports hand being sore and appears to be slightly swollen pink in color

## 2015-02-24 NOTE — ED Provider Notes (Signed)
CSN: [id number]     Arrival date & time 02/24/15  0744 History   First MD Initiated Contact with Patient 02/24/15 [phone]     Chief Complaint  Patient presents with  . Animal Bite    right hand     (Consider location/radiation/quality/duration/timing/severity/associated sxs/prior Treatment) HPI Comments: Patient presents to the ER for evaluation of Bite to his right hand. Patient reports that his Yesterday. He is up-to-date with vaccinations. Patient reports that today the area is swollen, painful and tender to the touch. No drainage. He has not had a fever.  Patient is a 69 y.o. male presenting with animal bite.  Animal Bite   Past Medical History  Diagnosis Date  . Hypertension   . Coronary heart disease   . Ulcerative colitis   . Chronic cough   . Obesity   . Asthma     PFTs 06/15/05 FEV1 85% predicted ratio 68% and truncation of resp loop in a sawtooth pattern. HFA 25% 08-01-2009 >50% Nov 01, 2009>50% 01-31-2010  . 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 Retina disorder 2014  . Glaucoma   . Cataract    Past Surgical History  Procedure Laterality Date  . Coronary artery bypass graft      x6   Family History  Problem Relation Age of Onset  . Heart disease Father   . Hypertension Mother    History  Substance Use Topics  . Smoking status: Never Smoker   . Smokeless tobacco: Never Used  . Alcohol Use: No    Review of Systems  Skin: Positive for wound.  All other systems reviewed and are negative.     Allergies  Piroxicam  Home Medications   Prior to Admission medications   Medication Sig Start Date End Date Taking? Authorizing Provider  albuterol (PROAIR HFA) 108 (90 BASE) MCG/ACT inhaler Inhale 2 puffs into the lungs every 4 (four) hours as needed for wheezing or shortness of breath.  09/06/12  Yes [name] S Parrett, NP  atorvastatin (LIPITOR) 40 MG tablet Take 40 mg by mouth daily.     Yes Historical Provider, MD  chlorpheniramine  (CHLOR-TRIMETON) 4 MG tablet Take 4 mg by mouth 2 (two) times daily as needed for allergies.   Yes Historical Provider, MD  cholecalciferol (VITAMIN D) 1000 UNITS tablet Take 1,000 Units by mouth daily.   Yes Historical Provider, MD  Dextromethorphan-Guaifenesin (MUCINEX DM MAXIMUM STRENGTH) 60-1200 MG per 12 hr tablet Take 1 tablet by mouth every 12 (twelve) hours as needed (for cough or thick mucus).    Yes Historical Provider, MD  digoxin (LANOXIN) 0.125 MG tablet Take 125 mcg by mouth daily.     Yes Historical Provider, MD  diltiazem (DILACOR XR) 180 MG 24 hr capsule Take 180 mg by mouth daily.     Yes Historical Provider, MD  docusate sodium (STOOL SOFTENER) 100 MG capsule Take 100 mg by mouth daily as needed for mild constipation (+/- dulcolax).   Yes Historical Provider, MD  ezetimibe (ZETIA) 10 MG tablet Take 10 mg by mouth daily.     Yes Historical Provider, MD  famotidine (PEPCID) 20 MG tablet Take 20 mg by mouth at bedtime.     Yes Historical Provider, MD  fluticasone (FLONASE) 50 MCG/ACT nasal spray Place 1 spray into both nostrils 2 (two) times daily.   Yes Historical Provider, MD  folic acid (FOLVITE) 1 MG tablet Take 1 mg by mouth daily.     Yes  Historical Provider, MD  ketotifen (ZADITOR) 0.025 % ophthalmic solution Place 1 drop into both eyes daily.    Yes Historical Provider, MD  latanoprost (XALATAN) 0.005 % ophthalmic solution Place 1 drop into both eyes at bedtime.   Yes Historical Provider, MD  loratadine (CLARITIN) 10 MG tablet Take 10 mg by mouth at bedtime as needed for allergies.   Yes Historical Provider, MD  metoprolol tartrate (LOPRESSOR) 25 MG tablet Take 1/2 tablet in AM and 1/2 tablet in PM   Yes Historical Provider, MD  Multiple Vitamins-Minerals (CENTRUM SILVER PO) Take 1 tablet by mouth daily.   Yes Historical Provider, MD  omeprazole (PRILOSEC) 20 MG capsule Take 20 mg by mouth daily before breakfast.    Yes Historical Provider, MD  Pseudoephedrine-Ibuprofen (ADVIL  COLD/SINUS) 30-200 MG TABS Take 1 tablet by mouth as needed (sinus).    Yes Historical Provider, MD  Spacer/Aero-Holding Chambers (AEROCHAMBER MV) inhaler Use as instructed 09/18/13  Yes [name] S Parrett, NP  sulfaSALAzine (AZULFIDINE) 500 MG tablet Take 1,000 mg by mouth 4 (four) times daily.    Yes Historical Provider, MD  SYMBICORT 160-4.5 MCG/ACT inhaler INHALE 2 PUFFS EVERY MORNING AND THEN ANOTHER 2 PUFFS ABOUT 12 HRS LATER 07/28/14  Yes Tanda Rockers, MD  traMADol (ULTRAM) 50 MG tablet 1-2 every 4 hours as needed for severe cough 03/26/14  Yes Tanda Rockers, MD   BP 133/65 mmHg  Pulse 85  Temp(Src) 97.7 F (36.5 C) (Oral)  Resp 18  Ht 5\' 3"  (1.6 m)  Wt 233 lb (105.688 kg)  BMI 41.28 kg/m2  SpO2 93% Physical Exam  Constitutional: He is oriented to person, place, and time. He appears well-developed and well-nourished. No distress.  HENT:  Head: Normocephalic and atraumatic.  Right Ear: Hearing normal.  Left Ear: Hearing normal.  Nose: Nose normal.  Mouth/Throat: Oropharynx is clear and moist and mucous membranes are normal.  Eyes: Conjunctivae and EOM are normal. Pupils are equal, round, and reactive to light.  Neck: Normal range of motion. Neck supple.  Cardiovascular: Regular rhythm, S1 normal and S2 normal.  Exam reveals no gallop and no friction rub.   No murmur heard. Pulmonary/Chest: Effort normal and breath sounds normal. No respiratory distress. He exhibits no tenderness.  Abdominal: Soft. Normal appearance and bowel sounds are normal. There is no hepatosplenomegaly. There is no tenderness. There is no rebound, no guarding, no tenderness at McBurney's point and negative Murphy's sign. No hernia.  Musculoskeletal: Normal range of motion.  Neurological: He is alert and oriented to person, place, and time. He has normal strength. No cranial nerve deficit or sensory deficit. Coordination normal. GCS eye subscore is 4. GCS verbal subscore is 5. GCS motor subscore is 6.  Skin:  Skin is warm, dry and intact. No rash noted. No cyanosis.  Psychiatric: He has a normal mood and affect. His speech is normal and behavior is normal. Thought content normal.  Nursing note and vitals reviewed.   ED Course  Procedures (including critical care time) Labs Review Labs Reviewed  CBC WITH DIFFERENTIAL/PLATELET - Abnormal; Notable for the following:    RBC 4.21 (*)    Hemoglobin 12.4 (*)    RDW 15.8 (*)    All other components within normal limits  BASIC METABOLIC PANEL - Abnormal; Notable for the following:    Glucose, Bld 139 (*)    BUN 28 (*)    GFR calc non Af Amer 83 (*)    All other components within  normal limits    Imaging Review Dg Hand Complete Right  02/24/2015   CLINICAL DATA:  Cat bite to RIGHT hand on 02/23/2015 at 1330 hours, having pain, soreness and pink swelling at the region of the distal fifth metacarpal  EXAM: RIGHT HAND - COMPLETE 3+ VIEW  COMPARISON:  None  FINDINGS: Mild soft tissue swelling at the ulnar border of the RIGHT hand.  Bones demineralized.  Narrowing of second MCP joint.  More advanced degenerative changes at first [facility] joint with mild radial subluxation.  No acute fracture, dislocation or bone destruction.  Minimal cystic changes at the proximal lunate.  Chondrocalcinosis at triangular fibrocartilage complex question CPPD.  IMPRESSION: No acute osseous abnormalities.  Osseous demineralization with scattered degenerative changes and question CPPD as above   Electronically Signed   By: Lavonia Dana M.D.   On: 02/24/2015 08:51     EKG Interpretation None      MDM   Final diagnoses:  Cat bite, initial encounter  Cellulitis of right upper extremity    Patient with Bite that occurred yesterday now presents with early cellulitis of the palmar aspect of the right hand. There is minimal erythema, swelling and tenderness without any fluctuance or purulent drainage. There does not appear to be treatable abscess present. Lab work is normal. X-ray  is unremarkable. Patient is not a diabetic. Patient administered Unasyn 3 g IV and will be prescribed Augmentin as an outpatient.  Briefly with Herbie Baltimore, [place] for Dr. Fredna Dow. Patient will be seen in the office tomorrow for a recheck. Patient counseled return to the ER immediately anytime if there is any significant worsening.  Unfortunately, the [facility] program is not functioning, I was unable to put a photo of the patient's hand in the chart today.    Orpah Greek, MD 02/24/15 1027

## 2015-02-24 NOTE — Discharge Instructions (Signed)
Animal Bite Animal bite wounds can get infected. It is important to get proper medical treatment. Ask your doctor if you need a rabies shot. HOME CARE   Follow your doctor's instructions for taking care of your wound.  Only take medicine as told by your doctor.  Take your medicine (antibiotics) as told. Finish them even if you start to feel better.  Keep all doctor visits as told. You may need a tetanus shot if:   You cannot remember when you had your last tetanus shot.  You have never had a tetanus shot.  The injury broke your skin. If you need a tetanus shot and you choose not to have one, you may get tetanus. Sickness from tetanus can be serious. GET HELP RIGHT AWAY IF:   Your wound is warm, red, sore, or puffy (swollen).  You notice yellowish-white fluid (pus) or a bad smell coming from the wound.  You see a red line on the skin coming from the wound.  You have a fever, chills, or you feel sick.  You feel sick to your stomach (nauseous), or you throw up (vomit).  Your pain does not go away, or it gets worse.  You have trouble moving the injured part.  You have questions or concerns. MAKE SURE YOU:   Understand these instructions.  Will watch your condition.  Will get help right away if you are not doing well or get worse. Document Released: 12/03/2005 Document Revised: 02/25/2012 Document Reviewed: 07/25/2011 [facility] Patient Information 2015 [place], [place]. This information is not intended to replace advice given to you by your health care provider. Make sure you discuss any questions you have with your health care provider.  Cellulitis Cellulitis is an infection of the skin and the tissue under the skin. The infected area is usually red and tender. This happens most often in the arms and lower legs. HOME CARE   Take your antibiotic medicine as told. Finish the medicine even if you start to feel better.  Keep the infected arm or leg raised (elevated).  Put  a warm cloth on the area up to 4 times per day.  Only take medicines as told by your doctor.  Keep all doctor visits as told. GET HELP IF:  You see red streaks on the skin coming from the infected area.  Your red area gets bigger or turns a dark color.  Your bone or joint under the infected area is painful after the skin heals.  Your infection comes back in the same area or different area.  You have a puffy (swollen) bump in the infected area.  You have new symptoms.  You have a fever. GET HELP RIGHT AWAY IF:   You feel very sleepy.  You throw up (vomit) or have watery poop (diarrhea).  You feel sick and have muscle aches and pains. MAKE SURE YOU:   Understand these instructions.  Will watch your condition.  Will get help right away if you are not doing well or get worse. Document Released: 05/21/2008 Document Revised: 04/19/2014 Document Reviewed: 02/18/2012 [facility] Patient Information 2015 [place], [place]. This information is not intended to replace advice given to you by your health care provider. Make sure you discuss any questions you have with your health care provider.

## 2015-02-24 NOTE — ED Notes (Signed)
Pt aware appt to be scheduled for tomorrow with hand specialist, pt friend contacted office while in room. Office is to f/u with appt time

## 2015-02-25 DIAGNOSIS — S61451A Open bite of right hand, initial encounter: Secondary | ICD-10-CM | POA: Diagnosis not present

## 2015-02-28 DIAGNOSIS — S61451A Open bite of right hand, initial encounter: Secondary | ICD-10-CM | POA: Diagnosis not present

## 2015-03-08 DIAGNOSIS — Z79891 Long term (current) use of opiate analgesic: Secondary | ICD-10-CM | POA: Diagnosis not present

## 2015-03-08 DIAGNOSIS — M47812 Spondylosis without myelopathy or radiculopathy, cervical region: Secondary | ICD-10-CM | POA: Diagnosis not present

## 2015-03-08 DIAGNOSIS — M179 Osteoarthritis of knee, unspecified: Secondary | ICD-10-CM | POA: Diagnosis not present

## 2015-03-08 DIAGNOSIS — G894 Chronic pain syndrome: Secondary | ICD-10-CM | POA: Diagnosis not present

## 2015-03-30 ENCOUNTER — Ambulatory Visit (INDEPENDENT_AMBULATORY_CARE_PROVIDER_SITE_OTHER): Payer: Medicare Other | Admitting: Ophthalmology

## 2015-03-30 DIAGNOSIS — H3531 Nonexudative age-related macular degeneration: Secondary | ICD-10-CM

## 2015-03-30 DIAGNOSIS — H35033 Hypertensive retinopathy, bilateral: Secondary | ICD-10-CM | POA: Diagnosis not present

## 2015-03-30 DIAGNOSIS — H2513 Age-related nuclear cataract, bilateral: Secondary | ICD-10-CM

## 2015-03-30 DIAGNOSIS — I10 Essential (primary) hypertension: Secondary | ICD-10-CM | POA: Diagnosis not present

## 2015-03-30 DIAGNOSIS — H35371 Puckering of macula, right eye: Secondary | ICD-10-CM

## 2015-03-30 DIAGNOSIS — H43813 Vitreous degeneration, bilateral: Secondary | ICD-10-CM | POA: Diagnosis not present

## 2015-04-12 DIAGNOSIS — H402232 Chronic angle-closure glaucoma, bilateral, moderate stage: Secondary | ICD-10-CM | POA: Diagnosis not present

## 2015-04-12 DIAGNOSIS — H35372 Puckering of macula, left eye: Secondary | ICD-10-CM | POA: Diagnosis not present

## 2015-04-12 DIAGNOSIS — H2513 Age-related nuclear cataract, bilateral: Secondary | ICD-10-CM | POA: Diagnosis not present

## 2015-04-12 DIAGNOSIS — H35033 Hypertensive retinopathy, bilateral: Secondary | ICD-10-CM | POA: Diagnosis not present

## 2015-05-12 ENCOUNTER — Encounter: Payer: Self-pay | Admitting: Adult Health

## 2015-05-12 ENCOUNTER — Ambulatory Visit (INDEPENDENT_AMBULATORY_CARE_PROVIDER_SITE_OTHER): Payer: Medicare Other | Admitting: Adult Health

## 2015-05-12 VITALS — BP 124/82 | HR 75 | Temp 98.2°F | Ht 63.0 in | Wt 233.2 lb

## 2015-05-12 DIAGNOSIS — J4541 Moderate persistent asthma with (acute) exacerbation: Secondary | ICD-10-CM

## 2015-05-12 DIAGNOSIS — J0191 Acute recurrent sinusitis, unspecified: Secondary | ICD-10-CM | POA: Diagnosis not present

## 2015-05-12 MED ORDER — AMOXICILLIN-POT CLAVULANATE 875-125 MG PO TABS: 1.0000 | ORAL_TABLET | Freq: Two times a day (BID) | ORAL | Status: AC

## 2015-05-12 MED ORDER — PREDNISONE 10 MG PO TABS: ORAL_TABLET | ORAL | Status: DC

## 2015-05-12 MED ORDER — ALBUTEROL SULFATE HFA 108 (90 BASE) MCG/ACT IN AERS: 2.0000 | INHALATION_SPRAY | RESPIRATORY_TRACT | Status: DC | PRN

## 2015-05-12 NOTE — Assessment & Plan Note (Signed)
Mild flare with sinusitis  Patient's medications were reviewed today and patient education was given. Computerized medication calendar was adjusted/completed   Plan  Augmentin  875mg  Twice daily  For 10 days -take with food.  Mucinex DM Twice daily  As needed  Cough/congestion  Prednisone taper over next week.  Follow med calendar closely and bring to each visit.  Follow with up 3 months and As needed   Please contact office for sooner follow up if symptoms do not improve or worsen or seek emergency care

## 2015-05-12 NOTE — Progress Notes (Signed)
Chart/ progress notes reviewed / agree with a/p as  Outlined

## 2015-05-12 NOTE — Progress Notes (Signed)
Subjective:    Patient ID: Barry [name], male    DOB: Feb 05, 1946 .   MRN: [id number]   Brief patient profile:  69 yowm never smoker with relatively mild asthma but superimposed on obesity plus a chronically paralyzed left hemidiaphragm and a tendency to upper airway cough syndrome.    History of Present Illness  03/24/12 [name] ov for sinusitis > rx levaquin > sinus CT > Negative paranasal sinuses.     03/08/2014 f/u ov/[name] re: flare x one week off tramadol  Chief Complaint  Patient presents with  . Acute Visit    Pt c/o increased SOB, wheezing, sinus pressure, and prod cough with lare amounts of green sputum x 4 days.   using rescue 3 -4 x since onset, none at all on day of ov  rec Augmentin 875 mg take one pill twice daily  X 10 days - take at breakfast and supper with large glass of water.   If not improving > Prednisone 10 mg take  4 each am x 2 days,   2 each am x 2 days,  1 each am x 2  and stop    03/26/2014 f/u ov/[name] re: ab flare ? Sinus dz Chief Complaint  Patient presents with  . Follow-up    Pt c/o increased chest congestion for the past 3 days- worse the past day. Breathing is no better since last visit. He also c/o prod cough with minimal thick, tan to green sputum. For the past wk using rescue inhaler 1 to 2 times per day  was better p last ov, then gradually x 3 days  Worse, never took prednisone.  Last saba one day prior to OV   Augmentin 875 mg take one pill twice daily  X 21days - take at breakfast and supper with large glass of water.  It would help reduce the usual side effects (diarrhea and yeast infections) if you ate cultured yogurt at lunch.  Prednisone 10 mg take  4 each am x 2 days,   2 each am x 2 days,  1 each am x 2 days and stop     06/24/2014 f/u ov/[name] re:  ? aeAB Chief Complaint  Patient presents with  . Follow-up    Pt c/o increased cough for the past wk- prod with large amounts of tan to green sputum.  He also has noticed slight increase in  SOB and has used proair x 1 in the past wk.      Onset was abrupt "like a head cold" - no fever, some nasal congestion also / no better on 1st gen H1  rec Augmentin 875 mg take one pill twice daily  X 10 days  Prednisone 10 mg take  4 each am x 2 days,   2 each am x 2 days,  1 each am x 2 days and stop     09/24/2014 f/u ov/[name] re: asthma/ poor hfa even with spacer  On symbicort 160 2 bid Chief Complaint  Patient presents with  . Follow-up  improved p last ov with flare  - more limited by legs than breathing at baseline, only needs saba maybe once or twice daily  rec Work on inhaler technique:  relax and gently blow all the way out then take a nice smooth deep breath back in, triggering the inhaler at same time you start breathing in.  Hold for up to 5 seconds if you can.  Rinse and gargle with water when done Please  schedule a follow up visit in 3 months but call sooner if needed - bring inhaler with spacer with you    02/11/2015 f/u ov/[name] re: asthma/ did not bring spacer as requested Chief Complaint  Patient presents with  . Follow-up    Pt states that his breathing is doing well- rarely uses SABA. He c/o sinus pressure and dry cough for the past wk.     Not limited by breathing from desired activities   >>no changes   05/12/2015 Follow up : Asthma /Chronic Rhinitis  Pt returns for 3 month follow up  We reviewed all her meds and organized them into a med calendar with pt education Appears to be taking correctly.  Does complain of  sinus congestion, PND, occasional prod cough with thick light yellow mucus x3 days.  denies any f/c/s, n/v/d, hemoptysis Taking his cough and sinus regimen with mucinex, saline, afrin and snus decongestants without much help.  Sinus pain and pressure worse for 3 days.  More cough and little wheezing on/off .     Current Medications, Allergies, Complete Past Medical History, Past Surgical History, Family History, and Social History were reviewed in  [employer] record.  ROS  The following are not active complaints unless bolded sore throat, dysphagia, dental problems, itching, sneezing,  nasal congestion or excess/ purulent secretions, ear ache,   fever, chills, sweats, unintended wt loss, pleuritic or exertional cp, hemoptysis,  orthopnea pnd or leg swelling, presyncope, palpitations, heartburn, abdominal pain, anorexia, nausea, vomiting, diarrhea  or change in bowel or urinary habits, change in stools or urine, dysuria,hematuria,  rash, arthralgias, visual complaints, headache, numbness weakness or ataxia or problems with walking or coordination,  change in mood/affect or memory.                      Past Medical History:   HYPERTENSION (ICD-V17.4)  CORONARY HEART DISEASE (ICD-V17.3)  ULCERATIVE COLITIS (ICD-556.9)  COUGH, CHRONIC (ICD-786.2)  OBESITY (ICD-278.00)    - Target wt < 191 to get under BMI 30 ASTHMA (ICD-493.90)  - PFTs 06/15/2005 FEV1 85% predicted ratio 68% and truncation of respiratory loop in a sawtooth pattern  - HFA 25% August 01, 2009 > 50% November 01, 2009 > 50% January 31, 2010  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  Health Maintenance..............................................................[name]  - Pneumovax age 69 November 01, 2009 (second shot) [name] 12/2013  -med calendar 09/11/2012 , 01/05/14 , 05/12/2015   Family History:  Coronary Heart Disease, father  Hypertension, mother   Social History:  Patient never smoked.  no alcohol  single  no children  disabled: textiles         Objective:   Physical Exam GEN: A/Ox3; pleasant , NAD, obese  Chronic abn gait  Wt  233 06/06/11  >> 233 09/18/2013 > 11/05/2013 226 >231 01/06/2014 > 238 03/08/2014 > 03/26/2014 239 > 06/24/2014 238 >  02/11/15 233 >233 05/12/2015   HEENT:  [place]/AT,  EACs-wax   NOSE-clear drainage,  + tender max sinus  THROAT-clear, no lesions, no postnasal drip or exudate noted. Class 3 airway  NECK:   Supple w/ fair ROM; no JVD; normal carotid impulses w/o bruits; no thyromegaly or nodules palpated; no lymphadenopathy.  RESP clear to  A and P   CARD:  RRR, no m/r/g  , no peripheral edema, pulses intact, no cyanosis or clubbing.  GI:   Soft & nt; nml bowel sounds; no organomegaly or masses detected.  Musco: Warm bil,  walks with cane           Assessment & Plan:

## 2015-05-12 NOTE — Patient Instructions (Signed)
Augmentin  875mg  Twice daily  For 10 days -take with food.  Mucinex DM Twice daily  As needed  Cough/congestion  Prednisone taper over next week.  Follow med calendar closely and bring to each visit.  Follow with up 3 months and As needed   Please contact office for sooner follow up if symptoms do not improve or worsen or seek emergency care

## 2015-05-12 NOTE — Assessment & Plan Note (Signed)
Flare   Plan  Augmentin  875mg  Twice daily  For 10 days -take with food.  Mucinex DM Twice daily  As needed  Cough/congestion  Prednisone taper over next week.  Follow med calendar closely and bring to each visit.  Follow with up 3 months and As needed   Please contact office for sooner follow up if symptoms do not improve or worsen or seek emergency care

## 2015-05-24 NOTE — Addendum Note (Signed)
Addended by: Parke Poisson E on: 05/24/2015 01:54 PM   Modules accepted: Orders, Medications

## 2015-06-08 DIAGNOSIS — H2513 Age-related nuclear cataract, bilateral: Secondary | ICD-10-CM | POA: Diagnosis not present

## 2015-06-08 DIAGNOSIS — H35341 Macular cyst, hole, or pseudohole, right eye: Secondary | ICD-10-CM | POA: Diagnosis not present

## 2015-06-08 DIAGNOSIS — H25013 Cortical age-related cataract, bilateral: Secondary | ICD-10-CM | POA: Diagnosis not present

## 2015-06-08 DIAGNOSIS — H35372 Puckering of macula, left eye: Secondary | ICD-10-CM | POA: Diagnosis not present

## 2015-06-08 DIAGNOSIS — H402232 Chronic angle-closure glaucoma, bilateral, moderate stage: Secondary | ICD-10-CM | POA: Diagnosis not present

## 2015-06-08 DIAGNOSIS — H04123 Dry eye syndrome of bilateral lacrimal glands: Secondary | ICD-10-CM | POA: Diagnosis not present

## 2015-06-08 DIAGNOSIS — H47021 Hemorrhage in optic nerve sheath, right eye: Secondary | ICD-10-CM | POA: Diagnosis not present

## 2015-06-21 DIAGNOSIS — E668 Other obesity: Secondary | ICD-10-CM | POA: Diagnosis not present

## 2015-06-21 DIAGNOSIS — I2510 Atherosclerotic heart disease of native coronary artery without angina pectoris: Secondary | ICD-10-CM | POA: Diagnosis not present

## 2015-06-21 DIAGNOSIS — I10 Essential (primary) hypertension: Secondary | ICD-10-CM | POA: Diagnosis not present

## 2015-06-21 DIAGNOSIS — Z01810 Encounter for preprocedural cardiovascular examination: Secondary | ICD-10-CM | POA: Diagnosis not present

## 2015-06-21 DIAGNOSIS — I252 Old myocardial infarction: Secondary | ICD-10-CM | POA: Diagnosis not present

## 2015-06-21 DIAGNOSIS — E785 Hyperlipidemia, unspecified: Secondary | ICD-10-CM | POA: Diagnosis not present

## 2015-06-22 DIAGNOSIS — I2510 Atherosclerotic heart disease of native coronary artery without angina pectoris: Secondary | ICD-10-CM | POA: Diagnosis not present

## 2015-06-22 DIAGNOSIS — E780 Pure hypercholesterolemia: Secondary | ICD-10-CM | POA: Diagnosis not present

## 2015-06-22 DIAGNOSIS — M1990 Unspecified osteoarthritis, unspecified site: Secondary | ICD-10-CM | POA: Diagnosis not present

## 2015-06-22 DIAGNOSIS — Z0001 Encounter for general adult medical examination with abnormal findings: Secondary | ICD-10-CM | POA: Diagnosis not present

## 2015-06-22 DIAGNOSIS — J45909 Unspecified asthma, uncomplicated: Secondary | ICD-10-CM | POA: Diagnosis not present

## 2015-06-22 DIAGNOSIS — K5100 Ulcerative (chronic) pancolitis without complications: Secondary | ICD-10-CM | POA: Diagnosis not present

## 2015-06-22 DIAGNOSIS — Z86010 Personal history of colonic polyps: Secondary | ICD-10-CM | POA: Diagnosis not present

## 2015-07-12 DIAGNOSIS — H2511 Age-related nuclear cataract, right eye: Secondary | ICD-10-CM | POA: Diagnosis not present

## 2015-08-12 ENCOUNTER — Ambulatory Visit: Payer: Medicare Other | Admitting: Adult Health

## 2015-08-12 DIAGNOSIS — H25012 Cortical age-related cataract, left eye: Secondary | ICD-10-CM | POA: Diagnosis not present

## 2015-08-12 DIAGNOSIS — H2512 Age-related nuclear cataract, left eye: Secondary | ICD-10-CM | POA: Diagnosis not present

## 2015-08-19 ENCOUNTER — Ambulatory Visit (INDEPENDENT_AMBULATORY_CARE_PROVIDER_SITE_OTHER): Payer: Medicare Other | Admitting: Adult Health

## 2015-08-19 ENCOUNTER — Encounter: Payer: Self-pay | Admitting: Adult Health

## 2015-08-19 ENCOUNTER — Ambulatory Visit (INDEPENDENT_AMBULATORY_CARE_PROVIDER_SITE_OTHER)
Admission: RE | Admit: 2015-08-19 | Discharge: 2015-08-19 | Disposition: A | Discharge status: Home / Self Care | Payer: Medicare Other | Source: Ambulatory Visit | Attending: Adult Health | Admitting: Adult Health

## 2015-08-19 VITALS — BP 114/82 | HR 73 | Temp 98.5°F | Ht 63.0 in | Wt 237.0 lb

## 2015-08-19 DIAGNOSIS — R0602 Shortness of breath: Secondary | ICD-10-CM | POA: Diagnosis not present

## 2015-08-19 DIAGNOSIS — R05 Cough: Secondary | ICD-10-CM | POA: Diagnosis not present

## 2015-08-19 DIAGNOSIS — J4541 Moderate persistent asthma with (acute) exacerbation: Secondary | ICD-10-CM | POA: Diagnosis not present

## 2015-08-19 DIAGNOSIS — J45909 Unspecified asthma, uncomplicated: Secondary | ICD-10-CM | POA: Diagnosis not present

## 2015-08-19 MED ORDER — CEFDINIR 300 MG PO CAPS: 300.0000 mg | ORAL_CAPSULE | Freq: Two times a day (BID) | ORAL | Status: DC

## 2015-08-19 MED ORDER — PREDNISONE 10 MG PO TABS: ORAL_TABLET | ORAL | Status: DC

## 2015-08-19 NOTE — Patient Instructions (Signed)
Omnicef 300mg  Twice daily  For 1 week  Prednisone taper over next week.  Eat yogurt while on antibiotics .  Mucinex DM Twice daily  As needed  Cough/congestion  Follow with up 3 months with Dr. Melvyn Novas  and As needed   Please contact office for sooner follow up if symptoms do not improve or worsen or seek emergency care

## 2015-08-19 NOTE — Progress Notes (Signed)
Subjective:    Patient ID: Barry [name], male    DOB: 1946/12/09 .   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    History of Present Illness  03/24/12 Barry [name] ov for sinusitis > rx levaquin > sinus CT > Negative paranasal sinuses.     03/08/2014 f/u ov/Barry [name] re: flare x one week off tramadol  Chief Complaint  Patient presents with  . Acute Visit    Pt c/o increased SOB, wheezing, sinus pressure, and prod cough with lare amounts of green sputum x 4 days.   using rescue 3 -4 x since onset, none at all on day of ov  rec Augmentin 875 mg take one pill twice daily  X 10 days - take at breakfast and supper with large glass of water.   If not improving > Prednisone 10 mg take  4 each am x 2 days,   2 each am x 2 days,  1 each am x 2  and stop    03/26/2014 f/u ov/Barry [name] re: ab flare ? Sinus dz Chief Complaint  Patient presents with  . Follow-up    Pt c/o increased chest congestion for the past 3 days- worse the past day. Breathing is no better since last visit. He also c/o prod cough with minimal thick, tan to green sputum. For the past wk using rescue inhaler 1 to 2 times per day  was better p last ov, then gradually x 3 days  Worse, never took prednisone.  Last saba one day prior to OV   Augmentin 875 mg take one pill twice daily  X 21days - take at breakfast and supper with large glass of water.  It would help reduce the usual side effects (diarrhea and yeast infections) if you ate cultured yogurt at lunch.  Prednisone 10 mg take  4 each am x 2 days,   2 each am x 2 days,  1 each am x 2 days and stop     06/24/2014 f/u ov/Barry [name] re:  ? aeAB Chief Complaint  Patient presents with  . Follow-up    Pt c/o increased cough for the past wk- prod with large amounts of tan to green sputum.  He also has noticed slight increase in  SOB and has used proair x 1 in the past wk.      Onset was abrupt "like a head cold" - no fever, some nasal congestion also / no better on 1st gen H1  rec Augmentin 875 mg take one pill twice daily  X 10 days  Prednisone 10 mg take  4 each am x 2 days,   2 each am x 2 days,  1 each am x 2 days and stop     09/24/2014 f/u ov/Barry [name] re: asthma/ poor hfa even with spacer  On symbicort 160 2 bid Chief Complaint  Patient presents with  . Follow-up  improved p last ov with flare  - more limited by legs than breathing at baseline, only needs saba maybe once or twice daily  rec Work on inhaler technique:  relax and gently blow all the way out then take a nice smooth deep breath back in, triggering the inhaler at same time you start breathing in.  Hold for up to 5 seconds if you can.  Rinse and gargle with water when done Please  schedule a follow up visit in 3 months but call sooner if needed - bring inhaler with spacer with you    02/11/2015 f/u ov/Barry [name] re: asthma/ did not bring spacer as requested Chief Complaint  Patient presents with  . Follow-up    Pt states that his breathing is doing well- rarely uses SABA. He c/o sinus pressure and dry cough for the past wk.     Not limited by breathing from desired activities   >>no changes   05/12/2015 Follow up : Asthma /Chronic Rhinitis  Pt returns for 3 month follow up  We reviewed all her meds and organized them into a med calendar with pt education Appears to be taking correctly.  Does complain of  sinus congestion, PND, occasional prod cough with thick light yellow mucus x3 days.  denies any f/c/s, n/v/d, hemoptysis Taking his cough and sinus regimen with mucinex, saline, afrin and snus decongestants without much help.  Sinus pain and pressure worse for 3 days.  More cough and little wheezing on/off .  >augmentin /pred taper   08/19/2015 Follow up : Asthma /Chronic rhinitis  Patient returns for a three-month follow-up.  Pt c/o prod cough with  "tan" colored mcucs, chest and sinus congestion. Increased SOB and wheezing with activity. Denies fever, nausea or vomitting.  Patient was seen 3 months ago with similar symptoms. Patient says that he did improve after he took his antibiotics but over the last week. His notices symptoms are coming back with a productive cough.. Chest x-ray today shows no acute process. He denies any hemoptysis, chest pain, orthopnea, PND, or increased leg swelling.    Current Medications, Allergies, Complete Past Medical History, Past Surgical History, Family History, and Social History were reviewed in [employer] record.  ROS  The following are not active complaints unless bolded sore throat, dysphagia, dental problems, itching, sneezing,  nasal congestion or excess/ purulent secretions, ear ache,   fever, chills, sweats, unintended wt loss, pleuritic or exertional cp, hemoptysis,  orthopnea pnd or leg swelling, presyncope, palpitations, heartburn, abdominal pain, anorexia, nausea, vomiting, diarrhea  or change in bowel or urinary habits, change in stools or urine, dysuria,hematuria,  rash, arthralgias, visual complaints, headache, numbness weakness or ataxia or problems with walking or coordination,  change in mood/affect or memory.                      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  no alcohol  single  no children  disabled: textiles         Objective:   Physical Exam GEN: A/Ox3; pleasant , NAD, obese  Chronic abn gait  Wt  233 06/06/11  >> 233 09/18/2013 > 11/05/2013 226 >231 01/06/2014 > 238 03/08/2014 > 03/26/2014 239 > 06/24/2014 238 >  02/11/15  233 >233 05/12/2015 >237 08/19/2015   HEENT:  [place]/AT,  EACs-wax   NOSE-clear drainage,    THROAT-clear, no lesions, no postnasal drip or exudate noted. Class 3 airway  NECK:  Supple w/ fair ROM; no JVD; normal carotid impulses w/o bruits; no thyromegaly or nodules palpated; no lymphadenopathy.  RESP few tr rhonchi   CARD:  RRR, no m/r/g  , no peripheral edema, pulses intact, no cyanosis or clubbing.  GI:   Soft & nt; nml bowel sounds; no organomegaly or masses detected.  Musco: Warm bil, walks with cane           Assessment & Plan:

## 2015-08-19 NOTE — Assessment & Plan Note (Signed)
Exacerbation with bronchitis Chest x-ray reviewed independently  Plan  Omnicef 300mg  Twice daily  For 1 week  Prednisone taper over next week.  Eat yogurt while on antibiotics .  Mucinex DM Twice daily  As needed  Cough/congestion  Follow with up 3 months with Dr. Melvyn Novas  and As needed   Please contact office for sooner follow up if symptoms do not improve or worsen or seek emergency care

## 2015-08-20 NOTE — Progress Notes (Signed)
Chart and office note reviewed in detail  > agree with a/p as outlined    

## 2015-08-23 NOTE — Progress Notes (Signed)
Quick Note:  Called and spoke with pt. Reviewed results and recs. Pt voiced understanding and had no further questions. ______ 

## 2015-08-27 ENCOUNTER — Other Ambulatory Visit: Payer: Self-pay | Admitting: Internal Medicine

## 2015-09-06 DIAGNOSIS — M47812 Spondylosis without myelopathy or radiculopathy, cervical region: Secondary | ICD-10-CM | POA: Diagnosis not present

## 2015-09-06 DIAGNOSIS — G894 Chronic pain syndrome: Secondary | ICD-10-CM | POA: Diagnosis not present

## 2015-09-06 DIAGNOSIS — M179 Osteoarthritis of knee, unspecified: Secondary | ICD-10-CM | POA: Diagnosis not present

## 2015-09-06 DIAGNOSIS — Z79899 Other long term (current) drug therapy: Secondary | ICD-10-CM | POA: Diagnosis not present

## 2015-09-06 DIAGNOSIS — Z79891 Long term (current) use of opiate analgesic: Secondary | ICD-10-CM | POA: Diagnosis not present

## 2015-09-20 ENCOUNTER — Other Ambulatory Visit: Payer: Self-pay | Admitting: Gastroenterology

## 2015-09-20 DIAGNOSIS — H2512 Age-related nuclear cataract, left eye: Secondary | ICD-10-CM | POA: Diagnosis not present

## 2015-10-15 DIAGNOSIS — Z23 Encounter for immunization: Secondary | ICD-10-CM | POA: Diagnosis not present

## 2015-11-16 ENCOUNTER — Encounter: Payer: Self-pay | Admitting: Internal Medicine

## 2015-11-16 ENCOUNTER — Ambulatory Visit (INDEPENDENT_AMBULATORY_CARE_PROVIDER_SITE_OTHER): Payer: Medicare Other | Admitting: Internal Medicine

## 2015-11-16 VITALS — BP 152/88 | HR 82 | Ht 63.0 in | Wt 239.0 lb

## 2015-11-16 DIAGNOSIS — J4531 Mild persistent asthma with (acute) exacerbation: Secondary | ICD-10-CM | POA: Diagnosis not present

## 2015-11-16 MED ORDER — PREDNISONE 10 MG PO TABS: ORAL_TABLET | ORAL | Status: DC

## 2015-11-16 MED ORDER — AMOXICILLIN-POT CLAVULANATE 875-125 MG PO TABS: 1.0000 | ORAL_TABLET | Freq: Two times a day (BID) | ORAL | Status: DC

## 2015-11-16 NOTE — Patient Instructions (Addendum)
Augmentin 875 mg take one pill twice daily  X 10 days - take at breakfast and supper with large glass of water.  It would help reduce the usual side effects (diarrhea and yeast infections) if you ate cultured yogurt at lunch.   Prednisone 10 mg take  4 each am x 2 days,   2 each am x 2 days,  1 each am x 2 days and stop   Remember to use afrin x 5 days before each dose of flonase  Remember to use the flutter valve  Work on inhaler technique:  relax and gently blow all the way out then take a nice smooth deep breath back in, triggering the inhaler at same time you start breathing in.  Hold for up to 5 seconds if you can. Blow out thru nose. Rinse and gargle with water when done     Please schedule a follow up visit in 3 months but call sooner if needed to see [name] NP

## 2015-11-16 NOTE — Progress Notes (Signed)
Subjective:    Patient ID: Barry [name], male    DOB: 1946/12/09 .   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    History of Present Illness  03/24/12 [name] ov for sinusitis > rx levaquin > sinus CT > Negative paranasal sinuses.     03/08/2014 f/u ov/Chella Chapdelaine re: flare x one week off tramadol  Chief Complaint  Patient presents with  . Acute Visit    Pt c/o increased SOB, wheezing, sinus pressure, and prod cough with lare amounts of green sputum x 4 days.   using rescue 3 -4 x since onset, none at all on day of ov  rec Augmentin 875 mg take one pill twice daily  X 10 days - take at breakfast and supper with large glass of water.   If not improving > Prednisone 10 mg take  4 each am x 2 days,   2 each am x 2 days,  1 each am x 2  and stop    03/26/2014 f/u ov/Noreene Boreman re: ab flare ? Sinus dz Chief Complaint  Patient presents with  . Follow-up    Pt c/o increased chest congestion for the past 3 days- worse the past day. Breathing is no better since last visit. He also c/o prod cough with minimal thick, tan to green sputum. For the past wk using rescue inhaler 1 to 2 times per day  was better p last ov, then gradually x 3 days  Worse, never took prednisone.  Last saba one day prior to OV   Augmentin 875 mg take one pill twice daily  X 21days - take at breakfast and supper with large glass of water.  It would help reduce the usual side effects (diarrhea and yeast infections) if you ate cultured yogurt at lunch.  Prednisone 10 mg take  4 each am x 2 days,   2 each am x 2 days,  1 each am x 2 days and stop     06/24/2014 f/u ov/Kayven Aldaco re:  ? aeAB Chief Complaint  Patient presents with  . Follow-up    Pt c/o increased cough for the past wk- prod with large amounts of tan to green sputum.  He also has noticed slight increase in  SOB and has used proair x 1 in the past wk.      Onset was abrupt "like a head cold" - no fever, some nasal congestion also / no better on 1st gen H1  rec Augmentin 875 mg take one pill twice daily  X 10 days  Prednisone 10 mg take  4 each am x 2 days,   2 each am x 2 days,  1 each am x 2 days and stop     09/24/2014 f/u ov/Keimari  re: asthma/ poor hfa even with spacer  On symbicort 160 2 bid Chief Complaint  Patient presents with  . Follow-up  improved p last ov with flare  - more limited by legs than breathing at baseline, only needs saba maybe once or twice daily  rec Work on inhaler technique:  relax and gently blow all the way out then take a nice smooth deep breath back in, triggering the inhaler at same time you start breathing in.  Hold for up to 5 seconds if you can.  Rinse and gargle with water when done Please  schedule a follow up visit in 3 months but call sooner if needed - bring inhaler with spacer with you    02/11/2015 f/u ov/Jerrold Haskell re: asthma/ did not bring spacer as requested Chief Complaint  Patient presents with  . Follow-up    Pt states that his breathing is doing well- rarely uses SABA. He c/o sinus pressure and dry cough for the past wk.     Not limited by breathing from desired activities   >>no changes   05/12/2015 Follow up : Asthma /Chronic Rhinitis  Pt returns for 3 month follow up  We reviewed all her meds and organized them into a med calendar with pt education Appears to be taking correctly.  Does complain of  sinus congestion, PND, occasional prod cough with thick light yellow mucus x3 days.  denies any f/c/s, n/v/d, hemoptysis Taking his cough and sinus regimen with mucinex, saline, afrin and snus decongestants without much help.  Sinus pain and pressure worse for 3 days.  More cough and little wheezing on/off .  >augmentin /pred taper   08/19/2015 NP Follow up : Asthma /Chronic rhinitis  Patient returns for a three-month follow-up.  Pt c/o prod cough  with "tan" colored mcucs, chest and sinus congestion. Increased SOB and wheezing with activity. Denies fever, nausea or vomitting.  Patient was seen 3 months ago with similar symptoms. Patient says that he did improve after he took his antibiotics but over the last week. His notices symptoms are coming back with a productive cough.Daine Gravel 300mg  Twice daily  For 1 week  Prednisone taper over next week.  Eat yogurt while on antibiotics .  Mucinex DM Twice daily  As needed  Cough/congestion     11/16/2015  f/u ov/Corry Ihnen re: acute exac ab   Chief Complaint  Patient presents with  . Follow-up    Pt c/o increased chest congestion, cough with green mucus and some wheeze/SOB x5 days.     Baseline improves quit a bit between visits on symb 160 2bid maint rx /  not needing saba but started needing 3x daily and a hs x 5 days prior to OV  Ins setting of acute cough/ congestion   No obvious day to day or daytime variability or assoc  cp or chest tightness,   or overt   hb symptoms. No unusual exp hx or h/o childhood pna/ asthma or knowledge of premature birth.  Sleeping ok without nocturnal  or early am exacerbation  of respiratory  c/o's or need for noct saba. Also denies any obvious fluctuation of symptoms with weather or environmental changes or other aggravating or alleviating factors except as outlined above   Current Medications, Allergies, Complete Past Medical History, Past Surgical History, Family History, and Social History were reviewed in [employer] record.  ROS  The following are not active complaints unless bolded sore throat, dysphagia, dental problems, itching, sneezing,  nasal congestion or excess/ purulent secretions, ear ache,   fever, chills, sweats, unintended wt loss, classically pleuritic or exertional cp, hemoptysis,  orthopnea pnd or leg swelling, presyncope, palpitations, abdominal pain, anorexia, nausea, vomiting, diarrhea  or change in bowel or  bladder habits, change in stools or urine, dysuria,hematuria,  rash, arthralgias, visual complaints, headache, numbness, weakness or ataxia or problems with walking or coordination,  change in mood/affect or memory.              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  no alcohol  single  no children  disabled: textiles         Objective:   Physical Exam GEN: A/Ox3; pleasant , NAD, obese  Chronic abn gait - 8 h since last saba  Wt  233 06/06/11  >> 233 09/18/2013 > 11/05/2013 226 >231 01/06/2014 > 238 03/08/2014 > 03/26/2014 239 > 06/24/2014 238 >  02/11/15 233 >233 05/12/2015 >237 08/19/2015 > 11/16/2015  239   HEENT:  [place]/AT,  EACs-wax   NOSE-clear drainage,    THROAT-clear, no lesions, no postnasal drip or exudate noted. Class 3 airway  NECK:  Supple w/ fair ROM; no JVD; normal carotid impulses w/o bruits; no thyromegaly or nodules palpated; no lymphadenopathy.  RESP  Scattered mild  insp and exp rhonchi bilaterally   CARD:  RRR, no m/r/g  , no peripheral edema, pulses intact, no cyanosis or clubbing.  GI:   Soft & nt; nml bowel sounds; no organomegaly or masses detected.  Musco: Warm bil, walks with cane               Assessment & Plan:

## 2015-11-17 ENCOUNTER — Encounter: Payer: Self-pay | Admitting: Internal Medicine

## 2015-11-17 NOTE — Assessment & Plan Note (Signed)
Body mass index is 42.35  No results found for: TSH     Contributing to gerd tendency/ doe/reviewed the need and the process to achieve and maintain neg calorie balance > defer f/u primary care including intermittently monitoring thyroid status

## 2015-11-17 NOTE — Assessment & Plan Note (Addendum)
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  - HFA 25% August 01, 2009 > 50% November 01, 2009 > 50% January 31, 2010 therefore changed to neb bud/brovana - HFA 75% p extensive coaching 08/07/2013  > insurance issues so try symbicort 160 2 bid instead of neb   - 11/16/2015   p extensive coaching HFA effectiveness =    75% with  spacer from a baseline of 50%   Acute flare ? Related to sinus dz but also reviewed  the natural history of uri and why it's necessary in patients at risk to treat GERD aggressively - at least  short term -   to reduce risk of evolving cyclical cough initially  triggered by epithelial injury and a heightened sensitivty to the effects of any upper airway irritants,  most importantly acid - related - then perpetuated by epithelial injury related to the cough itself as the upper airway collapses on itself.  That is, the more sensitive the epithelium becomes once it is damaged by the virus, the more the ensuing irritability> the more the cough, the more the secondary reflux (especially in those prone to reflux) the more the irritation of the sensitive mucosa and so on in a  Classic cyclical pattern.    I had an extended discussion with the patient reviewing all relevant studies completed to date and  lasting 15 to 20 minutes of a 25 minute visit    Each maintenance medication was reviewed in detail including most importantly the difference between maintenance and prns and under what circumstances the prns are to be triggered using an action plan format that is not reflected in the computer generated alphabetically organized AVS.    Please see instructions for details which were reviewed in writing and the patient given a copy highlighting the part that I personally wrote and discussed at today's ov.

## 2015-11-21 ENCOUNTER — Encounter (HOSPITAL_COMMUNITY): Payer: Self-pay | Admitting: *Deleted

## 2015-11-29 ENCOUNTER — Ambulatory Visit (HOSPITAL_COMMUNITY): Payer: Medicare Other | Admitting: Anesthesiology

## 2015-11-29 ENCOUNTER — Encounter (HOSPITAL_COMMUNITY): Payer: Self-pay

## 2015-11-29 ENCOUNTER — Encounter (HOSPITAL_COMMUNITY)
Admission: RE | Disposition: A | Discharge status: Home / Self Care | Payer: Self-pay | Source: Ambulatory Visit | Attending: Gastroenterology

## 2015-11-29 ENCOUNTER — Ambulatory Visit (HOSPITAL_COMMUNITY)
Admission: RE | Admit: 2015-11-29 | Discharge: 2015-11-29 | Disposition: A | Discharge status: Home / Self Care | Payer: Medicare Other | Source: Ambulatory Visit | Attending: Gastroenterology | Admitting: Gastroenterology

## 2015-11-29 DIAGNOSIS — Z6841 Body Mass Index (BMI) 40.0 and over, adult: Secondary | ICD-10-CM | POA: Diagnosis not present

## 2015-11-29 DIAGNOSIS — Z1211 Encounter for screening for malignant neoplasm of colon: Secondary | ICD-10-CM | POA: Insufficient documentation

## 2015-11-29 DIAGNOSIS — E7800 Pure hypercholesterolemia, unspecified: Secondary | ICD-10-CM | POA: Insufficient documentation

## 2015-11-29 DIAGNOSIS — K529 Noninfective gastroenteritis and colitis, unspecified: Secondary | ICD-10-CM | POA: Diagnosis not present

## 2015-11-29 DIAGNOSIS — K219 Gastro-esophageal reflux disease without esophagitis: Secondary | ICD-10-CM | POA: Diagnosis not present

## 2015-11-29 DIAGNOSIS — J45909 Unspecified asthma, uncomplicated: Secondary | ICD-10-CM | POA: Diagnosis not present

## 2015-11-29 DIAGNOSIS — I252 Old myocardial infarction: Secondary | ICD-10-CM | POA: Insufficient documentation

## 2015-11-29 DIAGNOSIS — Z86010 Personal history of colonic polyps: Secondary | ICD-10-CM | POA: Insufficient documentation

## 2015-11-29 DIAGNOSIS — D122 Benign neoplasm of ascending colon: Secondary | ICD-10-CM | POA: Diagnosis not present

## 2015-11-29 DIAGNOSIS — K5190 Ulcerative colitis, unspecified, without complications: Secondary | ICD-10-CM | POA: Diagnosis not present

## 2015-11-29 DIAGNOSIS — I2510 Atherosclerotic heart disease of native coronary artery without angina pectoris: Secondary | ICD-10-CM | POA: Diagnosis not present

## 2015-11-29 DIAGNOSIS — I10 Essential (primary) hypertension: Secondary | ICD-10-CM | POA: Insufficient documentation

## 2015-11-29 DIAGNOSIS — Z951 Presence of aortocoronary bypass graft: Secondary | ICD-10-CM | POA: Insufficient documentation

## 2015-11-29 DIAGNOSIS — K5150 Left sided colitis without complications: Secondary | ICD-10-CM | POA: Diagnosis not present

## 2015-11-29 DIAGNOSIS — K635 Polyp of colon: Secondary | ICD-10-CM | POA: Diagnosis not present

## 2015-11-29 HISTORY — PX: COLONOSCOPY WITH PROPOFOL: SHX5780

## 2015-11-29 SURGERY — COLONOSCOPY WITH PROPOFOL
Anesthesia: Monitor Anesthesia Care

## 2015-11-29 MED ORDER — LACTATED RINGERS IV SOLN
INTRAVENOUS | Status: DC | PRN
  Administered 2015-11-29: 10:00:00 via INTRAVENOUS

## 2015-11-29 MED ORDER — PROPOFOL 10 MG/ML IV BOLUS
INTRAVENOUS | Status: DC | PRN
  Administered 2015-11-29: 40 mg via INTRAVENOUS
  Administered 2015-11-29: 100 mg via INTRAVENOUS
  Administered 2015-11-29: 50 mg via INTRAVENOUS
  Administered 2015-11-29: 30 mg via INTRAVENOUS
  Administered 2015-11-29 (×2): 50 mg via INTRAVENOUS
  Administered 2015-11-29: 30 mg via INTRAVENOUS

## 2015-11-29 MED ORDER — SODIUM CHLORIDE 0.9 % IV SOLN: INTRAVENOUS | Status: DC

## 2015-11-29 MED ORDER — PROPOFOL 10 MG/ML IV BOLUS
INTRAVENOUS | Status: AC
  Filled 2015-11-29: qty 40

## 2015-11-29 SURGICAL SUPPLY — 21 items

## 2015-11-29 NOTE — Transfer of Care (Signed)
Immediate Anesthesia Transfer of Care Note  Patient: Barry [name]  Procedure(s) Performed: Procedure(s): COLONOSCOPY WITH PROPOFOL (N/A)  Patient Location: PACU  Anesthesia Type:MAC  Level of Consciousness: awake, alert  and oriented  Airway & Oxygen Therapy: Patient Spontanous Breathing and Patient connected to face mask oxygen  Post-op Assessment: Report given to RN and Post -op Vital signs reviewed and stable  Post vital signs: Reviewed and stable  Last Vitals:  Filed Vitals:   11/29/15 0946  BP: 150/69  Pulse: 89  Temp: 37.3 C  Resp: 19    Complications: No apparent anesthesia complications

## 2015-11-29 NOTE — Discharge Instructions (Signed)
Colonoscopy, Care After °Refer to this sheet in the next few weeks. These instructions provide you with information on caring for yourself after your procedure. Your health care provider may also give you more specific instructions. Your treatment has been planned according to current medical practices, but problems sometimes occur. Call your health care provider if you have any problems or questions after your procedure. °WHAT TO EXPECT AFTER THE PROCEDURE  °After your procedure, it is typical to have the following: °· A small amount of blood in your stool. °· Moderate amounts of gas and mild abdominal cramping or bloating. °HOME CARE INSTRUCTIONS °· Do not drive, operate machinery, or sign important documents for 24 hours. °· You may shower and resume your regular physical activities, but move at a slower pace for the first 24 hours. °· Take frequent rest periods for the first 24 hours. °· Walk around or put a warm pack on your abdomen to help reduce abdominal cramping and bloating. °· Drink enough fluids to keep your urine clear or pale yellow. °· You may resume your normal diet as instructed by your health care provider. Avoid heavy or fried foods that are hard to digest. °· Avoid drinking alcohol for 24 hours or as instructed by your health care provider. °· Only take over-the-counter or prescription medicines as directed by your health care provider. °· If a tissue sample (biopsy) was taken during your procedure: °¨ Do not take aspirin or blood thinners for 7 days, or as instructed by your health care provider. °¨ Do not drink alcohol for 7 days, or as instructed by your health care provider. °¨ Eat soft foods for the first 24 hours. °SEEK MEDICAL CARE IF: °You have persistent spotting of blood in your stool 2-3 days after the procedure. °SEEK IMMEDIATE MEDICAL CARE IF: °· You have more than a small spotting of blood in your stool. °· You pass large blood clots in your stool. °· Your abdomen is swollen  (distended). °· You have nausea or vomiting. °· You have a fever. °· You have increasing abdominal pain that is not relieved with medicine. °  °This information is not intended to replace advice given to you by your health care provider. Make sure you discuss any questions you have with your health care provider. °  °Document Released: 07/17/2004 Document Revised: 09/23/2013 Document Reviewed: 08/10/2013 °Elsevier Interactive Patient Education ©2016 [employer] ° °

## 2015-11-29 NOTE — Anesthesia Postprocedure Evaluation (Signed)
Anesthesia Post Note  Patient: Barry [name]  Procedure(s) Performed: Procedure(s) (LRB): COLONOSCOPY WITH PROPOFOL (N/A)  Patient location during evaluation: PACU Anesthesia Type: MAC Level of consciousness: awake and alert Pain management: pain level controlled Vital Signs Assessment: post-procedure vital signs reviewed and stable Respiratory status: spontaneous breathing, nonlabored ventilation, respiratory function stable and patient connected to nasal cannula oxygen Cardiovascular status: blood pressure returned to baseline and stable Postop Assessment: no signs of nausea or vomiting Anesthetic complications: no    Last Vitals:  Filed Vitals:   11/29/15 1110 11/29/15 1120  BP: 151/76 153/77  Pulse: 88 86  Temp:    Resp: 16 22    Last Pain: There were no vitals filed for this visit.               Brecken Dewoody L

## 2015-11-29 NOTE — Anesthesia Preprocedure Evaluation (Addendum)
Anesthesia Evaluation  Patient identified by MRN, date of birth, ID band Patient awake    Reviewed: Allergy & Precautions, H&P , NPO status , Patient's Chart, lab work & pertinent test results  Airway Mallampati: II  TM Distance: >3 FB Neck ROM: full    Dental  (+) Dental Advisory Given, Missing Front lower missing:   Pulmonary neg pulmonary ROS, asthma ,  Paralyzed left hemidiaphragm   Pulmonary exam normal breath sounds clear to auscultation       Cardiovascular Exercise Tolerance: Good hypertension, Pt. on home beta blockers and Pt. on medications + CAD  negative cardio ROS Normal cardiovascular exam Rhythm:regular Rate:Normal     Neuro/Psych negative neurological ROS  negative psych ROS   GI/Hepatic negative GI ROS, Neg liver ROS, GERD  ,  Endo/Other  negative endocrine ROSMorbid obesity  Renal/GU negative Renal ROS  negative genitourinary   Musculoskeletal   Abdominal (+) + obese,   Peds  Hematology negative hematology ROS (+)   Anesthesia Other Findings   Reproductive/Obstetrics negative OB ROS                           Anesthesia Physical Anesthesia Plan  ASA: III  Anesthesia Plan: MAC   Post-op Pain Management:    Induction:   Airway Management Planned:   Additional Equipment:   Intra-op Plan:   Post-operative Plan:   Informed Consent: I have reviewed the patients History and Physical, chart, labs and discussed the procedure including the risks, benefits and alternatives for the proposed anesthesia with the patient or authorized representative who has indicated his/her understanding and acceptance.   Dental Advisory Given  Plan Discussed with: CRNA and Surgeon  Anesthesia Plan Comments:         Anesthesia Quick Evaluation

## 2015-11-29 NOTE — Op Note (Signed)
Procedure: Surveillance colonoscopy. Chronic universal ulcerative colitis. Adenomatous colon polyps removed colonoscopically in the past  Endoscopist: Earle Gell  Premedication: Propofol administered by anesthesia  Procedure: The patient was placed in the left lateral decubitus position. Anal inspection and digital rectal exam were normal. The Pentax pediatric colonoscope was introduced into the rectum and advanced to the cecum. A normal-appearing ileocecal valve and appendiceal orifice were identified. Colonic preparation for the exam today was good. Withdrawal time was 23 minutes  Rectum. Normal. Retroflexed view of the distal rectum was normal  Sigmoid colon and descending colon. Normal  Splenic flexure. Normal  Transverse colon. Normal  Hepatic flexure. Normal  Ascending colon. A 5 mm sessile polyp was removed with the cold snare  Cecum and ileocecal valve. Normal  Surveillance mucosal biopsies. Four quadrant biopsies were performed approximately every 10 cm along the length of the colon. A total of 32 biopsies were performed and submitted in 4 pathology bottles .  Assessment:  #1. Inactive universal ulcerative colitis  #2. A small polyp was removed from the ascending colon  #3. Surveillance mucosal biopsies to look for mucosal dysplasia were performed

## 2015-11-29 NOTE — H&P (Signed)
  Procedure: Surveillance colonoscopy. Chronic universal ulcerative colitis. History of adenomatous colon polyp removed colonoscopically.  History: The patient is a 69 year old male born [birth year]. He is scheduled to undergo a surveillance colonoscopy today.  Past medical history: Universal ulcerative colitis. History of adenomatous colon polyp removed colonoscopically. Asthmatic bronchitis. Hypertension. Coronary artery disease. 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 Hypercholesterolemia. Osteoarthritis. Left nephrectomy for congenitally deformed kidney. Appendectomy. Tonsillectomy.  Medication allergies: Feldene  Immunizations. Pneumovax was given in 2001. Prevnar vaccination was given in January 2015  Exam: The patient is alert and lying comfortably on the endoscopy stretcher. Abdomen is soft and nontender to palpation. Lungs are clear to auscultation. Cardiac exam reveals a regular rhythm.  Plan: Proceed with surveillance colonoscopy

## 2015-11-30 ENCOUNTER — Encounter (HOSPITAL_COMMUNITY): Payer: Self-pay | Admitting: Gastroenterology

## 2015-12-06 DIAGNOSIS — H402232 Chronic angle-closure glaucoma, bilateral, moderate stage: Secondary | ICD-10-CM | POA: Diagnosis not present

## 2015-12-30 ENCOUNTER — Ambulatory Visit (INDEPENDENT_AMBULATORY_CARE_PROVIDER_SITE_OTHER): Payer: Medicare Other | Admitting: Ophthalmology

## 2016-02-02 DIAGNOSIS — H402232 Chronic angle-closure glaucoma, bilateral, moderate stage: Secondary | ICD-10-CM | POA: Diagnosis not present

## 2016-02-02 DIAGNOSIS — H1013 Acute atopic conjunctivitis, bilateral: Secondary | ICD-10-CM | POA: Diagnosis not present

## 2016-02-02 DIAGNOSIS — H01003 Unspecified blepharitis right eye, unspecified eyelid: Secondary | ICD-10-CM | POA: Diagnosis not present

## 2016-02-02 DIAGNOSIS — H04123 Dry eye syndrome of bilateral lacrimal glands: Secondary | ICD-10-CM | POA: Diagnosis not present

## 2016-02-13 ENCOUNTER — Ambulatory Visit (INDEPENDENT_AMBULATORY_CARE_PROVIDER_SITE_OTHER): Payer: Medicare Other | Admitting: Ophthalmology

## 2016-02-13 DIAGNOSIS — H35033 Hypertensive retinopathy, bilateral: Secondary | ICD-10-CM

## 2016-02-13 DIAGNOSIS — I10 Essential (primary) hypertension: Secondary | ICD-10-CM | POA: Diagnosis not present

## 2016-02-13 DIAGNOSIS — H43813 Vitreous degeneration, bilateral: Secondary | ICD-10-CM | POA: Diagnosis not present

## 2016-02-13 DIAGNOSIS — H35373 Puckering of macula, bilateral: Secondary | ICD-10-CM

## 2016-02-14 ENCOUNTER — Ambulatory Visit (INDEPENDENT_AMBULATORY_CARE_PROVIDER_SITE_OTHER): Payer: Medicare Other | Admitting: Adult Health

## 2016-02-14 ENCOUNTER — Encounter: Payer: Self-pay | Admitting: Adult Health

## 2016-02-14 VITALS — BP 140/82 | HR 99 | Temp 98.3°F | Ht 63.0 in | Wt 234.2 lb

## 2016-02-14 DIAGNOSIS — J4541 Moderate persistent asthma with (acute) exacerbation: Secondary | ICD-10-CM

## 2016-02-14 DIAGNOSIS — J310 Chronic rhinitis: Secondary | ICD-10-CM | POA: Diagnosis not present

## 2016-02-14 MED ORDER — PREDNISONE 10 MG PO TABS: ORAL_TABLET | ORAL | Status: DC

## 2016-02-14 MED ORDER — CEFDINIR 300 MG PO CAPS: 300.0000 mg | ORAL_CAPSULE | Freq: Two times a day (BID) | ORAL | Status: DC

## 2016-02-14 NOTE — Patient Instructions (Signed)
Omnicef 300mg  Twice daily  For 1 week  Prednisone taper over next week.  Eat yogurt while on antibiotics .  Mucinex DM Twice daily  As needed  Cough/congestion  Follow with up 4 months with Dr. Melvyn Novas  and As needed   Please contact office for sooner follow up if symptoms do not improve or worsen or seek emergency care

## 2016-02-14 NOTE — Progress Notes (Signed)
Subjective:    Patient ID: Barry [name], male    DOB: November 08, 1946 .   MRN: [id number]   Brief patient profile:  70 yowm never smoker with relatively mild asthma but superimposed on obesity plus a chronically paralyzed left hemidiaphragm and a tendency to upper airway cough syndrome.    History of Present Illness  03/24/12 [name] ov for sinusitis > rx levaquin > sinus CT > Negative paranasal sinuses.     03/08/2014 f/u ov/[name] re: flare x one week off tramadol  Chief Complaint  Patient presents with  . Acute Visit    Pt c/o increased SOB, wheezing, sinus pressure, and prod cough with lare amounts of green sputum x 4 days.   using rescue 3 -4 x since onset, none at all on day of ov  rec Augmentin 875 mg take one pill twice daily  X 10 days - take at breakfast and supper with large glass of water.   If not improving > Prednisone 10 mg take  4 each am x 2 days,   2 each am x 2 days,  1 each am x 2  and stop    03/26/2014 f/u ov/[name] re: ab flare ? Sinus dz Chief Complaint  Patient presents with  . Follow-up    Pt c/o increased chest congestion for the past 3 days- worse the past day. Breathing is no better since last visit. He also c/o prod cough with minimal thick, tan to green sputum. For the past wk using rescue inhaler 1 to 2 times per day  was better p last ov, then gradually x 3 days  Worse, never took prednisone.  Last saba one day prior to OV   Augmentin 875 mg take one pill twice daily  X 21days - take at breakfast and supper with large glass of water.  It would help reduce the usual side effects (diarrhea and yeast infections) if you ate cultured yogurt at lunch.  Prednisone 10 mg take  4 each am x 2 days,   2 each am x 2 days,  1 each am x 2 days and stop     06/24/2014 f/u ov/[name] re:  ? aeAB Chief Complaint  Patient presents with  . Follow-up    Pt c/o increased cough for the past wk- prod with large amounts of tan to green sputum.  He also has noticed slight increase in  SOB and has used proair x 1 in the past wk.      Onset was abrupt "like a head cold" - no fever, some nasal congestion also / no better on 1st gen H1  rec Augmentin 875 mg take one pill twice daily  X 10 days  Prednisone 10 mg take  4 each am x 2 days,   2 each am x 2 days,  1 each am x 2 days and stop     09/24/2014 f/u ov/[name] re: asthma/ poor hfa even with spacer  On symbicort 160 2 bid Chief Complaint  Patient presents with  . Follow-up  improved p last ov with flare  - more limited by legs than breathing at baseline, only needs saba maybe once or twice daily  rec Work on inhaler technique:  relax and gently blow all the way out then take a nice smooth deep breath back in, triggering the inhaler at same time you start breathing in.  Hold for up to 5 seconds if you can.  Rinse and gargle with water when done Please  schedule a follow up visit in 3 months but call sooner if needed - bring inhaler with spacer with you    02/11/2015 f/u ov/[name] re: asthma/ did not bring spacer as requested Chief Complaint  Patient presents with  . Follow-up    Pt states that his breathing is doing well- rarely uses SABA. He c/o sinus pressure and dry cough for the past wk.     Not limited by breathing from desired activities   >>no changes   05/12/2015 Follow up : Asthma /Chronic Rhinitis  Pt returns for 3 month follow up  We reviewed all her meds and organized them into a med calendar with pt education Appears to be taking correctly.  Does complain of  sinus congestion, PND, occasional prod cough with thick light yellow mucus x3 days.  denies any f/c/s, n/v/d, hemoptysis Taking his cough and sinus regimen with mucinex, saline, afrin and snus decongestants without much help.  Sinus pain and pressure worse for 3 days.  More cough and little wheezing on/off .  >augmentin /pred taper   08/19/2015 NP Follow up : Asthma /Chronic rhinitis  Patient returns for a three-month follow-up.  Pt c/o prod cough  with "tan" colored mcucs, chest and sinus congestion. Increased SOB and wheezing with activity. Denies fever, nausea or vomitting.  Patient was seen 3 months ago with similar symptoms. Patient says that he did improve after he took his antibiotics but over the last week. His notices symptoms are coming back with a productive cough.Daine Gravel 300mg  Twice daily  For 1 week  Prednisone taper over next week.  Eat yogurt while on antibiotics .  Mucinex DM Twice daily  As needed  Cough/congestion     11/16/2015  f/u ov/[name] re: acute exac ab   Chief Complaint  Patient presents with  . Follow-up    Pt c/o increased chest congestion, cough with green mucus and some wheeze/SOB x5 days.     Baseline improves quit a bit between visits on symb 160 2bid maint rx /  not needing saba but started needing 3x daily and a hs x 5 days prior to OV  Ins setting of acute cough/ congestion  >>augmentin and pred taper    02/14/2016 Follow up : Asthma and Chronic Rhinitis  Pt returns for 3 month follow up .  Complains over last 4 days , breathing is up & down. occ SOB. prod. cough brownish in color. occ. wheezing. occ. chest pain/tightness Had asthmatic bronchitis last ov, tx w/ augmentin and prednisone taper. He says he got over until last few days.  Denies chest pain , orthopnea, chest pain, edema or fever.  Remains on Symbicort and mucinex.  Takes chlortrimeton Three times a day  .  Noticed the trees are blooming having more drainage.    Current Medications, Allergies, Complete Past Medical History, Past Surgical History, Family History, and Social History were reviewed in [employer] record.  ROS  The following are not active complaints unless bolded sore throat, dysphagia, dental problems, itching, sneezing,    ear ache,   fever, chills, sweats, unintended wt loss, classically pleuritic or exertional cp, hemoptysis,  orthopnea pnd or leg swelling, presyncope, palpitations,  abdominal pain, anorexia, nausea, vomiting, diarrhea  or change in bowel or bladder habits, change in stools or urine, dysuria,hematuria,  rash, arthralgias, visual complaints, headache, numbness, weakness or ataxia or problems with walking or coordination,  change in mood/affect or memory.  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  no alcohol  single  no children  disabled: textiles         Objective:   Physical Exam GEN: A/Ox3; pleasant , NAD, obese  Chronic abn gait   Filed Vitals:   02/14/16 1408  BP: 140/82  Pulse: 99  Temp: 98.3 F (36.8 C)  Height: 5\' 3"  (1.6 m)  Weight: 234 lb 3.2 oz (106.232 kg)  SpO2: 92%    Wt  233 06/06/11  >> 233 09/18/2013 > 11/05/2013 226 >231 01/06/2014 > 238 03/08/2014 > 03/26/2014 239 > 06/24/2014 238 >  02/11/15 233 >233 05/12/2015 >237 08/19/2015 > 11/16/2015  239 >234 02/14/2016   HEENT:  [place]/AT,  EACs-wax   NOSE-clear drainage,    THROAT-clear, no lesions, no postnasal drip or exudate noted. Class 3 airway  NECK:  Supple w/ fair ROM; no JVD; normal carotid impulses w/o bruits; no thyromegaly or nodules palpated; no lymphadenopathy.  RESP:  faint exp rhonchi   CARD:  RRR, no m/r/g  , no peripheral edema,  pulses intact, no cyanosis or clubbing.  GI:   Soft & nt; nml bowel sounds; no organomegaly or masses detected.  Musco: Warm bil, walks with cane       [name] Parrett NP-C  [facility]  02/14/2016        Assessment & Plan:

## 2016-02-14 NOTE — Assessment & Plan Note (Signed)
Flare w/ recurrent bronchitis   Plan  Omnicef 300mg  Twice daily  For 1 week  Prednisone taper over next week.  Eat yogurt while on antibiotics .  Mucinex DM Twice daily  As needed  Cough/congestion  Follow with up 4 months with Dr. Melvyn Novas  and As needed   Please contact office for sooner follow up if symptoms do not improve or worsen or seek emergency care

## 2016-02-14 NOTE — Progress Notes (Signed)
Chart and office note reviewed in detail along > agree with a/p as outlined  

## 2016-02-14 NOTE — Assessment & Plan Note (Signed)
Cont on current regimen  

## 2016-03-14 DIAGNOSIS — M47812 Spondylosis without myelopathy or radiculopathy, cervical region: Secondary | ICD-10-CM | POA: Diagnosis not present

## 2016-03-14 DIAGNOSIS — G894 Chronic pain syndrome: Secondary | ICD-10-CM | POA: Diagnosis not present

## 2016-03-14 DIAGNOSIS — Z79899 Other long term (current) drug therapy: Secondary | ICD-10-CM | POA: Diagnosis not present

## 2016-03-14 DIAGNOSIS — M179 Osteoarthritis of knee, unspecified: Secondary | ICD-10-CM | POA: Diagnosis not present

## 2016-04-26 DIAGNOSIS — H35341 Macular cyst, hole, or pseudohole, right eye: Secondary | ICD-10-CM | POA: Diagnosis not present

## 2016-04-26 DIAGNOSIS — Z961 Presence of intraocular lens: Secondary | ICD-10-CM | POA: Diagnosis not present

## 2016-04-26 DIAGNOSIS — H402222 Chronic angle-closure glaucoma, left eye, moderate stage: Secondary | ICD-10-CM | POA: Diagnosis not present

## 2016-04-26 DIAGNOSIS — H402212 Chronic angle-closure glaucoma, right eye, moderate stage: Secondary | ICD-10-CM | POA: Diagnosis not present

## 2016-05-09 DIAGNOSIS — H402222 Chronic angle-closure glaucoma, left eye, moderate stage: Secondary | ICD-10-CM | POA: Diagnosis not present

## 2016-05-23 DIAGNOSIS — H402222 Chronic angle-closure glaucoma, left eye, moderate stage: Secondary | ICD-10-CM | POA: Diagnosis not present

## 2016-06-13 ENCOUNTER — Encounter: Payer: Medicare Other | Admitting: Internal Medicine

## 2016-06-13 ENCOUNTER — Encounter: Payer: Self-pay | Admitting: Internal Medicine

## 2016-06-13 ENCOUNTER — Ambulatory Visit (INDEPENDENT_AMBULATORY_CARE_PROVIDER_SITE_OTHER): Payer: Medicare Other | Admitting: Internal Medicine

## 2016-06-13 DIAGNOSIS — J4530 Mild persistent asthma, uncomplicated: Secondary | ICD-10-CM | POA: Diagnosis not present

## 2016-06-13 DIAGNOSIS — J310 Chronic rhinitis: Secondary | ICD-10-CM | POA: Diagnosis not present

## 2016-06-13 MED ORDER — PREDNISONE 10 MG PO TABS: ORAL_TABLET | ORAL | Status: DC

## 2016-06-13 NOTE — Patient Instructions (Addendum)
Prednisone 10 mg take  4 each am x 2 days,   2 each am x 2 days,  1 each am x 2 days and stop   Work on inhaler technique:  relax and gently blow all the way out then take a nice smooth deep breath back in, triggering the inhaler at same time you start breathing in.  Hold for up to 5 seconds if you can. Blow out thru nose. Rinse and gargle with water when done  Please schedule a follow up visit in 3 months but call sooner if needed to See [name] for new med calendar and bring your spacer for training every visit - ? Add singulair ?

## 2016-06-13 NOTE — Assessment & Plan Note (Signed)
-   03/24/12 CT Sinus > Negative paranasal sinuses - Sinus CT 08/07/2013 >>Short air-fluid levels in the maxillary sinuses bilaterally suggesting early acute sinusitis - Repeat augmentin x 21 days 03/27/2014 then sinus ct if not better  - flonase/afrin regimen 02/11/15 >>>  I emphasized that nasal steroids have no immediate benefit in terms of improving symptoms.  To help them reached the target tissue, the patient should use Afrin two puffs every 12 hours applied one min before using the nasal steroids.  Afrin should be stopped after no more than 5 days.  If the symptoms worsen, Afrin can be restarted after 5 days off of therapy to prevent rebound congestion from overuse of Afrin.  I also emphasized that in no way are nasal steroids a concern in terms of "addiction".   Consider adding singulair trial next ov as don't see where this has been tried and he does not recall it.

## 2016-06-13 NOTE — Assessment & Plan Note (Signed)
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  - HFA 25% August 01, 2009 > 50% November 01, 2009 > 50% January 31, 2010 therefore changed to neb bud/brovana - HFA 75% p extensive coaching 08/07/2013  > insurance issues so try symbicort 160 2 bid instead of neb - 11/16/2015   p extensive coaching HFA effectiveness =    75% with  spacer  - 06/13/2016  After extensive coaching HFA effectiveness =    50% s spacer   Recurrent pattern of improvement on a abx/ pred not clear whether really needs both and strongly suspect the issue is In this case Adherence  and starts with  inability to use HFA effectively and also  understand that SABA treats the symptoms but doesn't get to the underlying problem (inflammation).  I used  the analogy of putting steroid cream on a rash to help explain the meaning of topical therapy and the need to get the drug to the target tissue.    For now try Prednisone 10 mg take  4 each am x 2 days,   2 each am x 2 days,  1 each am x 2 days and stop and no abx and keep working on better hfa technique with or without the spacer  I had an extended discussion with the patient reviewing all relevant studies completed to date and  lasting 15 to 20 minutes of a 25 minute visit    Each maintenance medication was reviewed in detail including most importantly the difference between maintenance and prns and under what circumstances the prns are to be triggered using an action plan format that is not reflected in the computer generated alphabetically organized AVS but trather by a customized med calendar that reflects the AVS meds with confirmed 100% correlation.   Please see instructions for details which were reviewed in writing and the patient given a copy highlighting the part that I personally wrote and discussed at today's ov.

## 2016-06-13 NOTE — Progress Notes (Signed)
Subjective:    Patient ID: Barry Barry [name], male    DOB: 72-May-1947 .   MRN: [id number]   Brief patient profile:  70 yowm never smoker with relatively mild asthma but superimposed on obesity plus a chronically paralyzed left hemidiaphragm and a tendency to upper airway cough syndrome.    History of Present Illness  03/24/12 [name] ov for sinusitis > rx levaquin > sinus CT > Negative paranasal sinuses.    11/16/2015  f/u ov/Barry Barry [name] re: acute exac ab   Chief Complaint  Patient presents with  . Follow-up    Pt c/o increased chest congestion, cough with green mucus and some wheeze/SOB x5 days.    Baseline improves quit a bit between visits on symb 160 2bid maint rx /  not needing saba but started needing 3x daily and a hs x 5 days prior to OV  Ins setting of acute cough/ congestion  rec Augmentin 875 mg take one pill twice daily  X 10 days - Prednisone 10 mg take  4 each am x 2 days,   2 each am x 2 days,  1 each am x 2 days and stop  Remember to use afrin x 5 days before each dose of flonase Remember to use the flutter valve Work on inhaler technique:    Please schedule a follow up visit in 3 months but call sooner if needed to see Barry Barry [name]    02/14/2016 Barry [name] Follow up : Asthma and Chronic Rhinitis  Pt returns for 3 month follow up .  Complains over last 4 days , breathing is up & down. occ SOB. prod. cough brownish in color. occ. wheezing. occ. chest pain/tightness Had asthmatic bronchitis last ov, tx w/ augmentin and prednisone taper. He says he got over until last few days.  Denies chest pain , orthopnea, chest pain, edema or fever.  Remains on Symbicort and mucinex.  Takes chlortrimeton Three times a day  .  Noticed the trees are blooming having more drainage.  rec Omnicef 300mg  Twice daily  For 1 week  Prednisone taper over next week.  Eat yogurt while on antibiotics .  Mucinex DM Twice daily  As needed  Cough/congestion     06/13/2016  f/u ov/Barry Barry [name] re:  AB/ symbicort 160  2bid/flonase - using med calendar well  Does really well p pred each time he gets it then gradually worse over several weeks esp in am cough cough / congestion/ slt yellow mucus / slt more sob but rare need for saba  Worse now x sev weeks again same exact symptoms with also nasal congestion but not able to use afrin or hfa effectively - see a/p  No obvious day to day or daytime variability or assoc chronic cough or cp or chest tightness, subjective wheeze or overt sinus or hb symptoms. No unusual exp hx or h/o childhood pna/ asthma or knowledge of premature birth.  Sleeping ok without nocturnal  or early am exacerbation  of respiratory  c/o's or need for noct saba. Also denies any obvious fluctuation of symptoms with weather or environmental changes or other aggravating or alleviating factors except as outlined above   Current Medications, Allergies, Complete Past Medical History, Past Surgical History, Family History, and Social History were reviewed in [employer] record.  ROS  The following are not active complaints unless bolded sore throat, dysphagia, dental problems, itching, sneezing,  nasal congestion or excess/ purulent secretions, ear ache,   fever, chills, sweats, unintended wt loss,  classically pleuritic or exertional cp, hemoptysis,  orthopnea pnd or leg swelling, presyncope, palpitations, abdominal pain, anorexia, nausea, vomiting, diarrhea  or change in bowel or bladder habits, change in stools or urine, dysuria,hematuria,  rash, arthralgias, visual complaints, headache, numbness, weakness or ataxia or problems with walking or coordination,  change in mood/affect or memory.                 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 Class 3 airway   NECK:  Supple w/ fair ROM; no JVD; normal carotid impulses w/o bruits; no thyromegaly or nodules palpated; no lymphadenopathy.  RESP:  Minimal insp  and exp rhonchi/ no increased wob   CARD:  RRR, no m/r/g  , no peripheral edema, pulses intact, no cyanosis or clubbing.  GI:   Soft & nt; nml bowel sounds; no organomegaly or masses detected.  Musco: Warm bil, walks with cane         Assessment & Plan:

## 2016-06-22 DIAGNOSIS — I252 Old myocardial infarction: Secondary | ICD-10-CM | POA: Diagnosis not present

## 2016-06-22 DIAGNOSIS — I2510 Atherosclerotic heart disease of native coronary artery without angina pectoris: Secondary | ICD-10-CM | POA: Diagnosis not present

## 2016-06-22 DIAGNOSIS — E668 Other obesity: Secondary | ICD-10-CM | POA: Diagnosis not present

## 2016-06-22 DIAGNOSIS — I10 Essential (primary) hypertension: Secondary | ICD-10-CM | POA: Diagnosis not present

## 2016-06-22 DIAGNOSIS — Z01810 Encounter for preprocedural cardiovascular examination: Secondary | ICD-10-CM | POA: Diagnosis not present

## 2016-06-22 DIAGNOSIS — E785 Hyperlipidemia, unspecified: Secondary | ICD-10-CM | POA: Diagnosis not present

## 2016-07-04 DIAGNOSIS — Z125 Encounter for screening for malignant neoplasm of prostate: Secondary | ICD-10-CM | POA: Diagnosis not present

## 2016-07-04 DIAGNOSIS — E7800 Pure hypercholesterolemia, unspecified: Secondary | ICD-10-CM | POA: Diagnosis not present

## 2016-07-04 DIAGNOSIS — K5100 Ulcerative (chronic) pancolitis without complications: Secondary | ICD-10-CM | POA: Diagnosis not present

## 2016-07-04 DIAGNOSIS — H6123 Impacted cerumen, bilateral: Secondary | ICD-10-CM | POA: Diagnosis not present

## 2016-07-04 DIAGNOSIS — Z0000 Encounter for general adult medical examination without abnormal findings: Secondary | ICD-10-CM | POA: Diagnosis not present

## 2016-07-04 DIAGNOSIS — I2510 Atherosclerotic heart disease of native coronary artery without angina pectoris: Secondary | ICD-10-CM | POA: Diagnosis not present

## 2016-07-04 DIAGNOSIS — Z1389 Encounter for screening for other disorder: Secondary | ICD-10-CM | POA: Diagnosis not present

## 2016-07-04 DIAGNOSIS — Z6841 Body Mass Index (BMI) 40.0 and over, adult: Secondary | ICD-10-CM | POA: Diagnosis not present

## 2016-07-09 ENCOUNTER — Other Ambulatory Visit: Payer: Self-pay | Admitting: Adult Health

## 2016-08-22 ENCOUNTER — Other Ambulatory Visit: Payer: Self-pay | Admitting: Internal Medicine

## 2016-08-22 MED ORDER — ALBUTEROL SULFATE HFA 108 (90 BASE) MCG/ACT IN AERS: INHALATION_SPRAY | RESPIRATORY_TRACT | 0 refills | Status: DC

## 2016-08-22 MED ORDER — BUDESONIDE-FORMOTEROL FUMARATE 160-4.5 MCG/ACT IN AERO: 2.0000 | INHALATION_SPRAY | Freq: Two times a day (BID) | RESPIRATORY_TRACT | 0 refills | Status: DC

## 2016-08-29 ENCOUNTER — Other Ambulatory Visit: Payer: Self-pay | Admitting: Internal Medicine

## 2016-08-29 MED ORDER — BUDESONIDE-FORMOTEROL FUMARATE 160-4.5 MCG/ACT IN AERO: 2.0000 | INHALATION_SPRAY | Freq: Two times a day (BID) | RESPIRATORY_TRACT | 3 refills | Status: DC

## 2016-08-30 DIAGNOSIS — H1013 Acute atopic conjunctivitis, bilateral: Secondary | ICD-10-CM | POA: Diagnosis not present

## 2016-08-30 DIAGNOSIS — H402212 Chronic angle-closure glaucoma, right eye, moderate stage: Secondary | ICD-10-CM | POA: Diagnosis not present

## 2016-08-30 DIAGNOSIS — H04123 Dry eye syndrome of bilateral lacrimal glands: Secondary | ICD-10-CM | POA: Diagnosis not present

## 2016-08-30 DIAGNOSIS — H402222 Chronic angle-closure glaucoma, left eye, moderate stage: Secondary | ICD-10-CM | POA: Diagnosis not present

## 2016-09-12 DIAGNOSIS — G894 Chronic pain syndrome: Secondary | ICD-10-CM | POA: Diagnosis not present

## 2016-09-12 DIAGNOSIS — M179 Osteoarthritis of knee, unspecified: Secondary | ICD-10-CM | POA: Diagnosis not present

## 2016-09-12 DIAGNOSIS — M47812 Spondylosis without myelopathy or radiculopathy, cervical region: Secondary | ICD-10-CM | POA: Diagnosis not present

## 2016-09-12 DIAGNOSIS — Z79899 Other long term (current) drug therapy: Secondary | ICD-10-CM | POA: Diagnosis not present

## 2016-09-14 ENCOUNTER — Encounter: Payer: Medicare Other | Admitting: Adult Health

## 2016-09-18 ENCOUNTER — Ambulatory Visit (INDEPENDENT_AMBULATORY_CARE_PROVIDER_SITE_OTHER): Payer: Medicare Other | Admitting: Adult Health

## 2016-09-18 ENCOUNTER — Encounter: Payer: Self-pay | Admitting: Adult Health

## 2016-09-18 DIAGNOSIS — J4531 Mild persistent asthma with (acute) exacerbation: Secondary | ICD-10-CM

## 2016-09-18 MED ORDER — PREDNISONE 10 MG PO TABS: ORAL_TABLET | ORAL | 0 refills | Status: DC

## 2016-09-18 MED ORDER — AMOXICILLIN-POT CLAVULANATE 875-125 MG PO TABS: 1.0000 | ORAL_TABLET | Freq: Two times a day (BID) | ORAL | 0 refills | Status: DC

## 2016-09-18 NOTE — Patient Instructions (Signed)
Augmentin  875mg  Twice daily for 1 week  Mucinex DM Twice daily  As needed  Cough/congestion  Prednisone taper over next week.  Follow med calendar closely and bring to each visit.  Follow with up 3 months with Dr. Melvyn Novas   and As needed   Please contact office for sooner follow up if symptoms do not improve or worsen or seek emergency care

## 2016-09-18 NOTE — Progress Notes (Signed)
Subjective:    Patient ID: Barry [name], male    DOB: 07/25/1946 .   MRN: [id number]   Brief patient profile:  89 yowm never smoker with relatively mild asthma but superimposed on obesity plus a chronically paralyzed left hemidiaphragm and a tendency to upper airway cough syndrome.    History of Present Illness  03/24/12 [name] ov for sinusitis > rx levaquin > sinus CT > Negative paranasal sinuses.    11/16/2015  f/u ov/[name] re: acute exac ab   Chief Complaint  Patient presents with  . Follow-up    Pt c/o increased chest congestion, cough with green mucus and some wheeze/SOB x5 days.    Baseline improves quit a bit between visits on symb 160 2bid maint rx /  not needing saba but started needing 3x daily and a hs x 5 days prior to OV  Ins setting of acute cough/ congestion  rec Augmentin 875 mg take one pill twice daily  X 10 days - Prednisone 10 mg take  4 each am x 2 days,   2 each am x 2 days,  1 each am x 2 days and stop  Remember to use afrin x 5 days before each dose of flonase Remember to use the flutter valve Work on inhaler technique:    Please schedule a follow up visit in 3 months but call sooner if needed to see Barry Malachi NP    02/14/2016 NP Follow up : Asthma and Chronic Rhinitis  Pt returns for 3 month follow up .  Complains over last 4 days , breathing is up & down. occ SOB. prod. cough brownish in color. occ. wheezing. occ. chest pain/tightness Had asthmatic bronchitis last ov, tx w/ augmentin and prednisone taper. He says he got over until last few days.  Denies chest pain , orthopnea, chest pain, edema or fever.  Remains on Symbicort and mucinex.  Takes chlortrimeton Three times a day  .  Noticed the trees are blooming having more drainage.  rec Omnicef 300mg  Twice daily  For 1 week  Prednisone taper over next week.  Eat yogurt while on antibiotics .  Mucinex DM Twice daily  As needed  Cough/congestion     06/13/2016  f/u ov/[name] re:  AB/ symbicort 160  2bid/flonase - using med calendar well  Does really well p pred each time he gets it then gradually worse over several weeks esp in am cough cough / congestion/ slt yellow mucus / slt more sob but rare need for saba  Worse now x sev weeks again same exact symptoms with also nasal congestion but not able to use afrin or hfa effectively - see a/p >>pred taper   09/18/2016 Follow up : Asthma /Med review  Returns for a 3 month follow up .  Says sinus congestion and cough have picked back up for last couple of weeks. Coughing up thick green mucus. Has a lot of sinus congestion and drainage.  Last abx was 01/2016 .  We reviewed all his medications organize them into a medication calendar with patient education Appears to be take his medications correctly. We went over spacer  use with his Symbicort, which he did very well. Denies any chest pain, orthopnea, PND, increased leg swelling or hemoptysis     Current Medications, Allergies, Complete Past Medical History, Past Surgical History, Family History, and Social History were reviewed in [employer] record.  ROS  The following are not active complaints unless bolded sore throat, dysphagia,  dental problems, itching, sneezing,  nasal congestion or excess/ purulent secretions, ear ache,   fever, chills, sweats, unintended wt loss, classically pleuritic or exertional cp, hemoptysis,  orthopnea pnd or leg swelling, presyncope, palpitations, abdominal pain, anorexia, nausea, vomiting, diarrhea  or change in bowel or bladder habits, change in stools or urine, dysuria,hematuria,  rash, arthralgias, visual complaints, headache, numbness, weakness or ataxia or problems with walking or coordination,  change in mood/affect or memory.                 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  no alcohol  single  no children  disabled: textiles         Objective:   Physical Exam GEN: A/Ox3; pleasant , NAD, obese  Chronic abn gait  Vitals:   09/18/16 1515  BP: 120/72  Pulse: 72  Temp: 98 F (36.7 C)  TempSrc: Oral  SpO2: 94%  Weight: 229 lb (103.9 kg)  Height: 5\' 6"  (1.676 m)        Wt  233 06/06/11  >> 233 09/18/2013 > 11/05/2013 226 >231 01/06/2014 > 238 03/08/2014 > 03/26/2014 239 > 06/24/2014 238 >  02/11/15 233 >233 05/12/2015 >237 08/19/2015 > 11/16/2015  239 >234 02/14/2016 >  06/13/2016   234   HEENT:  [place]/AT,  EACs-wax   NOSE-clear drainage,    THROAT-clear, no lesions, no postnasal drip or exudate noted. Class 3 airway   NECK:  Supple w/ fair ROM; no JVD; normal carotid impulses w/o bruits; no thyromegaly or nodules palpated; no lymphadenopathy.    RESP:  Decreased BS in bases and few trace exp wheezes   CARD:  RRR, no m/r/g  , no peripheral edema, pulses intact, no cyanosis or clubbing.  GI:   Soft & nt; nml bowel sounds; no organomegaly or masses detected.   Musco: Warm bil, walks with cane         Assessment & Plan:

## 2016-09-18 NOTE — Progress Notes (Signed)
Chart and office note reviewed in detail  > agree with a/p as outlined    

## 2016-09-18 NOTE — Assessment & Plan Note (Addendum)
Mild flare with bronchitis  .Patient's medications were reviewed today and patient education was given. Computerized medication calendar was adjusted/completed    Plan  Patient Instructions  Augmentin  875mg  Twice daily for 1 week  Mucinex DM Twice daily  As needed  Cough/congestion  Prednisone taper over next week.  Follow med calendar closely and bring to each visit.  Follow with up 3 months with Dr. Melvyn Novas   and As needed   Please contact office for sooner follow up if symptoms do not improve or worsen or seek emergency care

## 2016-09-19 NOTE — Addendum Note (Signed)
Addended by: Osa Craver on: 09/19/2016 12:47 PM   Modules accepted: Orders

## 2016-10-04 DIAGNOSIS — Z23 Encounter for immunization: Secondary | ICD-10-CM | POA: Diagnosis not present

## 2016-11-14 ENCOUNTER — Ambulatory Visit (INDEPENDENT_AMBULATORY_CARE_PROVIDER_SITE_OTHER): Payer: Medicare Other | Admitting: Ophthalmology

## 2016-11-14 DIAGNOSIS — H35033 Hypertensive retinopathy, bilateral: Secondary | ICD-10-CM

## 2016-11-14 DIAGNOSIS — H43813 Vitreous degeneration, bilateral: Secondary | ICD-10-CM | POA: Diagnosis not present

## 2016-11-14 DIAGNOSIS — H353121 Nonexudative age-related macular degeneration, left eye, early dry stage: Secondary | ICD-10-CM

## 2016-11-14 DIAGNOSIS — I10 Essential (primary) hypertension: Secondary | ICD-10-CM

## 2016-11-14 DIAGNOSIS — H35371 Puckering of macula, right eye: Secondary | ICD-10-CM

## 2016-12-20 ENCOUNTER — Ambulatory Visit: Payer: Medicare Other | Admitting: Internal Medicine

## 2017-01-24 DIAGNOSIS — H402232 Chronic angle-closure glaucoma, bilateral, moderate stage: Secondary | ICD-10-CM | POA: Diagnosis not present

## 2017-01-24 DIAGNOSIS — H04123 Dry eye syndrome of bilateral lacrimal glands: Secondary | ICD-10-CM | POA: Diagnosis not present

## 2017-01-24 DIAGNOSIS — H1013 Acute atopic conjunctivitis, bilateral: Secondary | ICD-10-CM | POA: Diagnosis not present

## 2017-02-20 DIAGNOSIS — M179 Osteoarthritis of knee, unspecified: Secondary | ICD-10-CM | POA: Diagnosis not present

## 2017-02-20 DIAGNOSIS — G894 Chronic pain syndrome: Secondary | ICD-10-CM | POA: Diagnosis not present

## 2017-02-20 DIAGNOSIS — Z79899 Other long term (current) drug therapy: Secondary | ICD-10-CM | POA: Diagnosis not present

## 2017-02-20 DIAGNOSIS — M47812 Spondylosis without myelopathy or radiculopathy, cervical region: Secondary | ICD-10-CM | POA: Diagnosis not present

## 2017-04-01 ENCOUNTER — Ambulatory Visit (INDEPENDENT_AMBULATORY_CARE_PROVIDER_SITE_OTHER): Payer: Medicare Other | Admitting: Adult Health

## 2017-04-01 ENCOUNTER — Encounter: Payer: Self-pay | Admitting: Adult Health

## 2017-04-01 DIAGNOSIS — J4531 Mild persistent asthma with (acute) exacerbation: Secondary | ICD-10-CM

## 2017-04-01 DIAGNOSIS — J310 Chronic rhinitis: Secondary | ICD-10-CM | POA: Diagnosis not present

## 2017-04-01 MED ORDER — CEFDINIR 300 MG PO CAPS: 300.0000 mg | ORAL_CAPSULE | Freq: Two times a day (BID) | ORAL | 0 refills | Status: DC

## 2017-04-01 MED ORDER — PREDNISONE 10 MG PO TABS
ORAL_TABLET | ORAL | 0 refills | Status: DC
Start: 2017-04-01 — End: 2017-07-08

## 2017-04-01 NOTE — Patient Instructions (Addendum)
Omnicef Twice daily for 1 week  Mucinex DM Twice daily  As needed  Cough/congestion  Prednisone taper over next week.  Saline nasal rinses As needed  Begin Allegra 180mg  daily in am.  Continue on Symbicort Twice daily  . Follow with up 3-4 months and As needed   Please contact office for sooner follow up if symptoms do not improve or worsen or seek emergency care

## 2017-04-01 NOTE — Assessment & Plan Note (Signed)
Flare with URI/Bronchitis   Plan  Patient Instructions  Omnicef Twice daily for 1 week  Mucinex DM Twice daily  As needed  Cough/congestion  Prednisone taper over next week.  Saline nasal rinses As needed  Begin Allegra 180mg  daily in am.  Continue on Symbicort Twice daily  . Follow with up 3-4 months and As needed   Please contact office for sooner follow up if symptoms do not improve or worsen or seek emergency care

## 2017-04-01 NOTE — Assessment & Plan Note (Signed)
Add allegra

## 2017-04-01 NOTE — Progress Notes (Signed)
@Patient  ID: Barry [name], male    DOB: [birth year], 71 y.o.   MRN: [id number]  Chief Complaint  Patient presents with  . Follow-up    Asthma    Referring provider: [name], MD  HPI: 55 yowm never smoker with relatively mild asthma but superimposed on obesity plus a chronically paralyzed left hemidiaphragm and a tendency to upper airway cough syndrome.  Has Ulcerative Colitis -Followed by Dr. Wynetta Emery  TEST   PFTs 06/15/2005 FEV1 85% predicted ratio 68% and truncation of respiratory loop in a sawtooth pattern  03/24/12 CT Sinus > Negative paranasal sinuses - Sinus CT 08/07/2013 >>Short air-fluid levels in the maxillary sinuses bilaterally suggesting early acute sinusitis PVX 2016  Prevnar 2015   04/01/2017 Follow up : Asthma /Chronic Rhinitis  Patient returns for a six-month follow-up. Says over last 2 weeks of increased cough, congestion , drainage , wheezing with yellow /green mucus .  He remains on Symbicort twice daily for asthma. He has chronic rhinitis on Flonase daily.  Has tried chlortrimeton without any help.  We discussed trying allegra.  Denies chest pain, orthopnea, edema.         Allergies  Allergen Reactions  . Piroxicam Itching and Rash    Immunization History  Administered Date(s) Administered  . Influenza Split 10/17/2012, 09/16/2014, 10/15/2015  . Influenza Whole 08/17/2009, 09/11/2010, 09/17/2011  . Influenza,inj,Quad PF,36+ Mos 09/18/2013  . Pneumococcal Conjugate-13 01/05/2014  . Pneumococcal Polysaccharide-23 11/16/2005, 11/01/2009  . Tdap 02/24/2015    Past Medical History:  Diagnosis Date  . Asthma    PFTs 06/15/05 FEV1 85% predicted ratio 68% and truncation of resp loop in a sawtooth pattern. HFA 25% 08-01-2009 >50% Nov 01, 2009>50% 01-31-2010  . Cataract   . Chronic cough   . Coronary heart disease   . Glaucoma   . Hypertension   . Obesity   . 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 Retina disorder 2014  .  Ulcerative colitis     Tobacco History: History  Smoking Status  . Never Smoker  Smokeless Tobacco  . Never Used   Counseling given: Not Answered   Outpatient Encounter Prescriptions as of 04/01/2017  Medication Sig  . acetaminophen (TYLENOL) 500 MG tablet Take per bottle as needed for arthritis  . albuterol (PROVENTIL HFA;VENTOLIN HFA) 108 (90 BASE) MCG/ACT inhaler Inhale 2 puffs into the lungs every 4 (four) hours as needed for wheezing or shortness of breath (((PLAN B))).   [name] aspirin 81 MG tablet Take 81 mg by mouth daily.  [name] atorvastatin (LIPITOR) 40 MG tablet Take 40 mg by mouth daily.    . budesonide-formoterol (SYMBICORT) 160-4.5 MCG/ACT inhaler Inhale 2 puffs into the lungs 2 (two) times daily.  . carboxymethylcellulose (REFRESH PLUS) 0.5 % SOLN Use as needed for dry eye  . chlorpheniramine (CHLOR-TRIMETON) 4 MG tablet Take 4 mg by mouth every 4 (four) hours as needed.   . cholecalciferol (VITAMIN D) 1000 UNITS tablet Take 1,000 Units by mouth daily.  . cycloSPORINE (RESTASIS) 0.05 % ophthalmic emulsion Place 1 drop into both eyes 2 (two) times daily.  [name] Dextromethorphan-Guaifenesin (MUCINEX DM MAXIMUM STRENGTH) 60-1200 MG per 12 hr tablet Take 1 tablet by mouth every 12 (twelve) hours as needed (for cough or thick mucus).   [name] digoxin (LANOXIN) 0.125 MG tablet Take 0.125 mg by mouth daily.   [name] diltiazem (DILACOR XR) 180 MG 24 hr capsule Take 180 mg by mouth daily.    [name]  docusate sodium (STOOL SOFTENER) 100 MG capsule Take 100 mg by mouth daily as needed for mild constipation.   . Eyelid Cleansers (OCUSOFT EYELID CLEANSING) PADS Apply topically. Use daily  . ezetimibe (ZETIA) 10 MG tablet Take 10 mg by mouth daily.    . famotidine (PEPCID) 20 MG tablet Take 20 mg by mouth at bedtime.    . fluticasone (FLONASE) 50 MCG/ACT nasal spray Place 2 sprays into both nostrils 2 (two) times daily.   . folic acid (FOLVITE) 1 MG tablet Take 1 mg by mouth daily.    [name] latanoprost (XALATAN)  0.005 % ophthalmic solution Place 1 drop into both eyes at bedtime.  [name] Lifitegrast (XIIDRA) 5 % SOLN Apply 1 drop to eye 2 (two) times daily.  . metoprolol tartrate (LOPRESSOR) 25 MG tablet Take 12.5 mg by mouth 2 (two) times daily.   . Multiple Vitamins-Minerals (CENTRUM SILVER PO) Take 1 tablet by mouth daily.  [name] omeprazole (PRILOSEC) 20 MG capsule Take 20 mg by mouth 2 (two) times daily before a meal.   . oxymetazoline (AFRIN) 0.05 % nasal spray Place 1 spray into both nostrils 2 (two) times daily as needed for congestion (for 5 days, then stop).   Vladimir Faster Glyc-Propyl Glyc PF (SYSTANE ULTRA PF) 0.4-0.3 % SOLN Apply 1 drop to eye as needed.  . predniSONE (DELTASONE) 10 MG tablet Take  4 each am x 2 days,   2 each am x 2 days,  1 each am x 2 days and stop  . Pseudoephedrine-Ibuprofen (ADVIL COLD/SINUS) 30-200 MG TABS Per bottle as needed for sinus pressure/pain  . sodium chloride (OCEAN) 0.65 % SOLN nasal spray Use as needed for nasal congestion  . Spacer/Aero-Holding Chambers (AEROCHAMBER MV) inhaler Use as instructed  . sulfaSALAzine (AZULFIDINE) 500 MG tablet Take 1,000 mg by mouth 4 (four) times daily.   . cefdinir (OMNICEF) 300 MG capsule Take 1 capsule (300 mg total) by mouth 2 (two) times daily.  . predniSONE (DELTASONE) 10 MG tablet 4 tabs for 2 days, then 3 tabs for 2 days, 2 tabs for 2 days, then 1 tab for 2 days, then stop   No facility-administered encounter medications on file as of 04/01/2017.      Review of Systems  Constitutional:   No  weight loss, night sweats,  Fevers, chills, fatigue, or  lassitude.  HEENT:   No headaches,  Difficulty swallowing,  Tooth/dental problems, or  Sore throat,                No sneezing, itching, ear ache,  +nasal congestion, post nasal drip,   CV:  No chest pain,  Orthopnea, PND, swelling in lower extremities, anasarca, dizziness, palpitations, syncope.   GI  No heartburn, indigestion, abdominal pain, nausea, vomiting, diarrhea, change  in bowel habits, loss of appetite, bloody stools.   Resp:    No chest wall deformity  Skin: no rash or lesions.  GU: no dysuria, change in color of urine, no urgency or frequency.  No flank pain, no hematuria   MS:  No joint pain or swelling.  No decreased range of motion.  No back pain.    Physical Exam  BP (!) 148/80 (BP Location: Left Arm, Cuff Size: Normal)   Pulse 86   Ht 5\' 5"  (1.651 m)   Wt 229 lb 12.8 oz (104.2 kg)   SpO2 94%   BMI 38.24 kg/m   GEN: A/Ox3; pleasant , NAD    HEENT:  [place]/AT,  EACs-clear,  TMs-wnl, NOSE-clear, THROAT-clear, no lesions, no postnasal drip or exudate noted.   NECK:  Supple w/ fair ROM; no JVD; normal carotid impulses w/o bruits; no thyromegaly or nodules palpated; no lymphadenopathy.    RESP  Few trace rhonchi ,  no accessory muscle use, no dullness to percussion  CARD:  RRR, no m/r/g, no peripheral edema, pulses intact, no cyanosis or clubbing.  GI:   Soft & nt; nml bowel sounds; no organomegaly or masses detected.   Musco: Warm bil, no deformities or joint swelling noted.   Neuro: alert, no focal deficits noted.    Skin: Warm, no lesions or rashes    Lab Results:   BNP No results found for: BNP  ProBNP No results found for: PROBNP  Imaging: No results found.   Assessment & Plan:   Asthma, chronic Flare with URI/Bronchitis   Plan  Patient Instructions  Omnicef Twice daily for 1 week  Mucinex DM Twice daily  As needed  Cough/congestion  Prednisone taper over next week.  Saline nasal rinses As needed  Begin Allegra 180mg  daily in am.  Continue on Symbicort Twice daily  . Follow with up 3-4 months and As needed   Please contact office for sooner follow up if symptoms do not improve or worsen or seek emergency care      Chronic rhinitis Add allegra      Rexene Edison, NP 04/01/2017

## 2017-04-01 NOTE — Progress Notes (Signed)
Chart and office note reviewed in detail  > agree with a/p as outlined    

## 2017-04-29 DIAGNOSIS — H35033 Hypertensive retinopathy, bilateral: Secondary | ICD-10-CM | POA: Diagnosis not present

## 2017-04-29 DIAGNOSIS — H402232 Chronic angle-closure glaucoma, bilateral, moderate stage: Secondary | ICD-10-CM | POA: Diagnosis not present

## 2017-04-29 DIAGNOSIS — H35371 Puckering of macula, right eye: Secondary | ICD-10-CM | POA: Diagnosis not present

## 2017-04-29 DIAGNOSIS — Z961 Presence of intraocular lens: Secondary | ICD-10-CM | POA: Diagnosis not present

## 2017-07-08 ENCOUNTER — Encounter: Payer: Self-pay | Admitting: Adult Health

## 2017-07-08 ENCOUNTER — Ambulatory Visit (INDEPENDENT_AMBULATORY_CARE_PROVIDER_SITE_OTHER): Payer: Medicare Other | Admitting: Adult Health

## 2017-07-08 DIAGNOSIS — J310 Chronic rhinitis: Secondary | ICD-10-CM

## 2017-07-08 DIAGNOSIS — J4530 Mild persistent asthma, uncomplicated: Secondary | ICD-10-CM

## 2017-07-08 NOTE — Progress Notes (Signed)
@Patient  ID: Barry [name], male    DOB: 1946-07-24, 71 y.o.   MRN: [id number]  Chief Complaint  Patient presents with  . Follow-up    Asthma     Referring provider: [name], MD  HPI: HPI: 33 yowm never smoker with relatively mild asthma but superimposed on obesity plus a chronically paralyzed left hemidiaphragm and a tendency to upper airway cough syndrome.  Has Ulcerative Colitis -Followed by Dr. Wynetta Emery  TEST  PFTs 06/15/2005 FEV1 85% predicted ratio 68% and truncation of respiratory loop in a sawtooth pattern  03/24/12 CT Sinus >Negative paranasal sinuses - Sinus CT 08/07/2013>>Short air-fluid levels in the maxillary sinuses bilaterally suggesting early acute sinusitis PVX 2016  Prevnar 2015   07/08/2017 Follow up : Asthma /Chronic Rhinitis  Pt returns for a 3 month follow up . Patient was seen last visit with an asthma and allergy flare. He was given Omnicef and prednisone taper for a sinus infection. Patient says he is feeling improved. He was also started on Allegra which she feels that is really helped. Overall says his breathing is improved. He denies any flare cough, wheezing or shortness of breath.  Lost his cat, has really upset him.    Allergies  Allergen Reactions  . Piroxicam Itching and Rash    Immunization History  Administered Date(s) Administered  . Influenza Split 10/17/2012, 09/16/2014, 10/15/2015  . Influenza Whole 08/17/2009, 09/11/2010, 09/17/2011  . Influenza, High Dose Seasonal PF 09/16/2016  . Influenza,inj,Quad PF,36+ Mos 09/18/2013  . Pneumococcal Conjugate-13 01/05/2014  . Pneumococcal Polysaccharide-23 11/16/2005, 11/01/2009  . Tdap 02/24/2015    Past Medical History:  Diagnosis Date  . Asthma    PFTs 06/15/05 FEV1 85% predicted ratio 68% and truncation of resp loop in a sawtooth pattern. HFA 25% 08-01-2009 >50% Nov 01, 2009>50% 01-31-2010  . Cataract   . Chronic cough   . Coronary heart disease   . Glaucoma   . Hypertension    . Obesity   . 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 Retina disorder 2014  . Ulcerative colitis     Tobacco History: History  Smoking Status  . Never Smoker  Smokeless Tobacco  . Never Used   Counseling given: Not Answered   Outpatient Encounter Prescriptions as of 07/08/2017  Medication Sig  . acetaminophen (TYLENOL) 500 MG tablet Take per bottle as needed for arthritis  . albuterol (PROVENTIL HFA;VENTOLIN HFA) 108 (90 BASE) MCG/ACT inhaler Inhale 2 puffs into the lungs every 4 (four) hours as needed for wheezing or shortness of breath (((PLAN B))).   [name] aspirin 81 MG tablet Take 81 mg by mouth daily.  [name] atorvastatin (LIPITOR) 40 MG tablet Take 40 mg by mouth daily.    . budesonide-formoterol (SYMBICORT) 160-4.5 MCG/ACT inhaler Inhale 2 puffs into the lungs 2 (two) times daily.  . carboxymethylcellulose (REFRESH PLUS) 0.5 % SOLN Use as needed for dry eye  . chlorpheniramine (CHLOR-TRIMETON) 4 MG tablet Take 4 mg by mouth every 4 (four) hours as needed.   . cholecalciferol (VITAMIN D) 1000 UNITS tablet Take 1,000 Units by mouth daily.  . cycloSPORINE (RESTASIS) 0.05 % ophthalmic emulsion Place 1 drop into both eyes 2 (two) times daily.  [name] Dextromethorphan-Guaifenesin (MUCINEX DM MAXIMUM STRENGTH) 60-1200 MG per 12 hr tablet Take 1 tablet by mouth every 12 (twelve) hours as needed (for cough or thick mucus).   [name] digoxin (LANOXIN) 0.125 MG tablet Take 0.125 mg by mouth daily.   [name]  diltiazem (DILACOR XR) 180 MG 24 hr capsule Take 180 mg by mouth daily.    [name] docusate sodium (STOOL SOFTENER) 100 MG capsule Take 100 mg by mouth daily as needed for mild constipation.   . Eyelid Cleansers (OCUSOFT EYELID CLEANSING) PADS Apply topically. Use daily  . ezetimibe (ZETIA) 10 MG tablet Take 10 mg by mouth daily.    . famotidine (PEPCID) 20 MG tablet Take 20 mg by mouth at bedtime.    . fexofenadine (ALLEGRA) 180 MG tablet Take 180 mg by mouth daily.  . fluticasone (FLONASE) 50  MCG/ACT nasal spray Place 2 sprays into both nostrils 2 (two) times daily.   . folic acid (FOLVITE) 1 MG tablet Take 1 mg by mouth daily.    [name] latanoprost (XALATAN) 0.005 % ophthalmic solution Place 1 drop into both eyes at bedtime.  [name] Lifitegrast (XIIDRA) 5 % SOLN Apply 1 drop to eye 2 (two) times daily.  . metoprolol tartrate (LOPRESSOR) 25 MG tablet Take 12.5 mg by mouth 2 (two) times daily.   . Multiple Vitamins-Minerals (CENTRUM SILVER PO) Take 1 tablet by mouth daily.  [name] omeprazole (PRILOSEC) 20 MG capsule Take 20 mg by mouth 2 (two) times daily before a meal.   . oxymetazoline (AFRIN) 0.05 % nasal spray Place 1 spray into both nostrils 2 (two) times daily as needed for congestion (for 5 days, then stop).   Vladimir Faster Glyc-Propyl Glyc PF (SYSTANE ULTRA PF) 0.4-0.3 % SOLN Apply 1 drop to eye as needed.  . Pseudoephedrine-Ibuprofen (ADVIL COLD/SINUS) 30-200 MG TABS Per bottle as needed for sinus pressure/pain  . sodium chloride (OCEAN) 0.65 % SOLN nasal spray Use as needed for nasal congestion  . Spacer/Aero-Holding Chambers (AEROCHAMBER MV) inhaler Use as instructed  . sulfaSALAzine (AZULFIDINE) 500 MG tablet Take 1,000 mg by mouth 4 (four) times daily.   . [DISCONTINUED] cefdinir (OMNICEF) 300 MG capsule Take 1 capsule (300 mg total) by mouth 2 (two) times daily. (Patient not taking: Reported on 07/08/2017)  . [DISCONTINUED] predniSONE (DELTASONE) 10 MG tablet Take  4 each am x 2 days,   2 each am x 2 days,  1 each am x 2 days and stop (Patient not taking: Reported on 07/08/2017)  . [DISCONTINUED] predniSONE (DELTASONE) 10 MG tablet 4 tabs for 2 days, then 3 tabs for 2 days, 2 tabs for 2 days, then 1 tab for 2 days, then stop (Patient not taking: Reported on 07/08/2017)   No facility-administered encounter medications on file as of 07/08/2017.      Review of Systems  Constitutional:   No  weight loss, night sweats,  Fevers, chills, fatigue, or  lassitude.  HEENT:   No headaches,   Difficulty swallowing,  Tooth/dental problems, or  Sore throat,                No sneezing, itching, ear ache, + nasal congestion, post nasal drip,   CV:  No chest pain,  Orthopnea, PND, swelling in lower extremities, anasarca, dizziness, palpitations, syncope.   GI  No heartburn, indigestion, abdominal pain, nausea, vomiting, diarrhea, change in bowel habits, loss of appetite, bloody stools.   Resp:   No wheezing.  No chest wall deformity  Skin: no rash or lesions.  GU: no dysuria, change in color of urine, no urgency or frequency.  No flank pain, no hematuria   MS:  No joint pain or swelling.  No decreased range of motion.  No back pain.    Physical Exam  BP 134/74 (BP Location: Left Arm, Cuff Size: Normal)   Pulse 74   Ht 5\' 5"  (1.651 m)   Wt 227 lb 9.6 oz (103.2 kg)   SpO2 94%   BMI 37.87 kg/m   GEN: A/Ox3; pleasant , NAD, obese    HEENT:  /AT,  EACs-clear, TMs-wnl, NOSE-clear, THROAT-clear, no lesions, no postnasal drip or exudate noted.   NECK:  Supple w/ fair ROM; no JVD; normal carotid impulses w/o bruits; no thyromegaly or nodules palpated; no lymphadenopathy.    RESP  Clear  P & A; w/o, wheezes/ rales/ or rhonchi. no accessory muscle use, no dullness to percussion  CARD:  RRR, no m/r/g, no peripheral edema, pulses intact, no cyanosis or clubbing.  GI:   Soft & nt; nml bowel sounds; no organomegaly or masses detected.   Musco: Warm bil, no deformities or joint swelling noted.   Neuro: alert, no focal deficits noted.    Skin: Warm, no lesions or rashes    Lab Results:    BNP No results found for: BNP  ProBNP No results found for: PROBNP  Imaging: No results found.   Assessment & Plan:   Asthma, chronic Doing well on current regimen   Plan  Patient Instructions  Mucinex DM Twice daily  As needed  Cough/congestion  Saline nasal rinses As needed  Continue Allegra 180mg  daily in am.  Continue on Symbicort Twice daily  . Follow with up 4  months and As needed   Please contact office for sooner follow up if symptoms do not improve or worsen or seek emergency care      Chronic rhinitis Improved control on Allegra   Plan  Patient Instructions  Mucinex DM Twice daily  As needed  Cough/congestion  Saline nasal rinses As needed  Continue Allegra 180mg  daily in am.  Continue on Symbicort Twice daily  . Follow with up 4 months and As needed   Please contact office for sooner follow up if symptoms do not improve or worsen or seek emergency care         Rexene Edison, NP 07/08/2017

## 2017-07-08 NOTE — Patient Instructions (Signed)
Mucinex DM Twice daily  As needed  Cough/congestion  Saline nasal rinses As needed  Continue Allegra 180mg  daily in am.  Continue on Symbicort Twice daily  . Follow with up 4 months and As needed   Please contact office for sooner follow up if symptoms do not improve or worsen or seek emergency care

## 2017-07-08 NOTE — Assessment & Plan Note (Signed)
Improved control on Allegra   Plan  Patient Instructions  Mucinex DM Twice daily  As needed  Cough/congestion  Saline nasal rinses As needed  Continue Allegra 180mg  daily in am.  Continue on Symbicort Twice daily  . Follow with up 4 months and As needed   Please contact office for sooner follow up if symptoms do not improve or worsen or seek emergency care

## 2017-07-08 NOTE — Progress Notes (Signed)
Chart and office note reviewed in detail  > agree with a/p as outlined    

## 2017-07-08 NOTE — Assessment & Plan Note (Signed)
Doing well on current regimen   Plan  Patient Instructions  Mucinex DM Twice daily  As needed  Cough/congestion  Saline nasal rinses As needed  Continue Allegra 180mg  daily in am.  Continue on Symbicort Twice daily  . Follow with up 4 months and As needed   Please contact office for sooner follow up if symptoms do not improve or worsen or seek emergency care

## 2017-07-10 DIAGNOSIS — Z0000 Encounter for general adult medical examination without abnormal findings: Secondary | ICD-10-CM | POA: Diagnosis not present

## 2017-07-10 DIAGNOSIS — Z1389 Encounter for screening for other disorder: Secondary | ICD-10-CM | POA: Diagnosis not present

## 2017-07-15 DIAGNOSIS — H6123 Impacted cerumen, bilateral: Secondary | ICD-10-CM | POA: Diagnosis not present

## 2017-07-22 DIAGNOSIS — E668 Other obesity: Secondary | ICD-10-CM | POA: Diagnosis not present

## 2017-07-22 DIAGNOSIS — E785 Hyperlipidemia, unspecified: Secondary | ICD-10-CM | POA: Diagnosis not present

## 2017-07-22 DIAGNOSIS — I10 Essential (primary) hypertension: Secondary | ICD-10-CM | POA: Diagnosis not present

## 2017-07-22 DIAGNOSIS — I252 Old myocardial infarction: Secondary | ICD-10-CM | POA: Diagnosis not present

## 2017-07-22 DIAGNOSIS — I2510 Atherosclerotic heart disease of native coronary artery without angina pectoris: Secondary | ICD-10-CM | POA: Diagnosis not present

## 2017-08-08 DIAGNOSIS — Z6839 Body mass index (BMI) 39.0-39.9, adult: Secondary | ICD-10-CM | POA: Diagnosis not present

## 2017-08-08 DIAGNOSIS — Z125 Encounter for screening for malignant neoplasm of prostate: Secondary | ICD-10-CM | POA: Diagnosis not present

## 2017-08-08 DIAGNOSIS — Z79899 Other long term (current) drug therapy: Secondary | ICD-10-CM | POA: Diagnosis not present

## 2017-08-08 DIAGNOSIS — I252 Old myocardial infarction: Secondary | ICD-10-CM | POA: Diagnosis not present

## 2017-08-08 DIAGNOSIS — M1990 Unspecified osteoarthritis, unspecified site: Secondary | ICD-10-CM | POA: Diagnosis not present

## 2017-08-08 DIAGNOSIS — I10 Essential (primary) hypertension: Secondary | ICD-10-CM | POA: Diagnosis not present

## 2017-08-08 DIAGNOSIS — E7800 Pure hypercholesterolemia, unspecified: Secondary | ICD-10-CM | POA: Diagnosis not present

## 2017-08-08 DIAGNOSIS — K5100 Ulcerative (chronic) pancolitis without complications: Secondary | ICD-10-CM | POA: Diagnosis not present

## 2017-08-08 DIAGNOSIS — I2510 Atherosclerotic heart disease of native coronary artery without angina pectoris: Secondary | ICD-10-CM | POA: Diagnosis not present

## 2017-08-08 DIAGNOSIS — E669 Obesity, unspecified: Secondary | ICD-10-CM | POA: Diagnosis not present

## 2017-08-13 DIAGNOSIS — K5100 Ulcerative (chronic) pancolitis without complications: Secondary | ICD-10-CM | POA: Diagnosis not present

## 2017-08-13 DIAGNOSIS — Z86010 Personal history of colonic polyps: Secondary | ICD-10-CM | POA: Diagnosis not present

## 2017-08-14 ENCOUNTER — Ambulatory Visit (INDEPENDENT_AMBULATORY_CARE_PROVIDER_SITE_OTHER): Payer: Medicare Other | Admitting: Ophthalmology

## 2017-08-14 DIAGNOSIS — H35373 Puckering of macula, bilateral: Secondary | ICD-10-CM

## 2017-08-14 DIAGNOSIS — H353111 Nonexudative age-related macular degeneration, right eye, early dry stage: Secondary | ICD-10-CM

## 2017-08-14 DIAGNOSIS — H43813 Vitreous degeneration, bilateral: Secondary | ICD-10-CM

## 2017-08-14 DIAGNOSIS — H35341 Macular cyst, hole, or pseudohole, right eye: Secondary | ICD-10-CM | POA: Diagnosis not present

## 2017-09-23 DIAGNOSIS — H402232 Chronic angle-closure glaucoma, bilateral, moderate stage: Secondary | ICD-10-CM | POA: Diagnosis not present

## 2017-09-23 DIAGNOSIS — H40053 Ocular hypertension, bilateral: Secondary | ICD-10-CM | POA: Diagnosis not present

## 2017-09-23 DIAGNOSIS — H1013 Acute atopic conjunctivitis, bilateral: Secondary | ICD-10-CM | POA: Diagnosis not present

## 2017-09-23 DIAGNOSIS — H04123 Dry eye syndrome of bilateral lacrimal glands: Secondary | ICD-10-CM | POA: Diagnosis not present

## 2017-09-25 DIAGNOSIS — Z23 Encounter for immunization: Secondary | ICD-10-CM | POA: Diagnosis not present

## 2017-11-11 ENCOUNTER — Ambulatory Visit (INDEPENDENT_AMBULATORY_CARE_PROVIDER_SITE_OTHER): Payer: Medicare Other | Admitting: Adult Health

## 2017-11-11 ENCOUNTER — Encounter: Payer: Self-pay | Admitting: Adult Health

## 2017-11-11 DIAGNOSIS — J4520 Mild intermittent asthma, uncomplicated: Secondary | ICD-10-CM

## 2017-11-11 DIAGNOSIS — J310 Chronic rhinitis: Secondary | ICD-10-CM | POA: Diagnosis not present

## 2017-11-11 NOTE — Assessment & Plan Note (Signed)
Controlled on current regimen no changes 

## 2017-11-11 NOTE — Patient Instructions (Addendum)
Mucinex DM Twice daily  As needed  Cough/congestion  Saline nasal rinses As needed  Continue Allegra 180mg  daily in am.  Continue on Symbicort Twice daily , rinse after use.  Follow with up 4 months and As needed   Please contact office for sooner follow up if symptoms do not improve or worsen or seek emergency care

## 2017-11-11 NOTE — Assessment & Plan Note (Signed)
Well-controlled on current regimen with no recent steroid use for the last 6 months.  Plan Patient Instructions  Mucinex DM Twice daily  As needed  Cough/congestion  Saline nasal rinses As needed  Continue Allegra 180mg  daily in am.  Continue on Symbicort Twice daily , rinse after use.  Follow with up 4 months and As needed   Please contact office for sooner follow up if symptoms do not improve or worsen or seek emergency care

## 2017-11-11 NOTE — Progress Notes (Signed)
Chart and office note reviewed in detail  > agree with a/p as outlined    

## 2017-11-11 NOTE — Progress Notes (Signed)
@Patient  ID: Barry [name], male    DOB: [birth year], 71 y.o.   MRN: [id number]  Chief Complaint  Patient presents with  . Follow-up    Asthma     Referring provider: No ref. provider found  HPI: 6 yowm never smoker with relatively mild asthma but superimposed on obesity plus a chronically paralyzed left hemidiaphragm and a tendency to upper airway cough syndrome.  Has Ulcerative Colitis -Followed by Dr. Wynetta Emery- on SulfaSalazine .  TEST  PFTs 06/15/2005 FEV1 85% predicted ratio 68% and truncation of respiratory loop in a sawtooth pattern  03/24/12 CT Sinus >Negative paranasal sinuses - Sinus CT 08/07/2013>>Short air-fluid levels in the maxillary sinuses bilaterally suggesting early acute sinusitis PVX 2016  Prevnar 2015   11/11/2017 Follow up: Asthma /Chronic Rhinitis  Patient returns for a 85-month follow-up.  Patient says overall since last visit he has been doing well.  He had no flareup of his shortness of breath or wheezing.  Sinus congestion has been controlled. He does have a intermittent minimally productive cough he denies any fever or discolored mucus chest pain orthopnea PND or leg swelling..  Flu Pneumovax and Prevnar vaccines are up-to-date He had no recent antibiotics or steroid use for the last 6 months..    Allergies  Allergen Reactions  . Piroxicam Itching and Rash    Immunization History  Administered Date(s) Administered  . Influenza Split 10/17/2012, 09/16/2014, 10/15/2015  . Influenza Whole 08/17/2009, 09/11/2010, 09/17/2011  . Influenza, High Dose Seasonal PF 09/16/2016  . Influenza,inj,Quad PF,6+ Mos 09/18/2013  . Pneumococcal Conjugate-13 01/05/2014  . Pneumococcal Polysaccharide-23 11/16/2005, 11/01/2009  . Tdap 02/24/2015    Past Medical History:  Diagnosis Date  . Asthma    PFTs 06/15/05 FEV1 85% predicted ratio 68% and truncation of resp loop in a sawtooth pattern. HFA 25% 08-01-2009 >50% Nov 01, 2009>50% 01-31-2010  . Cataract   . Chronic  cough   . Coronary heart disease   . Glaucoma   . Hypertension   . Obesity   . 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 Retina disorder 2014  . Ulcerative colitis     Tobacco History: Social History   Tobacco Use  Smoking Status Never Smoker  Smokeless Tobacco Never Used   Counseling given: Not Answered   Outpatient Encounter Medications as of 11/11/2017  Medication Sig  . acetaminophen (TYLENOL) 500 MG tablet Take per bottle as needed for arthritis  . albuterol (PROVENTIL HFA;VENTOLIN HFA) 108 (90 BASE) MCG/ACT inhaler Inhale 2 puffs into the lungs every 4 (four) hours as needed for wheezing or shortness of breath (((PLAN B))).   [name] aspirin 81 MG tablet Take 81 mg by mouth daily.  [name] atorvastatin (LIPITOR) 40 MG tablet Take 40 mg by mouth daily.    . budesonide-formoterol (SYMBICORT) 160-4.5 MCG/ACT inhaler Inhale 2 puffs into the lungs 2 (two) times daily.  . carboxymethylcellulose (REFRESH PLUS) 0.5 % SOLN Use as needed for dry eye  . chlorpheniramine (CHLOR-TRIMETON) 4 MG tablet Take 4 mg by mouth every 4 (four) hours as needed.   . cholecalciferol (VITAMIN D) 1000 UNITS tablet Take 1,000 Units by mouth daily.  . cycloSPORINE (RESTASIS) 0.05 % ophthalmic emulsion Place 1 drop into both eyes 2 (two) times daily.  [name] Dextromethorphan-Guaifenesin (MUCINEX DM MAXIMUM STRENGTH) 60-1200 MG per 12 hr tablet Take 1 tablet by mouth every 12 (twelve) hours as needed (for cough or thick mucus).   [name] digoxin (LANOXIN) 0.125 MG tablet Take 0.125  mg by mouth daily.   [name] diltiazem (DILACOR XR) 180 MG 24 hr capsule Take 180 mg by mouth daily.    [name] docusate sodium (STOOL SOFTENER) 100 MG capsule Take 100 mg by mouth daily as needed for mild constipation.   . Eyelid Cleansers (OCUSOFT EYELID CLEANSING) PADS Apply topically. Use daily  . ezetimibe (ZETIA) 10 MG tablet Take 10 mg by mouth daily.    . famotidine (PEPCID) 20 MG tablet Take 20 mg by mouth at bedtime.    . fexofenadine  (ALLEGRA) 180 MG tablet Take 180 mg by mouth daily.  . fluticasone (FLONASE) 50 MCG/ACT nasal spray Place 2 sprays into both nostrils 2 (two) times daily.   . folic acid (FOLVITE) 1 MG tablet Take 1 mg by mouth daily.    [name] latanoprost (XALATAN) 0.005 % ophthalmic solution Place 1 drop into both eyes at bedtime.  [name] Lifitegrast (XIIDRA) 5 % SOLN Apply 1 drop to eye 2 (two) times daily.  . metoprolol tartrate (LOPRESSOR) 25 MG tablet Take 12.5 mg by mouth 2 (two) times daily.   . Multiple Vitamins-Minerals (CENTRUM SILVER PO) Take 1 tablet by mouth daily.  [name] omeprazole (PRILOSEC) 20 MG capsule Take 20 mg by mouth 2 (two) times daily before a meal.   . oxymetazoline (AFRIN) 0.05 % nasal spray Place 1 spray into both nostrils 2 (two) times daily as needed for congestion (for 5 days, then stop).   Vladimir Faster Glyc-Propyl Glyc PF (SYSTANE ULTRA PF) 0.4-0.3 % SOLN Apply 1 drop to eye as needed.  . Pseudoephedrine-Ibuprofen (ADVIL COLD/SINUS) 30-200 MG TABS Per bottle as needed for sinus pressure/pain  . sodium chloride (OCEAN) 0.65 % SOLN nasal spray Use as needed for nasal congestion  . Spacer/Aero-Holding Chambers (AEROCHAMBER MV) inhaler Use as instructed  . sulfaSALAzine (AZULFIDINE) 500 MG tablet Take 1,000 mg by mouth 4 (four) times daily.    No facility-administered encounter medications on file as of 11/11/2017.      Review of Systems  Constitutional:   No  weight loss, night sweats,  Fevers, chills, fatigue, or  lassitude.  HEENT:   No headaches,  Difficulty swallowing,  Tooth/dental problems, or  Sore throat,                No sneezing, itching, ear ache +, nasal congestion, post nasal drip,   CV:  No chest pain,  Orthopnea, PND, swelling in lower extremities, anasarca, dizziness, palpitations, syncope.   GI  No heartburn, indigestion, abdominal pain, nausea, vomiting, diarrhea, change in bowel habits, loss of appetite, bloody stools.   Resp:   No chest wall deformity  Skin: no  rash or lesions.  GU: no dysuria, change in color of urine, no urgency or frequency.  No flank pain, no hematuria   MS:  No joint pain or swelling.  No decreased range of motion.  No back pain.    Physical Exam  There were no vitals taken for this visit.  GEN: A/Ox3; pleasant , NAD, elderly    HEENT:  [place]/AT,  EACs-clear, TMs-wnl, NOSE-clear, THROAT-clear, no lesions, no postnasal drip or exudate noted.   NECK:  Supple w/ fair ROM; no JVD; normal carotid impulses w/o bruits; no thyromegaly or nodules palpated; no lymphadenopathy.    RESP  Clear  P & A; w/o, wheezes/ rales/ or rhonchi. no accessory muscle use, no dullness to percussion  CARD:  RRR, no m/r/g, no peripheral edema, pulses intact, no cyanosis or clubbing.  GI:   Soft &  nt; nml bowel sounds; no organomegaly or masses detected.   Musco: Warm bil, leg braces, cane   Neuro: alert, no focal deficits noted.    Skin: Warm, no lesions or rashes    Lab Results:   BMET  BNP No results found for: BNP  ProBNP No results found for: PROBNP  Imaging: No results found.   Assessment & Plan:   Asthma, chronic Well-controlled on current regimen with no recent steroid use for the last 6 months.  Plan Patient Instructions  Mucinex DM Twice daily  As needed  Cough/congestion  Saline nasal rinses As needed  Continue Allegra 180mg  daily in am.  Continue on Symbicort Twice daily , rinse after use.  Follow with up 4 months and As needed   Please contact office for sooner follow up if symptoms do not improve or worsen or seek emergency care      Chronic rhinitis Controlled on current regimen no changes     Rexene Edison, NP 11/11/2017

## 2017-11-12 NOTE — Addendum Note (Signed)
Addended by: Parke Poisson E on: 11/12/2017 09:14 AM   Modules accepted: Orders

## 2017-11-19 ENCOUNTER — Other Ambulatory Visit: Payer: Self-pay | Admitting: Internal Medicine

## 2017-12-18 DIAGNOSIS — M25571 Pain in right ankle and joints of right foot: Secondary | ICD-10-CM | POA: Diagnosis not present

## 2018-01-03 ENCOUNTER — Other Ambulatory Visit: Payer: Self-pay | Admitting: Adult Health

## 2018-01-23 DIAGNOSIS — Z86010 Personal history of colonic polyps: Secondary | ICD-10-CM | POA: Diagnosis not present

## 2018-01-23 DIAGNOSIS — K219 Gastro-esophageal reflux disease without esophagitis: Secondary | ICD-10-CM | POA: Diagnosis not present

## 2018-01-23 DIAGNOSIS — K5100 Ulcerative (chronic) pancolitis without complications: Secondary | ICD-10-CM | POA: Diagnosis not present

## 2018-01-27 DIAGNOSIS — I10 Essential (primary) hypertension: Secondary | ICD-10-CM | POA: Diagnosis not present

## 2018-01-27 DIAGNOSIS — I2510 Atherosclerotic heart disease of native coronary artery without angina pectoris: Secondary | ICD-10-CM | POA: Diagnosis not present

## 2018-01-27 DIAGNOSIS — E7849 Other hyperlipidemia: Secondary | ICD-10-CM | POA: Diagnosis not present

## 2018-01-27 DIAGNOSIS — H04123 Dry eye syndrome of bilateral lacrimal glands: Secondary | ICD-10-CM | POA: Diagnosis not present

## 2018-01-27 DIAGNOSIS — H402232 Chronic angle-closure glaucoma, bilateral, moderate stage: Secondary | ICD-10-CM | POA: Diagnosis not present

## 2018-01-27 DIAGNOSIS — H40053 Ocular hypertension, bilateral: Secondary | ICD-10-CM | POA: Diagnosis not present

## 2018-01-27 DIAGNOSIS — H1013 Acute atopic conjunctivitis, bilateral: Secondary | ICD-10-CM | POA: Diagnosis not present

## 2018-01-27 DIAGNOSIS — I252 Old myocardial infarction: Secondary | ICD-10-CM | POA: Diagnosis not present

## 2018-01-27 DIAGNOSIS — E785 Hyperlipidemia, unspecified: Secondary | ICD-10-CM | POA: Diagnosis not present

## 2018-01-27 DIAGNOSIS — E668 Other obesity: Secondary | ICD-10-CM | POA: Diagnosis not present

## 2018-02-17 DIAGNOSIS — Z6841 Body Mass Index (BMI) 40.0 and over, adult: Secondary | ICD-10-CM | POA: Diagnosis not present

## 2018-02-17 DIAGNOSIS — K219 Gastro-esophageal reflux disease without esophagitis: Secondary | ICD-10-CM | POA: Diagnosis not present

## 2018-02-17 DIAGNOSIS — K5100 Ulcerative (chronic) pancolitis without complications: Secondary | ICD-10-CM | POA: Diagnosis not present

## 2018-02-17 DIAGNOSIS — J45909 Unspecified asthma, uncomplicated: Secondary | ICD-10-CM | POA: Diagnosis not present

## 2018-02-17 DIAGNOSIS — I2510 Atherosclerotic heart disease of native coronary artery without angina pectoris: Secondary | ICD-10-CM | POA: Diagnosis not present

## 2018-02-17 DIAGNOSIS — M1990 Unspecified osteoarthritis, unspecified site: Secondary | ICD-10-CM | POA: Diagnosis not present

## 2018-02-17 DIAGNOSIS — I252 Old myocardial infarction: Secondary | ICD-10-CM | POA: Diagnosis not present

## 2018-02-17 DIAGNOSIS — E7800 Pure hypercholesterolemia, unspecified: Secondary | ICD-10-CM | POA: Diagnosis not present

## 2018-02-17 DIAGNOSIS — I10 Essential (primary) hypertension: Secondary | ICD-10-CM | POA: Diagnosis not present

## 2018-02-17 DIAGNOSIS — Z1389 Encounter for screening for other disorder: Secondary | ICD-10-CM | POA: Diagnosis not present

## 2018-03-12 ENCOUNTER — Encounter: Payer: Self-pay | Admitting: Adult Health

## 2018-03-12 ENCOUNTER — Ambulatory Visit (INDEPENDENT_AMBULATORY_CARE_PROVIDER_SITE_OTHER): Payer: Medicare Other | Admitting: Adult Health

## 2018-03-12 DIAGNOSIS — J4530 Mild persistent asthma, uncomplicated: Secondary | ICD-10-CM | POA: Diagnosis not present

## 2018-03-12 DIAGNOSIS — J310 Chronic rhinitis: Secondary | ICD-10-CM

## 2018-03-12 MED ORDER — ALBUTEROL SULFATE HFA 108 (90 BASE) MCG/ACT IN AERS: 2.0000 | INHALATION_SPRAY | RESPIRATORY_TRACT | 5 refills | Status: DC | PRN

## 2018-03-12 MED ORDER — BUDESONIDE-FORMOTEROL FUMARATE 160-4.5 MCG/ACT IN AERO: INHALATION_SPRAY | RESPIRATORY_TRACT | 5 refills | Status: DC

## 2018-03-12 NOTE — Progress Notes (Signed)
Chart and office note reviewed in detail  > agree with a/p as outlined    

## 2018-03-12 NOTE — Progress Notes (Signed)
@Patient  ID: Barry [name], male    DOB: 09/09/1946, 72 y.o.   MRN: [id number]  Chief Complaint  Patient presents with  . Follow-up    Asthma    Referring provider: [name], MD  HPI: 49 yowm never smoker with relatively mild asthma but superimposed on obesity plus a chronically paralyzed left hemidiaphragm and a tendency to upper airway cough syndrome.  Has Ulcerative Colitis -Followed by Barry [name]- on SulfaSalazine .   TEST  PFTs 06/15/2005 FEV1 85% predicted ratio 68% and truncation of respiratory loop in a sawtooth pattern  03/24/12 CT Sinus >Negative paranasal sinuses - Sinus CT 08/07/2013>>Short air-fluid levels in the maxillary sinuses bilaterally suggesting early acute sinusitis PVX 2016  Prevnar 2015  03/12/2018 Follow up : Asthma /CR  Pt returns for 4 month follow up. Says breathing has been doing well with no flare of cough or wheezing . No steroid use for last year. Rare SABA use.   Allergy symptoms controlled on Allegra and flonase .   GERD controlled on PPI/Pecpcid .   Working with PCP Barry [name] for b/p as has been elevated.   Has a friend that checks on him weekly. Not much family .  Has 72 year old cat that keeps him company .   Allergies  Allergen Reactions  . Piroxicam Itching and Rash    Immunization History  Administered Date(s) Administered  . Influenza Split 10/17/2012, 09/16/2014, 10/15/2015  . Influenza Whole 08/17/2009, 09/11/2010, 09/17/2011  . Influenza, High Dose Seasonal PF 09/16/2016, 09/16/2017  . Influenza,inj,Quad PF,6+ Mos 09/18/2013  . Pneumococcal Conjugate-13 01/05/2014  . Pneumococcal Polysaccharide-23 11/16/2005, 11/01/2009  . Tdap 02/24/2015    Past Medical History:  Diagnosis Date  . Asthma    PFTs 06/15/05 FEV1 85% predicted ratio 68% and truncation of resp loop in a sawtooth pattern. HFA 25% 08-01-2009 >50% Nov 01, 2009>50% 01-31-2010  . Cataract   . Chronic cough   . Coronary heart disease   . Glaucoma   .  Hypertension   . Obesity   . 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 Retina disorder 2014  . Ulcerative colitis     Tobacco History: Social History   Tobacco Use  Smoking Status Never Smoker  Smokeless Tobacco Never Used   Counseling given: Not Answered   Outpatient Encounter Medications as of 03/12/2018  Medication Sig  . acetaminophen (TYLENOL) 500 MG tablet Take per bottle as needed for arthritis  . albuterol (PROVENTIL HFA;VENTOLIN HFA) 108 (90 Base) MCG/ACT inhaler Inhale 2 puffs into the lungs every 4 (four) hours as needed for wheezing or shortness of breath (((PLAN B))).  [name] aspirin 81 MG tablet Take 81 mg by mouth daily.  [name] atorvastatin (LIPITOR) 40 MG tablet Take 40 mg by mouth daily.    . carboxymethylcellulose (REFRESH PLUS) 0.5 % SOLN Use as needed for dry eye  . cholecalciferol (VITAMIN D) 1000 UNITS tablet Take 1,000 Units by mouth daily.  . cycloSPORINE (RESTASIS) 0.05 % ophthalmic emulsion Place 1 drop into both eyes 2 (two) times daily.  [name] Dextromethorphan-Guaifenesin (MUCINEX DM MAXIMUM STRENGTH) 60-1200 MG per 12 hr tablet Take 1 tablet by mouth every 12 (twelve) hours as needed (for cough or thick mucus).   [name] diltiazem (DILACOR XR) 240 MG 24 hr capsule Take 240 mg by mouth daily.   [name] docusate sodium (STOOL SOFTENER) 100 MG capsule Take 100 mg by mouth daily as needed for mild constipation.   . Eyelid Cleansers (OCUSOFT  EYELID CLEANSING) PADS Apply topically. Use daily  . ezetimibe (ZETIA) 10 MG tablet Take 10 mg by mouth daily.    . famotidine (PEPCID) 20 MG tablet Take 20 mg by mouth at bedtime.    . fexofenadine (ALLEGRA) 180 MG tablet Take 180 mg by mouth daily.  . fluticasone (FLONASE) 50 MCG/ACT nasal spray Place 2 sprays into both nostrils 2 (two) times daily.   [name] latanoprost (XALATAN) 0.005 % ophthalmic solution Place 1 drop into both eyes at bedtime.  [name] Lifitegrast (XIIDRA) 5 % SOLN Apply 1 drop to eye 2 (two) times daily.  . metoprolol  tartrate (LOPRESSOR) 25 MG tablet Take 12.5 mg by mouth 2 (two) times daily.   . Multiple Vitamins-Minerals (CENTRUM SILVER PO) Take 1 tablet by mouth daily.  [name] omeprazole (PRILOSEC) 20 MG capsule Take 20 mg by mouth 2 (two) times daily before a meal.   . oxymetazoline (AFRIN) 0.05 % nasal spray Place 1 spray into both nostrils 2 (two) times daily as needed for congestion (for 5 days, then stop).   Vladimir Faster Glyc-Propyl Glyc PF (SYSTANE ULTRA PF) 0.4-0.3 % SOLN Apply 1 drop to eye as needed.  . Pseudoephedrine-Ibuprofen (ADVIL COLD/SINUS) 30-200 MG TABS Per bottle as needed for sinus pressure/pain  . sodium chloride (OCEAN) 0.65 % SOLN nasal spray Use as needed for nasal congestion  . Spacer/Aero-Holding Chambers (AEROCHAMBER MV) inhaler Use as instructed  . sulfaSALAzine (AZULFIDINE) 500 MG tablet Take 1,000 mg by mouth 4 (four) times daily.   . SYMBICORT 160-4.5 MCG/ACT inhaler INHALE 2 PUFFS INTO THE LUNGS 2 (TWO) TIMES DAILY.  . [DISCONTINUED] albuterol (PROVENTIL HFA;VENTOLIN HFA) 108 (90 BASE) MCG/ACT inhaler Inhale 2 puffs into the lungs every 4 (four) hours as needed for wheezing or shortness of breath (((PLAN B))).   . [DISCONTINUED] albuterol (PROVENTIL HFA;VENTOLIN HFA) 108 (90 Base) MCG/ACT inhaler INHALE 2 PUFFS INTO THE LUNGS EVERY 4 HOURS AS NEEDED FOR WHEEZING OR SHORTNESS OF BREATH.  . [DISCONTINUED] folic acid (FOLVITE) 1 MG tablet Take 1 mg by mouth daily.     No facility-administered encounter medications on file as of 03/12/2018.      Review of Systems  Constitutional:   No  weight loss, night sweats,  Fevers, chills, fatigue, or  lassitude.  HEENT:   No headaches,  Difficulty swallowing,  Tooth/dental problems, or  Sore throat,                No sneezing, itching, ear ache, + nasal congestion, post nasal drip,   CV:  No chest pain,  Orthopnea, PND, swelling in lower extremities, anasarca, dizziness, palpitations, syncope.   GI  No heartburn, indigestion, abdominal  pain, nausea, vomiting, diarrhea, change in bowel habits, loss of appetite, bloody stools.   Resp:  .  No chest wall deformity  Skin: no rash or lesions.  GU: no dysuria, change in color of urine, no urgency or frequency.  No flank pain, no hematuria   MS:  No joint pain or swelling.  No decreased range of motion.  No back pain.    Physical Exam  Pulse 92   Ht 5\' 3"  (1.6 m)   Wt 232 lb (105.2 kg)   SpO2 94%   BMI 41.10 kg/m   GEN: A/Ox3; pleasant , NAD, obese    HEENT:  [place]/AT,  EACs-clear, TMs-wnl, NOSE-clear drainage , THROAT-clear, no lesions, no postnasal drip or exudate noted. Class 3 MP airway   NECK:  Supple w/ fair ROM; no JVD;  normal carotid impulses w/o bruits; no thyromegaly or nodules palpated; no lymphadenopathy.    RESP  Clear  P & A; w/o, wheezes/ rales/ or rhonchi. no accessory muscle use, no dullness to percussion  CARD:  RRR, no m/r/g, no peripheral edema, pulses intact, no cyanosis or clubbing.  GI:   Soft & nt; nml bowel sounds; no organomegaly or masses detected.   Musco: Warm bil, no deformities or joint swelling noted. Chronic foot deformity   Neuro: alert, no focal deficits noted.    Skin: Warm, no lesions or rashes    Lab Results:  CBC  BNP No results found for: BNP  ProBNP No results found for: PROBNP  Imaging: No results found.   Assessment & Plan:   Asthma, chronic Well controlled   Plan  Patient Instructions  Mucinex DM Twice daily  As needed  Cough/congestion  Saline nasal rinses As needed  Continue Allegra 180mg  daily in am.  Continue on Symbicort Twice daily , rinse after use.  Continue on Flonase .  Follow with up 6  months and As needed   Please contact office for sooner follow up if symptoms do not improve or worsen or seek emergency care       Chronic rhinitis Controlled   Plan Patient Instructions  Mucinex DM Twice daily  As needed  Cough/congestion  Saline nasal rinses As needed  Continue Allegra  180mg  daily in am.  Continue on Symbicort Twice daily , rinse after use.  Continue on Flonase .  Follow with up 6  months and As needed   Please contact office for sooner follow up if symptoms do not improve or worsen or seek emergency care          Rexene Edison, NP 03/12/2018

## 2018-03-12 NOTE — Assessment & Plan Note (Signed)
Well controlled   Plan  Patient Instructions  Mucinex DM Twice daily  As needed  Cough/congestion  Saline nasal rinses As needed  Continue Allegra 180mg  daily in am.  Continue on Symbicort Twice daily , rinse after use.  Continue on Flonase .  Follow with up 6  months and As needed   Please contact office for sooner follow up if symptoms do not improve or worsen or seek emergency care

## 2018-03-12 NOTE — Patient Instructions (Addendum)
Mucinex DM Twice daily  As needed  Cough/congestion  Saline nasal rinses As needed  Continue Allegra 180mg  daily in am.  Continue on Symbicort Twice daily , rinse after use.  Continue on Flonase .  Follow with up 4 months and As needed   Please contact office for sooner follow up if symptoms do not improve or worsen or seek emergency care

## 2018-03-12 NOTE — Assessment & Plan Note (Signed)
Controlled   Plan Patient Instructions  Mucinex DM Twice daily  As needed  Cough/congestion  Saline nasal rinses As needed  Continue Allegra 180mg  daily in am.  Continue on Symbicort Twice daily , rinse after use.  Continue on Flonase .  Follow with up 6  months and As needed   Please contact office for sooner follow up if symptoms do not improve or worsen or seek emergency care

## 2018-05-02 DIAGNOSIS — H35373 Puckering of macula, bilateral: Secondary | ICD-10-CM | POA: Diagnosis not present

## 2018-05-02 DIAGNOSIS — H402232 Chronic angle-closure glaucoma, bilateral, moderate stage: Secondary | ICD-10-CM | POA: Diagnosis not present

## 2018-05-02 DIAGNOSIS — H35033 Hypertensive retinopathy, bilateral: Secondary | ICD-10-CM | POA: Diagnosis not present

## 2018-05-02 DIAGNOSIS — Z961 Presence of intraocular lens: Secondary | ICD-10-CM | POA: Diagnosis not present

## 2018-07-21 DIAGNOSIS — K5100 Ulcerative (chronic) pancolitis without complications: Secondary | ICD-10-CM | POA: Diagnosis not present

## 2018-07-21 DIAGNOSIS — K219 Gastro-esophageal reflux disease without esophagitis: Secondary | ICD-10-CM | POA: Diagnosis not present

## 2018-07-21 DIAGNOSIS — Z86010 Personal history of colonic polyps: Secondary | ICD-10-CM | POA: Diagnosis not present

## 2018-08-08 DIAGNOSIS — H1013 Acute atopic conjunctivitis, bilateral: Secondary | ICD-10-CM | POA: Diagnosis not present

## 2018-08-08 DIAGNOSIS — H402232 Chronic angle-closure glaucoma, bilateral, moderate stage: Secondary | ICD-10-CM | POA: Diagnosis not present

## 2018-08-08 DIAGNOSIS — H40053 Ocular hypertension, bilateral: Secondary | ICD-10-CM | POA: Diagnosis not present

## 2018-08-08 DIAGNOSIS — H04123 Dry eye syndrome of bilateral lacrimal glands: Secondary | ICD-10-CM | POA: Diagnosis not present

## 2018-08-20 ENCOUNTER — Encounter (INDEPENDENT_AMBULATORY_CARE_PROVIDER_SITE_OTHER): Payer: Medicare Other | Admitting: Ophthalmology

## 2018-08-20 DIAGNOSIS — H35033 Hypertensive retinopathy, bilateral: Secondary | ICD-10-CM | POA: Diagnosis not present

## 2018-08-20 DIAGNOSIS — I10 Essential (primary) hypertension: Secondary | ICD-10-CM | POA: Diagnosis not present

## 2018-08-20 DIAGNOSIS — H43813 Vitreous degeneration, bilateral: Secondary | ICD-10-CM | POA: Diagnosis not present

## 2018-08-20 DIAGNOSIS — H353112 Nonexudative age-related macular degeneration, right eye, intermediate dry stage: Secondary | ICD-10-CM | POA: Diagnosis not present

## 2018-08-20 DIAGNOSIS — H35341 Macular cyst, hole, or pseudohole, right eye: Secondary | ICD-10-CM | POA: Diagnosis not present

## 2018-08-20 DIAGNOSIS — H35373 Puckering of macula, bilateral: Secondary | ICD-10-CM | POA: Diagnosis not present

## 2018-09-05 ENCOUNTER — Other Ambulatory Visit: Payer: Self-pay | Admitting: Adult Health

## 2018-09-10 DIAGNOSIS — Z23 Encounter for immunization: Secondary | ICD-10-CM | POA: Diagnosis not present

## 2018-09-12 ENCOUNTER — Ambulatory Visit (INDEPENDENT_AMBULATORY_CARE_PROVIDER_SITE_OTHER): Payer: Medicare Other | Admitting: Internal Medicine

## 2018-09-12 ENCOUNTER — Encounter: Payer: Self-pay | Admitting: Internal Medicine

## 2018-09-12 VITALS — BP 142/84 | HR 113 | Ht 63.0 in | Wt 231.4 lb

## 2018-09-12 DIAGNOSIS — J4530 Mild persistent asthma, uncomplicated: Secondary | ICD-10-CM | POA: Diagnosis not present

## 2018-09-12 DIAGNOSIS — H6123 Impacted cerumen, bilateral: Secondary | ICD-10-CM | POA: Diagnosis not present

## 2018-09-12 MED ORDER — MONTELUKAST SODIUM 10 MG PO TABS: ORAL_TABLET | ORAL | 11 refills | Status: DC

## 2018-09-12 MED ORDER — PREDNISONE 10 MG PO TABS: ORAL_TABLET | ORAL | 0 refills | Status: DC

## 2018-09-12 NOTE — Progress Notes (Signed)
Subjective:   Patient ID: Barry [name], male    DOB: Nov 08, 1946 .   MRN: [id number]   Brief patient profile:  72 yowm never smoker with relatively mild asthma but superimposed on obesity plus a chronically paralyzed left hemidiaphragm and a tendency to upper airway cough syndrome.    History of Present Illness  03/24/12 [name] ov for sinusitis > rx levaquin > sinus CT > Negative paranasal sinuses.    11/16/2015  f/u ov/Roy Snuffer re: acute exac ab   Chief Complaint  Patient presents with  . Follow-up    Pt c/o increased chest congestion, cough with green mucus and some wheeze/SOB x5 days.    Baseline improves quit a bit between visits on symb 160 2bid maint rx /  not needing saba but started needing 3x daily and a hs x 5 days prior to OV  Ins setting of acute cough/ congestion  rec Augmentin 875 mg take one pill twice daily  X 10 days - Prednisone 10 mg take  4 each am x 2 days,   2 each am x 2 days,  1 each am x 2 days and stop  Remember to use afrin x 5 days before each dose of flonase Remember to use the flutter valve Work on inhaler technique:    Please schedule a follow up visit in 3 months but call sooner if needed to see [name] NP    02/14/2016 NP Follow up : Asthma and Chronic Rhinitis  Pt returns for 3 month follow up .  Complains over last 4 days , breathing is up & down. occ SOB. prod. cough brownish in color. occ. wheezing. occ. chest pain/tightness Had asthmatic bronchitis last ov, tx w/ augmentin and prednisone taper. He says he got over until last few days.  Denies chest pain , orthopnea, chest pain, edema or fever.  Remains on Symbicort and mucinex.  Takes chlortrimeton Three times a day  .  Noticed the trees are blooming having more drainage.  rec Omnicef 300mg  Twice daily  For 1 week  Prednisone taper over next week.  Eat yogurt while on antibiotics .  Mucinex DM Twice daily  As needed  Cough/congestion     06/13/2016  f/u ov/Fenix Ruppe re:  AB/ symbicort 160  2bid/flonase - using med calendar well  Does really well p pred each time he gets it then gradually worse over several weeks esp in am cough cough / congestion/ slt yellow mucus / slt more sob but rare need for saba  Worse now x sev weeks again same exact symptoms with also nasal congestion but not able to use afrin or hfa effectively - see a/p rec Prednisone 10 mg take  4 each am x 2 days,   2 each am x 2 days,  1 each am x 2 days and stop  Work on inhaler technique:  Please schedule a follow up visit in 3 months but call sooner if needed to See [name] for new med calendar and bring your spacer for training every visit - ? Add singulair ?    09/12/2018  f/u ov/Tarisa Paola re: asthma  Chief Complaint  Patient presents with  . Follow-up    Breathing is overall doing well. He is using his albuterol inhaler 1 x daily on average. He has a prod cough with clear sputum.    Dyspnea:  Limited by knees > sob Cough: tends to be worse in am /clear mucus Sleeping: in lift chair 45 degrees SABA use: no  particular pattern 02: none    No obvious day to day or daytime variability or assoc  purulent sputum or mucus plugs or hemoptysis or cp or chest tightness, subjective wheeze or overt sinus or hb symptoms.   Sleeping as above without nocturnal  or early am exacerbation  of respiratory  c/o's or need for noct saba. Also denies any obvious fluctuation of symptoms with weather or environmental changes or other aggravating or alleviating factors except as outlined above   No unusual exposure hx or h/o childhood pna/ asthma or knowledge of premature birth.  Current Allergies, Complete Past Medical History, Past Surgical History, Family History, and Social History were reviewed in [employer] record.  ROS  The following are not active complaints unless bolded Hoarseness, sore throat, dysphagia, dental problems, itching, sneezing,  nasal congestion or discharge of excess mucus or purulent  secretions, ear ache,   fever, chills, sweats, unintended wt loss or wt gain, classically pleuritic or exertional cp,  orthopnea pnd or arm/hand swelling  or leg swelling, presyncope, palpitations, abdominal pain, anorexia, nausea, vomiting, diarrhea  or change in bowel habits or change in bladder habits, change in stools or change in urine, dysuria, hematuria,  rash, arthralgias, visual complaints, headache, numbness, weakness or ataxia or problems with walking due to knees or coordination,  change in mood or  memory.        Current Meds  Medication Sig  . acetaminophen (TYLENOL) 500 MG tablet Take per bottle as needed for arthritis  . aspirin 81 MG tablet Take 81 mg by mouth daily.  [name] atorvastatin (LIPITOR) 40 MG tablet Take 40 mg by mouth daily.    . budesonide-formoterol (SYMBICORT) 160-4.5 MCG/ACT inhaler INHALE 2 PUFFS INTO THE LUNGS 2 (TWO) TIMES DAILY.  . carboxymethylcellulose (REFRESH PLUS) 0.5 % SOLN Use as needed for dry eye  . cholecalciferol (VITAMIN D) 1000 UNITS tablet Take 1,000 Units by mouth daily.  . cycloSPORINE (RESTASIS) 0.05 % ophthalmic emulsion Place 1 drop into both eyes 2 (two) times daily.  [name] Dextromethorphan-Guaifenesin (MUCINEX DM MAXIMUM STRENGTH) 60-1200 MG per 12 hr tablet Take 1 tablet by mouth every 12 (twelve) hours as needed (for cough or thick mucus).   [name] diltiazem (DILACOR XR) 240 MG 24 hr capsule Take 240 mg by mouth daily.   . Eyelid Cleansers (OCUSOFT EYELID CLEANSING) PADS Apply topically. Use daily  . ezetimibe (ZETIA) 10 MG tablet Take 10 mg by mouth daily.    . fexofenadine (ALLEGRA) 180 MG tablet Take 180 mg by mouth daily.  . fluticasone (FLONASE) 50 MCG/ACT nasal spray Place 2 sprays into both nostrils 2 (two) times daily.   [name] latanoprost (XALATAN) 0.005 % ophthalmic solution Place 1 drop into both eyes at bedtime.  . metoprolol tartrate (LOPRESSOR) 25 MG tablet Take 12.5 mg by mouth 2 (two) times daily.   . Multiple Vitamins-Minerals (CENTRUM  SILVER PO) Take 1 tablet by mouth daily.  [name] omeprazole (PRILOSEC) 20 MG capsule Take 20 mg by mouth 2 (two) times daily before a meal.   . oxymetazoline (AFRIN) 0.05 % nasal spray Place 1 spray into both nostrils 2 (two) times daily as needed for congestion (for 5 days, then stop).   Vladimir Faster Glyc-Propyl Glyc PF (SYSTANE ULTRA PF) 0.4-0.3 % SOLN Apply 1 drop to eye as needed.  . polyethylene glycol (MIRALAX / GLYCOLAX) packet Take 17 g by mouth daily.  . Pseudoephedrine-Ibuprofen (ADVIL COLD/SINUS) 30-200 MG TABS Per bottle as needed for sinus pressure/pain  .  sodium chloride (OCEAN) 0.65 % SOLN nasal spray Use as needed for nasal congestion  . Spacer/Aero-Holding Chambers (AEROCHAMBER MV) inhaler Use as instructed  . sulfaSALAzine (AZULFIDINE) 500 MG tablet Take 1,000 mg by mouth 4 (four) times daily.   . 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  Husein - Pneumovax age 74 November 01, 2009 (second shot) [name] 12/2013  -med calendar 09/11/2012 , 01/05/14 , 05/12/2015   Family History:  Coronary Heart Disease, father  Hypertension, mother   Social History:  Patient never smoked.  no alcohol  single  no children  disabled: textiles         Objective:      amb wm slt hoarse /nasal tone to voice / pseudowheeze much better  with plm  Wt  233 06/06/11  >> 233 09/18/2013 > 11/05/2013 226 >231 01/06/2014 > 238 03/08/2014 > 03/26/2014 239 > 06/24/2014 238 >  02/11/15 233 >233 05/12/2015 >237 08/19/2015 > 11/16/2015  239 >234 02/14/2016 >  06/13/2016   234 > 09/12/2018  231      HEENT: nl dentition, turbinates bilaterally, and oropharynx.   Both external ear canals obst by dry hard wax s cough reflex   NECK :  without JVD/Nodes/TM/ nl carotid upstrokes bilaterally   LUNGS: no acc muscle use,  Nl contour chest with mostly transmitted upper airway noise  bilaterally without cough on insp or exp maneuvers   CV:  RRR  no s3 or murmur or increase in P2, and no edema   ABD:  soft and nontender with nl inspiratory excursion in the supine position. No bruits or organomegaly appreciated, bowel sounds nl  MS:  Very awkward dlow gait/ ext warm with severe valgus deformities both knees , calf tenderness, cyanosis or clubbing     SKIN: warm and dry without lesions    NEURO:  alert, approp, nl sensorium with  no motor or cerebellar deficits apparent.              Assessment & Plan:

## 2018-09-12 NOTE — Patient Instructions (Addendum)
Singulair 10 mg daily take it in evening  Every night as a new maintenance medication (should help sinus, cough, wheeze or need for albuterol) and if not don't refill  If worse > Prednisone 10 mg take  4 each am x 2 days,   2 each am x 2 days,  1 each am x 2 days and stop    See [name] NP for ear lavage but use the otc kit first for about a week  -add needs allergy profile next ov

## 2018-09-15 ENCOUNTER — Encounter: Payer: Self-pay | Admitting: Internal Medicine

## 2018-09-15 DIAGNOSIS — H6123 Impacted cerumen, bilateral: Secondary | ICD-10-CM | POA: Insufficient documentation

## 2018-09-15 HISTORY — DX: Impacted cerumen, bilateral: H61.23

## 2018-09-15 NOTE — Assessment & Plan Note (Addendum)
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  - HFA 25% August 01, 2009 > 50% November 01, 2009 > 50% January 31, 2010 therefore changed to neb bud/brovana - HFA 75% p extensive coaching 08/07/2013  > insurance issues so try symbicort 160 2 bid instead of neb - 11/16/2015   p extensive coaching HFA effectiveness =    75% with  spacer  - 06/13/2016  After extensive coaching HFA effectiveness =    50% s spacer  - The proper method of use, as well as anticipated side effects, of a metered-dose inhaler are discussed and demonstrated to the patient   Assoc with active rhinitis so rec trial of singulair 10 mg daily and keep pred x 6 days on hand if not improving   Allergy profile next ov    I had an extended discussion with the patient reviewing all relevant studies completed to date and  lasting 15 to 20 minutes of a 25 minute visit    See device teaching which extended face to face time for this visit.  Each maintenance medication was reviewed in detail including emphasizing most importantly the difference between maintenance and prns and under what circumstances the prns are to be triggered using an action plan format that is not reflected in the computer generated alphabetically organized AVS which I have not found useful in most complex patients, especially with respiratory illnesses  Please see AVS for specific instructions unique to this visit that I personally wrote and verbalized to the the pt in detail and then reviewed with pt  by my nurse highlighting any  changes in therapy recommended at today's visit to their plan of care.

## 2018-09-15 NOTE — Assessment & Plan Note (Signed)
Advised to use ear wax remover otc then return for lavage by NP's

## 2018-09-15 NOTE — Assessment & Plan Note (Signed)
-   Target wt < 191 to get under BMI 30   Body mass index is 40.99 kg/m.  -  trending down sltly  No results found for: TSH   Contributing to gerd risk/ doe/djd reviewed the need and the process to achieve and maintain neg calorie balance > defer f/u primary care including intermittently monitoring thyroid status

## 2018-09-16 DIAGNOSIS — Z951 Presence of aortocoronary bypass graft: Secondary | ICD-10-CM | POA: Diagnosis not present

## 2018-09-16 DIAGNOSIS — I252 Old myocardial infarction: Secondary | ICD-10-CM | POA: Diagnosis not present

## 2018-09-16 DIAGNOSIS — Z0000 Encounter for general adult medical examination without abnormal findings: Secondary | ICD-10-CM | POA: Diagnosis not present

## 2018-09-16 DIAGNOSIS — K5100 Ulcerative (chronic) pancolitis without complications: Secondary | ICD-10-CM | POA: Diagnosis not present

## 2018-09-16 DIAGNOSIS — K219 Gastro-esophageal reflux disease without esophagitis: Secondary | ICD-10-CM | POA: Diagnosis not present

## 2018-09-16 DIAGNOSIS — J45909 Unspecified asthma, uncomplicated: Secondary | ICD-10-CM | POA: Diagnosis not present

## 2018-09-16 DIAGNOSIS — M1990 Unspecified osteoarthritis, unspecified site: Secondary | ICD-10-CM | POA: Diagnosis not present

## 2018-09-16 DIAGNOSIS — I10 Essential (primary) hypertension: Secondary | ICD-10-CM | POA: Diagnosis not present

## 2018-09-16 DIAGNOSIS — Z1389 Encounter for screening for other disorder: Secondary | ICD-10-CM | POA: Diagnosis not present

## 2018-09-16 DIAGNOSIS — I2510 Atherosclerotic heart disease of native coronary artery without angina pectoris: Secondary | ICD-10-CM | POA: Diagnosis not present

## 2018-09-16 DIAGNOSIS — Z1159 Encounter for screening for other viral diseases: Secondary | ICD-10-CM | POA: Diagnosis not present

## 2018-09-16 DIAGNOSIS — Z125 Encounter for screening for malignant neoplasm of prostate: Secondary | ICD-10-CM | POA: Diagnosis not present

## 2018-09-16 DIAGNOSIS — E7800 Pure hypercholesterolemia, unspecified: Secondary | ICD-10-CM | POA: Diagnosis not present

## 2018-09-19 ENCOUNTER — Encounter: Payer: Self-pay | Admitting: Adult Health

## 2018-09-19 ENCOUNTER — Other Ambulatory Visit (INDEPENDENT_AMBULATORY_CARE_PROVIDER_SITE_OTHER): Payer: Medicare Other

## 2018-09-19 ENCOUNTER — Ambulatory Visit (INDEPENDENT_AMBULATORY_CARE_PROVIDER_SITE_OTHER): Payer: Medicare Other | Admitting: Adult Health

## 2018-09-19 VITALS — BP 140/82 | HR 90 | Ht 63.0 in | Wt 227.4 lb

## 2018-09-19 DIAGNOSIS — J4530 Mild persistent asthma, uncomplicated: Secondary | ICD-10-CM | POA: Diagnosis not present

## 2018-09-19 DIAGNOSIS — H6123 Impacted cerumen, bilateral: Secondary | ICD-10-CM | POA: Diagnosis not present

## 2018-09-19 DIAGNOSIS — J310 Chronic rhinitis: Secondary | ICD-10-CM | POA: Diagnosis not present

## 2018-09-19 LAB — CBC WITH DIFFERENTIAL/PLATELET
BASOS ABS: 0 10*3/uL (ref 0.0–0.1)
Basophils Relative: 0.5 % (ref 0.0–3.0)
EOS ABS: 0.1 10*3/uL (ref 0.0–0.7)
EOS PCT: 1.3 % (ref 0.0–5.0)
HCT: 41.1 % (ref 39.0–52.0)
Hemoglobin: 13.5 g/dL (ref 13.0–17.0)
LYMPHS ABS: 1.4 10*3/uL (ref 0.7–4.0)
Lymphocytes Relative: 20.6 % (ref 12.0–46.0)
MCHC: 32.7 g/dL (ref 30.0–36.0)
MCV: 94 fl (ref 78.0–100.0)
MONO ABS: 0.7 10*3/uL (ref 0.1–1.0)
Monocytes Relative: 10.9 % (ref 3.0–12.0)
NEUTROS PCT: 66.7 % (ref 43.0–77.0)
Neutro Abs: 4.6 10*3/uL (ref 1.4–7.7)
Platelets: 189 10*3/uL (ref 150.0–400.0)
RBC: 4.38 Mil/uL (ref 4.22–5.81)
RDW: 15.7 % — ABNORMAL HIGH (ref 11.5–15.5)
WBC: 6.9 10*3/uL (ref 4.0–10.5)

## 2018-09-19 MED ORDER — BENZONATATE 200 MG PO CAPS: 200.0000 mg | ORAL_CAPSULE | Freq: Three times a day (TID) | ORAL | 3 refills | Status: DC | PRN

## 2018-09-19 NOTE — Progress Notes (Signed)
@Patient  ID: Barry [name], male    DOB: April 10, 1946, 72 y.o.   MRN: [id number]  Chief Complaint  Patient presents with  . Follow-up    Referring provider: [name], MD  HPI: [id number] never smoker with relatively mild asthma but superimposed on obesity plus a chronically paralyzed left hemidiaphragm and a tendency to upper airway cough syndrome.  Has Ulcerative Colitis -Followed by Dr. Wynetta Emery- on SulfaSalazine .  TEST  PFTs 06/15/2005 FEV1 85% predicted ratio 68% and truncation of respiratory loop in a sawtooth pattern  03/24/12 CT Sinus >Negative paranasal sinuses - Sinus CT 08/07/2013>>Short air-fluid levels in the maxillary sinuses bilaterally suggesting early acute sinusitis PVX 2016  Prevnar 2015  09/19/2018 Follow up : Asthma /Cerumen impaction  Patient presents for a one-week follow-up.  Patient was seen last visit and found to have bilateral cerumen impactions.  Patient says his ears feel stopped up.  He denies any pain or drainage.  Patient also was having some mild asthma flare with increased nasal drainage and congestion.  Patient was started on Singulair daily.  He was also given a prednisone taper if symptoms worsen.  Patient says he did take the steroid taper.  And is feeling better.  Still has some intermittent cough.  He denies any increased albuterol use.     Allergies  Allergen Reactions  . Piroxicam Itching and Rash    Immunization History  Administered Date(s) Administered  . Influenza Split 10/17/2012, 09/16/2014, 10/15/2015  . Influenza Whole 08/17/2009, 09/11/2010, 09/17/2011  . Influenza, High Dose Seasonal PF 09/16/2016, 09/25/2017, 09/10/2018  . Influenza,inj,Quad PF,6+ Mos 09/18/2013  . Pneumococcal Conjugate-13 01/05/2014  . Pneumococcal Polysaccharide-23 11/16/2005, 11/01/2009  . Tdap 02/24/2015    Past Medical History:  Diagnosis Date  . Asthma    PFTs 06/15/05 FEV1 85% predicted ratio 68% and truncation of resp loop in a sawtooth  pattern. HFA 25% 08-01-2009 >50% Nov 01, 2009>50% 01-31-2010  . Cataract   . Chronic cough   . Coronary heart disease   . Glaucoma   . Hypertension   . Obesity   . 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 Retina disorder 2014  . Ulcerative colitis     Tobacco History: Social History   Tobacco Use  Smoking Status Never Smoker  Smokeless Tobacco Never Used   Counseling given: Not Answered   Outpatient Medications Prior to Visit  Medication Sig Dispense Refill  . acetaminophen (TYLENOL) 500 MG tablet Take per bottle as needed for arthritis    . aspirin 81 MG tablet Take 81 mg by mouth daily.    [name] atorvastatin (LIPITOR) 40 MG tablet Take 40 mg by mouth daily.      . budesonide-formoterol (SYMBICORT) 160-4.5 MCG/ACT inhaler INHALE 2 PUFFS INTO THE LUNGS 2 (TWO) TIMES DAILY. 1 Inhaler 5  . carboxymethylcellulose (REFRESH PLUS) 0.5 % SOLN Use as needed for dry eye    . cholecalciferol (VITAMIN D) 1000 UNITS tablet Take 1,000 Units by mouth daily.    . cycloSPORINE (RESTASIS) 0.05 % ophthalmic emulsion Place 1 drop into both eyes 2 (two) times daily.    [name] Dextromethorphan-Guaifenesin (MUCINEX DM MAXIMUM STRENGTH) 60-1200 MG per 12 hr tablet Take 1 tablet by mouth every 12 (twelve) hours as needed (for cough or thick mucus).     [name] diltiazem (DILACOR XR) 240 MG 24 hr capsule Take 240 mg by mouth daily.     . Eyelid Cleansers (OCUSOFT EYELID CLEANSING) PADS Apply topically. Use daily    .  ezetimibe (ZETIA) 10 MG tablet Take 10 mg by mouth daily.      . fexofenadine (ALLEGRA) 180 MG tablet Take 180 mg by mouth daily.    . fluticasone (FLONASE) 50 MCG/ACT nasal spray Place 2 sprays into both nostrils 2 (two) times daily.     [name] latanoprost (XALATAN) 0.005 % ophthalmic solution Place 1 drop into both eyes at bedtime.    . metoprolol tartrate (LOPRESSOR) 25 MG tablet Take 12.5 mg by mouth 2 (two) times daily.     . montelukast (SINGULAIR) 10 MG tablet One at bedtime every night 30  tablet 11  . Multiple Vitamins-Minerals (CENTRUM SILVER PO) Take 1 tablet by mouth daily.    [name] omeprazole (PRILOSEC) 20 MG capsule Take 20 mg by mouth 2 (two) times daily before a meal.     . oxymetazoline (AFRIN) 0.05 % nasal spray Place 1 spray into both nostrils 2 (two) times daily as needed for congestion (for 5 days, then stop).     Vladimir Faster Glyc-Propyl Glyc PF (SYSTANE ULTRA PF) 0.4-0.3 % SOLN Apply 1 drop to eye as needed.    . polyethylene glycol (MIRALAX / GLYCOLAX) packet Take 17 g by mouth daily.    . predniSONE (DELTASONE) 10 MG tablet Take  4 each am x 2 days,   2 each am x 2 days,  1 each am x 2 days and stop 14 tablet 0  . Pseudoephedrine-Ibuprofen (ADVIL COLD/SINUS) 30-200 MG TABS Per bottle as needed for sinus pressure/pain    . sodium chloride (OCEAN) 0.65 % SOLN nasal spray Use as needed for nasal congestion    . Spacer/Aero-Holding Chambers (AEROCHAMBER MV) inhaler Use as instructed 1 each 0  . sulfaSALAzine (AZULFIDINE) 500 MG tablet Take 1,000 mg by mouth 4 (four) times daily.     . VENTOLIN HFA 108 (90 Base) MCG/ACT inhaler INHALE 2 PUFFS INTO THE LUNGS EVERY 4 HRS AS NEEDED FOR WHEEZING OR SHORTNESS OF BREATH (PLAN B) 18 g 5   No facility-administered medications prior to visit.      Review of Systems  Constitutional:   No  weight loss, night sweats,  Fevers, chills, fatigue, or  lassitude.  HEENT:   No headaches,  Difficulty swallowing,  Tooth/dental problems, or  Sore throat,                No sneezing, itching, ear fullness  nasal congestion, post nasal drip,   CV:  No chest pain,  Orthopnea, PND, swelling in lower extremities, anasarca, dizziness, palpitations, syncope.   GI  No heartburn, indigestion, abdominal pain, nausea, vomiting, diarrhea, change in bowel habits, loss of appetite, bloody stools.   Resp:    No chest wall deformity  Skin: no rash or lesions.  GU: no dysuria, change in color of urine, no urgency or frequency.  No flank pain, no  hematuria   MS:  No joint pain or swelling.  No decreased range of motion.  No back pain.    Physical Exam  BP 140/82 (BP Location: Right Arm, Cuff Size: Normal)   Pulse 90   Ht 5\' 3"  (1.6 m)   Wt 227 lb 6.4 oz (103.1 kg)   SpO2 95%   BMI 40.28 kg/m   GEN: A/Ox3; pleasant , NAD, morbidly obese , walker    HEENT:  [place]/AT,  EACs-bilateral cerumen impaction  , NOSE-clear, THROAT-clear, no lesions, no postnasal drip or exudate noted.   NECK:  Supple w/ fair ROM; no JVD; normal carotid  impulses w/o bruits; no thyromegaly or nodules palpated; no lymphadenopathy.    RESP  Clear  P & A; w/o, wheezes/ rales/ or rhonchi. no accessory muscle use, no dullness to percussion  CARD:  RRR, no m/r/g, no peripheral edema, pulses intact, no cyanosis or clubbing.  GI:   Soft & nt; nml bowel sounds; no organomegaly or masses detected.   Musco: Warm bil, . LE braces   Neuro: alert, no focal deficits noted.  LE braces   Skin: Warm, no lesions or rashes    Lab Results:   BMET  BNP No results found for: BNP  ProBNP No results found for: PROBNP  Imaging: No results found.   Assessment & Plan:   Asthma, chronic Recent mild flare now resolved.   Plan  Patient Instructions  Mucinex DM Twice daily  As needed  Cough/congestion  Tessalon Three times a day  As needed  Cough .  Saline nasal rinses As needed  Continue Allegra 180mg  daily in am.  Continue on Symbicort Twice daily , rinse after use.  Continue on Singulair daily .  Continue on Flonase .  Labs today .  Debrox As needed   Follow with up 4 months and As needed   Please contact office for sooner follow up if symptoms do not improve or worsen or seek emergency care       Chronic rhinitis Cont on current regimen  Check RAST panel   Excessive ear wax, bilateral Ear irrigation , tolerated well without known complications  Use debrox As needed       Rexene Edison, NP 09/19/2018

## 2018-09-19 NOTE — Patient Instructions (Addendum)
Mucinex DM Twice daily  As needed  Cough/congestion  Tessalon Three times a day  As needed  Cough .  Saline nasal rinses As needed  Continue Allegra 180mg  daily in am.  Continue on Symbicort Twice daily , rinse after use.  Continue on Singulair daily .  Continue on Flonase .  Labs today .  Debrox As needed   Follow with up 4 months and As needed   Please contact office for sooner follow up if symptoms do not improve or worsen or seek emergency care

## 2018-09-19 NOTE — Assessment & Plan Note (Signed)
Ear irrigation , tolerated well without known complications  Use debrox As needed

## 2018-09-19 NOTE — Assessment & Plan Note (Signed)
Cont on current regimen  Check RAST panel

## 2018-09-19 NOTE — Assessment & Plan Note (Signed)
Recent mild flare now resolved.   Plan  Patient Instructions  Mucinex DM Twice daily  As needed  Cough/congestion  Tessalon Three times a day  As needed  Cough .  Saline nasal rinses As needed  Continue Allegra 180mg  daily in am.  Continue on Symbicort Twice daily , rinse after use.  Continue on Singulair daily .  Continue on Flonase .  Labs today .  Debrox As needed   Follow with up 4 months and As needed   Please contact office for sooner follow up if symptoms do not improve or worsen or seek emergency care

## 2018-09-20 NOTE — Progress Notes (Signed)
Chart and office note reviewed in detail  > agree with a/p as outlined    

## 2018-09-22 LAB — RESPIRATORY ALLERGY PROFILE REGION II ~~LOC~~
Allergen, A. alternata, m6: <0.1 kU/L
Allergen, Cedar tree, t12: <0.1 kU/L
Allergen, D pternoyssinus,d7: <0.1 kU/L
Allergen, Mulberry, t76: <0.1 kU/L
Allergen, Oak,t7: <0.1 kU/L
Allergen, P. notatum, m1: <0.1 kU/L
Aspergillus fumigatus, m3: <0.1 kU/L
CLADOSPORIUM HERBARUM (M2) IGE: <0.1 kU/L
CLASS: 0
CLASS: 0
CLASS: 0
CLASS: 0
CLASS: 0
COMMON RAGWEED (SHORT) (W1) IGE: <0.1 kU/L
Cat Dander: <0.1 kU/L
Class: 0
Class: 0
Class: 0
Class: 0
Class: 0
Class: 0
Class: 0
Class: 0
Class: 0
Class: 0
Class: 0
Class: 0
Class: 0
Class: 0
Class: 0
Class: 0
Class: 0
Class: 0
Class: 0
Cockroach: <0.1 kU/L
IGE (IMMUNOGLOBULIN E), SERUM: 5 kU/L (ref <=114)
Pecan/Hickory Tree IgE: <0.1 kU/L
Rough Pigweed  IgE: <0.1 kU/L
Timothy Grass: <0.1 kU/L

## 2018-09-22 LAB — INTERPRETATION:

## 2018-09-25 ENCOUNTER — Telehealth: Payer: Self-pay | Admitting: Adult Health

## 2018-09-25 NOTE — Telephone Encounter (Signed)
Called and spoke with patient he is aware and verbalized understanding. Nothing further needed.  

## 2018-12-23 DIAGNOSIS — H402232 Chronic angle-closure glaucoma, bilateral, moderate stage: Secondary | ICD-10-CM | POA: Diagnosis not present

## 2018-12-23 DIAGNOSIS — H40053 Ocular hypertension, bilateral: Secondary | ICD-10-CM | POA: Diagnosis not present

## 2018-12-23 DIAGNOSIS — H04123 Dry eye syndrome of bilateral lacrimal glands: Secondary | ICD-10-CM | POA: Diagnosis not present

## 2018-12-23 DIAGNOSIS — H1013 Acute atopic conjunctivitis, bilateral: Secondary | ICD-10-CM | POA: Diagnosis not present

## 2019-01-12 ENCOUNTER — Other Ambulatory Visit: Payer: Self-pay | Admitting: Cardiology

## 2019-01-12 MED ORDER — METOPROLOL TARTRATE 25 MG PO TABS: 25.0000 mg | ORAL_TABLET | Freq: Two times a day (BID) | ORAL | 0 refills | Status: DC

## 2019-01-12 NOTE — Telephone Encounter (Signed)
°  1. Which medications need to be refilled? (please list name of each medication and dose if known) Metoprolol tartrate 25mg   2. Which pharmacy/location (including street and city if local pharmacy) is medication to be sent to?[id number]  3. Do they need a 30 day or 90 day supply? [place]

## 2019-01-12 NOTE — Telephone Encounter (Signed)
Has been given to Dr. Revankar for him to review  

## 2019-01-12 NOTE — Telephone Encounter (Signed)
Per Dr. Geraldo Pitter metoprolol tartrate 25 mg twice daily refilled for 2 months.

## 2019-01-16 ENCOUNTER — Other Ambulatory Visit: Payer: Self-pay

## 2019-01-21 ENCOUNTER — Ambulatory Visit: Payer: Medicare Other | Admitting: Adult Health

## 2019-01-23 ENCOUNTER — Ambulatory Visit (INDEPENDENT_AMBULATORY_CARE_PROVIDER_SITE_OTHER): Payer: Medicare Other | Admitting: Adult Health

## 2019-01-23 ENCOUNTER — Encounter: Payer: Self-pay | Admitting: Adult Health

## 2019-01-23 VITALS — BP 146/80 | HR 83 | Ht 63.0 in | Wt 231.2 lb

## 2019-01-23 DIAGNOSIS — J4530 Mild persistent asthma, uncomplicated: Secondary | ICD-10-CM | POA: Diagnosis not present

## 2019-01-23 DIAGNOSIS — J310 Chronic rhinitis: Secondary | ICD-10-CM | POA: Diagnosis not present

## 2019-01-23 DIAGNOSIS — I2510 Atherosclerotic heart disease of native coronary artery without angina pectoris: Secondary | ICD-10-CM | POA: Diagnosis not present

## 2019-01-23 NOTE — Progress Notes (Signed)
@Patient  ID: Barry [name], male    DOB: 15-Aug-1946, 73 y.o.   MRN: [id number]  Chief Complaint  Patient presents with  . 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  Has Ulcerative Colitis -Followed by Dr. Wynetta Emery- on SulfaSalazine .  TEST  PFTs 06/15/2005 FEV1 85% predicted ratio 68% and truncation of respiratory loop in a sawtooth pattern  03/24/12 CT Sinus >Negative paranasal sinuses - Sinus CT 08/07/2013>>Short air-fluid levels in the maxillary sinuses bilaterally suggesting early acute sinusitis CT sinus 2015- for sinusitis PVX 2016  Prevnar 2015  01/23/2019 Follow up : Asthma and Chronic Rhinitis  Patient presents for a 74-month follow-up.  Patient has mild persistent asthma for which he takes Symbicort twice daily.  This is complicated by chronic rhinitis.  He is on Singulair daily, Allegra and Flonase.  Labs with CBC with differential showed eosinophils were okay.  IgE was 5.  Allergy profile was negative. Since last visit patient says breathing has been doing better. Feels he has been doing well overall with no increased wheezing or cough .    Has been having a lot of dental work, better now but very expensive. Feels like this has helped him.   Allergies  Allergen Reactions  . Piroxicam Itching and Rash    Immunization History  Administered Date(s) Administered  . Influenza Split 10/17/2012, 09/16/2014, 10/15/2015  . Influenza Whole 08/17/2009, 09/11/2010, 09/17/2011  . Influenza, High Dose Seasonal PF 09/16/2016, 09/25/2017, 09/10/2018  . Influenza,inj,Quad PF,6+ Mos 09/18/2013  . Pneumococcal Conjugate-13 01/05/2014  . Pneumococcal Polysaccharide-23 11/16/2005, 11/01/2009  . Tdap 02/24/2015    Past Medical History:  Diagnosis Date  . Asthma    PFTs 06/15/05 FEV1  85% predicted ratio 68% and truncation of resp loop in a sawtooth pattern. HFA 25% 08-01-2009 >50% Nov 01, 2009>50% 01-31-2010  . Cataract   . Chronic cough   . Coronary heart disease   . Glaucoma   . Hypertension   . Obesity   . 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 Retina disorder 2014  . Ulcerative colitis     Tobacco History: Social History   Tobacco Use  Smoking Status Never Smoker  Smokeless Tobacco Never Used   Counseling given: Not Answered   Outpatient Medications Prior to Visit  Medication Sig Dispense Refill  . acetaminophen (TYLENOL) 500 MG tablet Take per bottle as needed for arthritis    . aspirin 81 MG tablet Take 81 mg by mouth daily.    [name] atorvastatin (LIPITOR) 40 MG tablet Take 40 mg by mouth daily.      . budesonide-formoterol (SYMBICORT) 160-4.5 MCG/ACT inhaler INHALE 2 PUFFS INTO THE LUNGS 2 (TWO) TIMES DAILY. 1 Inhaler 5  . carboxymethylcellulose (REFRESH PLUS) 0.5 % SOLN Use as needed for dry eye    . cholecalciferol (VITAMIN D) 1000 UNITS tablet Take 1,000 Units by mouth daily.    . cycloSPORINE (RESTASIS) 0.05 % ophthalmic emulsion Place 1 drop into both eyes 2 (two) times daily.    [name] Dextromethorphan-Guaifenesin (MUCINEX DM MAXIMUM STRENGTH) 60-1200 MG per 12 hr tablet Take 1 tablet by mouth every 12 (twelve) hours as needed (for cough or thick mucus).     [name] diltiazem (DILACOR XR) 240 MG 24 hr capsule Take 240 mg by mouth daily.     [name]  Eyelid Cleansers (OCUSOFT EYELID CLEANSING) PADS Apply topically. Use daily    . ezetimibe (ZETIA) 10 MG tablet Take 10 mg by mouth daily.      . fexofenadine (ALLEGRA) 180 MG tablet Take 180 mg by mouth daily.    . fluticasone (FLONASE) 50 MCG/ACT nasal spray Place 2 sprays into both nostrils 2 (two) times daily.     [name] latanoprost (XALATAN) 0.005 % ophthalmic solution Place 1 drop into both eyes at bedtime.    . metoprolol tartrate (LOPRESSOR) 25 MG tablet Take 1 tablet (25 mg total) by mouth 2 (two) times  daily. 120 tablet 0  . montelukast (SINGULAIR) 10 MG tablet One at bedtime every night 30 tablet 11  . Multiple Vitamins-Minerals (CENTRUM SILVER PO) Take 1 tablet by mouth daily.    [name] omeprazole (PRILOSEC) 20 MG capsule Take 20 mg by mouth 2 (two) times daily before a meal.     . oxymetazoline (AFRIN) 0.05 % nasal spray Place 1 spray into both nostrils 2 (two) times daily as needed for congestion (for 5 days, then stop).     Vladimir Faster Glyc-Propyl Glyc PF (SYSTANE ULTRA PF) 0.4-0.3 % SOLN Apply 1 drop to eye as needed.    . polyethylene glycol (MIRALAX / GLYCOLAX) packet Take 17 g by mouth daily.    . Pseudoephedrine-Ibuprofen (ADVIL COLD/SINUS) 30-200 MG TABS Per bottle as needed for sinus pressure/pain    . sodium chloride (OCEAN) 0.65 % SOLN nasal spray Use as needed for nasal congestion    . Spacer/Aero-Holding Chambers (AEROCHAMBER MV) inhaler Use as instructed 1 each 0  . sulfaSALAzine (AZULFIDINE) 500 MG tablet Take 1,000 mg by mouth 4 (four) times daily.     . VENTOLIN HFA 108 (90 Base) MCG/ACT inhaler INHALE 2 PUFFS INTO THE LUNGS EVERY 4 HRS AS NEEDED FOR WHEEZING OR SHORTNESS OF BREATH (PLAN B) 18 g 5  . benzonatate (TESSALON) 200 MG capsule Take 1 capsule (200 mg total) by mouth 3 (three) times daily as needed for cough. 45 capsule 3  . predniSONE (DELTASONE) 10 MG tablet Take  4 each am x 2 days,   2 each am x 2 days,  1 each am x 2 days and stop 14 tablet 0   No facility-administered medications prior to visit.      Review of Systems:   Constitutional:   No  weight loss, night sweats,  Fevers, chills,  +fatigue, or  lassitude.  HEENT:   No headaches,  Difficulty swallowing,  Tooth/dental problems, or  Sore throat,                No sneezing, itching, ear ache, nasal congestion, post nasal drip,   CV:  No chest pain,  Orthopnea, PND, swelling in lower extremities, anasarca, dizziness, palpitations, syncope.   GI  No heartburn, indigestion, abdominal pain, nausea, vomiting,  diarrhea, change in bowel habits, loss of appetite, bloody stools.   Resp: No shortness of breath with exertion or at rest.  No excess mucus, no productive cough,  No non-productive cough,  No coughing up of blood.  No change in color of mucus.  No wheezing.  No chest wall deformity  Skin: no rash or lesions.  GU: no dysuria, change in color of urine, no urgency or frequency.  No flank pain, no hematuria   MS: + joint pain .    No back pain.    Physical Exam  BP (!) 146/80 (BP Location: Left Arm, Patient Position: Sitting, Cuff  Size: Large)   Pulse 83   Ht 5\' 3"  (1.6 m)   Wt 231 lb 3.2 oz (104.9 kg)   SpO2 94%   BMI 40.96 kg/m   GEN: A/Ox3; pleasant , NAD, obese , walker    HEENT:  [place]/AT,  EACs-clear, TMs-wnl, NOSE-clear, THROAT-clear, no lesions, no postnasal drip or exudate noted.   NECK:  Supple w/ fair ROM; no JVD; normal carotid impulses w/o bruits; no thyromegaly or nodules palpated; no lymphadenopathy.    RESP  Clear  P & A; w/o, wheezes/ rales/ or rhonchi. no accessory muscle use, no dullness to percussion  CARD:  RRR, no m/r/g, no peripheral edema, pulses intact, no cyanosis or clubbing.  GI:   Soft & nt; nml bowel sounds; no organomegaly or masses detected.   Musco: Warm bil, no deformities or joint swelling noted.   Neuro: alert, no focal deficits noted.    Skin: Warm, no lesions or rashes    Lab Results:  CBC  BMET  BNP No results found for: BNP  ProBNP No results found for: PROBNP  Imaging: No results found.    No flowsheet data found.  No results found for: NITRICOXIDE      Assessment & Plan:   Asthma, chronic Seems to be doing well today , best I have seen in good while .   Plan  Patient Instructions  Continue Allegra 180mg  daily in am.  Continue on Symbicort Twice daily , rinse after use.  Continue on Singulair daily .  Continue on Flonase daily .  Follow with up 4 months and As needed   Please contact office for sooner  follow up if symptoms do not improve or worsen or seek emergency care       Chronic rhinitis Compensated   Plan  Patient Instructions  Continue Allegra 180mg  daily in am.  Continue on Symbicort Twice daily , rinse after use.  Continue on Singulair daily .  Continue on Flonase daily .  Follow with up 4 months and As needed   Please contact office for sooner follow up if symptoms do not improve or worsen or seek emergency care          Rexene Edison, NP 01/23/2019

## 2019-01-23 NOTE — Patient Instructions (Addendum)
Continue Allegra 180mg daily in am.  Continue on Symbicort Twice daily , rinse after use.  Continue on Singulair daily .  Continue on Flonase daily .  Follow with up 4 months and As needed   Please contact office for sooner follow up if symptoms do not improve or worsen or seek emergency care   

## 2019-01-23 NOTE — Assessment & Plan Note (Signed)
Seems to be doing well today , best I have seen in good while .   Plan  Patient Instructions  Continue Allegra 180mg  daily in am.  Continue on Symbicort Twice daily , rinse after use.  Continue on Singulair daily .  Continue on Flonase daily .  Follow with up 4 months and As needed   Please contact office for sooner follow up if symptoms do not improve or worsen or seek emergency care

## 2019-01-23 NOTE — Assessment & Plan Note (Signed)
Compensated   Plan  Patient Instructions  Continue Allegra 180mg daily in am.  Continue on Symbicort Twice daily , rinse after use.  Continue on Singulair daily .  Continue on Flonase daily .  Follow with up 4 months and As needed   Please contact office for sooner follow up if symptoms do not improve or worsen or seek emergency care      

## 2019-01-26 NOTE — Progress Notes (Signed)
Chart and office note reviewed in detail  > agree with a/p as outlined    

## 2019-02-06 ENCOUNTER — Ambulatory Visit: Payer: Medicare Other | Admitting: Cardiology

## 2019-02-06 ENCOUNTER — Encounter: Payer: Self-pay | Admitting: Cardiology

## 2019-02-06 DIAGNOSIS — I10 Essential (primary) hypertension: Secondary | ICD-10-CM | POA: Insufficient documentation

## 2019-02-06 DIAGNOSIS — I2510 Atherosclerotic heart disease of native coronary artery without angina pectoris: Secondary | ICD-10-CM

## 2019-02-06 DIAGNOSIS — E785 Hyperlipidemia, unspecified: Secondary | ICD-10-CM | POA: Insufficient documentation

## 2019-02-06 HISTORY — DX: Hyperlipidemia, unspecified: E78.5

## 2019-02-06 HISTORY — DX: Atherosclerotic heart disease of native coronary artery without angina pectoris: I25.10

## 2019-02-06 NOTE — Progress Notes (Deleted)
Cardiology Office Note    Date:  02/06/2019   ID:  Barry [name], Barry [name] 1946/06/27, MRN [id number]  PCP:  [name], MD  Cardiologist:  Fransico Him, MD   No chief complaint on file.   History of Present Illness:  Barry [name] is a 73 y.o. male who is being seen today for the evaluation of CAD at the request of [name], MD.  This is a 74 year old male with a history of asthma, hypertension and CAD status post remote CABG in 5361 complicated by paralyzation of left hemidiaphragm.  He has not been followed by cardiology and is now here to establish care.  [name]  He denies any chest pain or pressure, SOB, DOE, PND, orthopnea, LE edema, dizziness, palpitations or syncope. He is compliant with his meds and is tolerating meds with no SE.      Past Medical History:  Diagnosis Date  . Asthma    PFTs 06/15/05 FEV1 85% predicted ratio 68% and truncation of resp loop in a sawtooth pattern. HFA 25% 08-01-2009 >50% Nov 01, 2009>50% 01-31-2010  . CAD (coronary artery disease), native coronary artery 02/06/2019   S/p CABG 4431 complicated by paralyzed left hemidiaphram  . Cataract   . Chronic cough   . Coronary heart disease   . Glaucoma   . Hyperlipidemia LDL goal <70 02/06/2019  . Hypertension   . Obesity   . 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 Retina disorder 2014  . Ulcerative colitis     Past Surgical History:  Procedure Laterality Date  . COLONOSCOPY WITH PROPOFOL N/A 11/29/2015   Procedure: COLONOSCOPY WITH PROPOFOL;  Surgeon: [name], MD;  Location: [facility];  Service: Endoscopy;  Laterality: N/A;  . CORONARY ARTERY BYPASS GRAFT     x6    Current Medications: No outpatient medications have been marked as taking for the 02/06/19 encounter (Appointment) with Sueanne Margarita, MD.    Allergies:   Piroxicam   Social History   Socioeconomic History  . Marital status: Single    Spouse name: Not on file  . Number of children: 0  . Years of  education: Not on file  . Highest education level: Not on file  Occupational History  . Occupation: disabled, prev worked in Copy  . Financial resource strain: Not on file  . Food insecurity:    Worry: Not on file    Inability: Not on file  . Transportation needs:    Medical: Not on file    Non-medical: Not on file  Tobacco Use  . Smoking status: Never Smoker  . Smokeless tobacco: Never Used  Substance and Sexual Activity  . Alcohol use: No  . Drug use: No  . Sexual activity: Not on file  Lifestyle  . Physical activity:    Days per week: Not on file    Minutes per session: Not on file  . Stress: Not on file  Relationships  . Social connections:    Talks on phone: Not on file    Gets together: Not on file    Attends religious service: Not on file    Active member of club or organization: Not on file    Attends meetings of clubs or organizations: Not on file    Relationship status: Not on file  Other Topics Concern  . Not on file  Social History Narrative  . Not on file     Family History:  The patient's family history includes Heart disease in his father; Hypertension in his mother.   ROS:   Please see the history of present illness.    ROS All other systems reviewed and are negative.  No flowsheet data found.     PHYSICAL EXAM:   VS:  There were no vitals taken for this visit.   GEN: Well nourished, well developed, in no acute distress  HEENT: normal  Neck: no JVD, carotid bruits, or masses Cardiac: RRR; no murmurs, rubs, or gallops,no edema.  Intact distal pulses bilaterally.  Respiratory:  clear to auscultation bilaterally, normal work of breathing GI: soft, nontender, nondistended, + BS MS: no deformity or atrophy  Skin: warm and dry, no rash Neuro:  Alert and Oriented x 3, Strength and sensation are intact Psych: euthymic mood, full affect  Wt Readings from Last 3 Encounters:  01/23/19 231 lb 3.2 oz (104.9 kg)  09/19/18 227 lb 6.4  oz (103.1 kg)  09/12/18 231 lb 6.4 oz (105 kg)      Studies/Labs Reviewed:   EKG:  EKG is ordered today.  The ekg ordered today demonstrates ***  Recent Labs: 09/19/2018: Hemoglobin 13.5; Platelets 189.0   Lipid Panel No results found for: CHOL, TRIG, HDL, CHOLHDL, VLDL, LDLCALC, LDLDIRECT  Additional studies/ records that were reviewed today include:  Office notes from PCP    ASSESSMENT:    1. Coronary artery disease involving native coronary artery of native heart without angina pectoris   2. Benign essential HTN   3. Hyperlipidemia LDL goal <70      PLAN:  In order of problems listed above:  1. ASCAD - status post remote CABG in 2001.  He is doing well and denies any anginal symptoms.  2.  HTN -he is well controlled on exam today.  3.  Hyperlipidemia -his LDL goal is less than 70.   Medication Adjustments/Labs and Tests Ordered: Current medicines are reviewed at length with the patient today.  Concerns regarding medicines are outlined above.  Medication changes, Labs and Tests ordered today are listed in the Patient Instructions below.  There are no Patient Instructions on file for this visit.   Signed, Fransico Him, MD  02/06/2019 9:07 AM    [facility] [place], [place], [place]  [address] Phone: [phone]; Fax: [phone]

## 2019-02-13 ENCOUNTER — Ambulatory Visit: Payer: Medicare Other | Admitting: Cardiology

## 2019-02-18 NOTE — Progress Notes (Signed)
Cardiology Office Note   Date:  02/18/2019   ID:  [name], Barry February 26, 1946, MRN [id number]  PCP:  [name], MD  Cardiologist:   Jenkins Rouge, MD   No chief complaint on file.     History of Present Illness: Barry [name] is a 73 y.o. male who presents for consultation regarding CAD Referred by Dr Barry [name]. Previous patient of Dr Barry [name] He sees pulmonary for paralyzed left hemi diaphragm asthma and upper airway cough syndrome   History of CAD with CABG in August 2001 HTN and HLD on statin He has  UC and requires sulfasalazine and steriods  He has LE orthopedic issues and uses a walker Lives alone drives no real family.   No angina needs refills on meds    Past Medical History:  Diagnosis Date  . A-fib ([name])   . Acute sinusitis 03/24/2012   Followed in Pulmonary clinic/ [name]/ [name] - CT sinus 08/07/2013  Short air-fluid levels in the maxillary sinuses bilaterally suggesting early acute sinusitis. - repeat ct sinus 04/30/2014 > ok    . Acute sinusitis, unspecified 08/14/2010   Qualifier: Diagnosis of  By: Barry [name], Barry [name]    . Asthma    PFTs 06/15/05 FEV1 85% predicted ratio 68% and truncation of resp loop in a sawtooth pattern. HFA 25% 08-01-2009 >50% Nov 01, 2009>50% 01-31-2010  . Asthma, chronic 11/24/2007   Followed in Pulmonary clinic/ [name]/ [name]  - PFTs 06/15/2005 FEV1 85% predicted ratio 68% and truncation of respiratory loop in a sawtooth pattern  - HFA 25% August 01, 2009 > 50% November 01, 2009 > 50% January 31, 2010 therefore changed to neb bud/brovana - HFA 75% p extensive coaching 08/07/2013  > insurance issues so try symbicort 160 2 bid instead of neb - 11/16/2015   p extensive  . BRONCHITIS, ACUTE 01/22/2011   Qualifier: Diagnosis of  By: Barry [name], Barry [name]    . CAD (coronary artery disease), native coronary artery 02/06/2019   S/p CABG 7867 complicated by paralyzed left hemidiaphram  . Cataract   . Chronic cough   . Chronic rhinitis  05/02/2009   Followed in Pulmonary clinic/ [name]/ [name]    - 03/24/12 CT Sinus > Negative paranasal sinuses - Sinus CT 08/07/2013 >>Short air-fluid levels in the maxillary sinuses bilaterally suggesting early acute sinusitis - Repeat augmentin x 21 days 03/27/2014 then sinus ct if not better  - flonase/afrin regimen 02/11/15 >>>     . Coronary heart disease   . COUGH, CHRONIC 11/24/2007   Followed in Pulmonary clinic/ [name]/ [name]     . DDD (degenerative disc disease), lumbar   . 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   Followed in Pulmonary clinic/ [name]/ [name]  -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    . Excessive ear wax, bilateral 09/15/2018  . GASTROESOPHAGEAL REFLUX DISEASE 08/27/2008   Followed as Primary Care Patient/ GI/ Dr  Earle Gell    . Glaucoma   . Hyperlipidemia LDL goal <70 02/06/2019  . Hypertension   . Obesity   . Osteoarthritis   . 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 Retina disorder 2014  . Severe obesity (BMI >= 40) (HCC) 11/24/2007       - Target wt < 191 to get under BMI 30   . Ulcerative colitis   . ULCERATIVE COLITIS 11/24/2007   Followed as Primary Care Patient/ GI/ Dr  Earle Gell    . URI 04/09/2008  Qualifier: Diagnosis of  By: Barry [name], Barry [name]      Past Surgical History:  Procedure Laterality Date  . CARDIAC CATHETERIZATION Left   . COLONOSCOPY WITH PROPOFOL N/A 11/29/2015   Procedure: COLONOSCOPY WITH PROPOFOL;  Surgeon: Barry Fair, MD;  Location: [facility];  Service: Endoscopy;  Laterality: N/A;  . CORONARY ARTERY BYPASS GRAFT     x6     Current Outpatient Medications  Medication Sig Dispense Refill  . acetaminophen (TYLENOL) 500 MG tablet Take per bottle as needed for arthritis    . aspirin 81 MG tablet Take 81 mg by mouth daily.    [name] atorvastatin (LIPITOR) 40 MG tablet Take 40 mg by mouth daily.      . budesonide-formoterol (SYMBICORT) 160-4.5 MCG/ACT inhaler INHALE 2 PUFFS INTO  THE LUNGS 2 (TWO) TIMES DAILY. 1 Inhaler 5  . carboxymethylcellulose (REFRESH PLUS) 0.5 % SOLN Use as needed for dry eye    . cholecalciferol (VITAMIN D) 1000 UNITS tablet Take 1,000 Units by mouth daily.    . cycloSPORINE (RESTASIS) 0.05 % ophthalmic emulsion Place 1 drop into both eyes 2 (two) times daily.    [name] Dextromethorphan-Guaifenesin (MUCINEX DM MAXIMUM STRENGTH) 60-1200 MG per 12 hr tablet Take 1 tablet by mouth every 12 (twelve) hours as needed (for cough or thick mucus).     [name] diltiazem (DILACOR XR) 240 MG 24 hr capsule Take 240 mg by mouth daily.     [name] docusate sodium (COLACE) 100 MG capsule Take 100 mg by mouth daily as needed for mild constipation.    . Eyelid Cleansers (OCUSOFT EYELID CLEANSING) PADS Apply topically. Use daily    . ezetimibe (ZETIA) 10 MG tablet Take 10 mg by mouth daily.      . fexofenadine (ALLEGRA) 180 MG tablet Take 180 mg by mouth daily.    . fluticasone (FLONASE) 50 MCG/ACT nasal spray Place 2 sprays into both nostrils 2 (two) times daily.     [name] latanoprost (XALATAN) 0.005 % ophthalmic solution Place 1 drop into both eyes at bedtime.    . metoprolol tartrate (LOPRESSOR) 25 MG tablet Take 1 tablet (25 mg total) by mouth 2 (two) times daily. 120 tablet 0  . montelukast (SINGULAIR) 10 MG tablet One at bedtime every night 30 tablet 11  . Multiple Vitamins-Minerals (CENTRUM SILVER PO) Take 1 tablet by mouth daily.    [name] omeprazole (PRILOSEC) 20 MG capsule Take 20 mg by mouth 2 (two) times daily before a meal.     . oxymetazoline (AFRIN) 0.05 % nasal spray Place 1 spray into both nostrils 2 (two) times daily as needed for congestion (for 5 days, then stop).     Vladimir Faster Glyc-Propyl Glyc PF (SYSTANE ULTRA PF) 0.4-0.3 % SOLN Apply 1 drop to eye as needed.    . polyethylene glycol (MIRALAX / GLYCOLAX) packet Take 17 g by mouth daily.    . Pseudoephedrine-Ibuprofen (ADVIL COLD/SINUS) 30-200 MG TABS Per bottle as needed for sinus pressure/pain    . sodium chloride  (OCEAN) 0.65 % SOLN nasal spray Use as needed for nasal congestion    . Spacer/Aero-Holding Chambers (AEROCHAMBER MV) inhaler Use as instructed 1 each 0  . sulfaSALAzine (AZULFIDINE) 500 MG tablet Take 1,000 mg by mouth 4 (four) times daily.     . VENTOLIN HFA 108 (90 Base) MCG/ACT inhaler INHALE 2 PUFFS INTO THE LUNGS EVERY 4 HRS AS NEEDED FOR WHEEZING OR SHORTNESS OF BREATH (PLAN B) 18 g 5   No current  facility-administered medications for this visit.     Allergies:   Piroxicam    Social History:  The patient  reports that he has never smoked. He has never used smokeless tobacco. He reports that he does not drink alcohol or use drugs.   Family History:  The patient's family history includes Heart disease in his father; Hypertension in his mother; Other in his sister; Pulmonary Hypertension in his mother; Renal Disease in his mother.    ROS:  Please see the history of present illness.   Otherwise, review of systems are positive for none.   All other systems are reviewed and negative.    PHYSICAL EXAM: VS:  There were no vitals taken for this visit. , BMI There is no height or weight on file to calculate BMI. Affect appropriate Overweight white male  HEENT: normal Neck supple with no adenopathy JVP normal no bruits no thyromegaly Lungs clear with no wheezing and good diaphragmatic motion Heart:  S1/S2 no murmur, no rub, gallop or click PMI normal Abdomen: benighn, BS positve, no tenderness, no AAA no bruit.  No HSM or HJR Distal pulses intact with no bruits No edema Neuro non-focal Skin warm and dry LE orthopedic alignment issues knees done with inner knee calluses     EKG:  NSR rate 90 normal ECG 02/19/19    Recent Labs: 09/19/2018: Hemoglobin 13.5; Platelets 189.0    Lipid Panel No results found for: CHOL, TRIG, HDL, CHOLHDL, VLDL, LDLCALC, LDLDIRECT    Wt Readings from Last 3 Encounters:  01/23/19 104.9 kg  09/19/18 103.1 kg  09/12/18 105 kg      Other studies  Reviewed: Additional studies/ records that were reviewed today include: Notes from Dr Barry [name] cardiology ECG labs CABG report 2001 notes from pulmonary .    ASSESSMENT AND PLAN:  1.  CAD/CABG:  2001  No angina continue medical Rx  2. HTN:  Well controlled.  Continue current medications and low sodium Dash type diet.   3. HLD:  Continue statin labs with primary  4. Pulmonary:  No active wheezing f/u Barry [name] [facility] [place] 5. UC:  Continue steroids and sulfa f/u Dr Wynetta Emery GI   Current medicines are reviewed at length with the patient today.  The patient does not have concerns regarding medicines.  The following changes have been made:  no change  Labs/ tests ordered today include None  No orders of the defined types were placed in this encounter.    Disposition:   FU with cardiology in a year      Signed, Jenkins Rouge, MD  02/18/2019 5:23 PM    [address], [place], [place]  [address] Phone: [phone]; Fax: [phone]

## 2019-02-19 ENCOUNTER — Ambulatory Visit (INDEPENDENT_AMBULATORY_CARE_PROVIDER_SITE_OTHER): Payer: Medicare Other | Admitting: Cardiovascular Disease

## 2019-02-19 DIAGNOSIS — I25810 Atherosclerosis of coronary artery bypass graft(s) without angina pectoris: Secondary | ICD-10-CM | POA: Diagnosis not present

## 2019-02-19 DIAGNOSIS — E785 Hyperlipidemia, unspecified: Secondary | ICD-10-CM

## 2019-02-19 DIAGNOSIS — I10 Essential (primary) hypertension: Secondary | ICD-10-CM | POA: Diagnosis not present

## 2019-02-19 MED ORDER — ATORVASTATIN CALCIUM 40 MG PO TABS: 40.0000 mg | ORAL_TABLET | Freq: Every day | ORAL | 3 refills | Status: DC

## 2019-02-19 MED ORDER — DILTIAZEM HCL ER 240 MG PO CP24: 240.0000 mg | ORAL_CAPSULE | Freq: Every day | ORAL | 3 refills | Status: DC

## 2019-02-19 MED ORDER — METOPROLOL TARTRATE 25 MG PO TABS: 25.0000 mg | ORAL_TABLET | Freq: Two times a day (BID) | ORAL | 3 refills | Status: DC

## 2019-02-19 NOTE — Patient Instructions (Signed)

## 2019-03-19 ENCOUNTER — Other Ambulatory Visit: Payer: Self-pay | Admitting: Adult Health

## 2019-03-27 ENCOUNTER — Other Ambulatory Visit: Payer: Self-pay | Admitting: Adult Health

## 2019-04-29 ENCOUNTER — Telehealth: Payer: Self-pay | Admitting: Cardiovascular Disease

## 2019-04-29 NOTE — Telephone Encounter (Signed)
°*  STAT* If patient is at the pharmacy, call can be transferred to refill team.   1. Which medications need to be refilled? (please list name of each medication and dose if known) diltiazem (DILACOR XR) 240 MG 24 hr capsule    2. Which pharmacy/location (including street and city if local pharmacy) is medication to be sent to?  [id number] - , [address] [phone] (Phone) [phone] (Fax)    3. Do they need a 30 day or 90 day supply? 90 day

## 2019-04-29 NOTE — Telephone Encounter (Signed)
Called pt's pharmacy and pt already has an Rx for this medication with refills. Pharmacy is getting medication ready for pt to pick. Confirmation received.

## 2019-05-25 ENCOUNTER — Ambulatory Visit: Payer: Medicare Other | Admitting: Adult Health

## 2019-06-03 DIAGNOSIS — H35373 Puckering of macula, bilateral: Secondary | ICD-10-CM | POA: Diagnosis not present

## 2019-06-03 DIAGNOSIS — H35363 Drusen (degenerative) of macula, bilateral: Secondary | ICD-10-CM | POA: Diagnosis not present

## 2019-06-03 DIAGNOSIS — H35033 Hypertensive retinopathy, bilateral: Secondary | ICD-10-CM | POA: Diagnosis not present

## 2019-06-03 DIAGNOSIS — H402232 Chronic angle-closure glaucoma, bilateral, moderate stage: Secondary | ICD-10-CM | POA: Diagnosis not present

## 2019-07-01 ENCOUNTER — Telehealth: Payer: Self-pay

## 2019-07-01 MED ORDER — EZETIMIBE 10 MG PO TABS: 10.0000 mg | ORAL_TABLET | Freq: Every day | ORAL | 3 refills | Status: DC

## 2019-07-01 NOTE — Telephone Encounter (Signed)
Refill for zetia sent to [facility]

## 2019-07-01 NOTE — Telephone Encounter (Signed)
Ok to refill 

## 2019-07-01 NOTE — Telephone Encounter (Signed)
Refill request for Ezetimibi 10 mg for [id number]. Pt being seen by Dr. Johnsie Cancel, pls refill, tx

## 2019-07-01 NOTE — Addendum Note (Signed)
Addended by: Rodman Key on: 07/01/2019 12:05 PM   Modules accepted: Orders

## 2019-07-02 ENCOUNTER — Encounter: Payer: Self-pay | Admitting: Adult Health

## 2019-07-02 ENCOUNTER — Telehealth: Payer: Self-pay | Admitting: Cardiovascular Disease

## 2019-07-02 ENCOUNTER — Other Ambulatory Visit: Payer: Self-pay

## 2019-07-02 ENCOUNTER — Ambulatory Visit (INDEPENDENT_AMBULATORY_CARE_PROVIDER_SITE_OTHER): Payer: Medicare Other | Admitting: Adult Health

## 2019-07-02 DIAGNOSIS — J4530 Mild persistent asthma, uncomplicated: Secondary | ICD-10-CM | POA: Diagnosis not present

## 2019-07-02 DIAGNOSIS — J310 Chronic rhinitis: Secondary | ICD-10-CM

## 2019-07-02 DIAGNOSIS — I25810 Atherosclerosis of coronary artery bypass graft(s) without angina pectoris: Secondary | ICD-10-CM

## 2019-07-02 NOTE — Patient Instructions (Signed)
Continue Allegra 180mg  daily in am.  Continue on Symbicort Twice daily , rinse after use.  Continue on Singulair daily .  Continue on Flonase daily .  Follow with up 4 months and As needed   Please contact office for sooner follow up if symptoms do not improve or worsen or seek emergency care

## 2019-07-02 NOTE — Assessment & Plan Note (Signed)
Compensated   Plan  Patient Instructions  Continue Allegra 180mg  daily in am.  Continue on Symbicort Twice daily , rinse after use.  Continue on Singulair daily .  Continue on Flonase daily .  Follow with up 4 months and As needed   Please contact office for sooner follow up if symptoms do not improve or worsen or seek emergency care

## 2019-07-02 NOTE — Assessment & Plan Note (Signed)
Compensated on present regimen   Plan  Patient Instructions  Continue Allegra 180mg  daily in am.  Continue on Symbicort Twice daily , rinse after use.  Continue on Singulair daily .  Continue on Flonase daily .  Follow with up 4 months and As needed   Please contact office for sooner follow up if symptoms do not improve or worsen or seek emergency care

## 2019-07-02 NOTE — Telephone Encounter (Signed)
New message    Patient wants to know if a form was received to get his handicap sticker. Please advise.

## 2019-07-02 NOTE — Telephone Encounter (Signed)
I spoke to the patient and informed him that we apparently have not yet received the handicap form yet in the office for Dr Johnsie Cancel.  He will call Monday to check again.

## 2019-07-02 NOTE — Progress Notes (Signed)
@Patient  ID: Barry [name], male    DOB: [birth year], 73 y.o.   MRN: [id number]  Chief Complaint  Patient presents with  . 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  Chronic rhinitis and sinusitis Has Ulcerative Colitis -Followed by Dr. Wynetta Emery- on SulfaSalazine . Patient has underlying coronary artery disease with previous CABG.,  Hypertension and hyperlipidemia TEST  PFTs 06/15/2005 FEV1 85% predicted ratio 68% and truncation of respiratory loop in a sawtooth pattern  03/24/12 CT Sinus >Negative paranasal sinuses - Sinus CT 08/07/2013>>Short air-fluid levels in the maxillary sinuses bilaterally suggesting early acute sinusitis CT sinus 2015- for sinusitis PVX 2016  Prevnar 2015  07/02/2019 Follow up : Asthma, CR  Patient returns for a four-month follow-up.  Patient has mild persistent asthma.  He is on Symbicort twice daily.  He also has chronic rhinitis.  He takes Singulair, [employer] account planner daily.  Previous work-up showed eosinophils were okay on differential.  IgE was 5.  Allergy profile was negative.  Since last visit patient says overall breathing has been doing okay.  He denies a flare of cough or wheezing.  No increased albuterol use.  Says overall he is doing okay. Trying to stay home and social distance.   Has changed to Dr. Johnsie Cancel as his cardiologist has retired.  Says UC is doing well on Sulfasalazine.      Allergies  Allergen Reactions  . Piroxicam Itching and Rash    Immunization History  Administered Date(s) Administered  . Influenza Split 10/17/2012, 09/16/2014, 10/15/2015  . Influenza Whole 08/17/2009, 09/11/2010, 09/17/2011  . Influenza, High Dose Seasonal PF 09/16/2016, 09/25/2017, 09/10/2018  . Influenza,inj,Quad PF,6+ Mos 09/18/2013  . Pneumococcal  Conjugate-13 01/05/2014  . Pneumococcal Polysaccharide-23 11/16/2005, 11/01/2009  . Tdap 02/24/2015    Past Medical History:  Diagnosis Date  . A-fib ([place])   . Acute sinusitis 03/24/2012   Followed in Pulmonary clinic/ [facility]/ [name] - CT sinus 08/07/2013  Short air-fluid levels in the maxillary sinuses bilaterally suggesting early acute sinusitis. - repeat ct sinus 04/30/2014 > ok    . Acute sinusitis, unspecified 08/14/2010   Qualifier: Diagnosis of  By: [name], [name]    . Asthma    PFTs 06/15/05 FEV1 85% predicted ratio 68% and truncation of resp loop in a sawtooth pattern. HFA 25% 08-01-2009 >50% Nov 01, 2009>50% 01-31-2010  . Asthma, chronic 11/24/2007   Followed in Pulmonary clinic/ [facility]/ [name]  - PFTs 06/15/2005 FEV1 85% predicted ratio 68% and truncation of respiratory loop in a sawtooth pattern  - HFA 25% August 01, 2009 > 50% November 01, 2009 > 50% January 31, 2010 therefore changed to neb bud/brovana - HFA 75% p extensive coaching 08/07/2013  > insurance issues so try symbicort 160 2 bid instead of neb - 11/16/2015   p extensive  . BRONCHITIS, ACUTE 01/22/2011   Qualifier: Diagnosis of  By: [name], [name]    . CAD (coronary artery disease), native coronary artery 02/06/2019   S/p CABG 2542 complicated by paralyzed left hemidiaphram  . Cataract   . Chronic cough   . Chronic rhinitis 05/02/2009   Followed in Pulmonary clinic/ [facility]/ [name]    - 03/24/12 CT Sinus > Negative paranasal sinuses - Sinus CT 08/07/2013 >>Short air-fluid levels in the maxillary sinuses  bilaterally suggesting early acute sinusitis - Repeat augmentin x 21 days 03/27/2014 then sinus ct if not better  - flonase/afrin regimen 02/11/15 >>>     . Coronary heart disease   . COUGH, CHRONIC 11/24/2007   Followed in Pulmonary clinic/ [facility]/ [name]     . DDD (degenerative disc disease), lumbar   . 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 Excessive ear wax, bilateral 09/15/2018  . GASTROESOPHAGEAL REFLUX DISEASE 08/27/2008   Followed as Primary Care Patient/ GI/ Dr  Earle Gell    . Glaucoma   . Hyperlipidemia LDL goal <70 02/06/2019  . Hypertension   . Obesity   . Osteoarthritis   . 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 Retina disorder 2014  . Severe obesity (BMI >= 40) (HCC) 11/24/2007       - Target wt < 191 to get under BMI 30   . Ulcerative colitis   . ULCERATIVE COLITIS 11/24/2007   Followed as Primary Care Patient/ GI/ Dr  Earle Gell    . URI 04/09/2008   Qualifier: Diagnosis of  By: [name], [name]      Tobacco History: Social History   Tobacco Use  Smoking Status Never Smoker  Smokeless Tobacco Never Used   Counseling given: Not Answered   Outpatient Medications Prior to Visit  Medication Sig Dispense Refill  . acetaminophen (TYLENOL) 500 MG tablet Take per bottle as needed for arthritis    . aspirin 81 MG tablet Take 81 mg by mouth daily.    [name] atorvastatin (LIPITOR) 40 MG tablet Take 1 tablet (40 mg total) by mouth daily. 90 tablet 3  . budesonide-formoterol (SYMBICORT) 160-4.5 MCG/ACT inhaler TAKE 2 PUFFS BY MOUTH TWICE A DAY 30.6 Inhaler 1  . carboxymethylcellulose (REFRESH PLUS) 0.5 % SOLN Use as needed for dry eye    . cholecalciferol (VITAMIN D) 1000 UNITS tablet Take 1,000 Units by mouth daily.    . cycloSPORINE (RESTASIS) 0.05 % ophthalmic emulsion Place 1 drop into both eyes 2 (two) times daily.    [name] Dextromethorphan-Guaifenesin (MUCINEX DM MAXIMUM STRENGTH) 60-1200 MG per 12 hr tablet Take 1 tablet by mouth every 12 (twelve) hours as needed (for cough or thick mucus).     [name] diltiazem (CARDIZEM CD) 240 MG 24 hr capsule Take 240 mg by mouth daily.    [name] diltiazem (DILACOR XR) 240 MG 24 hr capsule Take 1 capsule (240 mg total) by mouth daily. 90 capsule 3  . docusate sodium (COLACE) 100 MG capsule Take 100 mg by  mouth daily as needed for mild constipation.    . Eyelid Cleansers (OCUSOFT EYELID CLEANSING) PADS Apply topically. Use daily    . ezetimibe (ZETIA) 10 MG tablet Take 1 tablet (10 mg total) by mouth daily. 90 tablet 3  . fexofenadine (ALLEGRA) 180 MG tablet Take 180 mg by mouth daily.    . fluticasone (FLONASE) 50 MCG/ACT nasal spray Place 2 sprays into both nostrils 2 (two) times daily.     [name] latanoprost (XALATAN) 0.005 % ophthalmic solution Place 1 drop into both eyes at bedtime.    . metoprolol tartrate (LOPRESSOR) 25 MG tablet Take 1 tablet (25 mg total) by mouth 2 (two) times daily. 180 tablet 3  . montelukast (SINGULAIR) 10 MG tablet One at bedtime every night 30 tablet 11  . Multiple Vitamins-Minerals (CENTRUM SILVER PO) Take 1  tablet by mouth daily.    [name] omeprazole (PRILOSEC) 20 MG capsule Take 20 mg by mouth 2 (two) times daily before a meal.     . oxymetazoline (AFRIN) 0.05 % nasal spray Place 1 spray into both nostrils 2 (two) times daily as needed for congestion (for 5 days, then stop).     Vladimir Faster Glyc-Propyl Glyc PF (SYSTANE ULTRA PF) 0.4-0.3 % SOLN Apply 1 drop to eye as needed.    . polyethylene glycol (MIRALAX / GLYCOLAX) packet Take 17 g by mouth daily.    . Pseudoephedrine-Ibuprofen (ADVIL COLD/SINUS) 30-200 MG TABS Per bottle as needed for sinus pressure/pain    . sodium chloride (OCEAN) 0.65 % SOLN nasal spray Use as needed for nasal congestion    . Spacer/Aero-Holding Chambers (AEROCHAMBER MV) inhaler Use as instructed 1 each 0  . sulfaSALAzine (AZULFIDINE) 500 MG tablet Take 1,000 mg by mouth 4 (four) times daily.     . VENTOLIN HFA 108 (90 Base) MCG/ACT inhaler INHALE 2 PUFFS INTO THE LUNGS EVERY 4 HRS AS NEEDED FOR WHEEZING OR SHORTNESS OF BREATH 18 Inhaler 5   No facility-administered medications prior to visit.      Review of Systems:   Constitutional:   No  weight loss, night sweats,  Fevers, chills,  +fatigue, or  lassitude.  HEENT:   No headaches,   Difficulty swallowing,  Tooth/dental problems, or  Sore throat,                No sneezing, itching, ear ache, + nasal congestion, post nasal drip,   CV:  No chest pain,  Orthopnea, PND, swelling in lower extremities, anasarca, dizziness, palpitations, syncope.   GI  No heartburn, indigestion, abdominal pain, nausea, vomiting, diarrhea, change in bowel habits, loss of appetite, bloody stools.   Resp: no excess mucus, no productive cough,  No non-productive cough,  No coughing up of blood.  No change in color of mucus.  No wheezing.  No chest wall deformity  Skin: no rash or lesions.  GU: no dysuria, change in color of urine, no urgency or frequency.  No flank pain, no hematuria   MS:  No joint pain or swelling.  No decreased range of motion.  No back pain.    Physical Exam  BP 128/80 (BP Location: Left Arm, Patient Position: Sitting, Cuff Size: Large)   Pulse (!) 110   Temp 98.1 F (36.7 C) (Oral)   Ht 5\' 3"  (1.6 m)   Wt 233 lb 3.2 oz (105.8 kg)   SpO2 94%   BMI 41.31 kg/m   GEN: A/Ox3; pleasant , NAD, obese , walker    HEENT:  [place]/AT,  EACs-clear, TMs-wnl, NOSE-clear, THROAT-clear, no lesions, no postnasal drip or exudate noted.   NECK:  Supple w/ fair ROM; no JVD; normal carotid impulses w/o bruits; no thyromegaly or nodules palpated; no lymphadenopathy.    RESP  Clear  P & A; w/o, wheezes/ rales/ or rhonchi. no accessory muscle use, no dullness to percussion  CARD:  RRR, no m/r/g, tr  peripheral edema, pulses intact, no cyanosis or clubbing.  GI:   Soft & nt; nml bowel sounds; no organomegaly or masses detected.   Musco: Warm bil, chronic feet deformity, walker   Neuro: alert, no focal deficits noted.    Skin: Warm, no lesions or rashes    Lab Results:  CBC  BNP No results found for: BNP  ProBNP No results found for: PROBNP  Imaging: No results found.  No flowsheet data found.  No results found for: NITRICOXIDE      Assessment & Plan:    Asthma, chronic Compensated on present regimen   Plan  Patient Instructions  Continue Allegra 180mg  daily in am.  Continue on Symbicort Twice daily , rinse after use.  Continue on Singulair daily .  Continue on Flonase daily .  Follow with up 4 months and As needed   Please contact office for sooner follow up if symptoms do not improve or worsen or seek emergency care       Chronic rhinitis Compensated   Plan  Patient Instructions  Continue Allegra 180mg  daily in am.  Continue on Symbicort Twice daily , rinse after use.  Continue on Singulair daily .  Continue on Flonase daily .  Follow with up 4 months and As needed   Please contact office for sooner follow up if symptoms do not improve or worsen or seek emergency care          Rexene Edison, NP 07/02/2019

## 2019-07-03 NOTE — Progress Notes (Signed)
Chart and office note reviewed in detail  > agree with a/p as outlined    

## 2019-08-19 ENCOUNTER — Encounter (INDEPENDENT_AMBULATORY_CARE_PROVIDER_SITE_OTHER): Payer: Medicare Other | Admitting: Ophthalmology

## 2019-08-19 ENCOUNTER — Other Ambulatory Visit: Payer: Self-pay

## 2019-08-19 DIAGNOSIS — H35373 Puckering of macula, bilateral: Secondary | ICD-10-CM | POA: Diagnosis not present

## 2019-08-19 DIAGNOSIS — H35341 Macular cyst, hole, or pseudohole, right eye: Secondary | ICD-10-CM | POA: Diagnosis not present

## 2019-08-19 DIAGNOSIS — H35033 Hypertensive retinopathy, bilateral: Secondary | ICD-10-CM

## 2019-08-19 DIAGNOSIS — H353112 Nonexudative age-related macular degeneration, right eye, intermediate dry stage: Secondary | ICD-10-CM

## 2019-08-19 DIAGNOSIS — H43813 Vitreous degeneration, bilateral: Secondary | ICD-10-CM

## 2019-08-19 DIAGNOSIS — I10 Essential (primary) hypertension: Secondary | ICD-10-CM

## 2019-09-03 DIAGNOSIS — Z86010 Personal history of colonic polyps: Secondary | ICD-10-CM | POA: Diagnosis not present

## 2019-09-03 DIAGNOSIS — K5100 Ulcerative (chronic) pancolitis without complications: Secondary | ICD-10-CM | POA: Diagnosis not present

## 2019-09-03 DIAGNOSIS — K219 Gastro-esophageal reflux disease without esophagitis: Secondary | ICD-10-CM | POA: Diagnosis not present

## 2019-09-07 DIAGNOSIS — Z125 Encounter for screening for malignant neoplasm of prostate: Secondary | ICD-10-CM | POA: Diagnosis not present

## 2019-09-07 DIAGNOSIS — E7800 Pure hypercholesterolemia, unspecified: Secondary | ICD-10-CM | POA: Diagnosis not present

## 2019-09-07 DIAGNOSIS — K5100 Ulcerative (chronic) pancolitis without complications: Secondary | ICD-10-CM | POA: Diagnosis not present

## 2019-09-07 DIAGNOSIS — I10 Essential (primary) hypertension: Secondary | ICD-10-CM | POA: Diagnosis not present

## 2019-09-07 DIAGNOSIS — Z0000 Encounter for general adult medical examination without abnormal findings: Secondary | ICD-10-CM | POA: Diagnosis not present

## 2019-09-07 DIAGNOSIS — J45909 Unspecified asthma, uncomplicated: Secondary | ICD-10-CM | POA: Diagnosis not present

## 2019-09-07 DIAGNOSIS — Z23 Encounter for immunization: Secondary | ICD-10-CM | POA: Diagnosis not present

## 2019-09-07 DIAGNOSIS — I2510 Atherosclerotic heart disease of native coronary artery without angina pectoris: Secondary | ICD-10-CM | POA: Diagnosis not present

## 2019-09-07 DIAGNOSIS — Z1389 Encounter for screening for other disorder: Secondary | ICD-10-CM | POA: Diagnosis not present

## 2019-09-07 DIAGNOSIS — K219 Gastro-esophageal reflux disease without esophagitis: Secondary | ICD-10-CM | POA: Diagnosis not present

## 2019-09-07 DIAGNOSIS — I252 Old myocardial infarction: Secondary | ICD-10-CM | POA: Diagnosis not present

## 2019-09-08 ENCOUNTER — Other Ambulatory Visit: Payer: Self-pay | Admitting: Internal Medicine

## 2019-09-08 MED ORDER — MONTELUKAST SODIUM 10 MG PO TABS: ORAL_TABLET | ORAL | 11 refills | Status: DC

## 2019-09-25 ENCOUNTER — Other Ambulatory Visit: Payer: Self-pay | Admitting: Adult Health

## 2019-10-01 NOTE — Progress Notes (Signed)
Cardiology Office Note   Date:  10/02/2019   ID:  Barry, [name] [birth year], MRN [id number]  PCP:  [name], MD  Cardiologist:   Jenkins Rouge, MD   No chief complaint on file.     History of Present Illness: Barry [name] is a 73 y.o. male who was first seen in March 2020  regarding CAD Referred by Dr Lysle Rubens. Previous patient of Dr Wynonia Lawman He sees pulmonary for paralyzed left hemi diaphragm asthma and upper airway cough syndrome   History of CAD with CABG in August 2001 HTN and HLD on statin He has  UC and requires sulfasalazine and steriods  He has LE orthopedic issues and uses a walker Lives alone drives no real family.   No angina some fatigue and dyspnea   Past Medical History:  Diagnosis Date  . A-fib ([place])   . Acute sinusitis 03/24/2012   Followed in Pulmonary clinic/ [facility]/ [name] - CT sinus 08/07/2013  Short air-fluid levels in the maxillary sinuses bilaterally suggesting early acute sinusitis. - repeat ct sinus 04/30/2014 > ok    . Acute sinusitis, unspecified 08/14/2010   Qualifier: Diagnosis of  By: [name], [name]    . Asthma    PFTs 06/15/05 FEV1 85% predicted ratio 68% and truncation of resp loop in a sawtooth pattern. HFA 25% 08-01-2009 >50% Nov 01, 2009>50% 01-31-2010  . Asthma, chronic 11/24/2007   Followed in Pulmonary clinic/ [facility]/ [name]  - PFTs 06/15/2005 FEV1 85% predicted ratio 68% and truncation of respiratory loop in a sawtooth pattern  - HFA 25% August 01, 2009 > 50% November 01, 2009 > 50% January 31, 2010 therefore changed to neb bud/brovana - HFA 75% p extensive coaching 08/07/2013  > insurance issues so try symbicort 160 2 bid instead of neb - 11/16/2015   p extensive  . BRONCHITIS, ACUTE 01/22/2011   Qualifier: Diagnosis of  By: [name], [name]    . CAD (coronary artery disease), native coronary artery 02/06/2019   S/p CABG 99991111 complicated by paralyzed left hemidiaphram  . Cataract   . Chronic cough   . Chronic  rhinitis 05/02/2009   Followed in Pulmonary clinic/ [facility]/ [name]    - 03/24/12 CT Sinus > Negative paranasal sinuses - Sinus CT 08/07/2013 >>Short air-fluid levels in the maxillary sinuses bilaterally suggesting early acute sinusitis - Repeat augmentin x 21 days 03/27/2014 then sinus ct if not better  - flonase/afrin regimen 02/11/15 >>>     . Coronary heart disease   . COUGH, CHRONIC 11/24/2007   Followed in Pulmonary clinic/ [facility]/ [name]     . DDD (degenerative disc disease), lumbar   . 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   Followed in Pulmonary clinic/ [facility]/ [name]  -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    . Excessive ear wax, bilateral 09/15/2018  . GASTROESOPHAGEAL REFLUX DISEASE 08/27/2008   Followed as Primary Care Patient/ GI/ Dr  Earle Gell    . Glaucoma   . Hyperlipidemia LDL goal <70 02/06/2019  . Hypertension   . Obesity   . Osteoarthritis   . 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 Retina disorder 2014  . Severe obesity (BMI >= 40) (HCC) 11/24/2007       - Target wt < 191 to get under BMI 30   . Ulcerative colitis   . ULCERATIVE COLITIS 11/24/2007   Followed as Primary Care Patient/ GI/ Dr  Earle Gell    .  URI 04/09/2008   Qualifier: Diagnosis of  By: [name], [name]      Past Surgical History:  Procedure Laterality Date  . CARDIAC CATHETERIZATION Left   . COLONOSCOPY WITH PROPOFOL N/A 11/29/2015   Procedure: COLONOSCOPY WITH PROPOFOL;  Surgeon: [name], MD;  Location: [facility] ENDOSCOPY;  Service: Endoscopy;  Laterality: N/A;  . CORONARY ARTERY BYPASS GRAFT     x6     Current Outpatient Medications  Medication Sig Dispense Refill  . acetaminophen (TYLENOL) 500 MG tablet Take per bottle as needed for arthritis    . aspirin 81 MG tablet Take 81 mg by mouth daily.    [name] atorvastatin (LIPITOR) 40 MG tablet Take 1 tablet (40 mg total) by mouth daily. 90 tablet 3  . budesonide-formoterol (SYMBICORT) 160-4.5  MCG/ACT inhaler TAKE 2 PUFFS BY MOUTH TWICE A DAY 30.6 Inhaler 3  . carboxymethylcellulose (REFRESH PLUS) 0.5 % SOLN Use as needed for dry eye    . Cholecalciferol (VITAMIN D) 50 MCG (2000 UT) tablet Take 2,000 Units by mouth daily.     . cycloSPORINE (RESTASIS) 0.05 % ophthalmic emulsion Place 1 drop into both eyes 2 (two) times daily.    [name] Dextromethorphan-Guaifenesin (MUCINEX DM MAXIMUM STRENGTH) 60-1200 MG per 12 hr tablet Take 1 tablet by mouth every 12 (twelve) hours as needed (for cough or thick mucus).     [name] diltiazem (CARDIZEM CD) 240 MG 24 hr capsule Take 240 mg by mouth daily.    [name] docusate sodium (COLACE) 100 MG capsule Take 100 mg by mouth daily as needed for mild constipation.    . Eyelid Cleansers (OCUSOFT EYELID CLEANSING) PADS Apply topically. Use daily    . ezetimibe (ZETIA) 10 MG tablet Take 1 tablet (10 mg total) by mouth daily. 90 tablet 3  . fexofenadine (ALLEGRA) 180 MG tablet Take 180 mg by mouth daily.    . fluticasone (FLONASE) 50 MCG/ACT nasal spray Place 2 sprays into both nostrils 2 (two) times daily.     [name] latanoprost (XALATAN) 0.005 % ophthalmic solution Place 1 drop into both eyes at bedtime.    . metoprolol tartrate (LOPRESSOR) 25 MG tablet Take 1 tablet (25 mg total) by mouth 2 (two) times daily. 180 tablet 3  . montelukast (SINGULAIR) 10 MG tablet One at bedtime every night 30 tablet 11  . Multiple Vitamins-Minerals (CENTRUM SILVER PO) Take 1 tablet by mouth daily.    [name] omeprazole (PRILOSEC) 20 MG capsule Take 20 mg by mouth 2 (two) times daily before a meal.     . oxymetazoline (AFRIN) 0.05 % nasal spray Place 1 spray into both nostrils 2 (two) times daily as needed for congestion (for 5 days, then stop).     Vladimir Faster Glyc-Propyl Glyc PF (SYSTANE ULTRA PF) 0.4-0.3 % SOLN Apply 1 drop to eye as needed.    . polyethylene glycol (MIRALAX / GLYCOLAX) packet Take 17 g by mouth daily.    . Pseudoephedrine-Ibuprofen (ADVIL COLD/SINUS) 30-200 MG TABS Per bottle as  needed for sinus pressure/pain    . sodium chloride (OCEAN) 0.65 % SOLN nasal spray Use as needed for nasal congestion    . Spacer/Aero-Holding Chambers (AEROCHAMBER MV) inhaler Use as instructed 1 each 0  . sulfaSALAzine (AZULFIDINE) 500 MG tablet Take 1,000 mg by mouth 4 (four) times daily.     . VENTOLIN HFA 108 (90 Base) MCG/ACT inhaler INHALE 2 PUFFS INTO THE LUNGS EVERY 4 HRS AS NEEDED FOR WHEEZING OR SHORTNESS OF BREATH 18  Inhaler 5   No current facility-administered medications for this visit.     Allergies:   Piroxicam    Social History:  The patient  reports that he has never smoked. He has never used smokeless tobacco. He reports that he does not drink alcohol or use drugs.   Family History:  The patient's family history includes Heart disease in his father; Hypertension in his mother; Other in his sister; Pulmonary Hypertension in his mother; Renal Disease in his mother.    ROS:  Please see the history of present illness.   Otherwise, review of systems are positive for none.   All other systems are reviewed and negative.    PHYSICAL EXAM: VS:  BP (!) 162/72   Pulse 93   Ht 5\' 3"  (1.6 m)   Wt 238 lb (108 kg)   SpO2 94%   BMI 42.16 kg/m  , BMI Body mass index is 42.16 kg/m. Affect appropriate Overweight white male  HEENT: normal Neck supple with no adenopathy JVP normal no bruits no thyromegaly Lungs clear with no wheezing and good diaphragmatic motion Heart:  S1/S2 no murmur, no rub, gallop or click PMI normal Abdomen: benighn, BS positve, no tenderness, no AAA no bruit.  No HSM or HJR Distal pulses intact with no bruits No edema Neuro non-focal Skin warm and dry LE orthopedic alignment issues knees done with inner knee calluses     EKG:  NSR rate 90 normal ECG 02/19/19    Recent Labs: No results found for requested labs within last 8760 hours.    Lipid Panel No results found for: CHOL, TRIG, HDL, CHOLHDL, VLDL, LDLCALC, LDLDIRECT    Wt Readings from  Last 3 Encounters:  10/02/19 238 lb (108 kg)  07/02/19 233 lb 3.2 oz (105.8 kg)  01/23/19 231 lb 3.2 oz (104.9 kg)      Other studies Reviewed: Additional studies/ records that were reviewed today include: Notes from Dr Wynonia Lawman cardiology ECG labs CABG report 2001 notes from pulmonary .    ASSESSMENT AND PLAN:  1.  CAD/CABG:  2001  No angina continue medical Rx  2. HTN:  Well controlled.  Continue current medications and low sodium Dash type diet.   3. HLD:  Continue statin labs with primary  4. Pulmonary:  No active wheezing f/u [name] Parret [place] 5. UC:  Continue steroids and sulfa f/u Dr Wynetta Emery GI 6. Fatigue:  Will update echo to make sure EF still normal    Current medicines are reviewed at length with the patient today.  The patient does not have concerns regarding medicines.  The following changes have been made:  no change  Labs/ tests ordered today include Echo   Orders Placed This Encounter  Procedures  . ECHOCARDIOGRAM COMPLETE     Disposition:   FU with cardiology in a year      Signed, Jenkins Rouge, MD  10/02/2019 11:20 AM    [address], [place], [place]  [id number] Phone: [phone]; Fax: [phone]

## 2019-10-02 ENCOUNTER — Ambulatory Visit (INDEPENDENT_AMBULATORY_CARE_PROVIDER_SITE_OTHER): Payer: Medicare Other | Admitting: Cardiovascular Disease

## 2019-10-02 ENCOUNTER — Other Ambulatory Visit: Payer: Self-pay

## 2019-10-02 ENCOUNTER — Encounter: Payer: Self-pay | Admitting: Cardiovascular Disease

## 2019-10-02 ENCOUNTER — Encounter

## 2019-10-02 VITALS — BP 162/72 | HR 93 | Ht 63.0 in | Wt 238.0 lb

## 2019-10-02 DIAGNOSIS — I25810 Atherosclerosis of coronary artery bypass graft(s) without angina pectoris: Secondary | ICD-10-CM

## 2019-10-02 DIAGNOSIS — R5383 Other fatigue: Secondary | ICD-10-CM | POA: Diagnosis not present

## 2019-10-02 DIAGNOSIS — R0600 Dyspnea, unspecified: Secondary | ICD-10-CM

## 2019-10-02 NOTE — Patient Instructions (Addendum)
Medication Instructions:  Remember to take your Cardizem in the middle of the day.  *If you need a refill on your cardiac medications before your next appointment, please call your pharmacy*  Lab Work:  If you have labs (blood work) drawn today and your tests are completely normal, you will receive your results only by: [name] MyChart Message (if you have MyChart) OR . A paper copy in the mail If you have any lab test that is abnormal or we need to change your treatment, we will call you to review the results.  Testing/Procedures: Your physician has requested that you have an echocardiogram. Echocardiography is a painless test that uses sound waves to create images of your heart. It provides your doctor with information about the size and shape of your heart and how well your heart's chambers and valves are working. This procedure takes approximately one hour. There are no restrictions for this procedure.  Follow-Up: At [facility], you and your health needs are our priority.  As part of our continuing mission to provide you with exceptional heart care, we have created designated Provider Care Teams.  These Care Teams include your primary Cardiologist (physician) and Advanced Practice Providers (APPs -  Physician Assistants and Nurse Practitioners) who all work together to provide you with the care you need, when you need it.  Your next appointment:   6 to 8 weeks The format for your next appointment:   In Person  Provider:   You may see Dr. Johnsie Cancel or one of the following Advanced Practice Providers on your designated Care Team:    Truitt Merle, NP  Cecilie Kicks, NP  Kathyrn Drown, NP

## 2019-10-14 ENCOUNTER — Other Ambulatory Visit (HOSPITAL_COMMUNITY): Payer: Medicare Other

## 2019-10-20 ENCOUNTER — Encounter (HOSPITAL_COMMUNITY): Payer: Self-pay | Admitting: Cardiovascular Disease

## 2019-11-02 ENCOUNTER — Other Ambulatory Visit: Payer: Self-pay

## 2019-11-02 ENCOUNTER — Ambulatory Visit (INDEPENDENT_AMBULATORY_CARE_PROVIDER_SITE_OTHER): Payer: Medicare Other | Admitting: Adult Health

## 2019-11-02 ENCOUNTER — Encounter: Payer: Self-pay | Admitting: Adult Health

## 2019-11-02 VITALS — BP 122/70 | HR 74 | Temp 97.8°F | Ht 63.0 in | Wt 231.2 lb

## 2019-11-02 DIAGNOSIS — J310 Chronic rhinitis: Secondary | ICD-10-CM

## 2019-11-02 DIAGNOSIS — Z23 Encounter for immunization: Secondary | ICD-10-CM | POA: Diagnosis not present

## 2019-11-02 DIAGNOSIS — J4530 Mild persistent asthma, uncomplicated: Secondary | ICD-10-CM | POA: Diagnosis not present

## 2019-11-02 DIAGNOSIS — I25810 Atherosclerosis of coronary artery bypass graft(s) without angina pectoris: Secondary | ICD-10-CM | POA: Diagnosis not present

## 2019-11-02 NOTE — Patient Instructions (Signed)
Continue Allegra 180mg  daily in am.  Continue on Symbicort Twice daily , rinse after use.  Continue on Flonase daily .  Pneumovax vaccine today .  Follow with up 4 months and As needed   Please contact office for sooner follow up if symptoms do not improve or worsen or seek emergency care

## 2019-11-02 NOTE — Progress Notes (Signed)
@Patient  ID: Barry [name], male    DOB: 10/06/1946, 73 y.o.   MRN: [id number]  Chief Complaint  Patient presents with  . Follow-up    Asthma     Referring provider: [name], MD  HPI: 73 year old male never smoker with mild asthma, obesity, chronically paralyzed left hemidiaphragm, chronic rhinitis and sinusitis. Has ulcerative colitis followed by GI Dr. Wynetta Emery on sulfasalazine Has coronary artery disease with previous CABG, hypertension and hyperlipidemia  TEST/EVENTS :  PFTs 06/15/2005 FEV1 85% predicted ratio 68% and truncation of respiratory loop in a sawtooth pattern  03/24/12 CT Sinus >Negative paranasal sinuses - Sinus CT 08/07/2013>>Short air-fluid levels in the maxillary sinuses bilaterally suggesting early acute sinusitis CT sinus 2015- for sinusitis PVX 2016  Prevnar 2015  11/02/2019 Follow up : Asthma , CR  Patient presents for a 34-month follow-up.  Patient has mild persistent asthma.  He remains on Symbicort twice daily.  He has chronic rhinitis is on daily  Allegra and Flonase.  Stopped Singulair in last couple of months, did not have flare of symptoms off of it. Previous work-up showed eosinophils were okay.  IgE was 5.  Allergy profile was negative.  Since last visit patient says breathing has been doing patient denies any cough or wheezing.  No increased albuterol use. Says activity level is the same-sedentary . Uses a walker. Gets winded with activity  Has been having more fatigue than usual and has lower activity tolerance. Cardiology has set up echo this week.    Allergies  Allergen Reactions  . Piroxicam Itching and Rash    Immunization History  Administered Date(s) Administered  . Influenza Split 10/17/2012, 09/16/2014, 10/15/2015  . Influenza Whole 08/17/2009, 09/11/2010, 09/17/2011  . Influenza, High Dose Seasonal PF 09/16/2016, 09/25/2017, 09/10/2018, 09/02/2019  . Influenza,inj,Quad PF,6+ Mos 09/18/2013  . Pneumococcal Conjugate-13  01/05/2014  . Pneumococcal Polysaccharide-23 11/16/2005, 11/01/2009  . Tdap 02/24/2015  . Zoster Recombinat (Shingrix) 09/02/2019    Past Medical History:  Diagnosis Date  . A-fib ([place])   . Acute sinusitis 03/24/2012   Followed in Pulmonary clinic/ [facility]/ [name] - CT sinus 08/07/2013  Short air-fluid levels in the maxillary sinuses bilaterally suggesting early acute sinusitis. - repeat ct sinus 04/30/2014 > ok    . Acute sinusitis, unspecified 08/14/2010   Qualifier: Diagnosis of  By: [name], Gabrielly Mccrystal    . Asthma    PFTs 06/15/05 FEV1 85% predicted ratio 68% and truncation of resp loop in a sawtooth pattern. HFA 25% 08-01-2009 >50% Nov 01, 2009>50% 01-31-2010  . Asthma, chronic 11/24/2007   Followed in Pulmonary clinic/ [facility]/ [name]  - PFTs 06/15/2005 FEV1 85% predicted ratio 68% and truncation of respiratory loop in a sawtooth pattern  - HFA 25% August 01, 2009 > 50% November 01, 2009 > 50% January 31, 2010 therefore changed to neb bud/brovana - HFA 75% p extensive coaching 08/07/2013  > insurance issues so try symbicort 160 2 bid instead of neb - 11/16/2015   p extensive  . BRONCHITIS, ACUTE 01/22/2011   Qualifier: Diagnosis of  By: [name], Trivia Heffelfinger    . CAD (coronary artery disease), native coronary artery 02/06/2019   S/p CABG 99991111 complicated by paralyzed left hemidiaphram  . Cataract   . Chronic cough   . Chronic rhinitis 05/02/2009   Followed in Pulmonary clinic/ [facility]/ [name]    - 03/24/12 CT Sinus > Negative paranasal sinuses - Sinus CT 08/07/2013 >>Short air-fluid levels in the maxillary sinuses bilaterally suggesting early  acute sinusitis - Repeat augmentin x 21 days 03/27/2014 then sinus ct if not better  - flonase/afrin regimen 02/11/15 >>>     . Coronary heart disease   . COUGH, CHRONIC 11/24/2007   Followed in Pulmonary clinic/ [facility]/ [name]     . DDD (degenerative disc disease), lumbar   . 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   Followed in  Pulmonary clinic/ [facility]/ [name]  -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    . Excessive ear wax, bilateral 09/15/2018  . GASTROESOPHAGEAL REFLUX DISEASE 08/27/2008   Followed as Primary Care Patient/ GI/ Dr  Earle Gell    . Glaucoma   . Hyperlipidemia LDL goal <70 02/06/2019  . Hypertension   . Obesity   . Osteoarthritis   . 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 Retina disorder 2014  . Severe obesity (BMI >= 40) (HCC) 11/24/2007       - Target wt < 191 to get under BMI 30   . Ulcerative colitis   . ULCERATIVE COLITIS 11/24/2007   Followed as Primary Care Patient/ GI/ Dr  Earle Gell    . URI 04/09/2008   Qualifier: Diagnosis of  By: [name], Uel Davidow      Tobacco History: Social History   Tobacco Use  Smoking Status Never Smoker  Smokeless Tobacco Never Used   Counseling given: Not Answered   Outpatient Medications Prior to Visit  Medication Sig Dispense Refill  . acetaminophen (TYLENOL) 500 MG tablet Take per bottle as needed for arthritis    . aspirin 81 MG tablet Take 81 mg by mouth daily.    [name] atorvastatin (LIPITOR) 40 MG tablet Take 1 tablet (40 mg total) by mouth daily. 90 tablet 3  . budesonide-formoterol (SYMBICORT) 160-4.5 MCG/ACT inhaler TAKE 2 PUFFS BY MOUTH TWICE A DAY 30.6 Inhaler 3  . carboxymethylcellulose (REFRESH PLUS) 0.5 % SOLN Use as needed for dry eye    . Cholecalciferol (VITAMIN D) 50 MCG (2000 UT) tablet Take 2,000 Units by mouth daily.     . cycloSPORINE (RESTASIS) 0.05 % ophthalmic emulsion Place 1 drop into both eyes 2 (two) times daily.    [name] Dextromethorphan-Guaifenesin (MUCINEX DM MAXIMUM STRENGTH) 60-1200 MG per 12 hr tablet Take 1 tablet by mouth every 12 (twelve) hours as needed (for cough or thick mucus).     [name] diltiazem (CARDIZEM CD) 240 MG 24 hr capsule Take 240 mg by mouth daily.    [name] docusate sodium (COLACE) 100 MG capsule Take 100 mg by mouth daily as needed for mild constipation.    . Eyelid  Cleansers (OCUSOFT EYELID CLEANSING) PADS Apply topically. Use daily    . ezetimibe (ZETIA) 10 MG tablet Take 1 tablet (10 mg total) by mouth daily. 90 tablet 3  . fexofenadine (ALLEGRA) 180 MG tablet Take 180 mg by mouth daily.    . fluticasone (FLONASE) 50 MCG/ACT nasal spray Place 2 sprays into both nostrils 2 (two) times daily.     [name] latanoprost (XALATAN) 0.005 % ophthalmic solution Place 1 drop into both eyes at bedtime.    . metoprolol tartrate (LOPRESSOR) 25 MG tablet Take 1 tablet (25 mg total) by mouth 2 (two) times daily. 180 tablet 3  . Multiple Vitamins-Minerals (CENTRUM SILVER PO) Take 1 tablet by mouth daily.    [name] omeprazole (PRILOSEC) 20 MG capsule Take 20 mg by mouth 2 (two) times daily before a meal.     . oxymetazoline (AFRIN) 0.05 % nasal spray Place  1 spray into both nostrils 2 (two) times daily as needed for congestion (for 5 days, then stop).     Vladimir Faster Glyc-Propyl Glyc PF (SYSTANE ULTRA PF) 0.4-0.3 % SOLN Apply 1 drop to eye as needed.    . polyethylene glycol (MIRALAX / GLYCOLAX) packet Take 17 g by mouth daily.    . Pseudoephedrine-Ibuprofen (ADVIL COLD/SINUS) 30-200 MG TABS Per bottle as needed for sinus pressure/pain    . sodium chloride (OCEAN) 0.65 % SOLN nasal spray Use as needed for nasal congestion    . Spacer/Aero-Holding Chambers (AEROCHAMBER MV) inhaler Use as instructed 1 each 0  . sulfaSALAzine (AZULFIDINE) 500 MG tablet Take 1,000 mg by mouth 4 (four) times daily.     . VENTOLIN HFA 108 (90 Base) MCG/ACT inhaler INHALE 2 PUFFS INTO THE LUNGS EVERY 4 HRS AS NEEDED FOR WHEEZING OR SHORTNESS OF BREATH 18 Inhaler 5  . montelukast (SINGULAIR) 10 MG tablet One at bedtime every night (Patient not taking: Reported on 11/02/2019) 30 tablet 11   No facility-administered medications prior to visit.      Review of Systems:   Constitutional:   No  weight loss, night sweats,  Fevers, chills, fatigue, or  lassitude.  HEENT:   No headaches,  Difficulty swallowing,   Tooth/dental problems, or  Sore throat,                No sneezing, itching, ear ache,  +nasal congestion, post nasal drip,   CV:  No chest pain,  Orthopnea, PND, +swelling in lower extremities,  No anasarca, dizziness, palpitations, syncope.   GI  No heartburn, indigestion, abdominal pain, nausea, vomiting, diarrhea, change in bowel habits, loss of appetite, bloody stools.   Resp:  No excess mucus, no productive cough,  No non-productive cough,  No coughing up of blood.  No change in color of mucus.  No wheezing.  No chest wall deformity  Skin: no rash or lesions.  GU: no dysuria, change in color of urine, no urgency or frequency.  No flank pain, no hematuria   MS:  Chronic LE joint issues, walker.     Physical Exam  BP 122/70 (BP Location: Left Arm, Cuff Size: Normal)   Pulse 74   Temp 97.8 F (36.6 C) (Temporal)   Ht 5\' 3"  (1.6 m)   Wt 231 lb 3.2 oz (104.9 kg)   SpO2 97%   BMI 40.96 kg/m   GEN: A/Ox3; pleasant , NAD, Obese per BMI    HEENT:  [place]/AT,  NOSE-clear, THROAT-clear, no lesions, no postnasal drip or exudate noted.   NECK:  Supple w/ fair ROM; no JVD; normal carotid impulses w/o bruits; no thyromegaly or nodules palpated; no lymphadenopathy.    RESP  Clear  P & A; w/o, wheezes/ rales/ or rhonchi. no accessory muscle use, no dullness to percussion  CARD:  RRR, no m/r/g, tr peripheral edema, pulses intact, no cyanosis or clubbing.  GI:   Soft & nt; nml bowel sounds; no organomegaly or masses detected.   [place]co: Warm bil,  Walks with walker, chronic LE deformities   Neuro: alert, no focal deficits noted.    Skin: Warm, no lesions or rashes    Lab Results:  CBC  BNP No results found for: BNP  ProBNP No results found for: PROBNP  Imaging: No results found.    No flowsheet data found.  No results found for: NITRICOXIDE      Assessment & Plan:   Asthma, chronic Stable -No flare  of symptoms off Singulair   Plan  Patient Instructions   Continue Allegra 180mg  daily in am.  Continue on Symbicort Twice daily , rinse after use.  Continue on Flonase daily .  Pneumovax vaccine today .  Follow with up 4 months and As needed   Please contact office for sooner follow up if symptoms do not improve or worsen or seek emergency care         Chronic rhinitis No flare off Singulair   Plan  Patient Instructions  Continue Allegra 180mg  daily in am.  Continue on Symbicort Twice daily , rinse after use.  Continue on Flonase daily .  Pneumovax vaccine today .  Follow with up 4 months and As needed   Please contact office for sooner follow up if symptoms do not improve or worsen or seek emergency care     '     Rafaella Kole, NP 11/02/2019

## 2019-11-02 NOTE — Addendum Note (Signed)
Addended by: Parke Poisson E on: 11/02/2019 12:02 PM   Modules accepted: Orders

## 2019-11-02 NOTE — Assessment & Plan Note (Signed)
No flare off Singulair   Plan  Patient Instructions  Continue Allegra 180mg  daily in am.  Continue on Symbicort Twice daily , rinse after use.  Continue on Flonase daily .  Pneumovax vaccine today .  Follow with up 4 months and As needed   Please contact office for sooner follow up if symptoms do not improve or worsen or seek emergency care     '

## 2019-11-02 NOTE — Assessment & Plan Note (Signed)
Stable -No flare of symptoms off Singulair   Plan  Patient Instructions  Continue Allegra 180mg  daily in am.  Continue on Symbicort Twice daily , rinse after use.  Continue on Flonase daily .  Pneumovax vaccine today .  Follow with up 4 months and As needed   Please contact office for sooner follow up if symptoms do not improve or worsen or seek emergency care

## 2019-11-06 ENCOUNTER — Other Ambulatory Visit: Payer: Self-pay

## 2019-11-06 ENCOUNTER — Ambulatory Visit (HOSPITAL_COMMUNITY): Payer: Medicare Other | Attending: Cardiovascular Disease

## 2019-11-06 DIAGNOSIS — R0600 Dyspnea, unspecified: Secondary | ICD-10-CM | POA: Insufficient documentation

## 2019-11-06 DIAGNOSIS — R5383 Other fatigue: Secondary | ICD-10-CM | POA: Diagnosis not present

## 2019-11-18 ENCOUNTER — Telehealth: Payer: Self-pay | Admitting: Cardiovascular Disease

## 2019-11-18 NOTE — Telephone Encounter (Signed)
Patient requesting wife to come with him to his appt on 11/26/19 with Dr. Johnsie Cancel. States he has a hard time moving around.

## 2019-11-18 NOTE — Telephone Encounter (Signed)
Called patient about his message. Informed patient of visitor policy.

## 2019-11-24 NOTE — Progress Notes (Signed)
Cardiology Office Note   Date:  11/26/2019   ID:  Denvil, Alexiou 12-25-1945, MRN [id number]  PCP:  [name], MD  Cardiologist:   Jenkins Rouge, MD   No chief complaint on file.     History of Present Illness: Barry [name] is a 73 y.o. male who was first seen in March 2020  regarding CAD Referred by Dr Lysle Rubens. Previous patient of Dr Wynonia Lawman He sees pulmonary for paralyzed left hemi diaphragm asthma and upper airway cough syndrome   History of CAD with CABG in August 2001 HTN and HLD on statin He has  UC and requires sulfasalazine and steriods  He has LE orthopedic issues and uses a walker Lives alone drives no real family.   No angina some fatigue and dyspnea  Sees pulmonary for paralyzed left hemi diaphragm and asthma  Uses Symbicort and allegra   Echo 11/06/19 done for fatigue showed EF 50-55% with no pulmonary HTN and AV sclerosis  Seems to be weaker Legs gave out about a week ago and he fell using his walker    Past Medical History:  Diagnosis Date  . A-fib ([place])   . Acute sinusitis 03/24/2012   Followed in Pulmonary clinic/ [facility]/ [name] - CT sinus 08/07/2013  Short air-fluid levels in the maxillary sinuses bilaterally suggesting early acute sinusitis. - repeat ct sinus 04/30/2014 > ok    . Acute sinusitis, unspecified 08/14/2010   Qualifier: Diagnosis of  By: [name], [name]    . Asthma    PFTs 06/15/05 FEV1 85% predicted ratio 68% and truncation of resp loop in a sawtooth pattern. HFA 25% 08-01-2009 >50% Nov 01, 2009>50% 01-31-2010  . Asthma, chronic 11/24/2007   Followed in Pulmonary clinic/ [facility]/ [name]  - PFTs 06/15/2005 FEV1 85% predicted ratio 68% and truncation of respiratory loop in a sawtooth pattern  - HFA 25% August 01, 2009 > 50% November 01, 2009 > 50% January 31, 2010 therefore changed to neb bud/brovana - HFA 75% p extensive coaching 08/07/2013  > insurance issues so try symbicort 160 2 bid instead of neb - 11/16/2015   p  extensive  . BRONCHITIS, ACUTE 01/22/2011   Qualifier: Diagnosis of  By: [name], [name]    . CAD (coronary artery disease), native coronary artery 02/06/2019   S/p CABG 99991111 complicated by paralyzed left hemidiaphram  . Cataract   . Chronic cough   . Chronic rhinitis 05/02/2009   Followed in Pulmonary clinic/ [facility]/ [name]    - 03/24/12 CT Sinus > Negative paranasal sinuses - Sinus CT 08/07/2013 >>Short air-fluid levels in the maxillary sinuses bilaterally suggesting early acute sinusitis - Repeat augmentin x 21 days 03/27/2014 then sinus ct if not better  - flonase/afrin regimen 02/11/15 >>>     . Coronary heart disease   . COUGH, CHRONIC 11/24/2007   Followed in Pulmonary clinic/ [facility]/ [name]     . DDD (degenerative disc disease), lumbar   . 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   Followed in Pulmonary clinic/ [facility]/ [name]  -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    . Excessive ear wax, bilateral 09/15/2018  . GASTROESOPHAGEAL REFLUX DISEASE 08/27/2008   Followed as Primary Care Patient/ GI/ Dr  Earle Gell    . Glaucoma   . Hyperlipidemia LDL goal <70 02/06/2019  . Hypertension   . Obesity   . Osteoarthritis   . 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 Retina disorder 2014  .  Severe obesity (BMI >= 40) (HCC) 11/24/2007       - Target wt < 191 to get under BMI 30   . Ulcerative colitis   . ULCERATIVE COLITIS 11/24/2007   Followed as Primary Care Patient/ GI/ Dr  Earle Gell    . URI 04/09/2008   Qualifier: Diagnosis of  By: [name], [name]      Past Surgical History:  Procedure Laterality Date  . CARDIAC CATHETERIZATION Left   . COLONOSCOPY WITH PROPOFOL N/A 11/29/2015   Procedure: COLONOSCOPY WITH PROPOFOL;  Surgeon: [name], MD;  Location: [facility];  Service: Endoscopy;  Laterality: N/A;  . CORONARY ARTERY BYPASS GRAFT     x6     Current Outpatient Medications  Medication Sig Dispense Refill  . acetaminophen  (TYLENOL) 500 MG tablet Take per bottle as needed for arthritis    . aspirin 81 MG tablet Take 81 mg by mouth daily.    [name] atorvastatin (LIPITOR) 40 MG tablet Take 1 tablet (40 mg total) by mouth daily. 90 tablet 3  . budesonide-formoterol (SYMBICORT) 160-4.5 MCG/ACT inhaler TAKE 2 PUFFS BY MOUTH TWICE A DAY 30.6 Inhaler 3  . carboxymethylcellulose (REFRESH PLUS) 0.5 % SOLN Use as needed for dry eye    . Cholecalciferol (VITAMIN D) 50 MCG (2000 UT) tablet Take 2,000 Units by mouth daily.     . cycloSPORINE (RESTASIS) 0.05 % ophthalmic emulsion Place 1 drop into both eyes 2 (two) times daily.    [name] Dextromethorphan-Guaifenesin (MUCINEX DM MAXIMUM STRENGTH) 60-1200 MG per 12 hr tablet Take 1 tablet by mouth every 12 (twelve) hours as needed (for cough or thick mucus).     [name] diltiazem (CARDIZEM CD) 240 MG 24 hr capsule Take 240 mg by mouth daily.    [name] docusate sodium (COLACE) 100 MG capsule Take 100 mg by mouth daily as needed for mild constipation.    . Eyelid Cleansers (OCUSOFT EYELID CLEANSING) PADS Apply topically. Use daily    . ezetimibe (ZETIA) 10 MG tablet Take 1 tablet (10 mg total) by mouth daily. 90 tablet 3  . fexofenadine (ALLEGRA) 180 MG tablet Take 180 mg by mouth daily.    . fluticasone (FLONASE) 50 MCG/ACT nasal spray Place 2 sprays into both nostrils 2 (two) times daily.     [name] latanoprost (XALATAN) 0.005 % ophthalmic solution Place 1 drop into both eyes at bedtime.    . metoprolol tartrate (LOPRESSOR) 25 MG tablet Take 1 tablet (25 mg total) by mouth 2 (two) times daily. 180 tablet 3  . montelukast (SINGULAIR) 10 MG tablet One at bedtime every night 30 tablet 11  . Multiple Vitamins-Minerals (CENTRUM SILVER PO) Take 1 tablet by mouth daily.    [name] omeprazole (PRILOSEC) 20 MG capsule Take 20 mg by mouth 2 (two) times daily before a meal.     . oxymetazoline (AFRIN) 0.05 % nasal spray Place 1 spray into both nostrils 2 (two) times daily as needed for congestion (for 5 days, then stop).      Vladimir Faster Glyc-Propyl Glyc PF (SYSTANE ULTRA PF) 0.4-0.3 % SOLN Apply 1 drop to eye as needed.    . polyethylene glycol (MIRALAX / GLYCOLAX) packet Take 17 g by mouth daily.    . Pseudoephedrine-Ibuprofen (ADVIL COLD/SINUS) 30-200 MG TABS Per bottle as needed for sinus pressure/pain    . sodium chloride (OCEAN) 0.65 % SOLN nasal spray Use as needed for nasal congestion    . Spacer/Aero-Holding Chambers (AEROCHAMBER MV) inhaler Use as  instructed 1 each 0  . sulfaSALAzine (AZULFIDINE) 500 MG tablet Take 1,000 mg by mouth 4 (four) times daily.     . VENTOLIN HFA 108 (90 Base) MCG/ACT inhaler INHALE 2 PUFFS INTO THE LUNGS EVERY 4 HRS AS NEEDED FOR WHEEZING OR SHORTNESS OF BREATH 18 Inhaler 5   No current facility-administered medications for this visit.    Allergies:   Piroxicam    Social History:  The patient  reports that he has never smoked. He has never used smokeless tobacco. He reports that he does not drink alcohol or use drugs.   Family History:  The patient's family history includes Heart disease in his father; Hypertension in his mother; Other in his sister; Pulmonary Hypertension in his mother; Renal Disease in his mother.    ROS:  Please see the history of present illness.   Otherwise, review of systems are positive for none.   All other systems are reviewed and negative.    PHYSICAL EXAM: VS:  BP (!) 146/78   Pulse 80   Ht 5\' 3"  (1.6 m)   Wt 234 lb (106.1 kg)   SpO2 98%   BMI 41.45 kg/m  , BMI Body mass index is 41.45 kg/m. Affect appropriate Overweight white male  HEENT: normal Neck supple with no adenopathy JVP normal no bruits no thyromegaly Lungs clear with no wheezing and good diaphragmatic motion Heart:  S1/S2 no murmur, no rub, gallop or click PMI normal Abdomen: benighn, BS positve, no tenderness, no AAA no bruit.  No HSM or HJR Distal pulses intact with no bruits No edema Neuro non-focal Skin warm and dry LE orthopedic alignment issues knees down   with inner knee calluses     EKG:  NSR rate 90 normal ECG 02/19/19    Recent Labs: No results found for requested labs within last 8760 hours.    Lipid Panel No results found for: CHOL, TRIG, HDL, CHOLHDL, VLDL, LDLCALC, LDLDIRECT    Wt Readings from Last 3 Encounters:  11/26/19 234 lb (106.1 kg)  11/02/19 231 lb 3.2 oz (104.9 kg)  10/02/19 238 lb (108 kg)      Other studies Reviewed: Additional studies/ records that were reviewed today include: Notes from Dr Wynonia Lawman cardiology ECG labs CABG report 2001 notes from pulmonary .    ASSESSMENT AND PLAN:  1. CAD/CABG:  2001  No angina continue medical Rx  2. HTN:  Well controlled.  Continue current medications and low sodium Dash type diet.   3. HLD:  Continue statin labs with primary  4. Pulmonary:  No active wheezing f/u [name] Parret Leslie continue Symbicort, Allegra and Flonase  5. UC:  Continue steroids and sulfa f/u Dr Wynetta Emery GI 6. Fatigue:  EF 50-55% by echo 11/06/19 non cardiac f/u primary Told him to f/u with primary update lab work including TSH He needs PT/OT and a new rolling walker in regard to his instability ambulating Told to f/u with primary for this    Current medicines are reviewed at length with the patient today.  The patient does not have concerns regarding medicines.  The following changes have been made:  no change  Labs/ tests ordered today include None  No orders of the defined types were placed in this encounter.    Disposition:   FU with cardiology in a year      Signed, Jenkins Rouge, MD  11/26/2019 10:53 AM    [address], [place], [place]  [address]  Phone: [phone]; Fax: [phone]

## 2019-11-26 ENCOUNTER — Ambulatory Visit (INDEPENDENT_AMBULATORY_CARE_PROVIDER_SITE_OTHER): Payer: Medicare Other | Admitting: Cardiovascular Disease

## 2019-11-26 ENCOUNTER — Other Ambulatory Visit: Payer: Self-pay

## 2019-11-26 ENCOUNTER — Encounter: Payer: Self-pay | Admitting: Cardiovascular Disease

## 2019-11-26 VITALS — BP 146/78 | HR 80 | Ht 63.0 in | Wt 234.0 lb

## 2019-11-26 DIAGNOSIS — Z951 Presence of aortocoronary bypass graft: Secondary | ICD-10-CM | POA: Diagnosis not present

## 2019-11-26 DIAGNOSIS — I25810 Atherosclerosis of coronary artery bypass graft(s) without angina pectoris: Secondary | ICD-10-CM

## 2019-11-26 NOTE — Patient Instructions (Addendum)
Medication Instructions:  *If you need a refill on your cardiac medications before your next appointment, please call your pharmacy*  Lab Work: If you have labs (blood work) drawn today and your tests are completely normal, you will receive your results only by: . MyChart Message (if you have MyChart) OR . A paper copy in the mail If you have any lab test that is abnormal or we need to change your treatment, we will call you to review the results.  Testing/Procedures: None ordered today.  Follow-Up: At [facility], you and your health needs are our priority.  As part of our continuing mission to provide you with exceptional heart care, we have created designated Provider Care Teams.  These Care Teams include your primary Cardiologist (physician) and Advanced Practice Providers (APPs -  Physician Assistants and Nurse Practitioners) who all work together to provide you with the care you need, when you need it.  Your next appointment:   12 month(s)  The format for your next appointment:   In Person  Provider:   You may see Dr. Nishan or one of the following Advanced Practice Providers on your designated Care Team:    Lori Gerhardt, NP  Laura Ingold, NP  Jill McDaniel, NP   

## 2020-01-02 ENCOUNTER — Ambulatory Visit: Payer: Medicare Other | Attending: Internal Medicine

## 2020-01-02 DIAGNOSIS — Z23 Encounter for immunization: Secondary | ICD-10-CM | POA: Diagnosis not present

## 2020-01-02 NOTE — Progress Notes (Signed)
   Covid-19 Vaccination Clinic  Name:  Barry [name]    MRN: [id number] DOB: 1946/08/11  01/02/2020  Barry [name] was observed post Covid-19 immunization for 15 minutes without incidence. He was provided with Vaccine Information Sheet and instruction to access the V-Safe system.   Barry [name] was instructed to call 911 with any severe reactions post vaccine: [name] Difficulty breathing  . Swelling of your face and throat  . A fast heartbeat  . A bad rash all over your body  . Dizziness and weakness    Immunizations Administered    Name Date Dose VIS Date Route   Pfizer COVID-19 Vaccine 01/02/2020 11:21 AM 0.3 mL 11/27/2019 Intramuscular   Manufacturer: [place]   Lot: [id number]   [place]: [id number]

## 2020-01-14 DIAGNOSIS — H5010 Unspecified exotropia: Secondary | ICD-10-CM | POA: Diagnosis not present

## 2020-01-14 DIAGNOSIS — H402232 Chronic angle-closure glaucoma, bilateral, moderate stage: Secondary | ICD-10-CM | POA: Diagnosis not present

## 2020-01-21 ENCOUNTER — Ambulatory Visit: Payer: Medicare Other | Attending: Internal Medicine

## 2020-01-21 DIAGNOSIS — Z23 Encounter for immunization: Secondary | ICD-10-CM | POA: Insufficient documentation

## 2020-01-21 NOTE — Progress Notes (Signed)
   Covid-19 Vaccination Clinic  Name:  MARKISE TREMAIN    MRN: [id number] DOB: 06-14-1946  01/21/2020  Mr. Stilts was observed post Covid-19 immunization for 15 minutes without incidence. He was provided with Vaccine Information Sheet and instruction to access the V-Safe system.   Mr. Rowan was instructed to call 911 with any severe reactions post vaccine: [name] Difficulty breathing  . Swelling of your face and throat  . A fast heartbeat  . A bad rash all over your body  . Dizziness and weakness    Immunizations Administered    Name Date Dose VIS Date Route   Pfizer COVID-19 Vaccine 01/21/2020  2:26 PM 0.3 mL 11/27/2019 Intramuscular   Manufacturer: [place]   Lot: [id number]   [place]: [id number]

## 2020-02-29 ENCOUNTER — Other Ambulatory Visit: Payer: Self-pay

## 2020-02-29 ENCOUNTER — Ambulatory Visit (INDEPENDENT_AMBULATORY_CARE_PROVIDER_SITE_OTHER): Payer: Medicare Other | Admitting: Internal Medicine

## 2020-02-29 ENCOUNTER — Encounter: Payer: Self-pay | Admitting: Internal Medicine

## 2020-02-29 DIAGNOSIS — J4530 Mild persistent asthma, uncomplicated: Secondary | ICD-10-CM

## 2020-02-29 MED ORDER — MONTELUKAST SODIUM 10 MG PO TABS: ORAL_TABLET | ORAL | 11 refills | Status: DC

## 2020-02-29 NOTE — Progress Notes (Signed)
Subjective:   Patient ID: Barry Barry [name], male    DOB: Mar 11, 1946 .   MRN: [id number]   Brief patient profile:  74 yowm never smoker with relatively mild asthma but superimposed on obesity plus a chronically paralyzed left hemidiaphragm and a tendency to upper airway cough syndrome.    History of Present Illness  03/24/12 [name] ov for sinusitis > rx levaquin > sinus CT > Negative paranasal sinuses.    11/16/2015  f/u ov/Barry Barry [name] re: acute exac ab   Chief Complaint  Patient presents with  . Follow-up    Pt c/o increased chest congestion, cough with green mucus and some wheeze/SOB x5 days.    Baseline improves quit a bit between visits on symb 160 2bid maint rx /  not needing saba but started needing 3x daily and a hs x 5 days prior to OV  Ins setting of acute cough/ congestion  rec Augmentin 875 mg take one pill twice daily  X 10 days - Prednisone 10 mg take  4 each am x 2 days,   2 each am x 2 days,  1 each am x 2 days and stop  Remember to use afrin x 5 days before each dose of flonase Remember to use the flutter valve Work on inhaler technique:    Please schedule a follow up visit in 3 months but call sooner if needed to see Barry Barry [name]    02/14/2016 Barry [name] Follow up : Asthma and Chronic Rhinitis  Pt returns for 3 month follow up .  Complains over last 4 days , breathing is up & down. occ SOB. prod. cough brownish in color. occ. wheezing. occ. chest pain/tightness Had asthmatic bronchitis last ov, tx w/ augmentin and prednisone taper. He says he got over until last few days.  Denies chest pain , orthopnea, chest pain, edema or fever.  Remains on Symbicort and mucinex.  Takes chlortrimeton Three times a day  .  Noticed the trees are blooming having more drainage.  rec Omnicef 377m Twice daily  For 1 week  Prednisone taper over next week.  Eat yogurt while on antibiotics .  Mucinex DM Twice daily  As needed  Cough/congestion     06/13/2016  f/u ov/Barry Barry [name] re:  AB/ symbicort 160  2bid/flonase - using med calendar well  Does really well p pred each time he gets it then gradually worse over several weeks esp in am cough cough / congestion/ slt yellow mucus / slt more sob but rare need for saba  Worse now x sev weeks again same exact symptoms with also nasal congestion but not able to use afrin or hfa effectively - see a/p rec Prednisone 10 mg take  4 each am x 2 days,   2 each am x 2 days,  1 each am x 2 days and stop  Work on inhaler technique:  Please schedule a follow up visit in 3 months but call sooner if needed to See Barry for new med calendar and bring your spacer for training every visit - ? Add singulair ?    09/12/2018  f/u ov/Barry Barry [name] re: asthma  Chief Complaint  Patient presents with  . Follow-up    Breathing is overall doing well. He is using his albuterol inhaler 1 x daily on average. He has a prod cough with clear sputum.    Dyspnea:  Limited by knees > sob Cough: tends to be worse in am /clear mucus Sleeping: in lift chair 45 degrees SABA use: no  particular pattern 02: none  rec Singulair 10 mg daily take itEvery night If worse > Prednisone 10 mg take  4 each am x 2 days,   2 each am x 2 days,  1 each am x 2 days and stop  See Barry Barry [name] for ear lavage but use the otc kit first for about a week  -add needs allergy profile next ov     02/29/2020  f/u ov/Barry Barry [name] re: already had both covid vaccines  Chief Complaint  Patient presents with  . Follow-up    Breathing is overall doing well. He uses his albuterol inhaler 1-2 x per wk.   Dyspnea:  Limited by knees more than sob on 2 wheeled walker / freq falls  Cough: no flares  Sleeping: lift chair / x45 degrees  SABA use: not much 02: none   No obvious day to day or daytime variability or assoc excess/ purulent sputum or mucus plugs or hemoptysis or cp or chest tightness, subjective wheeze or overt sinus or hb symptoms.   Sleeping as above without nocturnal  or early am exacerbation  of respiratory   c/o's or need for noct saba. Also denies any obvious fluctuation of symptoms with weather or environmental changes or other aggravating or alleviating factors except as outlined above   No unusual exposure hx or h/o childhood pna/ asthma or knowledge of premature birth.  Current Allergies, Complete Past Medical History, Past Surgical History, Family History, and Social History were reviewed in [employer] record.  ROS  The following are not active complaints unless bolded Hoarseness, sore throat, dysphagia, dental problems, itching, sneezing,  nasal congestion or discharge of excess mucus or purulent secretions, ear ache,   fever, chills, sweats, unintended wt loss or wt gain, classically pleuritic or exertional cp,  orthopnea pnd or arm/hand swelling  or leg swelling, presyncope, palpitations, abdominal pain, anorexia, nausea, vomiting, diarrhea  or change in bowel habits or change in bladder habits, change in stools or change in urine, dysuria, hematuria,  rash, arthralgias, visual complaints, headache, numbness, weakness or ataxia or problems with walking or coordination,  change in mood or  memory.        Current Meds  Medication Sig  . acetaminophen (TYLENOL) 500 MG tablet Take per bottle as needed for arthritis  . aspirin 81 MG tablet Take 81 mg by mouth daily.  [name] atorvastatin (LIPITOR) 40 MG tablet Take 1 tablet (40 mg total) by mouth daily.  . budesonide-formoterol (SYMBICORT) 160-4.5 MCG/ACT inhaler TAKE 2 PUFFS BY MOUTH TWICE A DAY  . carboxymethylcellulose (REFRESH PLUS) 0.5 % SOLN Use as needed for dry eye  . Cholecalciferol (VITAMIN D) 50 MCG (2000 UT) tablet Take 2,000 Units by mouth daily.   . cycloSPORINE (RESTASIS) 0.05 % ophthalmic emulsion Place 1 drop into both eyes 2 (two) times daily.  [name] Dextromethorphan-Guaifenesin (MUCINEX DM MAXIMUM STRENGTH) 60-1200 MG per 12 hr tablet Take 1 tablet by mouth every 12 (twelve) hours as needed (for cough or thick  mucus).   [name] diltiazem (CARDIZEM CD) 240 MG 24 hr capsule Take 240 mg by mouth daily.  [name] docusate sodium (COLACE) 100 MG capsule Take 100 mg by mouth daily as needed for mild constipation.  . Eyelid Cleansers (OCUSOFT EYELID CLEANSING) PADS Apply topically. Use daily  . ezetimibe (ZETIA) 10 MG tablet Take 1 tablet (10 mg total) by mouth daily.  . fexofenadine (ALLEGRA) 180 MG tablet Take 180 mg by mouth daily.  . fluticasone (FLONASE) 50  MCG/ACT nasal spray Place 2 sprays into both nostrils 2 (two) times daily.   [name] latanoprost (XALATAN) 0.005 % ophthalmic solution Place 1 drop into both eyes at bedtime.  . metoprolol tartrate (LOPRESSOR) 25 MG tablet Take 1 tablet (25 mg total) by mouth 2 (two) times daily.  . Multiple Vitamins-Minerals (CENTRUM SILVER PO) Take 1 tablet by mouth daily.  [name] omeprazole (PRILOSEC) 20 MG capsule Take 20 mg by mouth 2 (two) times daily before a meal.   . oxymetazoline (AFRIN) 0.05 % nasal spray Place 1 spray into both nostrils 2 (two) times daily as needed for congestion (for 5 days, then stop).   Barry Barry [name] Glyc-Propyl Glyc PF (SYSTANE ULTRA PF) 0.4-0.3 % SOLN Apply 1 drop to eye as needed.  . Pseudoephedrine-Ibuprofen (ADVIL COLD/SINUS) 30-200 MG TABS Per bottle as needed for sinus pressure/pain  . sodium chloride (OCEAN) 0.65 % SOLN nasal spray Use as needed for nasal congestion  . Spacer/Aero-Holding Chambers (AEROCHAMBER MV) inhaler Use as instructed  . sulfaSALAzine (AZULFIDINE) 500 MG tablet Take 1,000 mg by mouth 4 (four) times daily.   . 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  Barry Barry [name] - Pneumovax age 15 November 01, 2009 (second shot) Barry Barry [name] 12/2013, pneumovax 11/02/2019  -med calendar 09/11/2012 , 01/05/14 , 05/12/2015     Family History:  Coronary Heart Disease, father  Hypertension, mother   Social History:  Patient never smoked.  no alcohol  single  no children  disabled: textiles         Objective:      amb obese slt hoarse wm nad   amb min hoarse obese wm nad   Vital signs reviewed  02/29/2020  - Note at rest 02 sats  95% on RA     02/29/2020     229  09/12/2018     231  Wt  233 06/06/11  >> 233 09/18/2013 > 11/05/2013 226 >231 01/06/2014 > 238 03/08/2014 > 03/26/2014 239 > 06/24/2014 238 >  02/11/15 233 >233 05/12/2015 >237 08/19/2015 > 11/16/2015  239 >234 02/14/2016 >  06/13/2016   234 >    HEENT : pt wearing mask not removed for exam due to covid -19 concerns.    NECK :  without JVD/Nodes/TM/ nl carotid upstrokes bilaterally   LUNGS: no acc muscle use,  Nl contour chest which is clear to A and P bilaterally without cough on insp or exp maneuvers   CV:  RRR  no s3 or murmur or increase in P2, and no edema   ABD:  soft and nontender with nl inspiratory excursion in the supine position. No bruits or organomegaly appreciated, bowel sounds nl  MS:  Awkward gait with severe valgus deformities both knees/ ext warm without  , calf tenderness, cyanosis or clubbing    SKIN: warm and  dry without lesions    NEURO:  alert, approp, nl sensorium with  no motor or cerebellar deficits apparent.             Assessment & Plan:

## 2020-02-29 NOTE — Patient Instructions (Addendum)
No change in medications   Please schedule a follow up visit in  6 months but call sooner if needed  - see [name] NP at next visit

## 2020-03-01 ENCOUNTER — Encounter: Payer: Self-pay | Admitting: Internal Medicine

## 2020-03-01 NOTE — Assessment & Plan Note (Signed)
Never smoker - PFTs 06/15/2005 FEV1 85% predicted ratio 68% and truncation of respiratory loop in a sawtooth pattern  - HFA 25% August 01, 2009 > 50% November 01, 2009 > 50% January 31, 2010 therefore changed to neb bud/brovana - HFA 75% p extensive coaching 08/07/2013  > insurance issues so try symbicort 160 2 bid instead of neb - 11/16/2015   p extensive coaching HFA effectiveness =    75% with  spacer  - 06/13/2016  After extensive coaching HFA effectiveness =    50% s spacer  - added singulair 09/12/2018  - allergy profile 09/19/18 IgE 5 , neg RAST , eos 0.1  All goals of chronic asthma control met including optimal function and elimination of symptoms with minimal need for rescue therapy on symbicort 160 plus better since added singulair > rec no change rx   Contingencies discussed in full including contacting this office immediately if not controlling the symptoms using the rule of two's.     >>> f/u q 6 m, sooner prn         Each maintenance medication was reviewed in detail including emphasizing most importantly the difference between maintenance and prns and under what circumstances the prns are to be triggered using an action plan format where appropriate.  Total time for H and P, chart review, counseling,   and generating customized AVS unique to this office visit / charting = 20 min

## 2020-03-02 ENCOUNTER — Other Ambulatory Visit: Payer: Self-pay | Admitting: Cardiovascular Disease

## 2020-03-02 MED ORDER — DILTIAZEM HCL ER COATED BEADS 240 MG PO CP24: 240.0000 mg | ORAL_CAPSULE | Freq: Every day | ORAL | 2 refills | Status: DC

## 2020-03-08 DIAGNOSIS — Z23 Encounter for immunization: Secondary | ICD-10-CM | POA: Diagnosis not present

## 2020-03-08 DIAGNOSIS — Z6839 Body mass index (BMI) 39.0-39.9, adult: Secondary | ICD-10-CM | POA: Diagnosis not present

## 2020-03-08 DIAGNOSIS — H6120 Impacted cerumen, unspecified ear: Secondary | ICD-10-CM | POA: Diagnosis not present

## 2020-03-08 DIAGNOSIS — I10 Essential (primary) hypertension: Secondary | ICD-10-CM | POA: Diagnosis not present

## 2020-03-08 DIAGNOSIS — K219 Gastro-esophageal reflux disease without esophagitis: Secondary | ICD-10-CM | POA: Diagnosis not present

## 2020-03-08 DIAGNOSIS — K5100 Ulcerative (chronic) pancolitis without complications: Secondary | ICD-10-CM | POA: Diagnosis not present

## 2020-03-08 DIAGNOSIS — I2510 Atherosclerotic heart disease of native coronary artery without angina pectoris: Secondary | ICD-10-CM | POA: Diagnosis not present

## 2020-06-21 ENCOUNTER — Other Ambulatory Visit: Payer: Self-pay

## 2020-06-21 MED ORDER — METOPROLOL TARTRATE 25 MG PO TABS: 25.0000 mg | ORAL_TABLET | Freq: Two times a day (BID) | ORAL | 1 refills | Status: DC

## 2020-06-23 DIAGNOSIS — Z1159 Encounter for screening for other viral diseases: Secondary | ICD-10-CM | POA: Diagnosis not present

## 2020-06-28 DIAGNOSIS — K5100 Ulcerative (chronic) pancolitis without complications: Secondary | ICD-10-CM | POA: Diagnosis not present

## 2020-06-28 DIAGNOSIS — K2970 Gastritis, unspecified, without bleeding: Secondary | ICD-10-CM | POA: Diagnosis not present

## 2020-06-28 DIAGNOSIS — Z13810 Encounter for screening for upper gastrointestinal disorder: Secondary | ICD-10-CM | POA: Diagnosis not present

## 2020-06-28 DIAGNOSIS — K6289 Other specified diseases of anus and rectum: Secondary | ICD-10-CM | POA: Diagnosis not present

## 2020-06-28 DIAGNOSIS — R531 Weakness: Secondary | ICD-10-CM | POA: Diagnosis not present

## 2020-06-28 DIAGNOSIS — K219 Gastro-esophageal reflux disease without esophagitis: Secondary | ICD-10-CM | POA: Diagnosis not present

## 2020-06-28 DIAGNOSIS — K6389 Other specified diseases of intestine: Secondary | ICD-10-CM | POA: Diagnosis not present

## 2020-06-28 DIAGNOSIS — D124 Benign neoplasm of descending colon: Secondary | ICD-10-CM | POA: Diagnosis not present

## 2020-06-28 DIAGNOSIS — K648 Other hemorrhoids: Secondary | ICD-10-CM | POA: Diagnosis not present

## 2020-06-28 DIAGNOSIS — K2930 Chronic superficial gastritis without bleeding: Secondary | ICD-10-CM | POA: Diagnosis not present

## 2020-06-28 DIAGNOSIS — D123 Benign neoplasm of transverse colon: Secondary | ICD-10-CM | POA: Diagnosis not present

## 2020-06-28 DIAGNOSIS — K5289 Other specified noninfective gastroenteritis and colitis: Secondary | ICD-10-CM | POA: Diagnosis not present

## 2020-06-28 DIAGNOSIS — K529 Noninfective gastroenteritis and colitis, unspecified: Secondary | ICD-10-CM | POA: Diagnosis not present

## 2020-07-04 DIAGNOSIS — K5289 Other specified noninfective gastroenteritis and colitis: Secondary | ICD-10-CM | POA: Diagnosis not present

## 2020-07-04 DIAGNOSIS — D124 Benign neoplasm of descending colon: Secondary | ICD-10-CM | POA: Diagnosis not present

## 2020-07-04 DIAGNOSIS — D123 Benign neoplasm of transverse colon: Secondary | ICD-10-CM | POA: Diagnosis not present

## 2020-07-04 DIAGNOSIS — K2930 Chronic superficial gastritis without bleeding: Secondary | ICD-10-CM | POA: Diagnosis not present

## 2020-07-08 DIAGNOSIS — Z961 Presence of intraocular lens: Secondary | ICD-10-CM | POA: Diagnosis not present

## 2020-07-08 DIAGNOSIS — H35363 Drusen (degenerative) of macula, bilateral: Secondary | ICD-10-CM | POA: Diagnosis not present

## 2020-07-08 DIAGNOSIS — H35373 Puckering of macula, bilateral: Secondary | ICD-10-CM | POA: Diagnosis not present

## 2020-07-08 DIAGNOSIS — H402232 Chronic angle-closure glaucoma, bilateral, moderate stage: Secondary | ICD-10-CM | POA: Diagnosis not present

## 2020-08-24 ENCOUNTER — Other Ambulatory Visit: Payer: Self-pay

## 2020-08-24 ENCOUNTER — Encounter (INDEPENDENT_AMBULATORY_CARE_PROVIDER_SITE_OTHER): Payer: Medicare Other | Admitting: Ophthalmology

## 2020-08-24 DIAGNOSIS — H35341 Macular cyst, hole, or pseudohole, right eye: Secondary | ICD-10-CM | POA: Diagnosis not present

## 2020-08-24 DIAGNOSIS — H35373 Puckering of macula, bilateral: Secondary | ICD-10-CM | POA: Diagnosis not present

## 2020-08-24 DIAGNOSIS — H35033 Hypertensive retinopathy, bilateral: Secondary | ICD-10-CM | POA: Diagnosis not present

## 2020-08-24 DIAGNOSIS — H43813 Vitreous degeneration, bilateral: Secondary | ICD-10-CM

## 2020-08-24 DIAGNOSIS — H353132 Nonexudative age-related macular degeneration, bilateral, intermediate dry stage: Secondary | ICD-10-CM | POA: Diagnosis not present

## 2020-08-24 DIAGNOSIS — I10 Essential (primary) hypertension: Secondary | ICD-10-CM | POA: Diagnosis not present

## 2020-08-31 ENCOUNTER — Encounter: Payer: Self-pay | Admitting: Adult Health

## 2020-08-31 ENCOUNTER — Ambulatory Visit (INDEPENDENT_AMBULATORY_CARE_PROVIDER_SITE_OTHER): Payer: Medicare Other | Admitting: Adult Health

## 2020-08-31 ENCOUNTER — Other Ambulatory Visit: Payer: Self-pay

## 2020-08-31 DIAGNOSIS — J310 Chronic rhinitis: Secondary | ICD-10-CM

## 2020-08-31 DIAGNOSIS — J4530 Mild persistent asthma, uncomplicated: Secondary | ICD-10-CM | POA: Diagnosis not present

## 2020-08-31 DIAGNOSIS — L219 Seborrheic dermatitis, unspecified: Secondary | ICD-10-CM

## 2020-08-31 MED ORDER — BUDESONIDE-FORMOTEROL FUMARATE 160-4.5 MCG/ACT IN AERO: INHALATION_SPRAY | RESPIRATORY_TRACT | 11 refills | Status: DC

## 2020-08-31 MED ORDER — KETOCONAZOLE 2 % EX SHAM: 1.0000 "application " | MEDICATED_SHAMPOO | CUTANEOUS | 0 refills | Status: DC

## 2020-08-31 NOTE — Patient Instructions (Addendum)
Use Nizoral Shampoo twice weekly for 1 month to scalp rash. If not improving or worsens will need referral to Dermatology .  Can apply hydrocortisone cream to area daily for 3 days to help with itching .   Continue Allegra 180mg  daily in am.  Continue on Symbicort Twice daily , rinse after use.  Continue on Flonase daily .  Flu shot when available   Follow with up 6 months with Dr. Melvyn Novas  and As needed   Please contact office for sooner follow up if symptoms do not improve or worsen or seek emergency care

## 2020-08-31 NOTE — Progress Notes (Signed)
@Patient  ID: Barry [name], male    DOB: November 12, 1946, 74 y.o.   MRN: [id number]  Chief Complaint  Patient presents with  . Follow-up    Asthma     Referring provider: [name], MD  HPI: 74 year old male never smoker followed for mild asthma, obesity, chronically paralyzed left hemidiaphragm, chronic rhinitis and sinusitis Patient has medical history significant for ulcerative colitis followed by gastroenterology on sulfasalazine, coronary disease with previous CABG, hypertension and hyperlipidemia  TEST/EVENTS :  PFTs 06/15/2005 FEV1 85% predicted ratio 68% and truncation of respiratory loop in a sawtooth pattern  03/24/12 CT Sinus >Negative paranasal sinuses - Sinus CT 08/07/2013>>Short air-fluid levels in the maxillary sinuses bilaterally suggesting early acute sinusitis CT sinus 2015- for sinusitis PVX 2016  Prevnar 2015   08/31/2020 Follow up : Asthma and CR  Patient presents for a 29-month follow-up.  Patient says overall he has been doing well.  He has underlying mild asthma.  Says it has been under good control.  He remains on Symbicort twice daily.  He does have chronic rhinitis and is on Allegra and Flonase.  Patient says he has had no flare symptoms.  Has no increased albuterol use.  No change in his activity level.  Patient does complain that he has had some irritation and skin itching along his scalp line especially in the back of his neck.  He denies any blisters, drainage or fever.   Allergies  Allergen Reactions  . Piroxicam Itching and Rash    Immunization History  Administered Date(s) Administered  . Influenza Split 10/17/2012, 09/16/2014, 10/15/2015  . Influenza Whole 08/17/2009, 09/11/2010, 09/17/2011  . Influenza, High Dose Seasonal PF 09/16/2016, 09/25/2017, 09/10/2018, 09/02/2019  . Influenza,inj,Quad PF,6+ Mos 09/18/2013  . PFIZER SARS-COV-2 Vaccination 01/02/2020, 01/21/2020  . Pneumococcal Conjugate-13 01/05/2014  . Pneumococcal  Polysaccharide-23 11/16/2005, 11/01/2009, 11/02/2019  . Tdap 02/24/2015  . Zoster Recombinat (Shingrix) 09/02/2019    Past Medical History:  Diagnosis Date  . A-fib ([place])   . Acute sinusitis 03/24/2012   Followed in Pulmonary clinic/ [facility]/ [name] - CT sinus 08/07/2013  Short air-fluid levels in the maxillary sinuses bilaterally suggesting early acute sinusitis. - repeat ct sinus 04/30/2014 > ok    . Acute sinusitis, unspecified 08/14/2010   Qualifier: Diagnosis of  By: [name], Caitlain Tweed    . Asthma    PFTs 06/15/05 FEV1 85% predicted ratio 68% and truncation of resp loop in a sawtooth pattern. HFA 25% 08-01-2009 >50% Nov 01, 2009>50% 01-31-2010  . Asthma, chronic 11/24/2007   Followed in Pulmonary clinic/ [facility]/ [name]  - PFTs 06/15/2005 FEV1 85% predicted ratio 68% and truncation of respiratory loop in a sawtooth pattern  - HFA 25% August 01, 2009 > 50% November 01, 2009 > 50% January 31, 2010 therefore changed to neb bud/brovana - HFA 75% p extensive coaching 08/07/2013  > insurance issues so try symbicort 160 2 bid instead of neb - 11/16/2015   p extensive  . BRONCHITIS, ACUTE 01/22/2011   Qualifier: Diagnosis of  By: [name], Salman Wellen    . CAD (coronary artery disease), native coronary artery 02/06/2019   S/p CABG 9381 complicated by paralyzed left hemidiaphram  . Cataract   . Chronic cough   . Chronic rhinitis 05/02/2009   Followed in Pulmonary clinic/  [facility]/ [name]    - 03/24/12 CT Sinus > Negative paranasal sinuses - Sinus CT 08/07/2013 >>Short air-fluid levels in the maxillary sinuses bilaterally suggesting early acute sinusitis - Repeat augmentin x  21 days 03/27/2014 then sinus ct if not better  - flonase/afrin regimen 02/11/15 >>>     . Coronary heart disease   . COUGH, CHRONIC 11/24/2007   Followed in Pulmonary clinic/ [facility]/ [name]     . DDD (degenerative disc disease), lumbar   . 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 Excessive ear wax, bilateral 09/15/2018  . GASTROESOPHAGEAL REFLUX DISEASE 08/27/2008   Followed as Primary Care Patient/ GI/ Dr  Earle Gell    . Glaucoma   . Hyperlipidemia LDL goal <70 02/06/2019  . Hypertension   . Obesity   . Osteoarthritis   . 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 Retina disorder 2014  . Severe obesity (BMI >= 40) (HCC) 11/24/2007       - Target wt < 191 to get under BMI 30   . Ulcerative colitis   . ULCERATIVE COLITIS 11/24/2007   Followed as Primary Care Patient/ GI/ Dr  Earle Gell    . URI 04/09/2008   Qualifier: Diagnosis of  By: [name], Yossef Gilkison      Tobacco History: Social History   Tobacco Use  Smoking Status Never Smoker  Smokeless Tobacco Never Used   Counseling given: Not Answered   Outpatient Medications Prior to Visit  Medication Sig Dispense Refill  . acetaminophen (TYLENOL) 500 MG tablet Take per bottle as needed for arthritis    . aspirin 81 MG tablet Take 81 mg by mouth daily.    [name] atorvastatin (LIPITOR) 40 MG tablet TAKE 1 TABLET BY MOUTH EVERY DAY 90 tablet 2  . carboxymethylcellulose (REFRESH PLUS) 0.5 % SOLN Use as needed for dry eye    . Cholecalciferol (VITAMIN D) 50 MCG (2000 UT) tablet Take 2,000 Units by mouth daily.     . cycloSPORINE (RESTASIS) 0.05 % ophthalmic emulsion Place 1 drop into both eyes 2 (two) times daily.    [name] Dextromethorphan-Guaifenesin (MUCINEX DM MAXIMUM STRENGTH) 60-1200 MG per 12 hr tablet Take 1 tablet by mouth every 12 (twelve) hours as needed (for cough or thick mucus).     [name] diltiazem (CARDIZEM CD) 240 MG 24 hr capsule Take 1 capsule (240 mg total) by mouth daily. 90 capsule 2  . docusate sodium (COLACE) 100 MG capsule Take 100 mg by mouth daily as needed for mild constipation.    . Eyelid Cleansers (OCUSOFT EYELID CLEANSING) PADS Apply topically. Use daily    . ezetimibe (ZETIA) 10 MG tablet Take 1 tablet  (10 mg total) by mouth daily. 90 tablet 3  . fexofenadine (ALLEGRA) 180 MG tablet Take 180 mg by mouth daily.    . fluticasone (FLONASE) 50 MCG/ACT nasal spray Place 2 sprays into both nostrils 2 (two) times daily.     [name] latanoprost (XALATAN) 0.005 % ophthalmic solution Place 1 drop into both eyes at bedtime.    . metoprolol tartrate (LOPRESSOR) 25 MG tablet Take 1 tablet (25 mg total) by mouth 2 (two) times daily. 180 tablet 1  . montelukast (SINGULAIR) 10 MG tablet One at bedtime every night 30 tablet 11  . Multiple Vitamins-Minerals (CENTRUM SILVER PO) Take 1 tablet by mouth daily.    [name] omeprazole (PRILOSEC) 20 MG capsule Take 20 mg by mouth 2 (two) times daily before a meal.     . oxymetazoline (AFRIN) 0.05 % nasal spray Place 1 spray into both nostrils 2 (two)  times daily as needed for congestion (for 5 days, then stop).     Vladimir Faster Glyc-Propyl Glyc PF (SYSTANE ULTRA PF) 0.4-0.3 % SOLN Apply 1 drop to eye as needed.    . Pseudoephedrine-Ibuprofen (ADVIL COLD/SINUS) 30-200 MG TABS Per bottle as needed for sinus pressure/pain    . sodium chloride (OCEAN) 0.65 % SOLN nasal spray Use as needed for nasal congestion    . Spacer/Aero-Holding Chambers (AEROCHAMBER MV) inhaler Use as instructed 1 each 0  . sulfaSALAzine (AZULFIDINE) 500 MG tablet Take 1,000 mg by mouth 4 (four) times daily.     . VENTOLIN HFA 108 (90 Base) MCG/ACT inhaler INHALE 2 PUFFS INTO THE LUNGS EVERY 4 HRS AS NEEDED FOR WHEEZING OR SHORTNESS OF BREATH 18 Inhaler 5  . budesonide-formoterol (SYMBICORT) 160-4.5 MCG/ACT inhaler TAKE 2 PUFFS BY MOUTH TWICE A DAY 30.6 Inhaler 3   No facility-administered medications prior to visit.     Review of Systems:   Constitutional:   No  weight loss, night sweats,  Fevers, chills, fatigue, or  lassitude.  HEENT:   No headaches,  Difficulty swallowing,  Tooth/dental problems, or  Sore throat,                No sneezing, itching, ear ache,  +nasal congestion, post nasal drip,   CV:   No chest pain,  Orthopnea, PND, swelling in lower extremities, anasarca, dizziness, palpitations, syncope.   GI  No heartburn, indigestion, abdominal pain, nausea, vomiting, diarrhea, change in bowel habits, loss of appetite, bloody stools.   Resp: No shortness of breath with exertion or at rest.  No excess mucus, no productive cough,  No non-productive cough,  No coughing up of blood.  No change in color of mucus.  No wheezing.  No chest wall deformity  Skin: Positive rash along the hairline  GU: no dysuria, change in color of urine, no urgency or frequency.  No flank pain, no hematuria   MS:  No joint pain or swelling.  No decreased range of motion.  No back pain.    Physical Exam  BP (!) 145/70 (BP Location: Left Arm, Cuff Size: Normal)   Pulse 100   Temp (!) 96.2 F (35.7 C) (Temporal)   Ht 5\' 3"  (1.6 m)   Wt 225 lb (102.1 kg)   SpO2 94% Comment: RA  BMI 39.86 kg/m   GEN: A/Ox3; pleasant , NAD, well nourished    HEENT:  [place]/AT,   NOSE-clear, THROAT-clear, no lesions, no postnasal drip or exudate noted.   NECK:  Supple w/ fair ROM; no JVD; normal carotid impulses w/o bruits; no thyromegaly or nodules palpated; no lymphadenopathy.    RESP  Clear  P & A; w/o, wheezes/ rales/ or rhonchi. no accessory muscle use, no dullness to percussion  CARD:  RRR, no m/r/g, tr peripheral edema, pulses intact, no cyanosis or clubbing.  GI:   Soft & nt; nml bowel sounds; no organomegaly or masses detected.   Musco: Warm bil, chronic extremity changes  Neuro: alert, no focal deficits noted.    Skin: Warm, along the posterior hairline/scalp-seborrheic dermatitis changes No significant redness or drainage noted   Lab Results:  CBC  BMET  ProBNP No results found for: PROBNP  Imaging: No results found.    No flowsheet data found.  No results found for: NITRICOXIDE      Assessment & Plan:   No problem-specific Assessment & Plan notes found for this  encounter.     Margues Filippini,  NP 08/31/2020

## 2020-09-02 DIAGNOSIS — L219 Seborrheic dermatitis, unspecified: Secondary | ICD-10-CM | POA: Insufficient documentation

## 2020-09-02 NOTE — Assessment & Plan Note (Signed)
Rash appears to be consistent with a seborrheic dermatitis.  Patient will use Nizoral shampoo  if not improving he is recommend to contact his primary care provider or dermatology for further evaluation  Plan  Patient Instructions  Use Nizoral Shampoo twice weekly for 1 month to scalp rash. If not improving or worsens will need referral to Dermatology .  Can apply hydrocortisone cream to area daily for 3 days to help with itching .   Continue Allegra 180mg  daily in am.  Continue on Symbicort Twice daily , rinse after use.  Continue on Flonase daily .  Flu shot when available   Follow with up 6 months with Dr. Melvyn Novas  and As needed   Please contact office for sooner follow up if symptoms do not improve or worsen or seek emergency care

## 2020-09-02 NOTE — Assessment & Plan Note (Signed)
Controlled on present regimen, no changes

## 2020-09-02 NOTE — Assessment & Plan Note (Signed)
Excellent control on current regimen no changes  Plan  Patient Instructions  Use Nizoral Shampoo twice weekly for 1 month to scalp rash. If not improving or worsens will need referral to Dermatology .  Can apply hydrocortisone cream to area daily for 3 days to help with itching .   Continue Allegra 180mg  daily in am.  Continue on Symbicort Twice daily , rinse after use.  Continue on Flonase daily .  Flu shot when available   Follow with up 6 months with Dr. Melvyn Novas  and As needed   Please contact office for sooner follow up if symptoms do not improve or worsen or seek emergency care

## 2020-09-06 ENCOUNTER — Ambulatory Visit: Payer: Self-pay

## 2020-09-06 ENCOUNTER — Ambulatory Visit: Payer: Medicare Other | Attending: Internal Medicine

## 2020-09-06 DIAGNOSIS — Z23 Encounter for immunization: Secondary | ICD-10-CM

## 2020-09-06 NOTE — Progress Notes (Signed)
   Covid-19 Vaccination Clinic  Name:  Barry [name]    MRN: [id number] DOB: 1946/04/12  09/06/2020  [name] was observed post Covid-19 immunization for 15 minutes without incident. He was provided with Vaccine Information Sheet and instruction to access the V-Safe system.   [name] was instructed to call 911 with any severe reactions post vaccine: [name] Difficulty breathing  . Swelling of face and throat  . A fast heartbeat  . A bad rash all over body  . Dizziness and weakness

## 2020-09-13 DIAGNOSIS — H6123 Impacted cerumen, bilateral: Secondary | ICD-10-CM | POA: Diagnosis not present

## 2020-10-10 DIAGNOSIS — Z23 Encounter for immunization: Secondary | ICD-10-CM | POA: Diagnosis not present

## 2020-10-31 DIAGNOSIS — I252 Old myocardial infarction: Secondary | ICD-10-CM | POA: Diagnosis not present

## 2020-10-31 DIAGNOSIS — K219 Gastro-esophageal reflux disease without esophagitis: Secondary | ICD-10-CM | POA: Diagnosis not present

## 2020-10-31 DIAGNOSIS — J45909 Unspecified asthma, uncomplicated: Secondary | ICD-10-CM | POA: Diagnosis not present

## 2020-10-31 DIAGNOSIS — R21 Rash and other nonspecific skin eruption: Secondary | ICD-10-CM | POA: Diagnosis not present

## 2020-10-31 DIAGNOSIS — Z1389 Encounter for screening for other disorder: Secondary | ICD-10-CM | POA: Diagnosis not present

## 2020-10-31 DIAGNOSIS — M1990 Unspecified osteoarthritis, unspecified site: Secondary | ICD-10-CM | POA: Diagnosis not present

## 2020-10-31 DIAGNOSIS — E7800 Pure hypercholesterolemia, unspecified: Secondary | ICD-10-CM | POA: Diagnosis not present

## 2020-10-31 DIAGNOSIS — Z79899 Other long term (current) drug therapy: Secondary | ICD-10-CM | POA: Diagnosis not present

## 2020-10-31 DIAGNOSIS — Z0000 Encounter for general adult medical examination without abnormal findings: Secondary | ICD-10-CM | POA: Diagnosis not present

## 2020-10-31 DIAGNOSIS — K5100 Ulcerative (chronic) pancolitis without complications: Secondary | ICD-10-CM | POA: Diagnosis not present

## 2020-10-31 DIAGNOSIS — I10 Essential (primary) hypertension: Secondary | ICD-10-CM | POA: Diagnosis not present

## 2020-10-31 DIAGNOSIS — I2510 Atherosclerotic heart disease of native coronary artery without angina pectoris: Secondary | ICD-10-CM | POA: Diagnosis not present

## 2020-11-24 ENCOUNTER — Other Ambulatory Visit: Payer: Self-pay | Admitting: Cardiovascular Disease

## 2020-12-08 ENCOUNTER — Other Ambulatory Visit: Payer: Self-pay | Admitting: Cardiovascular Disease

## 2020-12-12 ENCOUNTER — Other Ambulatory Visit: Payer: Self-pay | Admitting: Cardiovascular Disease

## 2020-12-27 NOTE — Progress Notes (Incomplete)
Cardiology Office Note   Date:  12/27/2020   ID:  Barry [name], Barry [name] 1946-07-13, MRN [id number]  PCP:  [name], MD  Cardiologist:   Jenkins Rouge, MD   No chief complaint on file.     History of Present Illness: Barry [name] is a 75 y.o. male who was first seen in March 2020  regarding CAD Referred by Dr Lysle Rubens. 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   No angina some fatigue and dyspnea  Echo 11/06/19 done for fatigue showed EF 50-55% with no pulmonary HTN and AV sclerosis  Has had 3 COVID vaccines most recently September 2021   ***   Past Medical History:  Diagnosis Date  . A-fib ([place])   . Acute sinusitis 03/24/2012   Followed in Pulmonary clinic/ [facility]/ [name] - CT sinus 08/07/2013  Short air-fluid levels in the maxillary sinuses bilaterally suggesting early acute sinusitis. - repeat ct sinus 04/30/2014 > ok    . Acute sinusitis, unspecified 08/14/2010   Qualifier: Diagnosis of  By: [name], [name]    . Asthma    PFTs 06/15/05 FEV1 85% predicted ratio 68% and truncation of resp loop in a sawtooth pattern. HFA 25% 08-01-2009 >50% Nov 01, 2009>50% 01-31-2010  . Asthma, chronic 11/24/2007   Followed in Pulmonary clinic/ [facility]/ [name]  - PFTs 06/15/2005 FEV1 85% predicted ratio 68% and truncation of respiratory loop in a sawtooth pattern  - HFA 25% August 01, 2009 > 50% November 01, 2009 > 50% January 31, 2010 therefore changed to neb bud/brovana - HFA 75% p extensive coaching 08/07/2013  > insurance issues so try symbicort 160 2 bid instead of neb - 11/16/2015   p extensive  . BRONCHITIS, ACUTE 01/22/2011   Qualifier: Diagnosis of  By: Royal Piedra  NP, [name]    . CAD (coronary artery disease), native coronary artery 02/06/2019   S/p CABG 6301 complicated by paralyzed left hemidiaphram  . Cataract   . Chronic cough   . Chronic rhinitis 05/02/2009   Followed in Pulmonary clinic/ [facility]/ [name]    - 03/24/12 CT Sinus > Negative paranasal sinuses - Sinus CT 08/07/2013 >>Short air-fluid levels in the maxillary sinuses bilaterally suggesting early acute sinusitis - Repeat augmentin x 21 days 03/27/2014 then sinus ct if not better  - flonase/afrin regimen 02/11/15 >>>     . Coronary heart disease   . COUGH, CHRONIC 11/24/2007   Followed in Pulmonary clinic/ [facility]/ [name]     . DDD (degenerative disc disease), lumbar   . 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 Excessive ear wax, bilateral 09/15/2018  . GASTROESOPHAGEAL REFLUX DISEASE 08/27/2008   Followed as Primary Care Patient/ GI/ Dr  Earle Gell    . Glaucoma   . Hyperlipidemia LDL goal <70 02/06/2019  . Hypertension   . Obesity   . Osteoarthritis   . 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 Retina disorder 2014  . Severe obesity (BMI >= 40) ([place]) 11/24/2007       - Target  wt < 191 to get under BMI 30   . Ulcerative colitis   . ULCERATIVE COLITIS 11/24/2007   Followed as Primary Care Patient/ GI/ Dr  Earle Gell    . URI 04/09/2008   Qualifier: Diagnosis of  By: [name], [name]      Past Surgical History:  Procedure Laterality Date  . CARDIAC CATHETERIZATION Left   . COLONOSCOPY WITH PROPOFOL N/A 11/29/2015   Procedure: COLONOSCOPY WITH PROPOFOL;  Surgeon: [name], MD;  Location: [facility];  Service: Endoscopy;  Laterality: N/A;  . CORONARY ARTERY BYPASS GRAFT     x6     Current Outpatient Medications  Medication Sig Dispense Refill  . acetaminophen (TYLENOL) 500 MG tablet Take per bottle as needed for arthritis    . aspirin 81  MG tablet Take 81 mg by mouth daily.    [name] atorvastatin (LIPITOR) 40 MG tablet TAKE 1 TABLET BY MOUTH EVERY DAY 90 tablet 2  . budesonide-formoterol (SYMBICORT) 160-4.5 MCG/ACT inhaler TAKE 2 PUFFS BY MOUTH TWICE A DAY 30.6 g 11  . carboxymethylcellulose (REFRESH PLUS) 0.5 % SOLN Use as needed for dry eye    . Cholecalciferol (VITAMIN D) 50 MCG (2000 UT) tablet Take 2,000 Units by mouth daily.     . cycloSPORINE (RESTASIS) 0.05 % ophthalmic emulsion Place 1 drop into both eyes 2 (two) times daily.    [name] Dextromethorphan-Guaifenesin (MUCINEX DM MAXIMUM STRENGTH) 60-1200 MG per 12 hr tablet Take 1 tablet by mouth every 12 (twelve) hours as needed (for cough or thick mucus).     [name] diltiazem (CARDIZEM CD) 240 MG 24 hr capsule TAKE 1 CAPSULE BY MOUTH EVERY DAY 90 capsule 0  . docusate sodium (COLACE) 100 MG capsule Take 100 mg by mouth daily as needed for mild constipation.    . Eyelid Cleansers (OCUSOFT EYELID CLEANSING) PADS Apply topically. Use daily    . ezetimibe (ZETIA) 10 MG tablet Take 1 tablet (10 mg total) by mouth daily. 90 tablet 3  . fexofenadine (ALLEGRA) 180 MG tablet Take 180 mg by mouth daily.    . fluticasone (FLONASE) 50 MCG/ACT nasal spray Place 2 sprays into both nostrils 2 (two) times daily.     [name] ketoconazole (NIZORAL) 2 % shampoo Apply 1 application topically 2 (two) times a week. 120 mL 0  . latanoprost (XALATAN) 0.005 % ophthalmic solution Place 1 drop into both eyes at bedtime.    . metoprolol tartrate (LOPRESSOR) 25 MG tablet TAKE 1 TABLET BY MOUTH TWICE A DAY 180 tablet 0  . montelukast (SINGULAIR) 10 MG tablet One at bedtime every night 30 tablet 11  . Multiple Vitamins-Minerals (CENTRUM SILVER PO) Take 1 tablet by mouth daily.    [name] omeprazole (PRILOSEC) 20 MG capsule Take 20 mg by mouth 2 (two) times daily before a meal.     . oxymetazoline (AFRIN) 0.05 % nasal spray Place 1 spray into both nostrils 2 (two) times daily as needed for congestion (for 5 days, then stop).      Vladimir Faster Glyc-Propyl Glyc PF (SYSTANE ULTRA PF) 0.4-0.3 % SOLN Apply 1 drop to eye as needed.    . Pseudoephedrine-Ibuprofen (ADVIL COLD/SINUS) 30-200 MG TABS Per bottle as needed for sinus pressure/pain    . sodium chloride (OCEAN) 0.65 % SOLN nasal spray Use as needed for nasal congestion    . Spacer/Aero-Holding Chambers (AEROCHAMBER MV) inhaler Use as instructed 1 each 0  . sulfaSALAzine (AZULFIDINE) 500 MG tablet Take 1,000 mg by mouth  4 (four) times daily.     . VENTOLIN HFA 108 (90 Base) MCG/ACT inhaler INHALE 2 PUFFS INTO THE LUNGS EVERY 4 HRS AS NEEDED FOR WHEEZING OR SHORTNESS OF BREATH 18 Inhaler 5   No current facility-administered medications for this visit.    Allergies:   Piroxicam    Social History:  The patient  reports that he has never smoked. He has never used smokeless tobacco. He reports that he does not drink alcohol and does not use drugs.   Family History:  The patient's family history includes Heart disease in his father; Hypertension in his mother; Other in his sister; Pulmonary Hypertension in his mother; Renal Disease in his mother.    ROS:  Please see the history of present illness.   Otherwise, review of systems are positive for none.   All other systems are reviewed and negative.    PHYSICAL EXAM: VS:  There were no vitals taken for this visit. , BMI There is no height or weight on file to calculate BMI. Affect appropriate Overweight white male Chronically ill  HEENT: normal Neck supple with no adenopathy JVP normal no bruits no thyromegaly Lungs clear with no wheezing and good diaphragmatic motion Heart:  S1/S2 no murmur, no rub, gallop or click PMI normal post sternotomy  Abdomen: benighn, BS positve, no tenderness, no AAA no bruit.  No HSM or HJR Distal pulses intact with no bruits No edema Neuro non-focal Skin warm and dry LE orthopedic alignment issues knees down  with inner knee calluses     EKG:  NSR rate 90 normal ECG 02/19/19     Recent Labs: No results found for requested labs within last 8760 hours.    Lipid Panel No results found for: CHOL, TRIG, HDL, CHOLHDL, VLDL, LDLCALC, LDLDIRECT    Wt Readings from Last 3 Encounters:  08/31/20 102.1 kg  02/29/20 103.9 kg  11/26/19 106.1 kg      Other studies Reviewed: Additional studies/ records that were reviewed today include: Notes from Dr Wynonia Lawman cardiology ECG labs CABG report 2001 notes from pulmonary .    ASSESSMENT AND PLAN:  1. CAD/CABG:  2001  No angina continue medical Rx  2. HTN:  Well controlled.  Continue current medications and low sodium Dash type diet.   3. HLD:  Continue statin labs with primary  4. Pulmonary:  No active wheezing f/u [name] Parret Banks continue Symbicort, Allegra and Flonase  5. UC:  Continue steroids and sulfa f/u Dr Wynetta Emery GI 6. Fatigue:  EF 50-55% by echo 11/06/19 non cardiac f/u primary      Current medicines are reviewed at length with the patient today.  The patient does not have concerns regarding medicines.  The following changes have been made:  no change  Labs/ tests ordered today include None  No orders of the defined types were placed in this encounter.    Disposition:   FU with cardiology in a year      Signed, Jenkins Rouge, MD  12/27/2020 7:05 PM    [address], [place], [place]  [address] Phone: [phone]; Fax: [phone]

## 2020-12-30 ENCOUNTER — Ambulatory Visit: Payer: Medicare Other | Admitting: Cardiovascular Disease

## 2021-01-02 ENCOUNTER — Ambulatory Visit: Payer: Medicare Other | Admitting: Cardiovascular Disease

## 2021-01-03 NOTE — Progress Notes (Signed)
Cardiology Office Note   Date:  01/12/2021   ID:  Bentura, Mcmicken 30-Jun-1946, MRN [id number]  PCP:  [name], MD  Cardiologist:   Jenkins Rouge, MD   No chief complaint on file.     History of Present Illness: Barry [name] is a 75 y.o. male who was first seen in March 2020  regarding CAD Referred by Dr Lysle Rubens. 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   No angina some fatigue and dyspnea  Echo 11/06/19 done for fatigue showed EF 50-55% with no pulmonary HTN and AV sclerosis  Has had 3 COVID vaccines most recently September 2021   No angina Needs nitro    Past Medical History:  Diagnosis Date  . A-fib ([place])   . Acute sinusitis 03/24/2012   Followed in Pulmonary clinic/ [facility]/ [name] - CT sinus 08/07/2013  Short air-fluid levels in the maxillary sinuses bilaterally suggesting early acute sinusitis. - repeat ct sinus 04/30/2014 > ok    . Acute sinusitis, unspecified 08/14/2010   Qualifier: Diagnosis of  By: [name], [name]    . Asthma    PFTs 06/15/05 FEV1 85% predicted ratio 68% and truncation of resp loop in a sawtooth pattern. HFA 25% 08-01-2009 >50% Nov 01, 2009>50% 01-31-2010  . Asthma, chronic 11/24/2007   Followed in Pulmonary clinic/ [facility]/ [name]  - PFTs 06/15/2005 FEV1 85% predicted ratio 68% and truncation of respiratory loop in a sawtooth pattern  - HFA 25% August 01, 2009 > 50% November 01, 2009 > 50% January 31, 2010 therefore changed to neb bud/brovana - HFA 75% p extensive coaching 08/07/2013  > insurance issues so try symbicort 160 2 bid instead of neb - 11/16/2015   p extensive  . BRONCHITIS, ACUTE 01/22/2011   Qualifier:  Diagnosis of  By: [name], [name]    . CAD (coronary artery disease), native coronary artery 02/06/2019   S/p CABG 99991111 complicated by paralyzed left hemidiaphram  . Cataract   . Chronic cough   . Chronic rhinitis 05/02/2009   Followed in Pulmonary clinic/ [facility]/ [name]    - 03/24/12 CT Sinus > Negative paranasal sinuses - Sinus CT 08/07/2013 >>Short air-fluid levels in the maxillary sinuses bilaterally suggesting early acute sinusitis - Repeat augmentin x 21 days 03/27/2014 then sinus ct if not better  - flonase/afrin regimen 02/11/15 >>>     . Coronary heart disease   . COUGH, CHRONIC 11/24/2007   Followed in Pulmonary clinic/ [facility]/ [name]     . DDD (degenerative disc disease), lumbar   . 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 Excessive ear wax, bilateral 09/15/2018  . GASTROESOPHAGEAL REFLUX DISEASE 08/27/2008   Followed as Primary Care Patient/ GI/ Dr  Earle Gell    . Glaucoma   . Hyperlipidemia LDL goal <70 02/06/2019  . Hypertension   . Obesity   . Osteoarthritis   . 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 Retina disorder 2014  . Severe obesity (BMI >= 40) ([place]) 11/24/2007       -  Target wt < 191 to get under BMI 30   . Ulcerative colitis   . ULCERATIVE COLITIS 11/24/2007   Followed as Primary Care Patient/ GI/ Dr  Earle Gell    . URI 04/09/2008   Qualifier: Diagnosis of  By: [name], [name]      Past Surgical History:  Procedure Laterality Date  . CARDIAC CATHETERIZATION Left   . COLONOSCOPY WITH PROPOFOL N/A 11/29/2015   Procedure: COLONOSCOPY WITH PROPOFOL;  Surgeon: [name], MD;  Location: [facility];  Service: Endoscopy;  Laterality: N/A;  . CORONARY ARTERY BYPASS GRAFT     x6     Current Outpatient Medications  Medication Sig Dispense Refill  . acetaminophen (TYLENOL) 500 MG tablet Take per bottle as needed for  arthritis    . aspirin 81 MG tablet Take 81 mg by mouth daily.    [name] atorvastatin (LIPITOR) 40 MG tablet TAKE 1 TABLET BY MOUTH EVERY DAY 90 tablet 2  . budesonide-formoterol (SYMBICORT) 160-4.5 MCG/ACT inhaler TAKE 2 PUFFS BY MOUTH TWICE A DAY 30.6 g 11  . carboxymethylcellulose (REFRESH PLUS) 0.5 % SOLN Use as needed for dry eye    . Cholecalciferol (VITAMIN D) 50 MCG (2000 UT) tablet Take 2,000 Units by mouth daily.     . cycloSPORINE (RESTASIS) 0.05 % ophthalmic emulsion Place 1 drop into both eyes 2 (two) times daily.    [name] Dextromethorphan-Guaifenesin 60-1200 MG 12hr tablet Take 1 tablet by mouth every 12 (twelve) hours as needed (for cough or thick mucus).    [name] diltiazem (CARDIZEM CD) 240 MG 24 hr capsule TAKE 1 CAPSULE BY MOUTH EVERY DAY 90 capsule 0  . docusate sodium (COLACE) 100 MG capsule Take 100 mg by mouth daily as needed for mild constipation.    . Eyelid Cleansers (OCUSOFT EYELID CLEANSING) PADS Apply topically. Use daily    . ezetimibe (ZETIA) 10 MG tablet Take 1 tablet (10 mg total) by mouth daily. 90 tablet 3  . fexofenadine (ALLEGRA) 180 MG tablet Take 180 mg by mouth daily.    . fluticasone (FLONASE) 50 MCG/ACT nasal spray Place 2 sprays into both nostrils 2 (two) times daily.     [name] ketoconazole (NIZORAL) 2 % shampoo Apply 1 application topically 2 (two) times a week. 120 mL 0  . latanoprost (XALATAN) 0.005 % ophthalmic solution Place 1 drop into both eyes at bedtime.    . metoprolol tartrate (LOPRESSOR) 25 MG tablet TAKE 1 TABLET BY MOUTH TWICE A DAY 180 tablet 0  . montelukast (SINGULAIR) 10 MG tablet One at bedtime every night 30 tablet 11  . Multiple Vitamins-Minerals (CENTRUM SILVER PO) Take 1 tablet by mouth daily.    [name] omeprazole (PRILOSEC) 20 MG capsule Take 20 mg by mouth 2 (two) times daily before a meal.    . oxymetazoline (AFRIN) 0.05 % nasal spray Place 1 spray into both nostrils 2 (two) times daily as needed for congestion (for 5 days, then stop).     Vladimir Faster Glyc-Propyl Glyc PF 0.4-0.3 % SOLN Apply 1 drop to eye as needed.    . Pseudoephedrine-Ibuprofen 30-200 MG TABS Per bottle as needed for sinus pressure/pain    . sodium chloride (OCEAN) 0.65 % SOLN nasal spray Use as needed for nasal congestion    . Spacer/Aero-Holding Chambers (AEROCHAMBER MV) inhaler Use as instructed 1 each 0  . sulfaSALAzine (AZULFIDINE) 500 MG tablet Take 1,000 mg by mouth 4 (four) times daily.    . VENTOLIN HFA 108 (90  Base) MCG/ACT inhaler INHALE 2 PUFFS INTO THE LUNGS EVERY 4 HRS AS NEEDED FOR WHEEZING OR SHORTNESS OF BREATH 18 Inhaler 5   No current facility-administered medications for this visit.    Allergies:   Piroxicam    Social History:  The patient  reports that he has never smoked. He has never used smokeless tobacco. He reports that he does not drink alcohol and does not use drugs.   Family History:  The patient's family history includes Heart disease in his father; Hypertension in his mother; Other in his sister; Pulmonary Hypertension in his mother; Renal Disease in his mother.    ROS:  Please see the history of present illness.   Otherwise, review of systems are positive for none.   All other systems are reviewed and negative.    PHYSICAL EXAM: VS:  BP (!) 142/60   Pulse 90   Ht 5\' 3"  (1.6 m)   Wt 101.6 kg   SpO2 90%   BMI 39.68 kg/m  , BMI Body mass index is 39.68 kg/m. Affect appropriate Overweight white male Chronically ill  HEENT: normal Neck supple with no adenopathy JVP normal no bruits no thyromegaly Lungs clear with no wheezing and good diaphragmatic motion Heart:  S1/S2 no murmur, no rub, gallop or click PMI normal post sternotomy  Abdomen: benighn, BS positve, no tenderness, no AAA no bruit.  No HSM or HJR Distal pulses intact with no bruits No edema Neuro non-focal Skin warm and dry LE orthopedic alignment issues knees down  with inner knee calluses    EKG:  NSR rate 90 normal ECG 02/19/19 01/12/2021 SR rate 90  poor R wave progression    Recent Labs: No results found for requested labs within last 8760 hours.    Lipid Panel No results found for: CHOL, TRIG, HDL, CHOLHDL, VLDL, LDLCALC, LDLDIRECT    Wt Readings from Last 3 Encounters:  01/12/21 101.6 kg  08/31/20 102.1 kg  02/29/20 103.9 kg      Other studies Reviewed: Additional studies/ records that were reviewed today include: Notes from Dr Wynonia Lawman cardiology ECG labs CABG report 2001 notes from pulmonary .    ASSESSMENT AND PLAN:  1. CAD/CABG:  2001  No angina continue medical Rx  2. HTN:  Well controlled.  Continue current medications and low sodium Dash type diet.   3. HLD:  Continue statin labs with primary  4. Pulmonary:  No active wheezing f/u [name] Parret [place] continue Symbicort, Allegra and Flonase  5. UC:  Continue steroids and sulfa f/u Dr Wynetta Emery GI 6. Fatigue:  EF 50-55% by echo 11/06/19 non cardiac f/u primary      Current medicines are reviewed at length with the patient today.  The patient does not have concerns regarding medicines.  The following changes have been made:  no change  Labs/ tests ordered today include None  No orders of the defined types were placed in this encounter.    Disposition:   FU with cardiology in a year      Signed, Jenkins Rouge, MD  01/12/2021 10:56 AM    [address], [place], [place]  [address] Phone: [phone]; Fax: [phone]

## 2021-01-12 ENCOUNTER — Encounter: Payer: Self-pay | Admitting: Cardiovascular Disease

## 2021-01-12 ENCOUNTER — Other Ambulatory Visit: Payer: Self-pay

## 2021-01-12 ENCOUNTER — Ambulatory Visit (INDEPENDENT_AMBULATORY_CARE_PROVIDER_SITE_OTHER): Payer: Medicare Other | Admitting: Cardiovascular Disease

## 2021-01-12 VITALS — BP 142/60 | HR 90 | Ht 63.0 in | Wt 224.0 lb

## 2021-01-12 DIAGNOSIS — I10 Essential (primary) hypertension: Secondary | ICD-10-CM | POA: Diagnosis not present

## 2021-01-12 DIAGNOSIS — I25810 Atherosclerosis of coronary artery bypass graft(s) without angina pectoris: Secondary | ICD-10-CM

## 2021-01-12 MED ORDER — NITROGLYCERIN 0.4 MG SL SUBL
0.4000 mg | SUBLINGUAL_TABLET | SUBLINGUAL | 3 refills | Status: DC | PRN
Start: 2021-01-12 — End: 2023-08-18

## 2021-01-12 NOTE — Patient Instructions (Signed)

## 2021-02-28 ENCOUNTER — Other Ambulatory Visit: Payer: Self-pay

## 2021-02-28 ENCOUNTER — Encounter: Payer: Self-pay | Admitting: Internal Medicine

## 2021-02-28 ENCOUNTER — Ambulatory Visit (INDEPENDENT_AMBULATORY_CARE_PROVIDER_SITE_OTHER): Payer: Medicare Other | Admitting: Internal Medicine

## 2021-02-28 DIAGNOSIS — J4530 Mild persistent asthma, uncomplicated: Secondary | ICD-10-CM

## 2021-02-28 DIAGNOSIS — I25810 Atherosclerosis of coronary artery bypass graft(s) without angina pectoris: Secondary | ICD-10-CM | POA: Diagnosis not present

## 2021-02-28 NOTE — Patient Instructions (Signed)
No change in medications   Please schedule a follow up visit in 3 months but call sooner if needed with Rexene Edison NP

## 2021-02-28 NOTE — Progress Notes (Addendum)
Subjective:   Patient ID: Barry [name], male    DOB: [birth year] .   MRN: [id number]   Brief patient profile:  75 yowm never smoker with relatively mild asthma but superimposed on obesity plus a chronically paralyzed left hemidiaphragm and a tendency to upper airway cough syndrome.    History of Present Illness  03/24/12 [name] ov for sinusitis > rx levaquin > sinus CT > Negative paranasal sinuses.    11/16/2015  f/u ov/Brenetta Penny re: acute exac ab   Chief Complaint  Patient presents with  . Follow-up    Pt c/o increased chest congestion, cough with green mucus and some wheeze/SOB x5 days.    Baseline improves quit a bit between visits on symb 160 2bid maint rx /  not needing saba but started needing 3x daily and a hs x 5 days prior to OV  Ins setting of acute cough/ congestion  rec Augmentin 875 mg take one pill twice daily  X 10 days - Prednisone 10 mg take  4 each am x 2 days,   2 each am x 2 days,  1 each am x 2 days and stop  Remember to use afrin x 5 days before each dose of flonase Remember to use the flutter valve Work on inhaler technique:    Please schedule a follow up visit in 3 months but call sooner if needed to see [name] NP    02/14/2016 NP Follow up : Asthma and Chronic Rhinitis  Pt returns for 3 month follow up .  Complains over last 4 days , breathing is up & down. occ SOB. prod. cough brownish in color. occ. wheezing. occ. chest pain/tightness Had asthmatic bronchitis last ov, tx w/ augmentin and prednisone taper. He says he got over until last few days.  Denies chest pain , orthopnea, chest pain, edema or fever.  Remains on Symbicort and mucinex.  Takes chlortrimeton Three times a day  .  Noticed the trees are blooming having more drainage.  rec Omnicef 335m Twice daily  For 1 week  Prednisone taper over next week.  Eat yogurt while on antibiotics .  Mucinex DM Twice daily  As needed  Cough/congestion     06/13/2016  f/u ov/Dejia Ebron re:  AB/ symbicort 160  2bid/flonase - using med calendar well  Does really well p pred each time he gets it then gradually worse over several weeks esp in am cough cough / congestion/ slt yellow mucus / slt more sob but rare need for saba  Worse now x sev weeks again same exact symptoms with also nasal congestion but not able to use afrin or hfa effectively - see a/p rec Prednisone 10 mg take  4 each am x 2 days,   2 each am x 2 days,  1 each am x 2 days and stop  Work on inhaler technique:  Please schedule a follow up visit in 3 months but call sooner if needed to See [name] for new med calendar and bring your spacer for training every visit - ? Add singulair ?    09/12/2018  f/u ov/Siah Steely re: asthma  Chief Complaint  Patient presents with  . Follow-up    Breathing is overall doing well. He is using his albuterol inhaler 1 x daily on average. He has a prod cough with clear sputum.    Dyspnea:  Limited by knees > sob Cough: tends to be worse in am /clear mucus Sleeping: in lift chair 45 degrees SABA use: no  particular pattern 02: none  rec Singulair 10 mg daily take itEvery night If worse > Prednisone 10 mg take  4 each am x 2 days,   2 each am x 2 days,  1 each am x 2 days and stop  See [name] NP for ear lavage but use the otc kit first for about a week  -add needs allergy profile next ov     02/29/2020  f/u ov/Mamoru Takeshita re: already had both covid vaccines  Chief Complaint  Patient presents with  . Follow-up    Breathing is overall doing well. He uses his albuterol inhaler 1-2 x per wk.   Dyspnea:  Limited by knees more than sob on 2 wheeled walker / freq falls  Cough: no flares  Sleeping: lift chair / x45 degrees  SABA use: not much 02: none rec No change rx    02/28/2021  f/u ov/Keelan Tripodi re:  AB Chief Complaint  Patient presents with  . Follow-up    Breathing is overall doing well. He has noticed some sinus pressure/HA. He uses his albuterol inhaler once per wk on average.    Dyspnea:  Slowed down by  knees /ankles Cough: variable/ slt off white, better p advil cold and sinus  Sleeping: lift chair x 45 degrees  SABA use: once a weks  02: none Covid status: vax x 3    No obvious day to day or daytime variability or assoc excess/ purulent sputum or mucus plugs or hemoptysis or cp or chest tightness, subjective wheeze or overt    hb symptoms.   Sleeping as above without nocturnal  or early am exacerbation  of respiratory  c/o's or need for noct saba. Also denies any obvious fluctuation of symptoms with weather or environmental changes or other aggravating or alleviating factors except as outlined above   No unusual exposure hx or h/o childhood pna/ asthma or knowledge of premature birth.  Current Allergies, Complete Past Medical History, Past Surgical History, Family History, and Social History were reviewed in [employer] record.  ROS  The following are not active complaints unless bolded Hoarseness, sore throat, dysphagia, dental problems, itching, sneezing,  nasal congestion or discharge of excess mucus or purulent secretions, ear ache,   fever, chills, sweats, unintended wt loss or wt gain, classically pleuritic or exertional cp,  orthopnea pnd or arm/hand swelling  or leg swelling, presyncope, palpitations, abdominal pain, anorexia, nausea, vomiting, diarrhea  or change in bowel habits or change in bladder habits, change in stools or change in urine, dysuria, hematuria,  rash, arthralgias, visual complaints, headache, numbness, weakness or ataxia or problems with walking or coordination,  change in mood or  memory.        Current Meds  Medication Sig  . acetaminophen (TYLENOL) 500 MG tablet Take per bottle as needed for arthritis  . aspirin 81 MG tablet Take 81 mg by mouth daily.  [name] atorvastatin (LIPITOR) 40 MG tablet TAKE 1 TABLET BY MOUTH EVERY DAY  . betamethasone dipropionate 0.05 % lotion As directed  . budesonide-formoterol (SYMBICORT) 160-4.5 MCG/ACT  inhaler TAKE 2 PUFFS BY MOUTH TWICE A DAY  . carboxymethylcellulose (REFRESH PLUS) 0.5 % SOLN Use as needed for dry eye  . Cholecalciferol (VITAMIN D) 50 MCG (2000 UT) tablet Take 2,000 Units by mouth daily.   . cycloSPORINE (RESTASIS) 0.05 % ophthalmic emulsion Place 1 drop into both eyes 2 (two) times daily.  [name] Dextromethorphan-Guaifenesin 60-1200 MG 12hr tablet Take 1 tablet by mouth every  12 (twelve) hours as needed (for cough or thick mucus).  [name] diltiazem (CARDIZEM CD) 240 MG 24 hr capsule TAKE 1 CAPSULE BY MOUTH EVERY DAY  . docusate sodium (COLACE) 100 MG capsule Take 100 mg by mouth daily as needed for mild constipation.  . Eyelid Cleansers (OCUSOFT EYELID CLEANSING) PADS Apply topically. Use daily  . ezetimibe (ZETIA) 10 MG tablet Take 1 tablet (10 mg total) by mouth daily.  . fexofenadine (ALLEGRA) 180 MG tablet Take 180 mg by mouth daily.  . fluticasone (FLONASE) 50 MCG/ACT nasal spray Place 2 sprays into both nostrils 2 (two) times daily.   [name] latanoprost (XALATAN) 0.005 % ophthalmic solution Place 1 drop into both eyes at bedtime.  . metoprolol tartrate (LOPRESSOR) 25 MG tablet TAKE 1 TABLET BY MOUTH TWICE A DAY  . montelukast (SINGULAIR) 10 MG tablet One at bedtime every night  . Multiple Vitamins-Minerals (CENTRUM SILVER PO) Take 1 tablet by mouth daily.  . nitroGLYCERIN (NITROSTAT) 0.4 MG SL tablet Place 1 tablet (0.4 mg total) under the tongue every 5 (five) minutes as needed for chest pain.  [name] omeprazole (PRILOSEC) 20 MG capsule Take 20 mg by mouth 2 (two) times daily before a meal.  . oxymetazoline (AFRIN) 0.05 % nasal spray Place 1 spray into both nostrils 2 (two) times daily as needed for congestion (for 5 days, then stop).   Vladimir Faster Glyc-Propyl Glyc PF 0.4-0.3 % SOLN Apply 1 drop to eye as needed.  . Pseudoephedrine-Ibuprofen 30-200 MG TABS Per bottle as needed for sinus pressure/pain  . sodium chloride (OCEAN) 0.65 % SOLN nasal spray Use as needed for nasal congestion   . Spacer/Aero-Holding Chambers (AEROCHAMBER MV) inhaler Use as instructed  . sulfaSALAzine (AZULFIDINE) 500 MG tablet Take 1,000 mg by mouth 4 (four) times daily.  . 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  [place] - Pneumovax age 76 November 01, 2009 (second shot) [name] 12/2013, pneumovax 11/02/2019  -med calendar 09/11/2012 , 01/05/14 , 05/12/2015     Family History:  Coronary Heart Disease, father  Hypertension, mother   Social History:  Patient never smoked.  no alcohol  single  no children  disabled: textiles         Objective:         02/28/2021     219 02/29/2020     229  09/12/2018     231  Wt  233 06/06/11  >> 233 09/18/2013 > 11/05/2013 226 >231 01/06/2014 > 238 03/08/2014 > 03/26/2014 239 > 06/24/2014 238 >  02/11/15 233 >233 05/12/2015 >237 08/19/2015 > 11/16/2015  239 >234 02/14/2016 >  06/13/2016   234 >   Vital signs reviewed  02/28/2021  - Note at rest 02 sats  94% on RA  General appearance:   Elderly obese amb wm nad    HEENT : pt wearing mask not removed for exam due to covid -19 concerns.    NECK :  without JVD/Nodes/TM/ nl carotid upstrokes bilaterally   LUNGS: no acc muscle use, chest with severe kyphoscoliosis  But clear to A and P bilaterally without cough on insp or exp maneuvers   CV:  RRR  no s3  or murmur or increase in P2, and trace bilateral ankle edema sym  ABD:  soft and nontender with nl inspiratory excursion in the supine position. No bruits or organomegaly appreciated, bowel sounds nl  MS:  Awkward gait/ ext warm with valgus knee  deformities, calf tenderness, cyanosis or clubbing No obvious joint restrictions   SKIN: warm and dry without lesions    NEURO:  alert, approp, nl sensorium with  no motor or cerebellar deficits apparent.             Assessment & Plan:

## 2021-03-01 ENCOUNTER — Encounter: Payer: Self-pay | Admitting: Internal Medicine

## 2021-03-01 NOTE — Assessment & Plan Note (Signed)
Never smoker - PFTs 06/15/2005 FEV1 85% predicted ratio 68% and truncation of respiratory loop in a sawtooth pattern  - HFA 25% August 01, 2009 > 50% November 01, 2009 > 50% January 31, 2010 therefore changed to neb bud/brovana - HFA 75% p extensive coaching 08/07/2013  > insurance issues so try symbicort 160 2 bid instead of neb - 11/16/2015   p extensive coaching HFA effectiveness =    75% with  spacer  - 06/13/2016  After extensive coaching HFA effectiveness =    50% s spacer  - added singulair 09/12/2018  - allergy profile 09/19/18 IgE 5 , neg RAST , eos 0.1  All goals of chronic asthma control met including optimal function and elimination of symptoms with minimal need for rescue therapy.  Contingencies discussed in full including contacting this office immediately if not controlling the symptoms using the rule of two's.           Each maintenance medication was reviewed in detail including emphasizing most importantly the difference between maintenance and prns and under what circumstances the prns are to be triggered using an action plan format where appropriate.  Total time for H and P, chart review, counseling, reviewing hfa device(s) and generating customized AVS unique to this office visit / same day charting =  20 min

## 2021-03-13 ENCOUNTER — Other Ambulatory Visit: Payer: Self-pay | Admitting: Cardiovascular Disease

## 2021-03-14 MED ORDER — DILTIAZEM HCL ER COATED BEADS 240 MG PO CP24: ORAL_CAPSULE | ORAL | 2 refills | Status: DC

## 2021-03-18 ENCOUNTER — Other Ambulatory Visit: Payer: Self-pay | Admitting: Cardiovascular Disease

## 2021-04-21 DIAGNOSIS — H402232 Chronic angle-closure glaucoma, bilateral, moderate stage: Secondary | ICD-10-CM | POA: Diagnosis not present

## 2021-05-09 ENCOUNTER — Encounter: Payer: Self-pay | Admitting: Internal Medicine

## 2021-05-11 ENCOUNTER — Other Ambulatory Visit: Payer: Self-pay

## 2021-05-11 ENCOUNTER — Emergency Department (HOSPITAL_BASED_OUTPATIENT_CLINIC_OR_DEPARTMENT_OTHER)
Admission: EM | Admit: 2021-05-11 | Discharge: 2021-05-11 | Disposition: A | Discharge status: Home / Self Care | Payer: Medicare Other | Attending: Emergency Medicine | Admitting: Emergency Medicine

## 2021-05-11 ENCOUNTER — Encounter (HOSPITAL_BASED_OUTPATIENT_CLINIC_OR_DEPARTMENT_OTHER): Payer: Self-pay | Admitting: *Deleted

## 2021-05-11 DIAGNOSIS — Z951 Presence of aortocoronary bypass graft: Secondary | ICD-10-CM | POA: Diagnosis not present

## 2021-05-11 DIAGNOSIS — H538 Other visual disturbances: Secondary | ICD-10-CM | POA: Insufficient documentation

## 2021-05-11 DIAGNOSIS — H402222 Chronic angle-closure glaucoma, left eye, moderate stage: Secondary | ICD-10-CM | POA: Diagnosis not present

## 2021-05-11 DIAGNOSIS — Z7951 Long term (current) use of inhaled steroids: Secondary | ICD-10-CM | POA: Insufficient documentation

## 2021-05-11 DIAGNOSIS — I2510 Atherosclerotic heart disease of native coronary artery without angina pectoris: Secondary | ICD-10-CM | POA: Diagnosis not present

## 2021-05-11 DIAGNOSIS — R0902 Hypoxemia: Secondary | ICD-10-CM | POA: Diagnosis not present

## 2021-05-11 DIAGNOSIS — G4489 Other headache syndrome: Secondary | ICD-10-CM | POA: Diagnosis not present

## 2021-05-11 DIAGNOSIS — Z79899 Other long term (current) drug therapy: Secondary | ICD-10-CM | POA: Insufficient documentation

## 2021-05-11 DIAGNOSIS — J45909 Unspecified asthma, uncomplicated: Secondary | ICD-10-CM | POA: Diagnosis not present

## 2021-05-11 DIAGNOSIS — I10 Essential (primary) hypertension: Secondary | ICD-10-CM | POA: Insufficient documentation

## 2021-05-11 DIAGNOSIS — Z7982 Long term (current) use of aspirin: Secondary | ICD-10-CM | POA: Insufficient documentation

## 2021-05-11 NOTE — Discharge Instructions (Addendum)
Please have your friend drive you directly to Dr. Claudius Sis office for another exam of your eye.  [facility] [address], [place], [place] [address]  Phone: [phone]

## 2021-05-11 NOTE — ED Triage Notes (Signed)
Had Lasik surgery today and went home driving.  While he was driving, his vision become blurry and is worst on the left eye.

## 2021-05-11 NOTE — ED Provider Notes (Signed)
[place] [facility] EMERGENCY DEPT Provider Note   CSN: [id number] Arrival date & time: 05/11/21  0940     History Chief Complaint  Patient presents with  . Blurred Vision    Barry [name] is a 75 y.o. male presenting to the Ed with blurred vision in his left eye.  He reports he underwent Laser eye surgery for glaucoma with [facility] this morning at 0800. The surgery only involved his left eye.  He felt "okay" with his vision following the surgery, with only mild blurriness, but when driving home afterwards.  HPI     Past Medical History:  Diagnosis Date  . A-fib ([place])   . Acute sinusitis 03/24/2012   Followed in Pulmonary clinic/ [facility]/ [name] - CT sinus 08/07/2013  Short air-fluid levels in the maxillary sinuses bilaterally suggesting early acute sinusitis. - repeat ct sinus 04/30/2014 > ok    . Acute sinusitis, unspecified 08/14/2010   Qualifier: Diagnosis of  By: [name], [name]    . Asthma    PFTs 06/15/05 FEV1 85% predicted ratio 68% and truncation of resp loop in a sawtooth pattern. HFA 25% 08-01-2009 >50% Nov 01, 2009>50% 01-31-2010  . Asthma, chronic 11/24/2007   Followed in Pulmonary clinic/ [facility]/ [name]  - PFTs 06/15/2005 FEV1 85% predicted ratio 68% and truncation of respiratory loop in a sawtooth pattern  - HFA 25% August 01, 2009 > 50% November 01, 2009 > 50% January 31, 2010 therefore changed to neb bud/brovana - HFA 75% p extensive coaching 08/07/2013  > insurance issues so try symbicort 160 2 bid instead of neb - 11/16/2015   p extensive  . BRONCHITIS, ACUTE 01/22/2011   Qualifier: Diagnosis of  By: [name], [name]    . CAD (coronary artery disease), native coronary artery 02/06/2019   S/p CABG 3151 complicated by paralyzed left hemidiaphram  . Cataract   . Chronic cough   . Chronic rhinitis 05/02/2009   Followed in Pulmonary clinic/ [facility]/ [name]    - 03/24/12 CT Sinus > Negative paranasal sinuses - Sinus CT  08/07/2013 >>Short air-fluid levels in the maxillary sinuses bilaterally suggesting early acute sinusitis - Repeat augmentin x 21 days 03/27/2014 then sinus ct if not better  - flonase/afrin regimen 02/11/15 >>>     . Coronary heart disease   . COUGH, CHRONIC 11/24/2007   Followed in Pulmonary clinic/ [facility]/ [name]     . DDD (degenerative disc disease), lumbar   . 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 Excessive ear wax, bilateral 09/15/2018  . GASTROESOPHAGEAL REFLUX DISEASE 08/27/2008   Followed as Primary Care Patient/ GI/ Dr  Earle Gell    . Glaucoma   . Hyperlipidemia LDL goal <70 02/06/2019  . Hypertension   . Obesity   . Osteoarthritis   . 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 Retina disorder 2014  . Severe obesity (BMI >= 40) (HCC) 11/24/2007       - Target wt < 191 to get under BMI 30   . Ulcerative colitis   . ULCERATIVE COLITIS 11/24/2007   Followed as Primary Care Patient/ GI/ Dr  Earle Gell    . URI 04/09/2008   Qualifier: Diagnosis of  By: [name], [name]      Patient Active Problem List   Diagnosis Date Noted  . Seborrheic dermatitis 09/02/2020  . CAD (  coronary artery disease), native coronary artery 02/06/2019  . Benign essential HTN 02/06/2019  . Hyperlipidemia LDL goal <70 02/06/2019  . Excessive ear wax, bilateral 09/15/2018  . Acute sinusitis 03/24/2012  . 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  . Chronic rhinitis 05/02/2009  . GASTROESOPHAGEAL REFLUX DISEASE 08/27/2008  . Severe obesity (BMI >= 40) ([place]) 11/24/2007  . Asthma, chronic 11/24/2007  . ULCERATIVE COLITIS 11/24/2007    Past Surgical History:  Procedure Laterality Date  . CARDIAC CATHETERIZATION Left   . COLONOSCOPY WITH PROPOFOL N/A 11/29/2015   Procedure: COLONOSCOPY WITH PROPOFOL;  Surgeon: [name], MD;  Location: [facility];  Service: Endoscopy;   Laterality: N/A;  . CORONARY ARTERY BYPASS GRAFT     x6       Family History  Problem Relation Age of Onset  . Heart disease Father   . Hypertension Mother   . Renal Disease Mother   . Pulmonary Hypertension Mother   . Other Sister        UNKNOWN HISTORY    Social History   Tobacco Use  . Smoking status: Never Smoker  . Smokeless tobacco: Never Used  Substance Use Topics  . Alcohol use: No  . Drug use: No    Home Medications Prior to Admission medications   Medication Sig Start Date End Date Taking? Authorizing Provider  acetaminophen (TYLENOL) 500 MG tablet Take per bottle as needed for arthritis   Yes [provider]  aspirin 81 MG tablet Take 81 mg by mouth daily.   Yes [provider]  atorvastatin (LIPITOR) 40 MG tablet TAKE 1 TABLET BY MOUTH EVERY DAY 11/24/20  Yes Josue Hector, MD  betamethasone dipropionate 0.05 % lotion As directed 01/30/21  Yes [provider]  budesonide-formoterol (SYMBICORT) 160-4.5 MCG/ACT inhaler TAKE 2 PUFFS BY MOUTH TWICE A DAY 08/31/20  Yes Parrett, [name] S, NP  carboxymethylcellulose (REFRESH PLUS) 0.5 % SOLN Use as needed for dry eye   Yes [provider]  Cholecalciferol (VITAMIN D) 50 MCG (2000 UT) tablet Take 2,000 Units by mouth daily.    Yes [provider]  cycloSPORINE (RESTASIS) 0.05 % ophthalmic emulsion Place 1 drop into both eyes 2 (two) times daily.   Yes [provider]  diltiazem (CARDIZEM CD) 240 MG 24 hr capsule TAKE 1 CAPSULE BY MOUTH EVERY DAY 03/14/21  Yes Josue Hector, MD  docusate sodium (COLACE) 100 MG capsule Take 100 mg by mouth daily as needed for mild constipation.   Yes [provider]  ezetimibe (ZETIA) 10 MG tablet TAKE 1 TABLET BY MOUTH EVERY DAY 03/20/21  Yes Josue Hector, MD  fexofenadine (ALLEGRA) 180 MG tablet Take 180 mg by mouth daily.   Yes [provider]  fluticasone (FLONASE) 50 MCG/ACT nasal spray Place 2 sprays into both  nostrils 2 (two) times daily.    Yes [provider]  latanoprost (XALATAN) 0.005 % ophthalmic solution Place 1 drop into both eyes at bedtime.   Yes [provider]  metoprolol tartrate (LOPRESSOR) 25 MG tablet TAKE 1 TABLET BY MOUTH TWICE A DAY 03/14/21  Yes Josue Hector, MD  montelukast (SINGULAIR) 10 MG tablet One at bedtime every night 02/29/20  Yes Tanda Rockers, MD  Multiple Vitamins-Minerals (CENTRUM SILVER PO) Take 1 tablet by mouth daily.   Yes [provider]  omeprazole (PRILOSEC) 20 MG capsule Take 20 mg by mouth 2 (two) times daily before a meal.   Yes [provider]  Eyelid Cleansers (  OCUSOFT EYELID CLEANSING) PADS Apply topically. Use daily    [provider]  nitroGLYCERIN (NITROSTAT) 0.4 MG SL tablet Place 1 tablet (0.4 mg total) under the tongue every 5 (five) minutes as needed for chest pain. 01/12/21   Josue Hector, MD  Polyethyl Glyc-Propyl Glyc PF 0.4-0.3 % SOLN Apply 1 drop to eye as needed.    [provider]  Pseudoephedrine-Ibuprofen 30-200 MG TABS Per bottle as needed for sinus pressure/pain    [provider]  Spacer/Aero-Holding Chambers (AEROCHAMBER MV) inhaler Use as instructed 09/18/13   Parrett, Fonnie Mu, NP  sulfaSALAzine (AZULFIDINE) 500 MG tablet Take 1,000 mg by mouth 4 (four) times daily.    [provider]  VENTOLIN HFA 108 (90 Base) MCG/ACT inhaler INHALE 2 PUFFS INTO THE LUNGS EVERY 4 HRS AS NEEDED FOR WHEEZING OR SHORTNESS OF BREATH 03/19/19   Parrett, Fonnie Mu, NP    Allergies    Piroxicam  Review of Systems   Review of Systems  Constitutional: Negative for chills and fever.  Eyes: Positive for visual disturbance. Negative for photophobia, pain, discharge, redness and itching.  Neurological: Negative for syncope, speech difficulty, weakness, light-headedness, numbness and headaches.  All other systems reviewed and are negative.   Physical Exam Updated Vital Signs BP 125/72  (BP Location: Right Arm)   Pulse 89   Temp 98.5 F (36.9 C) (Oral)   Resp 18   Ht 5\' 3"  (1.6 m)   Wt 99.3 kg   SpO2 93%   BMI 38.79 kg/m   Physical Exam Constitutional:      General: He is not in acute distress. HENT:     Head: Normocephalic and atraumatic.  Eyes:     General: Lids are normal. No visual field deficit.       Right eye: No foreign body or discharge.        Left eye: No foreign body or discharge.     Extraocular Movements: Extraocular movements intact.     Right eye: Normal extraocular motion and no nystagmus.     Left eye: Normal extraocular motion and no nystagmus.     Conjunctiva/sclera: Conjunctivae normal.     Right eye: Right conjunctiva is not injected. No chemosis, exudate or hemorrhage.    Left eye: Left conjunctiva is not injected. No chemosis, exudate or hemorrhage.    Pupils: Pupils are equal, round, and reactive to light.     Comments: Vision as noted in Ed comments below  Cardiovascular:     Rate and Rhythm: Normal rate and regular rhythm.  Pulmonary:     Effort: Pulmonary effort is normal. No respiratory distress.  Abdominal:     General: There is no distension.  Skin:    General: Skin is warm and dry.  Neurological:     General: No focal deficit present.     Mental Status: He is alert and oriented to person, place, and time. Mental status is at baseline.     Sensory: No sensory deficit.     Motor: No weakness.     ED Results / Procedures / Treatments   Labs (all labs ordered are listed, but only abnormal results are displayed) Labs Reviewed - No data to display  EKG None  Radiology No results found.  Procedures Procedures   Medications Ordered in ED Medications - No data to display  ED Course  I have reviewed the triage vital signs and the nursing notes.  Pertinent labs & imaging results that were available  during my care of the patient were reviewed by me and considered in my medical decision making (see chart for  details).  Painless blurred vision in left eye, transient, after a laser surgery this morning Vision poor but fairly equal in both eyes on exam  Doubt TRAO or vascular occlusion as he reports blurred vision, not vision loss Doubt acute angle glaucoma, iritis, or infection of the eye per clinical exam Doubt retinal detachment with this description, and improvement of his symptoms  I suspect this may be an expected after-effect of his very recent procedure.  I will discuss with his ophthalmologist Dr Herbert Deaner by phone  Patient's friend is now present at bedside.  Clinical Course as of 05/11/21 1112  Thu May 11, 2021  1007 Vision 20/50 in right eye and 20/70 in left eye (affected) [MT]  [place] [facility] [phone] - spoke to receptionist, awaiting call back from Dr Herbert Deaner. [MT]  1038 I spoke to Dr Herbert Deaner by phone and reviewed the case.  She advised the patient could be sent to her office for another evaluation now after discharge.  She agreed with me that this was unlikely a vascular occlusion or stroke given the depiction of blurred vision rather than vision loss.  The patient reports his vision bluriness has already improved since its onset.  He has a family friend now present at bedside who will be driving him to Dr [employer] office now.   [MT]    Clinical Course User Index [MT] Phill Steck, Carola Rhine, MD    Final Clinical Impression(s) / ED Diagnoses Final diagnoses:  Blurred vision, left eye    Rx / DC Orders ED Discharge Orders    None       Wyvonnia Dusky, MD 05/11/21 1112

## 2021-05-26 ENCOUNTER — Other Ambulatory Visit: Payer: Self-pay | Admitting: Internal Medicine

## 2021-06-01 ENCOUNTER — Ambulatory Visit: Payer: Medicare Other | Admitting: Adult Health

## 2021-06-01 DIAGNOSIS — H402232 Chronic angle-closure glaucoma, bilateral, moderate stage: Secondary | ICD-10-CM | POA: Diagnosis not present

## 2021-06-07 ENCOUNTER — Ambulatory Visit (INDEPENDENT_AMBULATORY_CARE_PROVIDER_SITE_OTHER): Payer: Medicare Other | Admitting: Adult Health

## 2021-06-07 ENCOUNTER — Encounter: Payer: Self-pay | Admitting: Adult Health

## 2021-06-07 ENCOUNTER — Other Ambulatory Visit: Payer: Self-pay

## 2021-06-07 ENCOUNTER — Ambulatory Visit (INDEPENDENT_AMBULATORY_CARE_PROVIDER_SITE_OTHER): Payer: Medicare Other

## 2021-06-07 VITALS — BP 144/78 | HR 90 | Temp 98.4°F | Resp 20 | Ht 63.0 in | Wt 216.0 lb

## 2021-06-07 DIAGNOSIS — J4530 Mild persistent asthma, uncomplicated: Secondary | ICD-10-CM

## 2021-06-07 DIAGNOSIS — J45909 Unspecified asthma, uncomplicated: Secondary | ICD-10-CM | POA: Diagnosis not present

## 2021-06-07 DIAGNOSIS — I25810 Atherosclerosis of coronary artery bypass graft(s) without angina pectoris: Secondary | ICD-10-CM | POA: Diagnosis not present

## 2021-06-07 DIAGNOSIS — J310 Chronic rhinitis: Secondary | ICD-10-CM | POA: Diagnosis not present

## 2021-06-07 NOTE — Progress Notes (Signed)
@Patient  ID: Barry [name], male    DOB: 05/08/1946, 75 y.o.   MRN: [id number]  Chief Complaint  Patient presents with   Follow-up    Referring provider: [name], MD  HPI: 75 year old male never smoker followed for mild asthma, chronically paralyzed left hemidiaphragm and chronic rhinitis and sinusitis Medical history significant for ulcerative colitis followed by gastroenterology on sulfasalazine, coronary disease with previous CABG, hypertension and hyperlipidemia  TEST/EVENTS :  PFTs 06/15/2005 FEV1 85% predicted ratio 68% and truncation of respiratory loop in a sawtooth pattern 03/24/12 CT Sinus > Negative paranasal sinuses - Sinus CT 08/07/2013 >>Short air-fluid levels in the maxillary sinuses bilaterally suggesting early acute sinusitis CT sinus 2015- for sinusitis PVX 2016 Prevnar 2015   06/07/2021 Follow up : Asthma and CR  Patient presents for a 29-month follow-up.  Patient says overall he is doing well.  He has mild asthma.  Says he has had no real flare of cough or wheezing.  Has noticed the high heat and humidity cause breathing not to be as well. Did use Albuterol inhaler last 2 days which helped   No emergency room or hospitalizations.  He remains on Symbicort twice daily.  Uses Allegra and Flonase most days. Activity level is at baseline. Limited by knee pain. Does driving and light chores. Lives alone. Has a friend Margaretha Sheffield) that helps him and has power of attorney.  Covid Vaccine x 3 utd.  Weight is down 10lbs over last year. Says appetite is lower for some reason.    Allergies  Allergen Reactions   Piroxicam Itching and Rash    Immunization History  Administered Date(s) Administered   Influenza Split 09/20/2009, 10/17/2012, 09/16/2014, 10/03/2015, 10/15/2015, 10/08/2017, 09/10/2018, 10/10/2020   Influenza Whole 08/17/2009, 09/11/2010, 09/17/2011   Influenza, High Dose Seasonal PF 09/16/2016, 09/25/2017, 09/10/2018, 09/02/2019   Influenza,inj,Quad PF,6+ Mos  09/18/2013   PFIZER(Purple Top)SARS-COV-2 Vaccination 01/02/2020, 01/21/2020, 09/06/2020   Pneumococcal Conjugate-13 01/05/2014   Pneumococcal Polysaccharide-23 11/16/2005, 10/01/2009, 11/01/2009, 11/02/2019   Tdap 02/24/2015   Zoster Recombinat (Shingrix) 09/02/2019   Zoster, Live 09/07/2019, 03/08/2020    Past Medical History:  Diagnosis Date   A-fib ([place])    Acute sinusitis 03/24/2012   Followed in Pulmonary clinic/ [facility]/ [name] - CT sinus 08/07/2013  Short air-fluid levels in the maxillary sinuses bilaterally suggesting early acute sinusitis. - repeat ct sinus 04/30/2014 > ok     Acute sinusitis, unspecified 08/14/2010   Qualifier: Diagnosis of  By: [name], Riley Hallum     Asthma    PFTs 06/15/05 FEV1 85% predicted ratio 68% and truncation of resp loop in a sawtooth pattern. HFA 25% 08-01-2009 >50% Nov 01, 2009>50% 01-31-2010   Asthma, chronic 11/24/2007   Followed in Pulmonary clinic/ [facility]/ [name]  - PFTs 06/15/2005 FEV1 85% predicted ratio 68% and truncation of respiratory loop in a sawtooth pattern  - HFA 25% August 01, 2009 > 50% November 01, 2009 > 50% January 31, 2010 therefore changed to neb bud/brovana - HFA 75% p extensive coaching 08/07/2013  > insurance issues so try symbicort 160 2 bid instead of neb - 11/16/2015   p extensive   BRONCHITIS, ACUTE 01/22/2011   Qualifier: Diagnosis of  By: [name], Lorissa Kishbaugh     CAD (coronary artery disease), native coronary artery 02/06/2019   S/p CABG 2440 complicated by paralyzed left hemidiaphram   Cataract    Chronic cough    Chronic rhinitis 05/02/2009   Followed in Pulmonary clinic/ [facility]/ [name]    -  03/24/12 CT Sinus > Negative paranasal sinuses - Sinus CT 08/07/2013 >>Short air-fluid levels in the maxillary sinuses bilaterally suggesting early acute sinusitis - Repeat augmentin x 21 days 03/27/2014 then sinus ct if not better  - flonase/afrin regimen 02/11/15 >>>      Coronary heart disease    COUGH, CHRONIC 11/24/2007    Followed in Pulmonary clinic/ [facility]/ [name]      DDD (degenerative disc disease), lumbar    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   Followed in Pulmonary clinic/ [facility]/ [name]  -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     Excessive ear wax, bilateral 09/15/2018   GASTROESOPHAGEAL REFLUX DISEASE 08/27/2008   Followed as Primary Care Patient/ GI/ Dr  Earle Gell     Glaucoma    Hyperlipidemia LDL goal <70 02/06/2019   Hypertension    Obesity    Osteoarthritis    Paralyzed hemidiaphragm    left, 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   Retina disorder 2014   Severe obesity (BMI >= 40) (HCC) 11/24/2007       - Target wt < 191 to get under BMI 30    Ulcerative colitis    ULCERATIVE COLITIS 11/24/2007   Followed as Primary Care Patient/ GI/ Dr  Earle Gell     URI 04/09/2008   Qualifier: Diagnosis of  By: [name], Isabelly Kobler      Tobacco History: Social History   Tobacco Use  Smoking Status Never  Smokeless Tobacco Never   Counseling given: Not Answered   Outpatient Medications Prior to Visit  Medication Sig Dispense Refill   acetaminophen (TYLENOL) 500 MG tablet Take per bottle as needed for arthritis     aspirin 81 MG tablet Take 81 mg by mouth daily.     atorvastatin (LIPITOR) 40 MG tablet TAKE 1 TABLET BY MOUTH EVERY DAY 90 tablet 2   betamethasone dipropionate 0.05 % lotion As directed     budesonide-formoterol (SYMBICORT) 160-4.5 MCG/ACT inhaler TAKE 2 PUFFS BY MOUTH TWICE A DAY 30.6 g 11   carboxymethylcellulose (REFRESH PLUS) 0.5 % SOLN Use as needed for dry eye     Cholecalciferol (VITAMIN D) 50 MCG (2000 UT) tablet Take 2,000 Units by mouth daily.      cycloSPORINE (RESTASIS) 0.05 % ophthalmic emulsion Place 1 drop into both eyes 2 (two) times daily.     diltiazem (CARDIZEM CD) 240 MG 24 hr capsule TAKE 1 CAPSULE BY MOUTH EVERY DAY 90 capsule 2   docusate sodium (COLACE) 100 MG capsule Take 100 mg by mouth daily as needed for mild constipation.     Eyelid  Cleansers (OCUSOFT EYELID CLEANSING) PADS Apply topically. Use daily     ezetimibe (ZETIA) 10 MG tablet TAKE 1 TABLET BY MOUTH EVERY DAY 90 tablet 3   fexofenadine (ALLEGRA) 180 MG tablet Take 180 mg by mouth daily.     fluticasone (FLONASE) 50 MCG/ACT nasal spray Place 2 sprays into both nostrils 2 (two) times daily.      latanoprost (XALATAN) 0.005 % ophthalmic solution Place 1 drop into both eyes at bedtime.     metoprolol tartrate (LOPRESSOR) 25 MG tablet TAKE 1 TABLET BY MOUTH TWICE A DAY 180 tablet 2   montelukast (SINGULAIR) 10 MG tablet ONE AT BEDTIME EVERY NIGHT 90 tablet 3   Multiple Vitamins-Minerals (CENTRUM SILVER PO) Take 1 tablet by mouth daily.     nitroGLYCERIN (NITROSTAT) 0.4 MG SL tablet Place 1 tablet (0.4 mg total) under the tongue every 5 (five) minutes as needed for  chest pain. 25 tablet 3   omeprazole (PRILOSEC) 20 MG capsule Take 20 mg by mouth 2 (two) times daily before a meal.     Polyethyl Glyc-Propyl Glyc PF 0.4-0.3 % SOLN Apply 1 drop to eye as needed.     Pseudoephedrine-Ibuprofen 30-200 MG TABS Per bottle as needed for sinus pressure/pain     Spacer/Aero-Holding Chambers (AEROCHAMBER MV) inhaler Use as instructed 1 each 0   sulfaSALAzine (AZULFIDINE) 500 MG tablet Take 1,000 mg by mouth 4 (four) times daily.     VENTOLIN HFA 108 (90 Base) MCG/ACT inhaler INHALE 2 PUFFS INTO THE LUNGS EVERY 4 HRS AS NEEDED FOR WHEEZING OR SHORTNESS OF BREATH 18 Inhaler 5   No facility-administered medications prior to visit.     Review of Systems:   Constitutional:   No  weight loss, night sweats,  Fevers, chills,  +fatigue, or  lassitude.  HEENT:   No headaches,  Difficulty swallowing,  Tooth/dental problems, or  Sore throat,                No sneezing, itching, ear ache,  +nasal congestion, post nasal drip,   CV:  No chest pain,  Orthopnea, PND, swelling in lower extremities, anasarca, dizziness, palpitations, syncope.   GI  No heartburn, indigestion, abdominal pain,  nausea, vomiting, diarrhea, change in bowel habits, loss of appetite, bloody stools.   Resp:  No excess mucus, no productive cough,  No non-productive cough,  No coughing up of blood.  No change in color of mucus.  No wheezing.  No chest wall deformity  Skin: no rash or lesions.  GU: no dysuria, change in color of urine, no urgency or frequency.  No flank pain, no hematuria   MS:  No joint pain or swelling.  No decreased range of motion. +knee pain .    Physical Exam  BP (!) 144/78 (BP Location: Left Arm, Patient Position: Sitting, Cuff Size: Normal)   Pulse 90   Temp 98.4 F (36.9 C) (Oral)   Resp 20   Ht 5\' 3"  (1.6 m)   Wt 216 lb (98 kg)   SpO2 92%   BMI 38.26 kg/m   GEN: A/Ox3; pleasant , NAD, well nourished , walker    HEENT:  [place]/AT,   , NOSE-clear, THROAT-clear, no lesions, no postnasal drip or exudate noted.   NECK:  Supple w/ fair ROM; no JVD; normal carotid impulses w/o bruits; no thyromegaly or nodules palpated; no lymphadenopathy.    RESP  Clear  P & A; w/o, wheezes/ rales/ or rhonchi. no accessory muscle use, no dullness to percussion  CARD:  RRR, no m/r/g, tr  peripheral edema, pulses intact, no cyanosis or clubbing.  GI:   Soft & nt; nml bowel sounds; no organomegaly or masses detected.   [place]: Warm bil, walker, chronic gait imbalance with LE deformities   Neuro: alert, no focal deficits noted.    Skin: Warm, no lesions or rashes       BNP No results found for: BNP  ProBNP No results found for: PROBNP  Imaging: No results found.    No flowsheet data found.  No results found for: NITRICOXIDE      Assessment & Plan:   Asthma, chronic Mild to moderate persistent asthma well-controlled on current maintenance regimen.  Patient to continue on trigger prevention.  Asthma action plan discussed. Chest xray today   Plan  Patient Instructions  Continue Allegra 180mg  daily in am.  Continue on Symbicort Twice daily ,  rinse after use.   Continue on Flonase daily .  Chest xray today .  Look at getting second Covid 19 booster.  Follow with up 6 months with Dr. Melvyn Novas  or Kelvin Sennett NP and As needed   Please contact office for sooner follow up if symptoms do not improve or worsen or seek emergency care       Chronic rhinitis Under good control.  Continue on maintenance regimen  Severe obesity (BMI >= 40) (HCC) Patient's weight has been trending down.  However patient says he has not been trying to lose weight.  Weight is down 10 to 15 pounds over the last couple years.  Asked him to keep a watch on his weight and discussed with primary care provider if continues to decrease unintentionally.  Advised on a healthy weight and healthy weight loss.  We will check chest x-ray today.    I spent  30 minutes dedicated to the care of this patient on the date of this encounter to include pre-visit review of records, face-to-face time with the patient discussing conditions above, post visit ordering of testing, clinical documentation with the electronic health record, making appropriate referrals as documented, and communicating necessary findings to members of the patients care team.   Rexene Edison, NP 06/07/2021

## 2021-06-07 NOTE — Assessment & Plan Note (Signed)
Patient's weight has been trending down.  However patient says he has not been trying to lose weight.  Weight is down 10 to 15 pounds over the last couple years.  Asked him to keep a watch on his weight and discussed with primary care provider if continues to decrease unintentionally.  Advised on a healthy weight and healthy weight loss.  We will check chest x-ray today.

## 2021-06-07 NOTE — Assessment & Plan Note (Signed)
Under good control.  Continue on maintenance regimen

## 2021-06-07 NOTE — Assessment & Plan Note (Signed)
Mild to moderate persistent asthma well-controlled on current maintenance regimen.  Patient to continue on trigger prevention.  Asthma action plan discussed. Chest xray today   Plan  Patient Instructions  Continue Allegra 180mg  daily in am.  Continue on Symbicort Twice daily , rinse after use.  Continue on Flonase daily .  Chest xray today .  Look at getting second Covid 19 booster.  Follow with up 6 months with Dr. Melvyn Novas  or Mark Benecke NP and As needed   Please contact office for sooner follow up if symptoms do not improve or worsen or seek emergency care

## 2021-06-07 NOTE — Patient Instructions (Addendum)
Continue Allegra 180mg  daily in am.  Continue on Symbicort Twice daily , rinse after use.  Continue on Flonase daily .  Chest xray today .  Look at getting second Covid 19 booster.  Follow with up 6 months with Dr. Melvyn Novas  or Telsa Dillavou NP and As needed   Please contact office for sooner follow up if symptoms do not improve or worsen or seek emergency care

## 2021-07-04 DIAGNOSIS — Z23 Encounter for immunization: Secondary | ICD-10-CM | POA: Diagnosis not present

## 2021-08-10 DIAGNOSIS — H353132 Nonexudative age-related macular degeneration, bilateral, intermediate dry stage: Secondary | ICD-10-CM | POA: Diagnosis not present

## 2021-08-10 DIAGNOSIS — H3562 Retinal hemorrhage, left eye: Secondary | ICD-10-CM | POA: Diagnosis not present

## 2021-08-10 DIAGNOSIS — G453 Amaurosis fugax: Secondary | ICD-10-CM | POA: Diagnosis not present

## 2021-08-10 DIAGNOSIS — H35373 Puckering of macula, bilateral: Secondary | ICD-10-CM | POA: Diagnosis not present

## 2021-08-10 DIAGNOSIS — H35363 Drusen (degenerative) of macula, bilateral: Secondary | ICD-10-CM | POA: Diagnosis not present

## 2021-08-10 DIAGNOSIS — H402232 Chronic angle-closure glaucoma, bilateral, moderate stage: Secondary | ICD-10-CM | POA: Diagnosis not present

## 2021-08-17 DIAGNOSIS — K5100 Ulcerative (chronic) pancolitis without complications: Secondary | ICD-10-CM | POA: Diagnosis not present

## 2021-08-17 DIAGNOSIS — K649 Unspecified hemorrhoids: Secondary | ICD-10-CM | POA: Diagnosis not present

## 2021-08-17 DIAGNOSIS — K219 Gastro-esophageal reflux disease without esophagitis: Secondary | ICD-10-CM | POA: Diagnosis not present

## 2021-08-17 DIAGNOSIS — Z86010 Personal history of colonic polyps: Secondary | ICD-10-CM | POA: Diagnosis not present

## 2021-08-23 ENCOUNTER — Other Ambulatory Visit: Payer: Self-pay | Admitting: Cardiovascular Disease

## 2021-08-24 ENCOUNTER — Other Ambulatory Visit: Payer: Self-pay

## 2021-08-24 ENCOUNTER — Encounter (INDEPENDENT_AMBULATORY_CARE_PROVIDER_SITE_OTHER): Payer: Medicare Other | Admitting: Ophthalmology

## 2021-08-24 DIAGNOSIS — H353221 Exudative age-related macular degeneration, left eye, with active choroidal neovascularization: Secondary | ICD-10-CM | POA: Diagnosis not present

## 2021-08-24 DIAGNOSIS — H43813 Vitreous degeneration, bilateral: Secondary | ICD-10-CM | POA: Diagnosis not present

## 2021-08-24 DIAGNOSIS — H35371 Puckering of macula, right eye: Secondary | ICD-10-CM

## 2021-08-24 DIAGNOSIS — H353112 Nonexudative age-related macular degeneration, right eye, intermediate dry stage: Secondary | ICD-10-CM | POA: Diagnosis not present

## 2021-08-24 DIAGNOSIS — H35033 Hypertensive retinopathy, bilateral: Secondary | ICD-10-CM

## 2021-08-24 DIAGNOSIS — I10 Essential (primary) hypertension: Secondary | ICD-10-CM

## 2021-08-24 DIAGNOSIS — H35341 Macular cyst, hole, or pseudohole, right eye: Secondary | ICD-10-CM

## 2021-09-01 DIAGNOSIS — Z23 Encounter for immunization: Secondary | ICD-10-CM | POA: Diagnosis not present

## 2021-09-18 ENCOUNTER — Other Ambulatory Visit: Payer: Self-pay | Admitting: Adult Health

## 2021-09-21 ENCOUNTER — Encounter (INDEPENDENT_AMBULATORY_CARE_PROVIDER_SITE_OTHER): Payer: Medicare Other | Admitting: Ophthalmology

## 2021-09-21 ENCOUNTER — Other Ambulatory Visit: Payer: Self-pay

## 2021-09-21 DIAGNOSIS — I10 Essential (primary) hypertension: Secondary | ICD-10-CM | POA: Diagnosis not present

## 2021-09-21 DIAGNOSIS — H353221 Exudative age-related macular degeneration, left eye, with active choroidal neovascularization: Secondary | ICD-10-CM | POA: Diagnosis not present

## 2021-09-21 DIAGNOSIS — H353112 Nonexudative age-related macular degeneration, right eye, intermediate dry stage: Secondary | ICD-10-CM

## 2021-09-21 DIAGNOSIS — H35371 Puckering of macula, right eye: Secondary | ICD-10-CM | POA: Diagnosis not present

## 2021-09-21 DIAGNOSIS — H43813 Vitreous degeneration, bilateral: Secondary | ICD-10-CM | POA: Diagnosis not present

## 2021-09-21 DIAGNOSIS — H35033 Hypertensive retinopathy, bilateral: Secondary | ICD-10-CM | POA: Diagnosis not present

## 2021-09-21 DIAGNOSIS — H35341 Macular cyst, hole, or pseudohole, right eye: Secondary | ICD-10-CM

## 2021-09-28 ENCOUNTER — Ambulatory Visit: Payer: Medicare Other | Attending: Internal Medicine

## 2021-09-28 DIAGNOSIS — Z23 Encounter for immunization: Secondary | ICD-10-CM

## 2021-09-28 NOTE — Progress Notes (Signed)
   Covid-19 Vaccination Clinic  Name:  Barry [name]    MRN: [id number] DOB: 1946-06-22  09/28/2021  [name] was observed post Covid-19 immunization for 15 minutes without incident. He was provided with Vaccine Information Sheet and instruction to access the V-Safe system.   [name] was instructed to call 911 with any severe reactions post vaccine: Difficulty breathing  Swelling of face and throat  A fast heartbeat  A bad rash all over body  Dizziness and weakness

## 2021-10-19 ENCOUNTER — Encounter (INDEPENDENT_AMBULATORY_CARE_PROVIDER_SITE_OTHER): Payer: Medicare Other | Admitting: Ophthalmology

## 2021-10-19 ENCOUNTER — Other Ambulatory Visit: Payer: Self-pay

## 2021-10-19 DIAGNOSIS — H35033 Hypertensive retinopathy, bilateral: Secondary | ICD-10-CM | POA: Diagnosis not present

## 2021-10-19 DIAGNOSIS — H353221 Exudative age-related macular degeneration, left eye, with active choroidal neovascularization: Secondary | ICD-10-CM | POA: Diagnosis not present

## 2021-10-19 DIAGNOSIS — I10 Essential (primary) hypertension: Secondary | ICD-10-CM | POA: Diagnosis not present

## 2021-10-19 DIAGNOSIS — H353112 Nonexudative age-related macular degeneration, right eye, intermediate dry stage: Secondary | ICD-10-CM | POA: Diagnosis not present

## 2021-10-19 DIAGNOSIS — H43813 Vitreous degeneration, bilateral: Secondary | ICD-10-CM | POA: Diagnosis not present

## 2021-10-20 ENCOUNTER — Other Ambulatory Visit (HOSPITAL_BASED_OUTPATIENT_CLINIC_OR_DEPARTMENT_OTHER): Payer: Self-pay

## 2021-10-20 DIAGNOSIS — Z23 Encounter for immunization: Secondary | ICD-10-CM | POA: Diagnosis not present

## 2021-10-20 MED ORDER — PFIZER COVID-19 VAC BIVALENT 30 MCG/0.3ML IM SUSP
INTRAMUSCULAR | 0 refills | Status: DC
  Filled 2021-10-20: qty 0.3, 1d supply, fill #0

## 2021-10-22 IMAGING — DX DG CHEST 2V
2 series · 2 of 2 positions shown · non-contrast
Comparison: 08/19/2015

CLINICAL DATA: 75-year-old male with a history asthma

EXAM:
CHEST - 2 VIEW

[chest pa]
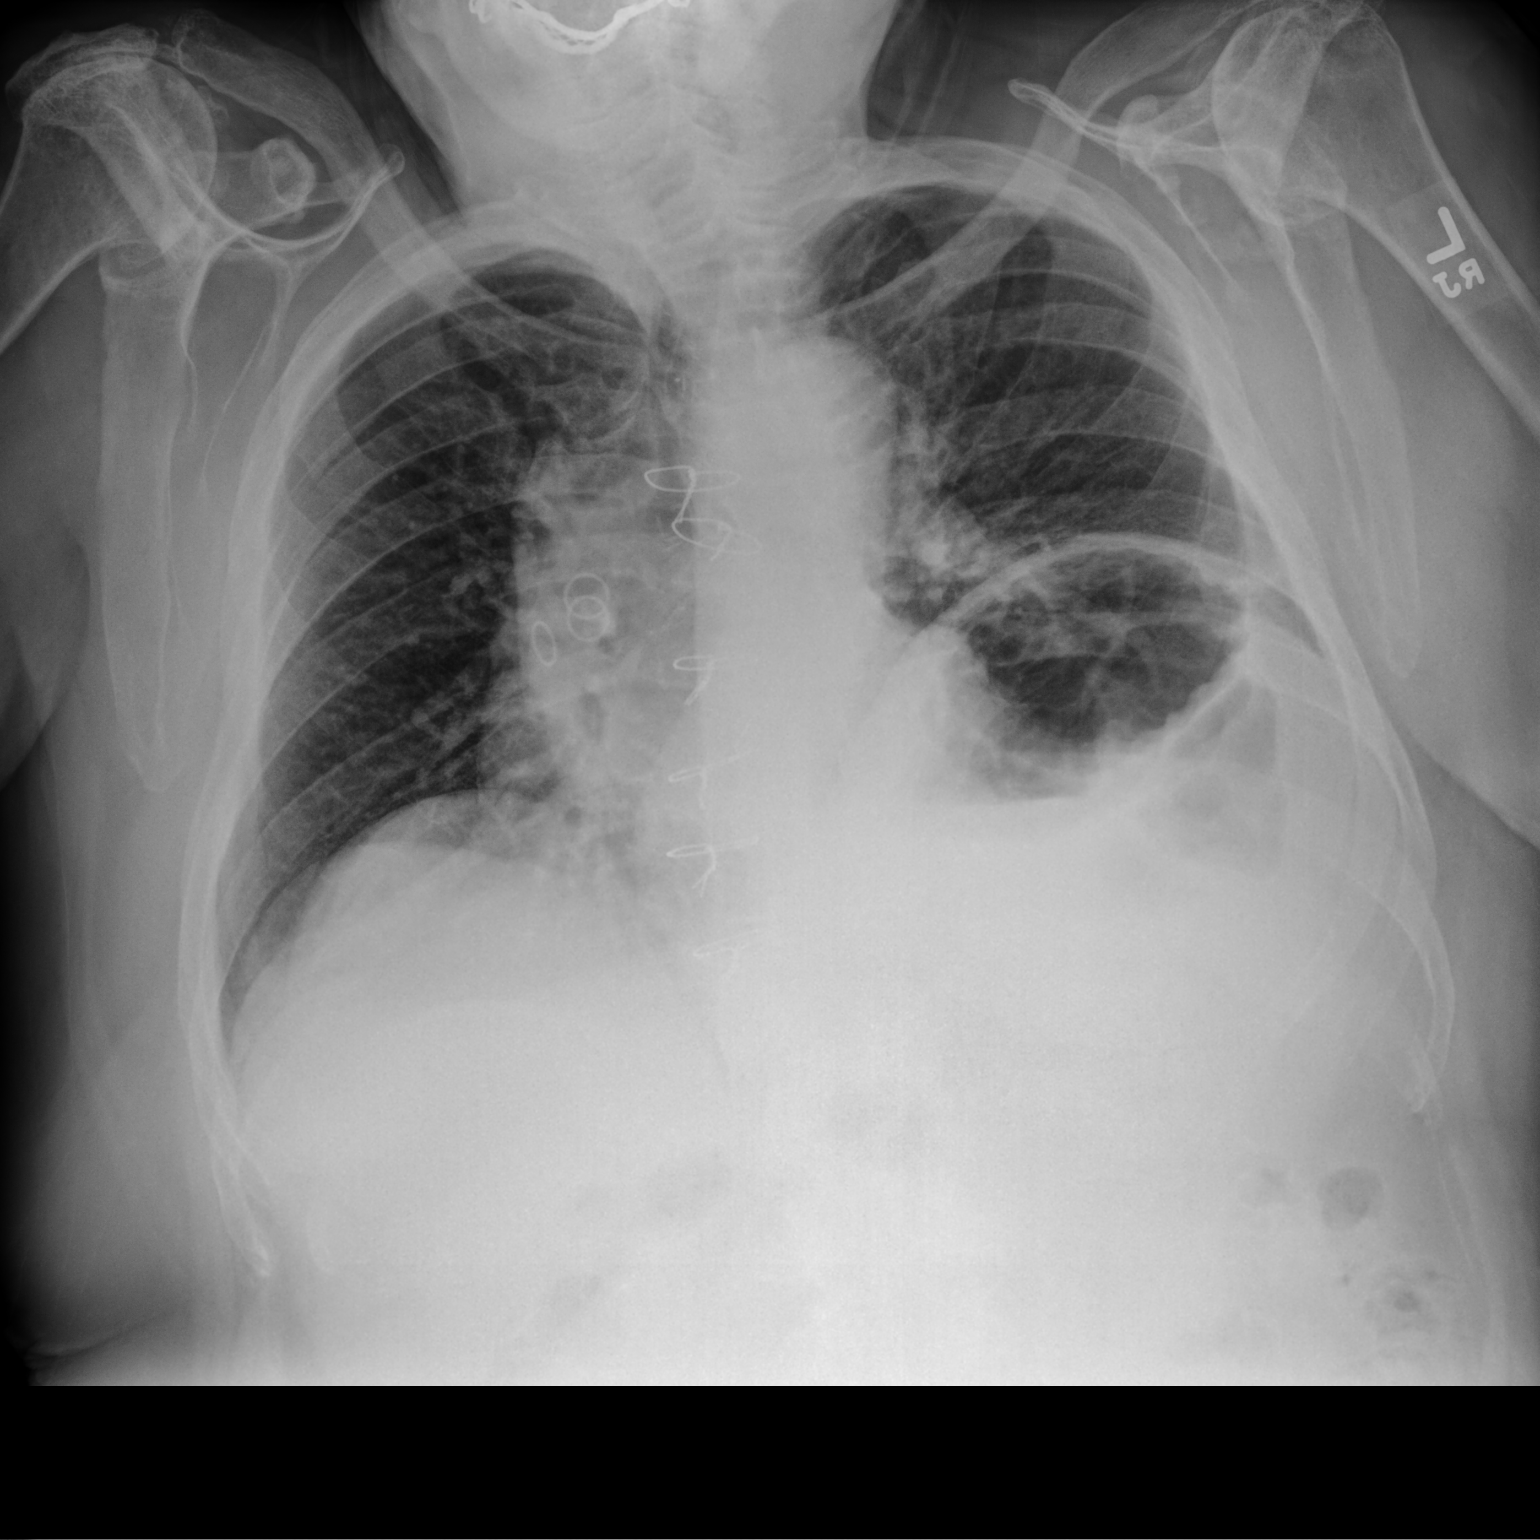

[chest lat]
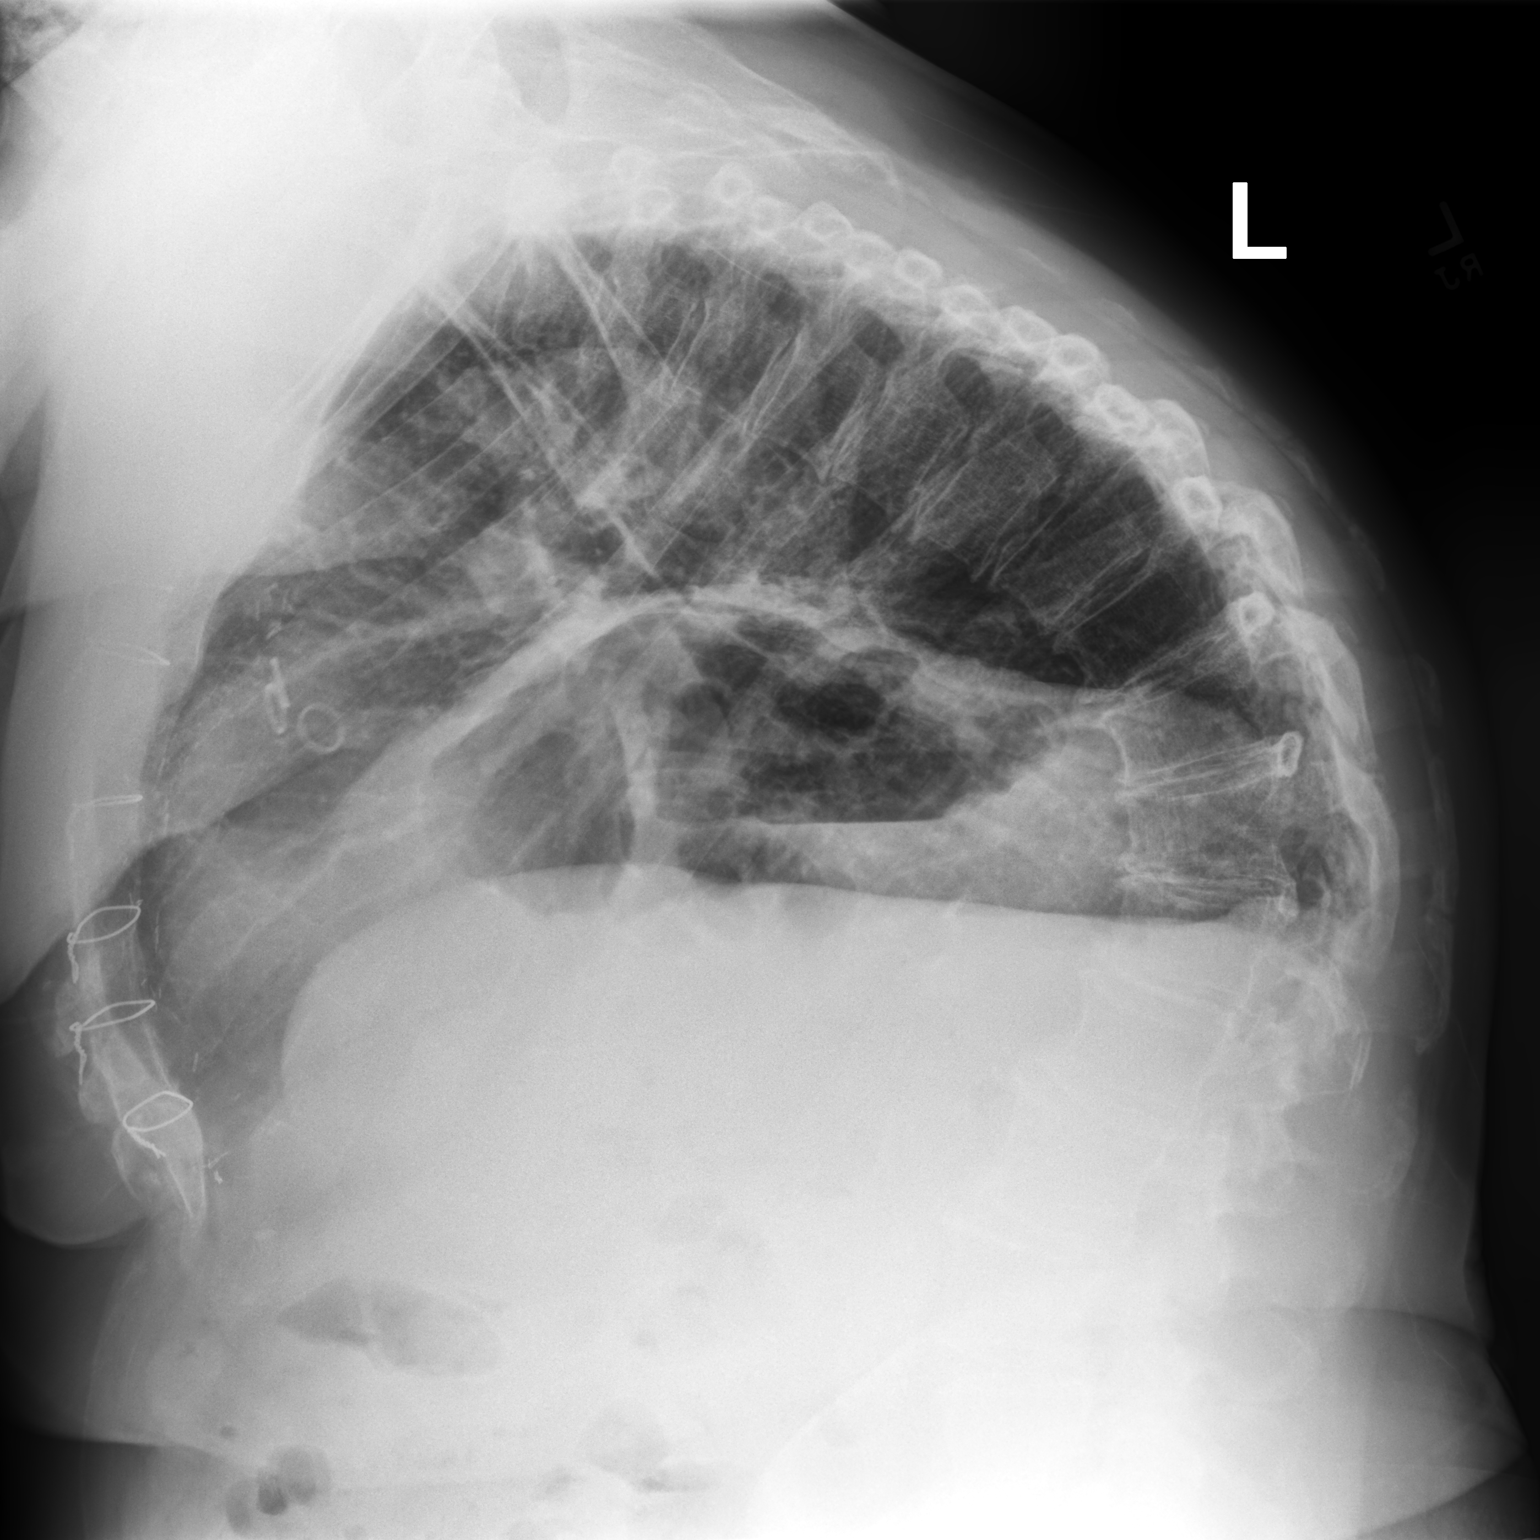

[2 of 2 positions shown; findings below may reference images not displayed]

FINDINGS: Cardiomediastinal silhouette unchanged. Surgical changes of median
sternotomy and CABG. Right rotation somewhat limits evaluation.

No evidence of interlobular septal thickening or central vascular
congestion.

Asymmetric elevation of the left hemidiaphragm with interposed gas
bubble again demonstrated.

No pleural effusion. No pneumothorax. No new confluent airspace
disease.

Degenerative changes of the spine. Accentuated kyphotic deformity
again noted. No acute displaced fracture identified.
IMPRESSION: Negative for acute cardiopulmonary disease.

Surgical changes of median sternotomy and CABG.

## 2021-11-01 DIAGNOSIS — I252 Old myocardial infarction: Secondary | ICD-10-CM | POA: Diagnosis not present

## 2021-11-01 DIAGNOSIS — E7800 Pure hypercholesterolemia, unspecified: Secondary | ICD-10-CM | POA: Diagnosis not present

## 2021-11-01 DIAGNOSIS — F341 Dysthymic disorder: Secondary | ICD-10-CM | POA: Diagnosis not present

## 2021-11-01 DIAGNOSIS — K5100 Ulcerative (chronic) pancolitis without complications: Secondary | ICD-10-CM | POA: Diagnosis not present

## 2021-11-01 DIAGNOSIS — K219 Gastro-esophageal reflux disease without esophagitis: Secondary | ICD-10-CM | POA: Diagnosis not present

## 2021-11-01 DIAGNOSIS — Z1389 Encounter for screening for other disorder: Secondary | ICD-10-CM | POA: Diagnosis not present

## 2021-11-01 DIAGNOSIS — Z125 Encounter for screening for malignant neoplasm of prostate: Secondary | ICD-10-CM | POA: Diagnosis not present

## 2021-11-01 DIAGNOSIS — R7309 Other abnormal glucose: Secondary | ICD-10-CM | POA: Diagnosis not present

## 2021-11-01 DIAGNOSIS — Z0000 Encounter for general adult medical examination without abnormal findings: Secondary | ICD-10-CM | POA: Diagnosis not present

## 2021-11-01 DIAGNOSIS — I2510 Atherosclerotic heart disease of native coronary artery without angina pectoris: Secondary | ICD-10-CM | POA: Diagnosis not present

## 2021-11-01 DIAGNOSIS — Z951 Presence of aortocoronary bypass graft: Secondary | ICD-10-CM | POA: Diagnosis not present

## 2021-11-01 DIAGNOSIS — J45909 Unspecified asthma, uncomplicated: Secondary | ICD-10-CM | POA: Diagnosis not present

## 2021-11-01 DIAGNOSIS — I10 Essential (primary) hypertension: Secondary | ICD-10-CM | POA: Diagnosis not present

## 2021-11-01 DIAGNOSIS — D649 Anemia, unspecified: Secondary | ICD-10-CM | POA: Diagnosis not present

## 2021-11-16 DIAGNOSIS — D649 Anemia, unspecified: Secondary | ICD-10-CM | POA: Diagnosis not present

## 2021-11-17 ENCOUNTER — Encounter (INDEPENDENT_AMBULATORY_CARE_PROVIDER_SITE_OTHER): Payer: Medicare Other | Admitting: Ophthalmology

## 2021-11-17 ENCOUNTER — Other Ambulatory Visit: Payer: Self-pay

## 2021-11-17 DIAGNOSIS — H35033 Hypertensive retinopathy, bilateral: Secondary | ICD-10-CM

## 2021-11-17 DIAGNOSIS — H353221 Exudative age-related macular degeneration, left eye, with active choroidal neovascularization: Secondary | ICD-10-CM

## 2021-11-17 DIAGNOSIS — H43813 Vitreous degeneration, bilateral: Secondary | ICD-10-CM

## 2021-11-17 DIAGNOSIS — H353112 Nonexudative age-related macular degeneration, right eye, intermediate dry stage: Secondary | ICD-10-CM

## 2021-11-17 DIAGNOSIS — H35371 Puckering of macula, right eye: Secondary | ICD-10-CM

## 2021-11-17 DIAGNOSIS — I10 Essential (primary) hypertension: Secondary | ICD-10-CM | POA: Diagnosis not present

## 2021-11-22 ENCOUNTER — Other Ambulatory Visit: Payer: Self-pay | Admitting: Cardiovascular Disease

## 2021-12-19 ENCOUNTER — Telehealth: Payer: Self-pay | Admitting: Adult Health

## 2021-12-19 MED ORDER — AZITHROMYCIN 250 MG PO TABS: ORAL_TABLET | ORAL | 0 refills | Status: DC

## 2021-12-19 MED ORDER — PREDNISONE 10 MG PO TABS: ORAL_TABLET | ORAL | 0 refills | Status: DC

## 2021-12-19 NOTE — Telephone Encounter (Signed)
Prefer he get checked for flu and covid at [facility] but if declines then ok to do zpak and Prednisone 10 mg take  4 each am x 2 days,   2 each am x 2 days,  1 each am x 2 days and stop

## 2021-12-19 NOTE — Telephone Encounter (Signed)
Called and spoke with patient. He verbalized understanding and stated that he would prefer to have the zpak and prednisone. RXs [facility] [address].   Nothing further needed at time of call.

## 2021-12-19 NOTE — Telephone Encounter (Signed)
Called and spoke with patient who states that on Christmas he started feeling bad and having some congestion. States he got some mucinex and it got better but over the last couple days he has been having symptoms sore throat, wheezing and chest congestion. Productive cough with tan/clear sputum. States that he has not checked his temp and has not done a covid test. [facility]acy is [facility].    Dr. Melvyn Novas please advise

## 2021-12-20 ENCOUNTER — Other Ambulatory Visit: Payer: Self-pay

## 2021-12-20 ENCOUNTER — Encounter (INDEPENDENT_AMBULATORY_CARE_PROVIDER_SITE_OTHER): Payer: Medicare Other | Admitting: Ophthalmology

## 2021-12-20 ENCOUNTER — Ambulatory Visit: Payer: Medicare Other | Admitting: Internal Medicine

## 2021-12-20 DIAGNOSIS — H35033 Hypertensive retinopathy, bilateral: Secondary | ICD-10-CM

## 2021-12-20 DIAGNOSIS — H353221 Exudative age-related macular degeneration, left eye, with active choroidal neovascularization: Secondary | ICD-10-CM

## 2021-12-20 DIAGNOSIS — H43813 Vitreous degeneration, bilateral: Secondary | ICD-10-CM | POA: Diagnosis not present

## 2021-12-20 DIAGNOSIS — I10 Essential (primary) hypertension: Secondary | ICD-10-CM | POA: Diagnosis not present

## 2021-12-20 DIAGNOSIS — H353112 Nonexudative age-related macular degeneration, right eye, intermediate dry stage: Secondary | ICD-10-CM | POA: Diagnosis not present

## 2021-12-27 ENCOUNTER — Other Ambulatory Visit: Payer: Self-pay | Admitting: Cardiovascular Disease

## 2021-12-29 DIAGNOSIS — K5100 Ulcerative (chronic) pancolitis without complications: Secondary | ICD-10-CM | POA: Diagnosis not present

## 2021-12-29 DIAGNOSIS — I10 Essential (primary) hypertension: Secondary | ICD-10-CM | POA: Diagnosis not present

## 2021-12-29 DIAGNOSIS — I2510 Atherosclerotic heart disease of native coronary artery without angina pectoris: Secondary | ICD-10-CM | POA: Diagnosis not present

## 2021-12-29 DIAGNOSIS — E7800 Pure hypercholesterolemia, unspecified: Secondary | ICD-10-CM | POA: Diagnosis not present

## 2021-12-29 DIAGNOSIS — J45909 Unspecified asthma, uncomplicated: Secondary | ICD-10-CM | POA: Diagnosis not present

## 2021-12-29 DIAGNOSIS — K219 Gastro-esophageal reflux disease without esophagitis: Secondary | ICD-10-CM | POA: Diagnosis not present

## 2022-01-25 ENCOUNTER — Other Ambulatory Visit: Payer: Self-pay

## 2022-01-25 ENCOUNTER — Encounter (INDEPENDENT_AMBULATORY_CARE_PROVIDER_SITE_OTHER): Payer: Medicare Other | Admitting: Ophthalmology

## 2022-01-25 DIAGNOSIS — H353221 Exudative age-related macular degeneration, left eye, with active choroidal neovascularization: Secondary | ICD-10-CM

## 2022-01-25 DIAGNOSIS — H353112 Nonexudative age-related macular degeneration, right eye, intermediate dry stage: Secondary | ICD-10-CM

## 2022-01-25 DIAGNOSIS — H35033 Hypertensive retinopathy, bilateral: Secondary | ICD-10-CM

## 2022-01-25 DIAGNOSIS — H43813 Vitreous degeneration, bilateral: Secondary | ICD-10-CM | POA: Diagnosis not present

## 2022-01-25 DIAGNOSIS — I10 Essential (primary) hypertension: Secondary | ICD-10-CM | POA: Diagnosis not present

## 2022-02-26 ENCOUNTER — Other Ambulatory Visit: Payer: Self-pay | Admitting: Cardiovascular Disease

## 2022-03-05 ENCOUNTER — Other Ambulatory Visit: Payer: Self-pay

## 2022-03-05 ENCOUNTER — Encounter (INDEPENDENT_AMBULATORY_CARE_PROVIDER_SITE_OTHER): Payer: Medicare Other | Admitting: Ophthalmology

## 2022-03-05 DIAGNOSIS — I10 Essential (primary) hypertension: Secondary | ICD-10-CM

## 2022-03-05 DIAGNOSIS — H43813 Vitreous degeneration, bilateral: Secondary | ICD-10-CM

## 2022-03-05 DIAGNOSIS — H353221 Exudative age-related macular degeneration, left eye, with active choroidal neovascularization: Secondary | ICD-10-CM

## 2022-03-05 DIAGNOSIS — H353112 Nonexudative age-related macular degeneration, right eye, intermediate dry stage: Secondary | ICD-10-CM

## 2022-03-05 DIAGNOSIS — H35033 Hypertensive retinopathy, bilateral: Secondary | ICD-10-CM

## 2022-03-09 NOTE — Progress Notes (Signed)
?  ?Cardiology Office Note ? ? ?Date:  03/21/2022  ? ?ID:  Barry [name], DOB [birth year], MRN [id number] ? ?PCP:  [name], MD  ?Cardiologist:   Jenkins Rouge, MD  ? ?No chief complaint on file. ? ? ?  ?History of Present Illness: ?Barry [name] is a 76 y.o. male who was first seen in March 2020  regarding CAD Referred by Dr Lysle Rubens. Previous patient of Dr Wynonia Lawman He sees pulmonary for paralyzed left hemi diaphragm asthma and upper airway cough syndrome Uses symbicort, flonase and allegra  ? ?History of CAD with CABG in August 2001 HTN and HLD on statin He has  ?UC and requires sulfasalazine and steriods ? ?He has LE orthopedic issues and uses a walker ?Lives alone drives no real family.  ? ?No angina some fatigue and dyspnea ? ?Echo 11/06/19 done for fatigue showed EF 50-55% with no pulmonary HTN and AV sclerosis ? ?His caretaker/friend Margaretha Sheffield asked if he could have orthopedic surgery. They do not have one and I told them to see if anything can be done for his legs and we would risk stratify his heart  ? ? ?Past Medical History:  ?Diagnosis Date  ? A-fib ([place])   ? Acute sinusitis 03/24/2012  ? Followed in Pulmonary clinic/ [facility]/ [name] - CT sinus 08/07/2013  Short air-fluid levels in the maxillary sinuses bilaterally suggesting early acute sinusitis. - repeat ct sinus 04/30/2014 > ok    ? Acute sinusitis, unspecified 08/14/2010  ? Qualifier: Diagnosis of  By: [name], [name]    ? Asthma   ? PFTs 06/15/05 FEV1 85% predicted ratio 68% and truncation of resp loop in a sawtooth pattern. HFA 25% 08-01-2009 >50% Nov 01, 2009>50% 01-31-2010  ? Asthma, chronic 11/24/2007  ? Followed in Pulmonary clinic/ [facility]/ [name]  - PFTs 06/15/2005 FEV1 85% predicted ratio 68% and truncation of respiratory loop in a sawtooth pattern  - HFA 25% August 01, 2009 > 50% November 01, 2009 > 50% January 31, 2010 therefore changed to neb bud/brovana - HFA 75% p extensive coaching 08/07/2013  > insurance issues so try symbicort  160 2 bid instead of neb - 11/16/2015   p extensive  ? BRONCHITIS, ACUTE 01/22/2011  ? Qualifier: Diagnosis of  By: [name], [name]    ? CAD (coronary artery disease), native coronary artery 02/06/2019  ? S/p CABG 1540 complicated by paralyzed left hemidiaphram  ? Cataract   ? Chronic cough   ? Chronic rhinitis 05/02/2009  ? Followed in Pulmonary clinic/ [facility]/ [name]    - 03/24/12 CT Sinus > Negative paranasal sinuses - Sinus CT 08/07/2013 >>Short air-fluid levels in the maxillary sinuses bilaterally suggesting early acute sinusitis - Repeat augmentin x 21 days 03/27/2014 then sinus ct if not better  - flonase/afrin regimen 02/11/15 >>>     ? Coronary heart disease   ? COUGH, CHRONIC 11/24/2007  ? Followed in Pulmonary clinic/ [facility]/ [name]     ? DDD (degenerative disc disease), lumbar   ? 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  ? Followed in Pulmonary clinic/ [facility]/ [name]  -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    ? Excessive ear wax, bilateral 09/15/2018  ? GASTROESOPHAGEAL REFLUX DISEASE 08/27/2008  ? Followed as Primary Care Patient/ GI/ Dr  Earle Gell    ? Glaucoma   ? Hyperlipidemia LDL goal <70 02/06/2019  ? Hypertension   ? Obesity   ? Osteoarthritis   ? Paralyzed hemidiaphragm   ? left,  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  ? Retina disorder 2014  ? Severe obesity (BMI >= 40) ([place]) 11/24/2007  ?     - Target wt < 191 to get under BMI 30   ? Ulcerative colitis   ? ULCERATIVE COLITIS 11/24/2007  ? Followed as Primary Care Patient/ GI/ Dr  Earle Gell    ? URI 04/09/2008  ? Qualifier: Diagnosis of  By: [name], [name]    ? ? ?Past Surgical History:  ?Procedure Laterality Date  ? CARDIAC CATHETERIZATION Left   ? COLONOSCOPY WITH PROPOFOL N/A 11/29/2015  ? Procedure: COLONOSCOPY WITH PROPOFOL;  Surgeon: [name], MD;  Location: [facility];  Service: Endoscopy;  Laterality: N/A;  ? CORONARY ARTERY BYPASS GRAFT    ? x6  ? ? ? ?Current Outpatient Medications  ?Medication Sig Dispense Refill   ? acetaminophen (TYLENOL) 500 MG tablet Take per bottle as needed for arthritis    ? aspirin 81 MG tablet Take 81 mg by mouth daily.    ? atorvastatin (LIPITOR) 40 MG tablet TAKE 1 TABLET (40 MG TOTAL) BY MOUTH DAILY. PLEASE MAKE YEARLY APPT WITH DR. Johnsie Cancel FOR [facility] 2023 FOR FUTURE REFILLS. THANK YOU 1ST ATTEMPT 90 tablet 0  ? betamethasone dipropionate 0.05 % lotion As directed    ? budesonide-formoterol (SYMBICORT) 160-4.5 MCG/ACT inhaler TAKE 2 PUFFS BY MOUTH TWICE A DAY 30.6 each 12  ? carboxymethylcellulose (REFRESH PLUS) 0.5 % SOLN Use as needed for dry eye    ? Cholecalciferol (VITAMIN D) 50 MCG (2000 UT) tablet Take 2,000 Units by mouth daily.     ? cycloSPORINE (RESTASIS) 0.05 % ophthalmic emulsion Place 1 drop into both eyes 2 (two) times daily.    ? diltiazem (CARDIZEM CD) 240 MG 24 hr capsule TAKE 1 CAPSULE BY MOUTH EVERY DAY 90 capsule 0  ? docusate sodium (COLACE) 100 MG capsule Take 100 mg by mouth daily as needed for mild constipation.    ? Eyelid Cleansers (OCUSOFT EYELID CLEANSING) PADS Apply topically. Use daily    ? ezetimibe (ZETIA) 10 MG tablet TAKE 1 TABLET BY MOUTH EVERY DAY 90 tablet 3  ? fexofenadine (ALLEGRA) 180 MG tablet Take 180 mg by mouth daily.    ? fluticasone (FLONASE) 50 MCG/ACT nasal spray Place 2 sprays into both nostrils 2 (two) times daily.     ? latanoprost (XALATAN) 0.005 % ophthalmic solution Place 1 drop into both eyes at bedtime.    ? metoprolol tartrate (LOPRESSOR) 25 MG tablet TAKE 1 TABLET BY MOUTH TWICE A DAY 180 tablet 0  ? montelukast (SINGULAIR) 10 MG tablet ONE AT BEDTIME EVERY NIGHT 90 tablet 3  ? Multiple Vitamins-Minerals (CENTRUM SILVER PO) Take 1 tablet by mouth daily.    ? nitroGLYCERIN (NITROSTAT) 0.4 MG SL tablet Place 1 tablet (0.4 mg total) under the tongue every 5 (five) minutes as needed for chest pain. 25 tablet 3  ? omeprazole (PRILOSEC) 20 MG capsule Take 20 mg by mouth 2 (two) times daily before a meal.    ? Polyethyl Glyc-Propyl Glyc PF  0.4-0.3 % SOLN Apply 1 drop to eye as needed.    ? Pseudoephedrine-Ibuprofen 30-200 MG TABS Per bottle as needed for sinus pressure/pain    ? Spacer/Aero-Holding Chambers (AEROCHAMBER MV) inhaler Use as instructed 1 each 0  ? sulfaSALAzine (AZULFIDINE) 500 MG tablet Take 1,000 mg by mouth 4 (four) times daily.    ? VENTOLIN HFA 108 (90 Base) MCG/ACT inhaler INHALE 2 PUFFS INTO THE LUNGS EVERY 4  HRS AS NEEDED FOR WHEEZING OR SHORTNESS OF BREATH 18 Inhaler 5  ? azithromycin (ZITHROMAX) 250 MG tablet Take 2 tablets on first day, then 1 tablet daily until finished. (Patient not taking: Reported on 03/21/2022) 6 tablet 0  ? COVID-19 mRNA bivalent vaccine, Pfizer, (PFIZER COVID-19 VAC BIVALENT) injection Inject into the muscle. (Patient not taking: Reported on 03/21/2022) 0.3 mL 0  ? predniSONE (DELTASONE) 10 MG tablet Take 4 tabs x 2 days, 2 tabs x 2 days, then 1 tab x 2 days and stop. (Patient not taking: Reported on 03/21/2022) 14 tablet 0  ? ?No current facility-administered medications for this visit.  ? ? ?Allergies:   Piroxicam  ? ? ?Social History:  The patient  reports that he has never smoked. He has never used smokeless tobacco. He reports that he does not drink alcohol and does not use drugs.  ? ?Family History:  The patient's family history includes Heart disease in his father; Hypertension in his mother; Other in his sister; Pulmonary Hypertension in his mother; Renal Disease in his mother.  ? ? ?ROS:  Please see the history of present illness.   Otherwise, review of systems are positive for none.   All other systems are reviewed and negative.  ? ? ?PHYSICAL EXAM: ?VS:  BP 138/70   Pulse 77   Ht '5\' 3"'$  (1.6 m)   Wt 209 lb (94.8 kg)   SpO2 95%   BMI 37.02 kg/m?  , BMI Body mass index is 37.02 kg/m?. ?Affect appropriate ?Overweight white male Chronically ill  ?HEENT: normal ?Neck supple with no adenopathy ?JVP normal no bruits no thyromegaly ?Lungs clear with no wheezing and good diaphragmatic motion ?Heart:   S1/S2 no murmur, no rub, gallop or click ?PMI normal post sternotomy  ?Abdomen: benighn, BS positve, no tenderness, no AAA ?no bruit.  No HSM or HJR ?Distal pulses intact with no bruits ?No edema ?Neuro non-focal

## 2022-03-21 ENCOUNTER — Encounter: Payer: Self-pay | Admitting: Cardiovascular Disease

## 2022-03-21 ENCOUNTER — Ambulatory Visit (INDEPENDENT_AMBULATORY_CARE_PROVIDER_SITE_OTHER): Payer: Medicare Other | Admitting: Cardiovascular Disease

## 2022-03-21 VITALS — BP 138/70 | HR 77 | Ht 63.0 in | Wt 209.0 lb

## 2022-03-21 DIAGNOSIS — I10 Essential (primary) hypertension: Secondary | ICD-10-CM | POA: Diagnosis not present

## 2022-03-21 DIAGNOSIS — I420 Dilated cardiomyopathy: Secondary | ICD-10-CM

## 2022-03-21 DIAGNOSIS — R5383 Other fatigue: Secondary | ICD-10-CM | POA: Diagnosis not present

## 2022-03-21 DIAGNOSIS — I25810 Atherosclerosis of coronary artery bypass graft(s) without angina pectoris: Secondary | ICD-10-CM | POA: Diagnosis not present

## 2022-03-21 NOTE — Patient Instructions (Signed)
Medication Instructions:  ?Your physician recommends that you continue on your current medications as directed. Please refer to the Current Medication list given to you today. ? ?*If you need a refill on your cardiac medications before your next appointment, please call your pharmacy* ? ?Lab Work: ?If you have labs (blood work) drawn today and your tests are completely normal, you will receive your results only by: ?MyChart Message (if you have MyChart) OR ?A paper copy in the mail ?If you have any lab test that is abnormal or we need to change your treatment, we will call you to review the results. ? ?Testing/Procedures: ?Your physician has requested that you have an echocardiogram. Echocardiography is a painless test that uses sound waves to create images of your heart. It provides your doctor with information about the size and shape of your heart and how well your heart?s chambers and valves are working. This procedure takes approximately one hour. There are no restrictions for this procedure. ? ?Follow-Up: ?At [facility], you and your health needs are our priority.  As part of our continuing mission to provide you with exceptional heart care, we have created designated Provider Care Teams.  These Care Teams include your primary Cardiologist (physician) and Advanced Practice Providers (APPs -  Physician Assistants and Nurse Practitioners) who all work together to provide you with the care you need, when you need it. ? ?We recommend signing up for the patient portal called "MyChart".  Sign up information is provided on this After Visit Summary.  MyChart is used to connect with patients for Virtual Visits (Telemedicine).  Patients are able to view lab/test results, encounter notes, upcoming appointments, etc.  Non-urgent messages can be sent to your provider as well.   ?To learn more about what you can do with MyChart, go to [email or web address].   ? ?Your next appointment:   ?1 year(s) ? ?The format for  your next appointment:   ?In Person ? ?Provider:   ?Peter Nishan, MD { ? ?

## 2022-03-26 ENCOUNTER — Other Ambulatory Visit: Payer: Self-pay | Admitting: Cardiovascular Disease

## 2022-03-28 ENCOUNTER — Other Ambulatory Visit: Payer: Self-pay | Admitting: Cardiovascular Disease

## 2022-04-05 ENCOUNTER — Ambulatory Visit (HOSPITAL_COMMUNITY): Payer: Medicare Other | Attending: Cardiology

## 2022-04-05 DIAGNOSIS — R5383 Other fatigue: Secondary | ICD-10-CM

## 2022-04-05 DIAGNOSIS — I25810 Atherosclerosis of coronary artery bypass graft(s) without angina pectoris: Secondary | ICD-10-CM | POA: Diagnosis not present

## 2022-04-05 DIAGNOSIS — I420 Dilated cardiomyopathy: Secondary | ICD-10-CM | POA: Diagnosis not present

## 2022-04-05 DIAGNOSIS — I10 Essential (primary) hypertension: Secondary | ICD-10-CM

## 2022-04-05 LAB — ECHOCARDIOGRAM COMPLETE: Area-P 1/2: 3.38 cm2

## 2022-04-09 ENCOUNTER — Encounter (INDEPENDENT_AMBULATORY_CARE_PROVIDER_SITE_OTHER): Payer: Medicare Other | Admitting: Ophthalmology

## 2022-04-09 DIAGNOSIS — H353112 Nonexudative age-related macular degeneration, right eye, intermediate dry stage: Secondary | ICD-10-CM | POA: Diagnosis not present

## 2022-04-09 DIAGNOSIS — H353221 Exudative age-related macular degeneration, left eye, with active choroidal neovascularization: Secondary | ICD-10-CM | POA: Diagnosis not present

## 2022-04-09 DIAGNOSIS — I10 Essential (primary) hypertension: Secondary | ICD-10-CM

## 2022-04-09 DIAGNOSIS — H35033 Hypertensive retinopathy, bilateral: Secondary | ICD-10-CM

## 2022-04-09 DIAGNOSIS — H43813 Vitreous degeneration, bilateral: Secondary | ICD-10-CM

## 2022-05-02 DIAGNOSIS — E7800 Pure hypercholesterolemia, unspecified: Secondary | ICD-10-CM | POA: Diagnosis not present

## 2022-05-02 DIAGNOSIS — K219 Gastro-esophageal reflux disease without esophagitis: Secondary | ICD-10-CM | POA: Diagnosis not present

## 2022-05-02 DIAGNOSIS — J45909 Unspecified asthma, uncomplicated: Secondary | ICD-10-CM | POA: Diagnosis not present

## 2022-05-02 DIAGNOSIS — K5100 Ulcerative (chronic) pancolitis without complications: Secondary | ICD-10-CM | POA: Diagnosis not present

## 2022-05-02 DIAGNOSIS — I2510 Atherosclerotic heart disease of native coronary artery without angina pectoris: Secondary | ICD-10-CM | POA: Diagnosis not present

## 2022-05-02 DIAGNOSIS — F341 Dysthymic disorder: Secondary | ICD-10-CM | POA: Diagnosis not present

## 2022-05-02 DIAGNOSIS — I10 Essential (primary) hypertension: Secondary | ICD-10-CM | POA: Diagnosis not present

## 2022-05-07 DIAGNOSIS — Z23 Encounter for immunization: Secondary | ICD-10-CM | POA: Diagnosis not present

## 2022-05-22 ENCOUNTER — Encounter (INDEPENDENT_AMBULATORY_CARE_PROVIDER_SITE_OTHER): Payer: Medicare Other | Admitting: Ophthalmology

## 2022-05-22 DIAGNOSIS — H43813 Vitreous degeneration, bilateral: Secondary | ICD-10-CM

## 2022-05-22 DIAGNOSIS — I10 Essential (primary) hypertension: Secondary | ICD-10-CM

## 2022-05-22 DIAGNOSIS — H353112 Nonexudative age-related macular degeneration, right eye, intermediate dry stage: Secondary | ICD-10-CM | POA: Diagnosis not present

## 2022-05-22 DIAGNOSIS — H353221 Exudative age-related macular degeneration, left eye, with active choroidal neovascularization: Secondary | ICD-10-CM | POA: Diagnosis not present

## 2022-05-22 DIAGNOSIS — H35371 Puckering of macula, right eye: Secondary | ICD-10-CM | POA: Diagnosis not present

## 2022-05-22 DIAGNOSIS — H35033 Hypertensive retinopathy, bilateral: Secondary | ICD-10-CM

## 2022-05-29 ENCOUNTER — Other Ambulatory Visit: Payer: Self-pay | Admitting: Cardiovascular Disease

## 2022-06-02 ENCOUNTER — Other Ambulatory Visit: Payer: Self-pay | Admitting: Internal Medicine

## 2022-06-22 ENCOUNTER — Other Ambulatory Visit: Payer: Self-pay | Admitting: Internal Medicine

## 2022-06-25 ENCOUNTER — Encounter (HOSPITAL_COMMUNITY): Payer: Self-pay | Admitting: Emergency Medicine

## 2022-06-25 ENCOUNTER — Emergency Department (HOSPITAL_COMMUNITY)
Admission: EM | Admit: 2022-06-25 | Discharge: 2022-06-25 | Disposition: A | Discharge status: Home / Self Care | Payer: Medicare Other | Attending: Emergency Medicine | Admitting: Emergency Medicine

## 2022-06-25 ENCOUNTER — Other Ambulatory Visit: Payer: Self-pay

## 2022-06-25 ENCOUNTER — Emergency Department (HOSPITAL_COMMUNITY): Payer: Medicare Other

## 2022-06-25 DIAGNOSIS — I2510 Atherosclerotic heart disease of native coronary artery without angina pectoris: Secondary | ICD-10-CM | POA: Insufficient documentation

## 2022-06-25 DIAGNOSIS — I10 Essential (primary) hypertension: Secondary | ICD-10-CM | POA: Insufficient documentation

## 2022-06-25 DIAGNOSIS — Z7982 Long term (current) use of aspirin: Secondary | ICD-10-CM | POA: Diagnosis not present

## 2022-06-25 DIAGNOSIS — Z7951 Long term (current) use of inhaled steroids: Secondary | ICD-10-CM | POA: Diagnosis not present

## 2022-06-25 DIAGNOSIS — M7989 Other specified soft tissue disorders: Secondary | ICD-10-CM | POA: Diagnosis not present

## 2022-06-25 DIAGNOSIS — Z79899 Other long term (current) drug therapy: Secondary | ICD-10-CM | POA: Insufficient documentation

## 2022-06-25 DIAGNOSIS — J45909 Unspecified asthma, uncomplicated: Secondary | ICD-10-CM | POA: Insufficient documentation

## 2022-06-25 DIAGNOSIS — M25562 Pain in left knee: Secondary | ICD-10-CM | POA: Insufficient documentation

## 2022-06-25 DIAGNOSIS — M25561 Pain in right knee: Secondary | ICD-10-CM | POA: Diagnosis not present

## 2022-06-25 DIAGNOSIS — M1711 Unilateral primary osteoarthritis, right knee: Secondary | ICD-10-CM | POA: Diagnosis not present

## 2022-06-25 DIAGNOSIS — R609 Edema, unspecified: Secondary | ICD-10-CM | POA: Diagnosis not present

## 2022-06-25 MED ORDER — MELOXICAM 15 MG PO TABS: 15.0000 mg | ORAL_TABLET | Freq: Every day | ORAL | 0 refills | Status: AC

## 2022-06-25 NOTE — Discharge Instructions (Addendum)
You were seen today for knee pain. Your x-rays show advanced arthritis in both knees which is likely causing your pain. I have prescribed an anti-inflammatory medication to be taken daily. Ice your knees at least once daily for 20 minutes at a time. You may also take Tylenol for pain. Follow up with Dr.Swinteck, orthopedic surgeon. I have provided his contact information.

## 2022-06-25 NOTE — ED Provider Notes (Signed)
[name]   CSN: [id number] Arrival date & time: 06/25/22  0932     History  Chief Complaint  Patient presents with   Knee Pain    Barry [name] is a 76 y.o. male.  Patient presents to hospital complaining of bilateral knee pain for the past 3 days.  Patient states that his knees have always  "hurts" but over the past 3 days pain is increased.  He noticed swelling of bilateral knee starting yesterday.  The patient denies any recent falls or trauma to the knee.  The patient denies any recent illnesses or fevers.  Past medical history significant for degenerative disc disease, obesity, hypertension, CAD, asthma, UC, osteoarthritis, A-fib, GERD  HPI     Home Medications Prior to Admission medications   Medication Sig Start Date End Date Taking? Authorizing Provider  meloxicam (MOBIC) 15 MG tablet Take 1 tablet (15 mg total) by mouth daily. 06/25/22 07/25/22 Yes Dorothyann Peng, PA-C  acetaminophen (TYLENOL) 500 MG tablet Take per bottle as needed for arthritis    [provider]  aspirin 81 MG tablet Take 81 mg by mouth daily.    [provider]  atorvastatin (LIPITOR) 40 MG tablet Take 1 tablet (40 mg total) by mouth daily. 05/30/22   Josue Hector, MD  azithromycin (ZITHROMAX) 250 MG tablet Take 2 tablets on first day, then 1 tablet daily until finished. Patient not taking: Reported on 03/21/2022 12/19/21   Tanda Rockers, MD  betamethasone dipropionate 0.05 % lotion As directed 01/30/21   [provider]  budesonide-formoterol (SYMBICORT) 160-4.5 MCG/ACT inhaler TAKE 2 PUFFS BY MOUTH TWICE A DAY 09/18/21   Tanda Rockers, MD  carboxymethylcellulose (REFRESH PLUS) 0.5 % SOLN Use as needed for dry eye    [provider]  Cholecalciferol (VITAMIN D) 50 MCG (2000 UT) tablet Take 2,000 Units by mouth daily.     [provider]  COVID-19 mRNA bivalent vaccine, Pfizer, (PFIZER COVID-19 VAC BIVALENT)  injection Inject into the muscle. Patient not taking: Reported on 03/21/2022 09/28/21   Carlyle Basques, MD  cycloSPORINE (RESTASIS) 0.05 % ophthalmic emulsion Place 1 drop into both eyes 2 (two) times daily.    [provider]  diltiazem (CARDIZEM CD) 240 MG 24 hr capsule TAKE 1 CAPSULE BY MOUTH EVERY DAY 03/26/22   Josue Hector, MD  docusate sodium (COLACE) 100 MG capsule Take 100 mg by mouth daily as needed for mild constipation.    [provider]  Eyelid Cleansers (OCUSOFT EYELID CLEANSING) PADS Apply topically. Use daily    [provider]  ezetimibe (ZETIA) 10 MG tablet TAKE 1 TABLET BY MOUTH EVERY DAY 03/28/22   Josue Hector, MD  fexofenadine (ALLEGRA) 180 MG tablet Take 180 mg by mouth daily.    [provider]  fluticasone (FLONASE) 50 MCG/ACT nasal spray Place 2 sprays into both nostrils 2 (two) times daily.     [provider]  latanoprost (XALATAN) 0.005 % ophthalmic solution Place 1 drop into both eyes at bedtime.    [provider]  metoprolol tartrate (LOPRESSOR) 25 MG tablet TAKE 1 TABLET BY MOUTH TWICE A DAY 12/27/21   Josue Hector, MD  montelukast (SINGULAIR) 10 MG tablet ONE AT BEDTIME EVERY NIGHT 06/22/22   Tanda Rockers, MD  Multiple Vitamins-Minerals (CENTRUM SILVER PO) Take 1 tablet by mouth daily.    [provider]  nitroGLYCERIN (NITROSTAT) 0.4 MG SL tablet Place  1 tablet (0.4 mg total) under the tongue every 5 (five) minutes as needed for chest pain. 01/12/21   Josue Hector, MD  omeprazole (PRILOSEC) 20 MG capsule Take 20 mg by mouth 2 (two) times daily before a meal.    [provider]  Polyethyl Glyc-Propyl Glyc PF 0.4-0.3 % SOLN Apply 1 drop to eye as needed.    [provider]  predniSONE (DELTASONE) 10 MG tablet Take 4 tabs x 2 days, 2 tabs x 2 days, then 1 tab x 2 days and stop. Patient not taking: Reported on 03/21/2022 12/19/21   Tanda Rockers, MD  Pseudoephedrine-Ibuprofen  30-200 MG TABS Per bottle as needed for sinus pressure/pain    [provider]  Spacer/Aero-Holding Chambers (AEROCHAMBER MV) inhaler Use as instructed 09/18/13   Parrett, Fonnie Mu, NP  sulfaSALAzine (AZULFIDINE) 500 MG tablet Take 1,000 mg by mouth 4 (four) times daily.    [provider]  VENTOLIN HFA 108 (90 Base) MCG/ACT inhaler INHALE 2 PUFFS INTO THE LUNGS EVERY 4 HRS AS NEEDED FOR WHEEZING OR SHORTNESS OF BREATH 03/19/19   Parrett, Fonnie Mu, NP      Allergies    Piroxicam    Review of Systems   Review of Systems  Constitutional:  Negative for fever.  Musculoskeletal:  Positive for arthralgias and joint swelling.    Physical Exam Updated Vital Signs BP (!) 151/58 (BP Location: Left Arm)   Pulse 87   Temp 98 F (36.7 C) (Oral)   Resp 18   Ht '5\' 3"'$  (1.6 m)   Wt 90.7 kg   SpO2 98%   BMI 35.43 kg/m  Physical Exam Vitals and nursing note reviewed.  Constitutional:      General: He is not in acute distress. HENT:     Head: Normocephalic and atraumatic.  Eyes:     Conjunctiva/sclera: Conjunctivae normal.  Cardiovascular:     Rate and Rhythm: Normal rate and regular rhythm.     Pulses: Normal pulses.  Pulmonary:     Effort: Pulmonary effort is normal.     Breath sounds: Normal breath sounds.  Musculoskeletal:     Cervical back: Normal range of motion.     Comments: Minimal swelling noted to bilateral knees.  No warmth or erythema noted.  Patient complains of pain with passive range of motion of bilateral knees.  No significant tenderness to palpation  Skin:    General: Skin is warm and dry.  Neurological:     Mental Status: He is alert.     ED Results / Procedures / Treatments   Labs (all labs ordered are listed, but only abnormal results are displayed) Labs Reviewed - No data to display  EKG None  Radiology DG Knee Complete 4 Views Left  Result Date: 06/25/2022 CLINICAL DATA:  Knee pain EXAM: LEFT KNEE - COMPLETE 4+ VIEW COMPARISON:  None  Available. FINDINGS: Complete loss of joint space in the lateral compartment with sclerosis and osteophytosis of the articular surface. Angulation of the femur and tibia related to the asymmetric joint space loss. No acute fracture dislocation. No joint effusion. IMPRESSION: Severe osteoarthritis of the lateral compartment with complete loss of the joint space. Electronically Signed   By: Suzy Bouchard M.D.   On: 06/25/2022 08:00   DG Knee Complete 4 Views Right  Result Date: 06/25/2022 CLINICAL DATA:  76 year old male with history of bilateral knee pain and swelling. EXAM: RIGHT KNEE - COMPLETE 4+ VIEW COMPARISON:  No priors. FINDINGS:  Four views of the right knee demonstrate no definite acute displaced fracture. There is severe joint space narrowing, subchondral sclerosis, subchondral cyst formation and osteophyte formation, most pronounced in the lateral compartment, indicative of advanced osteoarthritis. This is associated with some valgus angulation at the knee joint. IMPRESSION: 1. Advanced tricompartmental osteoarthritis, most severe in the lateral compartment, as above. Electronically Signed   By: Vinnie Langton M.D.   On: 06/25/2022 07:57    Procedures Procedures    Medications Ordered in ED Medications - No data to display  ED Course/ Medical Decision Making/ A&P                           Medical Decision Making Amount and/or Complexity of Data Reviewed Radiology: ordered.   The patient presents with a chief complaint of bilateral knee pain.  Differential includes but is not limited to osteoarthritis, fracture, dislocation, septic arthritis, gout  The patient has no fever.  His joints do not feel warm and there is no erythema.  Very low clinical suspicion of a bilateral septic arthritis at this time.  Also feel that bilateral gout is unlikely based on presentation.  I ordered and interpreted imaging including plain films of bilateral knees. End-stage arthritic changes noted in  bilateral knees, worse on lateral side bilaterally.  No acute fracture or dislocation noted.  I agree with the radiologist findings  The patient's pain is likely from his advanced osteoarthritis.  I will refer him to orthopedic surgery for further evaluation.  Plan to prescribe home with prescription for meloxicam to help with inflammation.  He may take Tylenol as needed.  Patient may also ice knees as needed        Final Clinical Impression(s) / ED Diagnoses Final diagnoses:  Pain in both knees, unspecified chronicity    Rx / DC Orders ED Discharge Orders          Ordered    meloxicam (MOBIC) 15 MG tablet  Daily        06/25/22 0815              Dorothyann Peng, PA-C 06/25/22 0817    Valarie Merino, MD 06/29/22 [phone]

## 2022-06-25 NOTE — ED Triage Notes (Signed)
  Patient BIB EMS for bilateral knee pain and swelling that started a few days ago.  Patient states the pain and swelling is so bad that he can barely ambulate and take care of himself.  Patient has been using topical cream for pain but states it doesn't work.  Patient A&O x4.  Pain 10/10, sharp/achy.

## 2022-07-09 ENCOUNTER — Encounter (INDEPENDENT_AMBULATORY_CARE_PROVIDER_SITE_OTHER): Payer: Medicare Other | Admitting: Ophthalmology

## 2022-07-09 DIAGNOSIS — H353221 Exudative age-related macular degeneration, left eye, with active choroidal neovascularization: Secondary | ICD-10-CM | POA: Diagnosis not present

## 2022-07-09 DIAGNOSIS — H35033 Hypertensive retinopathy, bilateral: Secondary | ICD-10-CM

## 2022-07-09 DIAGNOSIS — H353112 Nonexudative age-related macular degeneration, right eye, intermediate dry stage: Secondary | ICD-10-CM

## 2022-07-09 DIAGNOSIS — I10 Essential (primary) hypertension: Secondary | ICD-10-CM

## 2022-07-09 DIAGNOSIS — H43813 Vitreous degeneration, bilateral: Secondary | ICD-10-CM

## 2022-07-24 DIAGNOSIS — M25562 Pain in left knee: Secondary | ICD-10-CM | POA: Diagnosis not present

## 2022-07-24 DIAGNOSIS — M25561 Pain in right knee: Secondary | ICD-10-CM | POA: Diagnosis not present

## 2022-07-24 DIAGNOSIS — M170 Bilateral primary osteoarthritis of knee: Secondary | ICD-10-CM | POA: Diagnosis not present

## 2022-07-25 ENCOUNTER — Telehealth: Payer: Self-pay | Admitting: *Deleted

## 2022-07-25 NOTE — Telephone Encounter (Signed)
   Pre-operative Risk Assessment    Patient Name: Barry [name]  DOB: Jan 03, 1946 MRN: [id number]      Request for Surgical Clearance    Procedure:   RIGHT TOTAL KNEE ARTHROPLASTY  Date of Surgery:  Clearance TBD                                 Surgeon:  DR. Rod Can Surgeon's Group or Practice Name:  [facility] Phone number:  [phone]  Fax number:  [phone] ATTN: KERRI MAZE   Type of Clearance Requested:   - Medical ; ASA    Type of Anesthesia:  Spinal   Additional requests/questions:    Jiles Prows   07/25/2022, 1:56 PM

## 2022-07-26 NOTE — Telephone Encounter (Signed)
Covering preop today. Last OV 03/21/22. Patient thad CAD with distant CABG 2001. Having fatigue at last visit. 2D echo 04/05/22 EF 50-55% + WMA, mild LVH, mild AI. (The inferior wall and basal inferolateral segment are hypokinetic. The entire anterior wall, antero-lateral wall, mid and distal lateral wall, entire septum, and entire apex are normal.) There is a statement saying no change from prior in 2020 though 2020 study did not show any wall motion changes. Preop team will plan to do pre-op virtual visit, but will route to Dr. Johnsie Cancel to ensure he did not feel that wall motion abnormalities were of concern and to see if he is OK with patient holding ASA for hip surgery. Dr. Johnsie Cancel - Please route response to P CV DIV PREOP (the pre-op pool). Thank you.  (Pt falls outside pre-op PCI protocol since has not had PCI.)

## 2022-07-31 NOTE — Telephone Encounter (Signed)
I s/w the pt and he is agreeable to come in the office for pre op appt and to schedule Lexiscan per Dr. Johnsie Cancel. Pt thanked me for the call and the help. Pt has appt 08/06/22 @ 1:55 with Nicholes Rough, NP

## 2022-08-05 NOTE — Progress Notes (Addendum)
Office Visit    Patient Name: Barry [name] Date of Encounter: 08/06/2022  PCP:  [name], [facility]  Cardiologist:  Jenkins Rouge, MD  Advanced Practice Provider:  No care team member to display Electrophysiologist:  None   HPI    Barry [name] is a 76 y.o. male with a past medical history significant for atrial fibrillation, CAD status post CABG 2001, asthma, Hypertension, Hyperlipidemia presents today for preop clearance.  He was a previous patient of Dr. Wynonia Lawman.  He sees pulmonary for paralyzed left hemidiaphragm.  He has a history of CAD with a CABG back in August 2001.  He has UC and requires sulfasalazine and steroids.  He uses a walker for some lower extremity orthopedic issues.  He was last seen 03/2022 and was having no angina but some fatigue and dyspnea.  Echocardiogram 10/2019 was done for fatigue showed EF 50 to 55% with no pulmonary hypertension and AV sclerosis.  His caretaker/friend Margaretha Sheffield asked if he could have orthopedic surgery.  Today, he presents for preop clearance.  Today, he presents for preop clearance for a right knee replacement.  He states his left knee also needs to be done but it will be done at the same time.  Due to his surgery being back in 2001 it was recommended that he gets a [employer] prior to surgery.  There was confusion today and his caretaker Margaretha Sheffield thought that he was going to get this done today.  Unfortunately, we will need to have his insurance authorize the testing and will not be able to do it today.  After questioning, he scored a 4.06 on the DASI.  He does walk indoors but is unable to walk 2 blocks because of his knee.  He does do some light housework like dusting.  He is also able to take his garbage out to the dumpster using his walker.  Most of his movement is limited by his knee.  He has not had any chest pain, shortness of breath, or palpitations.  Of note he is not taking aspirin 81 mg because he  is taking Mobic.  He was told to discontinue his aspirin.  Reports no shortness of breath nor dyspnea on exertion. Reports no chest pain, pressure, or tightness. No edema, orthopnea, PND. Reports no palpitations.   Past Medical History    Past Medical History:  Diagnosis Date   A-fib [facility])    Acute sinusitis 03/24/2012   Followed in Pulmonary clinic/ [facility]/ [name] - CT sinus 08/07/2013  Short air-fluid levels in the maxillary sinuses bilaterally suggesting early acute sinusitis. - repeat ct sinus 04/30/2014 > ok     Acute sinusitis, unspecified 08/14/2010   Qualifier: Diagnosis of  By: [name], [name]     Asthma    PFTs 06/15/05 FEV1 85% predicted ratio 68% and truncation of resp loop in a sawtooth pattern. HFA 25% 08-01-2009 >50% Nov 01, 2009>50% 01-31-2010   Asthma, chronic 11/24/2007   Followed in Pulmonary clinic/ [facility]/ [name]  - PFTs 06/15/2005 FEV1 85% predicted ratio 68% and truncation of respiratory loop in a sawtooth pattern  - HFA 25% August 01, 2009 > 50% November 01, 2009 > 50% January 31, 2010 therefore changed to neb bud/brovana - HFA 75% p extensive coaching 08/07/2013  > insurance issues so try symbicort 160 2 bid instead of neb - 11/16/2015   p extensive   BRONCHITIS, ACUTE 01/22/2011   Qualifier: Diagnosis of  ByRoyal Piedra NP, [name]     CAD (coronary artery disease), native coronary artery 02/06/2019   S/p CABG 9211 complicated by paralyzed left hemidiaphram   Cataract    Chronic cough    Chronic rhinitis 05/02/2009   Followed in Pulmonary clinic/ [facility]/ [name]    - 03/24/12 CT Sinus > Negative paranasal sinuses - Sinus CT 08/07/2013 >>Short air-fluid levels in the maxillary sinuses bilaterally suggesting early acute sinusitis - Repeat augmentin x 21 days 03/27/2014 then sinus ct if not better  - flonase/afrin regimen 02/11/15 >>>      Coronary heart disease    COUGH, CHRONIC 11/24/2007   Followed in Pulmonary clinic/ [facility]/ [name]      DDD  (degenerative disc disease), lumbar    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   Followed in Pulmonary clinic/ [facility]/ [name]  -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     Excessive ear wax, bilateral 09/15/2018   GASTROESOPHAGEAL REFLUX DISEASE 08/27/2008   Followed as Primary Care Patient/ GI/ Dr  Earle Gell     Glaucoma    Hyperlipidemia LDL goal <70 02/06/2019   Hypertension    Obesity    Osteoarthritis    Paralyzed hemidiaphragm    left, 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   Retina disorder 2014   Severe obesity (BMI >= 40) ([place]) 11/24/2007       - Target wt < 191 to get under BMI 30    Ulcerative colitis    ULCERATIVE COLITIS 11/24/2007   Followed as Primary Care Patient/ GI/ Dr  Earle Gell     URI 04/09/2008   Qualifier: Diagnosis of  By: [name], [name]     Past Surgical History:  Procedure Laterality Date   CARDIAC CATHETERIZATION Left    COLONOSCOPY WITH PROPOFOL N/A 11/29/2015   Procedure: COLONOSCOPY WITH PROPOFOL;  Surgeon: [name], MD;  Location: [facility] END[facility]COPY;  Service: Endoscopy;  Laterality: N/A;   CORONARY ARTERY BYPASS GRAFT     x6    Allergies  Allergies  Allergen Reactions   Piroxicam Itching and Rash     EKGs/Labs/Other Studies Reviewed:   The following studies were reviewed today:  Echocardiogram 04/05/22 IMPRESSIONS     1. Left ventricular ejection fraction, by estimation, is 50 to 55%. The  left ventricle has low normal function. The left ventricle demonstrates  regional wall motion abnormalities (see scoring diagram/findings for  description). There is mild concentric  left ventricular hypertrophy. Left ventricular diastolic parameters are  indeterminate.   2. Right ventricular systolic function is normal. The right ventricular  size is normal. Tricuspid regurgitation signal is inadequate for assessing  PA pressure.   3. The mitral valve is normal in structure. Trivial mitral valve  regurgitation. No evidence of mitral stenosis.    4. The aortic valve is normal in structure. Aortic valve regurgitation is  mild. Aortic valve sclerosis is present, with no evidence of aortic valve  stenosis.   5. The inferior vena cava is normal in size with greater than 50%  respiratory variability, suggesting right atrial pressure of 3 mmHg.   Comparison(s): A prior study was performed on 11/06/2019. No significant  change from prior study.   EKG:  EKG is  ordered today.  The ekg ordered today demonstrates normal sinus rhythm, rate 68 bpm  Recent Labs: No results found for requested labs within last 365 days.  Recent Lipid Panel No results found for: "CHOL", "TRIG", "HDL", "CHOLHDL", "VLDL", "LDLCALC", "LDLDIRECT"  Home Medications   Current  Meds  Medication Sig   acetaminophen (TYLENOL) 500 MG tablet Take per bottle as needed for arthritis   aspirin 81 MG tablet Take 81 mg by mouth daily.   atorvastatin (LIPITOR) 40 MG tablet Take 1 tablet (40 mg total) by mouth daily.   betamethasone dipropionate 0.05 % lotion As directed   budesonide-formoterol (SYMBICORT) 160-4.5 MCG/ACT inhaler TAKE 2 PUFFS BY MOUTH TWICE A DAY   carboxymethylcellulose (REFRESH PLUS) 0.5 % SOLN Use as needed for dry eye   Cholecalciferol (VITAMIN D) 50 MCG (2000 UT) tablet Take 2,000 Units by mouth daily.    cycloSPORINE (RESTASIS) 0.05 % ophthalmic emulsion Place 1 drop into both eyes 2 (two) times daily.   diltiazem (CARDIZEM CD) 240 MG 24 hr capsule TAKE 1 CAPSULE BY MOUTH EVERY DAY   docusate sodium (COLACE) 100 MG capsule Take 100 mg by mouth daily as needed for mild constipation.   escitalopram (LEXAPRO) 10 MG tablet Take by mouth daily at 6 (six) AM.   Eyelid Cleansers (OCUSOFT EYELID CLEANSING) PADS Apply topically. Use daily   ezetimibe (ZETIA) 10 MG tablet TAKE 1 TABLET BY MOUTH EVERY DAY   fexofenadine (ALLEGRA) 180 MG tablet Take 180 mg by mouth as needed for allergies.   fluticasone (FLONASE) 50 MCG/ACT nasal spray Place 2 sprays into both  nostrils 2 (two) times daily.    latanoprost (XALATAN) 0.005 % ophthalmic solution Place 1 drop into both eyes at bedtime.   losartan (COZAAR) 25 MG tablet Take 25 mg by mouth daily.   meloxicam (MOBIC) 15 MG tablet Take 15 mg by mouth daily.   metoprolol tartrate (LOPRESSOR) 25 MG tablet TAKE 1 TABLET BY MOUTH TWICE A DAY   montelukast (SINGULAIR) 10 MG tablet ONE AT BEDTIME EVERY NIGHT   Multiple Vitamins-Minerals (CENTRUM SILVER PO) Take 1 tablet by mouth daily.   nitroGLYCERIN (NITROSTAT) 0.4 MG SL tablet Place 1 tablet (0.4 mg total) under the tongue every 5 (five) minutes as needed for chest pain.   omeprazole (PRILOSEC) 20 MG capsule Take 20 mg by mouth 2 (two) times daily before a meal.   Polyethyl Glyc-Propyl Glyc PF 0.4-0.3 % SOLN Apply 1 drop to eye as needed.   Pseudoephedrine-Ibuprofen 30-200 MG TABS Per bottle as needed for sinus pressure/pain   Spacer/Aero-Holding Chambers (AEROCHAMBER MV) inhaler Use as instructed   sulfaSALAzine (AZULFIDINE) 500 MG tablet Take 1,000 mg by mouth 4 (four) times daily.   VENTOLIN HFA 108 (90 Base) MCG/ACT inhaler INHALE 2 PUFFS INTO THE LUNGS EVERY 4 HRS AS NEEDED FOR WHEEZING OR SHORTNESS OF BREATH   [DISCONTINUED] predniSONE (DELTASONE) 10 MG tablet Take 4 tabs x 2 days, 2 tabs x 2 days, then 1 tab x 2 days and stop.     Review of Systems      All other systems reviewed and are otherwise negative except as noted above.  Physical Exam    VS:  BP 130/66   Pulse 68   Ht '5\' 3"'$  (1.6 m)   Wt 205 lb (93 kg)   SpO2 95%   BMI 36.31 kg/m  , BMI Body mass index is 36.31 kg/m.  Wt Readings from Last 3 Encounters:  08/06/22 205 lb (93 kg)  06/25/22 200 lb (90.7 kg)  03/21/22 209 lb (94.8 kg)     GEN: Well nourished, well developed, in no acute distress. HEENT: normal. Neck: Supple, no JVD, carotid bruits, or masses. Cardiac: RRR, no murmurs, rubs, or gallops. No clubbing, cyanosis, edema.  Radials/PT 2+  and equal bilaterally.   Respiratory:  Respirations regular and unlabored, clear to auscultation bilaterally. GI: Soft, nontender, nondistended. MS: No deformity or atrophy. Skin: Warm and dry, no rash. Neuro:  Strength and sensation are intact. Psych: Normal affect.  Assessment & Plan    Preop Clearance  Mr. Galindo perioperative risk of a major cardiac event is 0.9% according to the Revised Cardiac Risk Index (RCRI).  Therefore, he is at low risk for perioperative complications.   His functional capacity is fair at 4.06 METs according to the [facility] Activity Status Index (DASI). Recommendations: Lexiscan Myoview is negative. He is okay to go forward with upcoming procedure.   Antiplatelet and/or Anticoagulation Recommendations: He is not taking ASA.  2. CAD/CABG 2001 -he is not taking his ASA because his primary told him to stop it while taking mobic -Continue atorvastatin 40 mg daily, Cardizem 240 mg daily, Zetia 10 mg daily, losartan 25 mg daily, metoprolol 25 mg twice a day  3. Hypertension -Well-controlled today 130/66 -Continue current medication regimen  4. Hyperlipidemia -He will need an updated Lipid panel when he is next in the office  5. Fatigue -this is unchanged         Disposition: Follow up 8 months with Jenkins Rouge, MD or APP.  Signed, Elgie Collard, PA-C 08/06/2022, 4:00 PM [facility]

## 2022-08-06 ENCOUNTER — Encounter: Payer: Self-pay | Admitting: *Deleted

## 2022-08-06 ENCOUNTER — Ambulatory Visit (INDEPENDENT_AMBULATORY_CARE_PROVIDER_SITE_OTHER): Payer: Medicare Other | Admitting: Physician Assistant

## 2022-08-06 ENCOUNTER — Encounter: Payer: Self-pay | Admitting: Physician Assistant

## 2022-08-06 VITALS — BP 130/66 | HR 68 | Ht 63.0 in | Wt 205.0 lb

## 2022-08-06 DIAGNOSIS — I10 Essential (primary) hypertension: Secondary | ICD-10-CM | POA: Diagnosis not present

## 2022-08-06 DIAGNOSIS — R5383 Other fatigue: Secondary | ICD-10-CM | POA: Diagnosis not present

## 2022-08-06 DIAGNOSIS — K5120 Ulcerative (chronic) proctitis without complications: Secondary | ICD-10-CM

## 2022-08-06 DIAGNOSIS — E785 Hyperlipidemia, unspecified: Secondary | ICD-10-CM | POA: Diagnosis not present

## 2022-08-06 DIAGNOSIS — I25810 Atherosclerosis of coronary artery bypass graft(s) without angina pectoris: Secondary | ICD-10-CM | POA: Diagnosis not present

## 2022-08-06 NOTE — Patient Instructions (Signed)
Medication Instructions:  Your physician recommends that you continue on your current medications as directed. Please refer to the Current Medication list given to you today.  *If you need a refill on your cardiac medications before your next appointment, please call your pharmacy*   Lab Work: None If you have labs (blood work) drawn today and your tests are completely normal, you will receive your results only by: [place] (if you have MyChart) OR A paper copy in the mail If you have any lab test that is abnormal or we need to change your treatment, we will call you to review the results.   Testing/Procedures: Your physician has requested that you have a lexiscan myoview.Please follow instruction sheet, as given.    Follow-Up: At [facility], you and your health needs are our priority.  As part of our continuing mission to provide you with exceptional heart care, we have created designated Provider Care Teams.  These Care Teams include your primary Cardiologist (physician) and Advanced Practice Providers (APPs -  Physician Assistants and Nurse Practitioners) who all work together to provide you with the care you need, when you need it.  We recommend signing up for the patient portal called "MyChart".  Sign up information is provided on this After Visit Summary.  MyChart is used to connect with patients for Virtual Visits (Telemedicine).  Patients are able to view lab/test results, encounter notes, upcoming appointments, etc.  Non-urgent messages can be sent to your provider as well.   To learn more about what you can do with MyChart, go to [email or web address].    Your next appointment:   8 month(s)  The format for your next appointment:   In Person  Provider:   Jenkins Rouge, MD {  Important Information About Sugar

## 2022-08-06 NOTE — Addendum Note (Signed)
Addended by: Juventino Slovak on: 08/06/2022 04:10 PM   Modules accepted: Orders

## 2022-08-06 NOTE — Telephone Encounter (Signed)
Covering preop today. As per previous notes, patient has appt with PA Tessa today for pre-op evaluation and testing. Johann Capers, can you please forward copy of your note to requesting surgeon? Otherwise we will remove this message from preop box. Thank you!

## 2022-08-07 ENCOUNTER — Telehealth (HOSPITAL_COMMUNITY): Payer: Self-pay | Admitting: *Deleted

## 2022-08-07 NOTE — Addendum Note (Signed)
Addended by: Janan Halter F on: 08/07/2022 04:43 PM   Modules accepted: Orders

## 2022-08-07 NOTE — Telephone Encounter (Signed)
Patient given detailed instructions per Myocardial Perfusion Study Information Sheet for the test on 08/13/2022 at 10:30. Patient notified to arrive 15 minutes early and that it is imperative to arrive on time for appointment to keep from having the test rescheduled.  If you need to cancel or reschedule your appointment, please call the office within 24 hours of your appointment. . Patient verbalized understanding.Barry [name]

## 2022-08-08 NOTE — Addendum Note (Signed)
Addended by: Elgie Collard on: 08/08/2022 01:03 PM   Modules accepted: Orders

## 2022-08-08 NOTE — Addendum Note (Signed)
Addended by: Janan Halter F on: 08/08/2022 12:19 PM   Modules accepted: Orders

## 2022-08-13 ENCOUNTER — Ambulatory Visit (HOSPITAL_COMMUNITY): Payer: Medicare Other | Attending: Cardiology

## 2022-08-13 DIAGNOSIS — I25810 Atherosclerosis of coronary artery bypass graft(s) without angina pectoris: Secondary | ICD-10-CM

## 2022-08-13 LAB — MYOCARDIAL PERFUSION IMAGING
LV dias vol: 105 mL (ref 62–150)
LV sys vol: 45 mL
Nuc Stress EF: 57 %
Peak HR: 92 {beats}/min
Rest HR: 66 {beats}/min
Rest Nuclear Isotope Dose: 10.2 mCi
SDS: 3
SRS: 2
SSS: 5
ST Depression (mm): 0 mm
Stress Nuclear Isotope Dose: 30.5 mCi
TID: 0.98

## 2022-08-13 MED ORDER — REGADENOSON 0.4 MG/5ML IV SOLN
0.4000 mg | Freq: Once | INTRAVENOUS | Status: AC
  Administered 2022-08-13: 0.4 mg via INTRAVENOUS

## 2022-08-13 MED ORDER — TECHNETIUM TC 99M TETROFOSMIN IV KIT
30.5000 | PACK | Freq: Once | INTRAVENOUS | Status: AC | PRN
  Administered 2022-08-13: 30.5 via INTRAVENOUS

## 2022-08-13 MED ORDER — TECHNETIUM TC 99M TETROFOSMIN IV KIT
10.2000 | PACK | Freq: Once | INTRAVENOUS | Status: AC | PRN
  Administered 2022-08-13: 10.2 via INTRAVENOUS

## 2022-08-27 ENCOUNTER — Encounter (INDEPENDENT_AMBULATORY_CARE_PROVIDER_SITE_OTHER): Payer: Medicare Other | Admitting: Ophthalmology

## 2022-08-27 DIAGNOSIS — I10 Essential (primary) hypertension: Secondary | ICD-10-CM | POA: Diagnosis not present

## 2022-08-27 DIAGNOSIS — H353112 Nonexudative age-related macular degeneration, right eye, intermediate dry stage: Secondary | ICD-10-CM

## 2022-08-27 DIAGNOSIS — H43813 Vitreous degeneration, bilateral: Secondary | ICD-10-CM

## 2022-08-27 DIAGNOSIS — H353221 Exudative age-related macular degeneration, left eye, with active choroidal neovascularization: Secondary | ICD-10-CM

## 2022-08-27 DIAGNOSIS — H35033 Hypertensive retinopathy, bilateral: Secondary | ICD-10-CM

## 2022-10-15 ENCOUNTER — Encounter (INDEPENDENT_AMBULATORY_CARE_PROVIDER_SITE_OTHER): Payer: Medicare Other | Admitting: Ophthalmology

## 2022-10-15 DIAGNOSIS — H353221 Exudative age-related macular degeneration, left eye, with active choroidal neovascularization: Secondary | ICD-10-CM | POA: Diagnosis not present

## 2022-10-15 DIAGNOSIS — H43813 Vitreous degeneration, bilateral: Secondary | ICD-10-CM | POA: Diagnosis not present

## 2022-10-15 DIAGNOSIS — I10 Essential (primary) hypertension: Secondary | ICD-10-CM | POA: Diagnosis not present

## 2022-10-15 DIAGNOSIS — H353112 Nonexudative age-related macular degeneration, right eye, intermediate dry stage: Secondary | ICD-10-CM | POA: Diagnosis not present

## 2022-10-15 DIAGNOSIS — H35033 Hypertensive retinopathy, bilateral: Secondary | ICD-10-CM

## 2022-10-25 DIAGNOSIS — K219 Gastro-esophageal reflux disease without esophagitis: Secondary | ICD-10-CM | POA: Diagnosis not present

## 2022-10-25 DIAGNOSIS — K5100 Ulcerative (chronic) pancolitis without complications: Secondary | ICD-10-CM | POA: Diagnosis not present

## 2022-10-25 DIAGNOSIS — Z86010 Personal history of colonic polyps: Secondary | ICD-10-CM | POA: Diagnosis not present

## 2022-11-07 DIAGNOSIS — R899 Unspecified abnormal finding in specimens from other organs, systems and tissues: Secondary | ICD-10-CM | POA: Diagnosis not present

## 2022-11-17 ENCOUNTER — Other Ambulatory Visit: Payer: Self-pay | Admitting: Internal Medicine

## 2022-11-22 DIAGNOSIS — J45909 Unspecified asthma, uncomplicated: Secondary | ICD-10-CM | POA: Diagnosis not present

## 2022-11-22 DIAGNOSIS — F341 Dysthymic disorder: Secondary | ICD-10-CM | POA: Diagnosis not present

## 2022-11-22 DIAGNOSIS — I252 Old myocardial infarction: Secondary | ICD-10-CM | POA: Diagnosis not present

## 2022-11-22 DIAGNOSIS — I499 Cardiac arrhythmia, unspecified: Secondary | ICD-10-CM | POA: Diagnosis not present

## 2022-11-22 DIAGNOSIS — I4891 Unspecified atrial fibrillation: Secondary | ICD-10-CM | POA: Diagnosis not present

## 2022-11-22 DIAGNOSIS — E7800 Pure hypercholesterolemia, unspecified: Secondary | ICD-10-CM | POA: Diagnosis not present

## 2022-11-22 DIAGNOSIS — M1990 Unspecified osteoarthritis, unspecified site: Secondary | ICD-10-CM | POA: Diagnosis not present

## 2022-11-22 DIAGNOSIS — I2510 Atherosclerotic heart disease of native coronary artery without angina pectoris: Secondary | ICD-10-CM | POA: Diagnosis not present

## 2022-11-22 DIAGNOSIS — I10 Essential (primary) hypertension: Secondary | ICD-10-CM | POA: Diagnosis not present

## 2022-11-22 DIAGNOSIS — Z0000 Encounter for general adult medical examination without abnormal findings: Secondary | ICD-10-CM | POA: Diagnosis not present

## 2022-11-22 DIAGNOSIS — K5100 Ulcerative (chronic) pancolitis without complications: Secondary | ICD-10-CM | POA: Diagnosis not present

## 2022-11-22 DIAGNOSIS — Z23 Encounter for immunization: Secondary | ICD-10-CM | POA: Diagnosis not present

## 2022-11-23 DIAGNOSIS — H353220 Exudative age-related macular degeneration, left eye, stage unspecified: Secondary | ICD-10-CM | POA: Diagnosis not present

## 2022-11-23 DIAGNOSIS — I2510 Atherosclerotic heart disease of native coronary artery without angina pectoris: Secondary | ICD-10-CM | POA: Diagnosis not present

## 2022-11-23 DIAGNOSIS — Z7951 Long term (current) use of inhaled steroids: Secondary | ICD-10-CM | POA: Diagnosis not present

## 2022-11-23 DIAGNOSIS — I4891 Unspecified atrial fibrillation: Secondary | ICD-10-CM | POA: Diagnosis not present

## 2022-11-23 DIAGNOSIS — E781 Pure hyperglyceridemia: Secondary | ICD-10-CM | POA: Diagnosis not present

## 2022-11-23 DIAGNOSIS — K5120 Ulcerative (chronic) proctitis without complications: Secondary | ICD-10-CM | POA: Diagnosis not present

## 2022-11-23 DIAGNOSIS — I252 Old myocardial infarction: Secondary | ICD-10-CM | POA: Diagnosis not present

## 2022-11-23 DIAGNOSIS — J42 Unspecified chronic bronchitis: Secondary | ICD-10-CM | POA: Diagnosis not present

## 2022-11-23 DIAGNOSIS — J45909 Unspecified asthma, uncomplicated: Secondary | ICD-10-CM | POA: Diagnosis not present

## 2022-11-23 DIAGNOSIS — Z6834 Body mass index (BMI) 34.0-34.9, adult: Secondary | ICD-10-CM | POA: Diagnosis not present

## 2022-11-23 DIAGNOSIS — E7800 Pure hypercholesterolemia, unspecified: Secondary | ICD-10-CM | POA: Diagnosis not present

## 2022-11-23 DIAGNOSIS — Z9181 History of falling: Secondary | ICD-10-CM | POA: Diagnosis not present

## 2022-11-23 DIAGNOSIS — K219 Gastro-esophageal reflux disease without esophagitis: Secondary | ICD-10-CM | POA: Diagnosis not present

## 2022-11-23 DIAGNOSIS — M1710 Unilateral primary osteoarthritis, unspecified knee: Secondary | ICD-10-CM | POA: Diagnosis not present

## 2022-11-23 DIAGNOSIS — M719 Bursopathy, unspecified: Secondary | ICD-10-CM | POA: Diagnosis not present

## 2022-11-23 DIAGNOSIS — I10 Essential (primary) hypertension: Secondary | ICD-10-CM | POA: Diagnosis not present

## 2022-11-23 DIAGNOSIS — F341 Dysthymic disorder: Secondary | ICD-10-CM | POA: Diagnosis not present

## 2022-11-26 DIAGNOSIS — I2510 Atherosclerotic heart disease of native coronary artery without angina pectoris: Secondary | ICD-10-CM | POA: Diagnosis not present

## 2022-11-26 DIAGNOSIS — E7800 Pure hypercholesterolemia, unspecified: Secondary | ICD-10-CM | POA: Diagnosis not present

## 2022-11-26 DIAGNOSIS — J45909 Unspecified asthma, uncomplicated: Secondary | ICD-10-CM | POA: Diagnosis not present

## 2022-11-26 DIAGNOSIS — I252 Old myocardial infarction: Secondary | ICD-10-CM | POA: Diagnosis not present

## 2022-11-26 DIAGNOSIS — I4891 Unspecified atrial fibrillation: Secondary | ICD-10-CM | POA: Diagnosis not present

## 2022-11-26 DIAGNOSIS — I10 Essential (primary) hypertension: Secondary | ICD-10-CM | POA: Diagnosis not present

## 2022-11-26 DIAGNOSIS — M170 Bilateral primary osteoarthritis of knee: Secondary | ICD-10-CM | POA: Diagnosis not present

## 2022-11-29 DIAGNOSIS — J45909 Unspecified asthma, uncomplicated: Secondary | ICD-10-CM | POA: Diagnosis not present

## 2022-11-29 DIAGNOSIS — I10 Essential (primary) hypertension: Secondary | ICD-10-CM | POA: Diagnosis not present

## 2022-11-29 DIAGNOSIS — I4891 Unspecified atrial fibrillation: Secondary | ICD-10-CM | POA: Diagnosis not present

## 2022-11-29 DIAGNOSIS — I252 Old myocardial infarction: Secondary | ICD-10-CM | POA: Diagnosis not present

## 2022-11-29 DIAGNOSIS — I2510 Atherosclerotic heart disease of native coronary artery without angina pectoris: Secondary | ICD-10-CM | POA: Diagnosis not present

## 2022-11-29 DIAGNOSIS — E7800 Pure hypercholesterolemia, unspecified: Secondary | ICD-10-CM | POA: Diagnosis not present

## 2022-11-30 ENCOUNTER — Other Ambulatory Visit: Payer: Self-pay | Admitting: Cardiovascular Disease

## 2022-12-03 ENCOUNTER — Encounter (INDEPENDENT_AMBULATORY_CARE_PROVIDER_SITE_OTHER): Payer: Medicare Other | Admitting: Ophthalmology

## 2022-12-03 DIAGNOSIS — H43813 Vitreous degeneration, bilateral: Secondary | ICD-10-CM | POA: Diagnosis not present

## 2022-12-03 DIAGNOSIS — H353221 Exudative age-related macular degeneration, left eye, with active choroidal neovascularization: Secondary | ICD-10-CM | POA: Diagnosis not present

## 2022-12-03 DIAGNOSIS — H353112 Nonexudative age-related macular degeneration, right eye, intermediate dry stage: Secondary | ICD-10-CM

## 2022-12-03 DIAGNOSIS — H35033 Hypertensive retinopathy, bilateral: Secondary | ICD-10-CM

## 2022-12-03 DIAGNOSIS — I10 Essential (primary) hypertension: Secondary | ICD-10-CM | POA: Diagnosis not present

## 2022-12-03 NOTE — Progress Notes (Signed)
Cardiology Office Note   Date:  12/03/2022   ID:  Barry [name], Barry [name] 1946-11-09, MRN [id number]  PCP:  [name], MD  Cardiologist:   Jenkins Rouge, MD   No chief complaint on file.     History of Present Illness: Barry [name] is a 76 y.o. male who was first seen in March 2020  regarding CAD Referred by Dr Lysle Rubens. 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   Echo 04/05/22 EF 50-55% mild AR Myovue 08/13/22 no ischemia EF 57%   His caretaker/friend Margaretha Sheffield asked if he could have orthopedic surgery. Given low normal EF and non ischemic myovue this year I think he would be ok from cardiac perspective Surgeon Dr Lyla Glassing   However 12/7 at visit with Dr Lysle Rubens noted to be in [place]. Was not put on blood thinner Today ECG confirms afib. Discussed diagnosis and stroke risk   Past Medical History:  Diagnosis Date   A-fib [facility])    Acute sinusitis 03/24/2012   Followed in Pulmonary clinic/ [facility] [name] - CT sinus 08/07/2013  Short air-fluid levels in the maxillary sinuses bilaterally suggesting early acute sinusitis. - repeat ct sinus 04/30/2014 > ok     Acute sinusitis, unspecified 08/14/2010   Qualifier: Diagnosis of  By: [name], [name]     Asthma    PFTs 06/15/05 FEV1 85% predicted ratio 68% and truncation of resp loop in a sawtooth pattern. HFA 25% 08-01-2009 >50% Nov 01, 2009>50% 01-31-2010   Asthma, chronic 11/24/2007   Followed in Pulmonary clinic/ [facility] [name]  - PFTs 06/15/2005 FEV1 85% predicted ratio 68% and truncation of respiratory loop in a sawtooth pattern  - HFA 25% August 01, 2009 > 50% November 01, 2009 > 50% January 31, 2010 therefore changed  to neb bud/brovana - HFA 75% p extensive coaching 08/07/2013  > insurance issues so try symbicort 160 2 bid instead of neb - 11/16/2015   p extensive   BRONCHITIS, ACUTE 01/22/2011   Qualifier: Diagnosis of  By: [name], [name]     CAD (coronary artery disease), native coronary artery 02/06/2019   S/p CABG 0626 complicated by paralyzed left hemidiaphram   Cataract    Chronic cough    Chronic rhinitis 05/02/2009   Followed in Pulmonary clinic/ [facility] [name]    - 03/24/12 CT Sinus > Negative paranasal sinuses - Sinus CT 08/07/2013 >>Short air-fluid levels in the maxillary sinuses bilaterally suggesting early acute sinusitis - Repeat augmentin x 21 days 03/27/2014 then sinus ct if not better  - flonase/afrin regimen 02/11/15 >>>      Coronary heart disease    COUGH, CHRONIC 11/24/2007   Followed in Pulmonary clinic/ [facility] [name]      DDD (degenerative disc disease), lumbar    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   [facility] [name]  -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     Excessive ear wax, bilateral 09/15/2018   GASTROESOPHAGEAL REFLUX DISEASE 08/27/2008   Followed as Primary Care Patient/ GI/ Dr  Earle Gell     Glaucoma    Hyperlipidemia LDL goal <70 02/06/2019   Hypertension  Obesity    Osteoarthritis    Paralyzed hemidiaphragm    left, 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   Retina disorder 2014   Severe obesity (BMI >= 40) (HCC) 11/24/2007       - Target wt < 191 to get under BMI 30    Ulcerative colitis    ULCERATIVE COLITIS 11/24/2007   Followed as Primary Care Patient/ GI/ Dr  Earle Gell     URI 04/09/2008   Qualifier: Diagnosis of  By: [name], [name]      Past Surgical History:  Procedure Laterality Date   CARDIAC CATHETERIZATION Left    COLONOSCOPY WITH PROPOFOL N/A 11/29/2015   Procedure: COLONOSCOPY WITH PROPOFOL;  Surgeon: [name], MD;  Location: [facility] ENDOSCOPY;  Service: Endoscopy;  Laterality: N/A;   CORONARY  ARTERY BYPASS GRAFT     x6     Current Outpatient Medications  Medication Sig Dispense Refill   acetaminophen (TYLENOL) 500 MG tablet Take per bottle as needed for arthritis     aspirin 81 MG tablet Take 81 mg by mouth daily.     atorvastatin (LIPITOR) 40 MG tablet TAKE 1 TABLET BY MOUTH EVERY DAY 90 tablet 0   betamethasone dipropionate 0.05 % lotion As directed     budesonide-formoterol (SYMBICORT) 160-4.5 MCG/ACT inhaler TAKE 2 PUFFS BY MOUTH TWICE A DAY 30.6 each 12   carboxymethylcellulose (REFRESH PLUS) 0.5 % SOLN Use as needed for dry eye     Cholecalciferol (VITAMIN D) 50 MCG (2000 UT) tablet Take 2,000 Units by mouth daily.      cycloSPORINE (RESTASIS) 0.05 % ophthalmic emulsion Place 1 drop into both eyes 2 (two) times daily.     diltiazem (CARDIZEM CD) 240 MG 24 hr capsule TAKE 1 CAPSULE BY MOUTH EVERY DAY 90 capsule 3   docusate sodium (COLACE) 100 MG capsule Take 100 mg by mouth daily as needed for mild constipation.     escitalopram (LEXAPRO) 10 MG tablet Take by mouth daily at 6 (six) AM.     Eyelid Cleansers (OCUSOFT EYELID CLEANSING) PADS Apply topically. Use daily     ezetimibe (ZETIA) 10 MG tablet TAKE 1 TABLET BY MOUTH EVERY DAY 90 tablet 3   fexofenadine (ALLEGRA) 180 MG tablet Take 180 mg by mouth as needed for allergies.     fluticasone (FLONASE) 50 MCG/ACT nasal spray Place 2 sprays into both nostrils 2 (two) times daily.      latanoprost (XALATAN) 0.005 % ophthalmic solution Place 1 drop into both eyes at bedtime.     losartan (COZAAR) 25 MG tablet Take 25 mg by mouth daily.     meloxicam (MOBIC) 15 MG tablet Take 15 mg by mouth daily.     metoprolol tartrate (LOPRESSOR) 25 MG tablet TAKE 1 TABLET BY MOUTH TWICE A DAY 180 tablet 0   montelukast (SINGULAIR) 10 MG tablet ONE AT BEDTIME EVERY NIGHT 90 tablet 0   Multiple Vitamins-Minerals (CENTRUM SILVER PO) Take 1 tablet by mouth daily.     nitroGLYCERIN (NITROSTAT) 0.4 MG SL tablet Place 1 tablet (0.4 mg total)  under the tongue every 5 (five) minutes as needed for chest pain. 25 tablet 3   omeprazole (PRILOSEC) 20 MG capsule Take 20 mg by mouth 2 (two) times daily before a meal.     Polyethyl Glyc-Propyl Glyc PF 0.4-0.3 % SOLN Apply 1 drop to eye as needed.     Pseudoephedrine-Ibuprofen 30-200 MG TABS Per bottle as needed for sinus pressure/pain     Spacer/Aero-Holding  Chambers (AEROCHAMBER MV) inhaler Use as instructed 1 each 0   sulfaSALAzine (AZULFIDINE) 500 MG tablet Take 1,000 mg by mouth 4 (four) times daily.     VENTOLIN HFA 108 (90 Base) MCG/ACT inhaler INHALE 2 PUFFS INTO THE LUNGS EVERY 4 HRS AS NEEDED FOR WHEEZING OR SHORTNESS OF BREATH 18 Inhaler 5   No current facility-administered medications for this visit.    Allergies:   Piroxicam    Social History:  The patient  reports that he has never smoked. He has never used smokeless tobacco. He reports that he does not drink alcohol and does not use drugs.   Family History:  The patient's family history includes Heart disease in his father; Hypertension in his mother; Other in his sister; Pulmonary Hypertension in his mother; Renal Disease in his mother.    ROS:  Please see the history of present illness.   Otherwise, review of systems are positive for none.   All other systems are reviewed and negative.    PHYSICAL EXAM: VS:  There were no vitals taken for this visit. , BMI There is no height or weight on file to calculate BMI. Affect appropriate Overweight white male Chronically ill  HEENT: normal Neck supple with no adenopathy JVP normal no bruits no thyromegaly Lungs clear with no wheezing and good diaphragmatic motion Heart:  S1/S2 no murmur, no rub, gallop or click PMI normal post sternotomy  Abdomen: benighn, BS positve, no tenderness, no AAA no bruit.  No HSM or HJR Distal pulses intact with no bruits No edema Neuro non-focal Skin warm and dry LE orthopedic alignment issues knees down  with inner knee calluses     EKG:  12/03/2022 NSR inferior lateral T wave changes 12/07/2022 SR rate 98 T inversions inferior    Recent Labs: No results found for requested labs within last 365 days.    Lipid Panel No results found for: "CHOL", "TRIG", "HDL", "CHOLHDL", "VLDL", "LDLCALC", "LDLDIRECT"    Wt Readings from Last 3 Encounters:  08/13/22 205 lb (93 kg)  08/06/22 205 lb (93 kg)  06/25/22 200 lb (90.7 kg)      Other studies Reviewed: Additional studies/ records that were reviewed today include: Notes from Dr Wynonia Lawman cardiology ECG labs CABG report 2001 notes from pulmonary .    ASSESSMENT AND PLAN:  1. CAD/CABG:  2001  No angina continue medical Rx Myovue 08/13/22 with diaphragmatic attenuation no ischemia EF 57% 2. HTN:  Well controlled.  Continue current medications and low sodium Dash type diet.   3. HLD:  Continue statin labs with primary  4. Pulmonary:  No active wheezing f/u [name] Parret Carnegie continue Symbicort, Allegra and Flonase  5. UC:  Continue steroids and sulfa f/u Dr Wynetta Emery GI 6. Fatigue:  EF 50-55% with mild AR on TTE 04/05/22 non cardiac related EF 57% on myovue 08/13/22    7. Preoperative:  ? Right TKR will need to be delayed with new diagnosis of PAF   8. Afib:  new diagnosis D/c cozaar to make room for adjustment of AV nodal drugs Continue cardizem Increase lopressor to 50 mg bid Start eliquis 5 bid. Update TTE for EF and atrial sizes. Check labs to make sure blood thinner ok   Current medicines are reviewed at length with the patient today.  The patient does not have concerns regarding medicines.  The following changes have been made:  d/c Cozaar increase lopressor 50 mg bid  Labs/ tests ordered today include  CBC/BMET  TTE  No orders of the defined types were placed in this encounter.    Disposition:   FU with cardiology  3-4 weeks     Signed, Jenkins Rouge, MD  12/03/2022 12:20 PM    [facility] [address], [place], [place]   [address] Phone: [phone]; Fax: [phone]

## 2022-12-07 ENCOUNTER — Encounter: Payer: Self-pay | Admitting: Cardiovascular Disease

## 2022-12-07 ENCOUNTER — Ambulatory Visit: Payer: Medicare Other | Attending: Cardiovascular Disease | Admitting: Cardiovascular Disease

## 2022-12-07 VITALS — BP 112/70 | HR 100 | Ht 63.0 in | Wt 208.4 lb

## 2022-12-07 DIAGNOSIS — I10 Essential (primary) hypertension: Secondary | ICD-10-CM | POA: Diagnosis not present

## 2022-12-07 DIAGNOSIS — I25810 Atherosclerosis of coronary artery bypass graft(s) without angina pectoris: Secondary | ICD-10-CM | POA: Insufficient documentation

## 2022-12-07 DIAGNOSIS — I4891 Unspecified atrial fibrillation: Secondary | ICD-10-CM | POA: Diagnosis not present

## 2022-12-07 DIAGNOSIS — Z01810 Encounter for preprocedural cardiovascular examination: Secondary | ICD-10-CM | POA: Insufficient documentation

## 2022-12-07 DIAGNOSIS — E785 Hyperlipidemia, unspecified: Secondary | ICD-10-CM | POA: Diagnosis not present

## 2022-12-07 MED ORDER — APIXABAN 5 MG PO TABS: 5.0000 mg | ORAL_TABLET | Freq: Two times a day (BID) | ORAL | 6 refills | Status: DC

## 2022-12-07 MED ORDER — METOPROLOL TARTRATE 50 MG PO TABS: 50.0000 mg | ORAL_TABLET | Freq: Two times a day (BID) | ORAL | 0 refills | Status: DC

## 2022-12-07 NOTE — Patient Instructions (Addendum)
Medication Instructions:  INCREASE METOPROLOL TO 50 MG TWICE DAILY  START ELIQUIS 5 MG TWICE DAILY  STOP LOSARTAN  *If you need a refill on your cardiac medications before your next appointment, please call your pharmacy*   Lab Work: TODAY CBC AND BMET  If you have labs (blood work) drawn today and your tests are completely normal, you will receive your results only by: [place] (if you have MyChart) OR A paper copy in the mail If you have any lab test that is abnormal or we need to change your treatment, we will call you to review the results.   Testing/Procedures: Your physician has requested that you have an echocardiogram. Echocardiography is a painless test that uses sound waves to create images of your heart. It provides your doctor with information about the size and shape of your heart and how well your heart's chambers and valves are working. This procedure takes approximately one hour. There are no restrictions for this procedure. Please do NOT wear cologne, perfume, aftershave, or lotions (deodorant is allowed). Please arrive 15 minutes prior to your appointment time.    Follow-Up: At [facility], you and your health needs are our priority.  As part of our continuing mission to provide you with exceptional heart care, we have created designated Provider Care Teams.  These Care Teams include your primary Cardiologist (physician) and Advanced Practice Providers (APPs -  Physician Assistants and Nurse Practitioners) who all work together to provide you with the care you need, when you need it.  We recommend signing up for the patient portal called "MyChart".  Sign up information is provided on this After Visit Summary.  MyChart is used to connect with patients for Virtual Visits (Telemedicine).  Patients are able to view lab/test results, encounter notes, upcoming appointments, etc.  Non-urgent messages can be sent to your provider as well.   To learn more about  what you can do with MyChart, go to [email or web address].    Your next appointment:   4 week(s)  The format for your next appointment:   In Person  Provider:   Jenkins Rouge, MD   OR AFIB CLINIC    Other Instructions NONE  Important Information About Sugar

## 2022-12-08 LAB — BASIC METABOLIC PANEL
BUN/Creatinine Ratio: 33 — ABNORMAL HIGH (ref 10–24)
BUN: 28 mg/dL — ABNORMAL HIGH (ref 8–27)
CO2: 21 mmol/L (ref 20–29)
Calcium: 9.7 mg/dL (ref 8.6–10.2)
Chloride: 105 mmol/L (ref 96–106)
Creatinine, Ser: 0.85 mg/dL (ref 0.76–1.27)
Glucose: 98 mg/dL (ref 70–99)
Potassium: 4.4 mmol/L (ref 3.5–5.2)
Sodium: 142 mmol/L (ref 134–144)
eGFR: 89 mL/min/{1.73_m2} (ref >59)

## 2022-12-08 LAB — CBC
Hematocrit: 35.8 % — ABNORMAL LOW (ref 37.5–51.0)
Hemoglobin: 11.9 g/dL — ABNORMAL LOW (ref 13.0–17.7)
MCH: 30.6 pg (ref 26.6–33.0)
MCHC: 33.2 g/dL (ref 31.5–35.7)
MCV: 92 fL (ref 79–97)
Platelets: 218 10*3/uL (ref 150–450)
RBC: 3.89 x10E6/uL — ABNORMAL LOW (ref 4.14–5.80)
RDW: 15 % (ref 11.6–15.4)
WBC: 5.6 10*3/uL (ref 3.4–10.8)

## 2022-12-12 DIAGNOSIS — I4891 Unspecified atrial fibrillation: Secondary | ICD-10-CM | POA: Diagnosis not present

## 2022-12-12 DIAGNOSIS — I10 Essential (primary) hypertension: Secondary | ICD-10-CM | POA: Diagnosis not present

## 2022-12-12 DIAGNOSIS — I252 Old myocardial infarction: Secondary | ICD-10-CM | POA: Diagnosis not present

## 2022-12-12 DIAGNOSIS — I2510 Atherosclerotic heart disease of native coronary artery without angina pectoris: Secondary | ICD-10-CM | POA: Diagnosis not present

## 2022-12-12 DIAGNOSIS — E7800 Pure hypercholesterolemia, unspecified: Secondary | ICD-10-CM | POA: Diagnosis not present

## 2022-12-12 DIAGNOSIS — J45909 Unspecified asthma, uncomplicated: Secondary | ICD-10-CM | POA: Diagnosis not present

## 2022-12-19 DIAGNOSIS — J45909 Unspecified asthma, uncomplicated: Secondary | ICD-10-CM | POA: Diagnosis not present

## 2022-12-19 DIAGNOSIS — I4891 Unspecified atrial fibrillation: Secondary | ICD-10-CM | POA: Diagnosis not present

## 2022-12-19 DIAGNOSIS — E7800 Pure hypercholesterolemia, unspecified: Secondary | ICD-10-CM | POA: Diagnosis not present

## 2022-12-19 DIAGNOSIS — I252 Old myocardial infarction: Secondary | ICD-10-CM | POA: Diagnosis not present

## 2022-12-19 DIAGNOSIS — I10 Essential (primary) hypertension: Secondary | ICD-10-CM | POA: Diagnosis not present

## 2022-12-19 DIAGNOSIS — I2510 Atherosclerotic heart disease of native coronary artery without angina pectoris: Secondary | ICD-10-CM | POA: Diagnosis not present

## 2022-12-23 DIAGNOSIS — F341 Dysthymic disorder: Secondary | ICD-10-CM | POA: Diagnosis not present

## 2022-12-23 DIAGNOSIS — I10 Essential (primary) hypertension: Secondary | ICD-10-CM | POA: Diagnosis not present

## 2022-12-23 DIAGNOSIS — I252 Old myocardial infarction: Secondary | ICD-10-CM | POA: Diagnosis not present

## 2022-12-23 DIAGNOSIS — H353220 Exudative age-related macular degeneration, left eye, stage unspecified: Secondary | ICD-10-CM | POA: Diagnosis not present

## 2022-12-23 DIAGNOSIS — M719 Bursopathy, unspecified: Secondary | ICD-10-CM | POA: Diagnosis not present

## 2022-12-23 DIAGNOSIS — Z7951 Long term (current) use of inhaled steroids: Secondary | ICD-10-CM | POA: Diagnosis not present

## 2022-12-23 DIAGNOSIS — J45909 Unspecified asthma, uncomplicated: Secondary | ICD-10-CM | POA: Diagnosis not present

## 2022-12-23 DIAGNOSIS — I2510 Atherosclerotic heart disease of native coronary artery without angina pectoris: Secondary | ICD-10-CM | POA: Diagnosis not present

## 2022-12-23 DIAGNOSIS — E7800 Pure hypercholesterolemia, unspecified: Secondary | ICD-10-CM | POA: Diagnosis not present

## 2022-12-23 DIAGNOSIS — Z6834 Body mass index (BMI) 34.0-34.9, adult: Secondary | ICD-10-CM | POA: Diagnosis not present

## 2022-12-23 DIAGNOSIS — Z9181 History of falling: Secondary | ICD-10-CM | POA: Diagnosis not present

## 2022-12-23 DIAGNOSIS — M1710 Unilateral primary osteoarthritis, unspecified knee: Secondary | ICD-10-CM | POA: Diagnosis not present

## 2022-12-23 DIAGNOSIS — J42 Unspecified chronic bronchitis: Secondary | ICD-10-CM | POA: Diagnosis not present

## 2022-12-23 DIAGNOSIS — K5120 Ulcerative (chronic) proctitis without complications: Secondary | ICD-10-CM | POA: Diagnosis not present

## 2022-12-23 DIAGNOSIS — K219 Gastro-esophageal reflux disease without esophagitis: Secondary | ICD-10-CM | POA: Diagnosis not present

## 2022-12-23 DIAGNOSIS — I4891 Unspecified atrial fibrillation: Secondary | ICD-10-CM | POA: Diagnosis not present

## 2022-12-24 DIAGNOSIS — I2510 Atherosclerotic heart disease of native coronary artery without angina pectoris: Secondary | ICD-10-CM | POA: Diagnosis not present

## 2022-12-24 DIAGNOSIS — E7800 Pure hypercholesterolemia, unspecified: Secondary | ICD-10-CM | POA: Diagnosis not present

## 2022-12-24 DIAGNOSIS — I4891 Unspecified atrial fibrillation: Secondary | ICD-10-CM | POA: Diagnosis not present

## 2022-12-24 DIAGNOSIS — I10 Essential (primary) hypertension: Secondary | ICD-10-CM | POA: Diagnosis not present

## 2022-12-24 DIAGNOSIS — J45909 Unspecified asthma, uncomplicated: Secondary | ICD-10-CM | POA: Diagnosis not present

## 2022-12-24 DIAGNOSIS — I252 Old myocardial infarction: Secondary | ICD-10-CM | POA: Diagnosis not present

## 2023-01-03 DIAGNOSIS — E7800 Pure hypercholesterolemia, unspecified: Secondary | ICD-10-CM | POA: Diagnosis not present

## 2023-01-03 DIAGNOSIS — I4891 Unspecified atrial fibrillation: Secondary | ICD-10-CM | POA: Diagnosis not present

## 2023-01-03 DIAGNOSIS — I2510 Atherosclerotic heart disease of native coronary artery without angina pectoris: Secondary | ICD-10-CM | POA: Diagnosis not present

## 2023-01-03 DIAGNOSIS — I10 Essential (primary) hypertension: Secondary | ICD-10-CM | POA: Diagnosis not present

## 2023-01-03 DIAGNOSIS — I252 Old myocardial infarction: Secondary | ICD-10-CM | POA: Diagnosis not present

## 2023-01-03 DIAGNOSIS — J45909 Unspecified asthma, uncomplicated: Secondary | ICD-10-CM | POA: Diagnosis not present

## 2023-01-03 NOTE — Progress Notes (Signed)
Office Visit    Patient Name: Barry [name] Date of Encounter: 01/04/2023  PCP:  [name], [facility]  Cardiologist:  Jenkins Rouge, MD  Advanced Practice Provider:  No care team member to display Electrophysiologist:  None   HPI    Barry [name] is a 77 y.o. male with a past medical history significant for atrial fibrillation, CAD status post CABG 2001, asthma, hypertension, hyperlipidemia presents today for follow-up appointment.  Was previously patient of Dr. Wynonia Lawman.  Sees pulmonology for paralyzed left hemidiaphragm.  He has history of CAD with CABG back in August 2001.  He has UC and requires sulfasalazine and steroids.  He uses a walker for some lower extremity orthopedic issues.  He was seen 03/2022 and was having no angina but some fatigue and dyspnea.  Echocardiogram 10/2019 was done for fatigue which showed EF of 55% with no pulmonary hypertension and AV sclerosis.  His caregiver asked if he could have orthopedic surgery.  He was last seen for cardiac clearance 08/06/2022.  He was being cleared for left knee surgery.  The plan was to schedule a Lexiscan Myoview prior to clearance.  After questioning he has scored a 4.06 on the DASI.  He was unable to walk 2 blocks due to his knee.  He had not had any chest pain, shortness of breath, or palpitations.  He was taking Mobic for his NSAID and was not taking aspirin.  He presents to the clinic today for further workup of new onset atrial fibrillation.  He was started on Eliquis by Dr. Admission 4 weeks ago and has been compliant with his medication.  He has not had any significant symptoms.  He is in normal sinus rhythm today on exam.  He has follow-up with the A-fib clinic next week as well as a scheduled echocardiogram on Wednesday.  We went through some options for his atrial fibrillation including medication management, DCCV, and ablation.  He was wondering if his syncopal episode had anything to do  with atrial fibrillation.  Not technically documented until his follow-up appointment with his primary care the following day.    Reports no shortness of breath nor dyspnea on exertion. Reports no chest pain, pressure, or tightness. No edema, orthopnea, PND. Reports no palpitations.    Past Medical History    Past Medical History:  Diagnosis Date   A-fib [facility])    Acute sinusitis 03/24/2012   Followed in Pulmonary clinic/ [facility]/ [name] - CT sinus 08/07/2013  Short air-fluid levels in the maxillary sinuses bilaterally suggesting early acute sinusitis. - repeat ct sinus 04/30/2014 > ok     Acute sinusitis, unspecified 08/14/2010   Qualifier: Diagnosis of  By: [name], [name]     Asthma    PFTs 06/15/05 FEV1 85% predicted ratio 68% and truncation of resp loop in a sawtooth pattern. HFA 25% 08-01-2009 >50% Nov 01, 2009>50% 01-31-2010   Asthma, chronic 11/24/2007   Followed in Pulmonary clinic/ [facility]/ [name]  - PFTs 06/15/2005 FEV1 85% predicted ratio 68% and truncation of respiratory loop in a sawtooth pattern  - HFA 25% August 01, 2009 > 50% November 01, 2009 > 50% January 31, 2010 therefore changed to neb bud/brovana - HFA 75% p extensive coaching 08/07/2013  > insurance issues so try symbicort 160 2 bid instead of neb - 11/16/2015   p extensive   BRONCHITIS, ACUTE 01/22/2011   Qualifier: Diagnosis of  By: [name], [name]  CAD (coronary artery disease), native coronary artery 02/06/2019   S/p CABG 2774 complicated by paralyzed left hemidiaphram   Cataract    Chronic cough    Chronic rhinitis 05/02/2009   Followed in Pulmonary clinic/ [facility]/ [name]    - 03/24/12 CT Sinus > Negative paranasal sinuses - Sinus CT 08/07/2013 >>Short air-fluid levels in the maxillary sinuses bilaterally suggesting early acute sinusitis - Repeat augmentin x 21 days 03/27/2014 then sinus ct if not better  - flonase/afrin regimen 02/11/15 >>>      Coronary heart disease    COUGH, CHRONIC 11/24/2007    Followed in Pulmonary clinic/ [facility]/ [name]      DDD (degenerative disc disease), lumbar    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   Followed in Pulmonary clinic/ [facility]/ [name]  -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     Excessive ear wax, bilateral 09/15/2018   GASTROESOPHAGEAL REFLUX DISEASE 08/27/2008   Followed as Primary Care Patient/ GI/ Dr  Earle Gell     Glaucoma    Hyperlipidemia LDL goal <70 02/06/2019   Hypertension    Obesity    Osteoarthritis    Paralyzed hemidiaphragm    left, 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   Retina disorder 2014   Severe obesity (BMI >= 40) ([place]) 11/24/2007       - Target wt < 191 to get under BMI 30    Ulcerative colitis    ULCERATIVE COLITIS 11/24/2007   Followed as Primary Care Patient/ GI/ Dr  Earle Gell     URI 04/09/2008   Qualifier: Diagnosis of  By: [name], [name]     Past Surgical History:  Procedure Laterality Date   CARDIAC CATHETERIZATION Left    COLONOSCOPY WITH PROPOFOL N/A 11/29/2015   Procedure: COLONOSCOPY WITH PROPOFOL;  Surgeon: [name], MD;  Location: [facility] ENDOSCOPY;  Service: Endoscopy;  Laterality: N/A;   CORONARY ARTERY BYPASS GRAFT     x6    Allergies  Allergies  Allergen Reactions   Piroxicam Itching and Rash    EKGs/Labs/Other Studies Reviewed:   The following studies were reviewed today: Echocardiogram 04/05/22 IMPRESSIONS     1. Left ventricular ejection fraction, by estimation, is 50 to 55%. The  left ventricle has low normal function. The left ventricle demonstrates  regional wall motion abnormalities (see scoring diagram/findings for  description). There is mild concentric  left ventricular hypertrophy. Left ventricular diastolic parameters are  indeterminate.   2. Right ventricular systolic function is normal. The right ventricular  size is normal. Tricuspid regurgitation signal is inadequate for assessing  PA pressure.   3. The mitral valve is normal in structure. Trivial  mitral valve  regurgitation. No evidence of mitral stenosis.   4. The aortic valve is normal in structure. Aortic valve regurgitation is  mild. Aortic valve sclerosis is present, with no evidence of aortic valve  stenosis.   5. The inferior vena cava is normal in size with greater than 50%  respiratory variability, suggesting right atrial pressure of 3 mmHg.   Comparison(s): A prior study was performed on 11/06/2019. No significant  change from prior study.   EKG:  EKG is not ordered today.   Recent Labs: 12/07/2022: BUN 28; Creatinine, Ser 0.85; Hemoglobin 11.9; Platelets 218; Potassium 4.4; Sodium 142  Recent Lipid Panel No results found for: "CHOL", "TRIG", "HDL", "CHOLHDL", "VLDL", "LDLCALC", "LDLDIRECT"  Risk Assessment/Calculations:   [id number] Score = 4  {Click here to calculate score.This indicates a 4.8% annual risk of stroke. The  patient's score is based upon: CHF History: 0 HTN History: 1 Diabetes History: 0 Stroke History: 0 Vascular Disease History: 1 Age Score: 2 Gender Score: 0    Home Medications   Current Meds  Medication Sig   acetaminophen (TYLENOL) 500 MG tablet Take per bottle as needed for arthritis   apixaban (ELIQUIS) 5 MG TABS tablet Take 1 tablet (5 mg total) by mouth 2 (two) times daily.   atorvastatin (LIPITOR) 40 MG tablet TAKE 1 TABLET BY MOUTH EVERY DAY   betamethasone dipropionate 0.05 % lotion As directed   budesonide-formoterol (SYMBICORT) 160-4.5 MCG/ACT inhaler TAKE 2 PUFFS BY MOUTH TWICE A DAY   carboxymethylcellulose (REFRESH PLUS) 0.5 % SOLN Use as needed for dry eye   Cholecalciferol (VITAMIN D) 50 MCG (2000 UT) tablet Take 2,000 Units by mouth daily.    cycloSPORINE (RESTASIS) 0.05 % ophthalmic emulsion Place 1 drop into both eyes 2 (two) times daily.   diltiazem (CARDIZEM CD) 240 MG 24 hr capsule TAKE 1 CAPSULE BY MOUTH EVERY DAY   docusate sodium (COLACE) 100 MG capsule Take 100 mg by mouth daily as needed for mild  constipation.   escitalopram (LEXAPRO) 10 MG tablet Take by mouth daily at 6 (six) AM.   Eyelid Cleansers (OCUSOFT EYELID CLEANSING) PADS Apply topically. Use daily   ezetimibe (ZETIA) 10 MG tablet TAKE 1 TABLET BY MOUTH EVERY DAY   fexofenadine (ALLEGRA) 180 MG tablet Take 180 mg by mouth as needed for allergies.   fluticasone (FLONASE) 50 MCG/ACT nasal spray Place 2 sprays into both nostrils 2 (two) times daily.    latanoprost (XALATAN) 0.005 % ophthalmic solution Place 1 drop into both eyes at bedtime.   meloxicam (MOBIC) 15 MG tablet Take 15 mg by mouth daily.   metoprolol tartrate (LOPRESSOR) 50 MG tablet Take 1 tablet (50 mg total) by mouth 2 (two) times daily.   montelukast (SINGULAIR) 10 MG tablet ONE AT BEDTIME EVERY NIGHT   Multiple Vitamins-Minerals (CENTRUM SILVER PO) Take 1 tablet by mouth daily.   nitroGLYCERIN (NITROSTAT) 0.4 MG SL tablet Place 1 tablet (0.4 mg total) under the tongue every 5 (five) minutes as needed for chest pain.   omeprazole (PRILOSEC) 20 MG capsule Take 20 mg by mouth 2 (two) times daily before a meal.   Polyethyl Glyc-Propyl Glyc PF 0.4-0.3 % SOLN Apply 1 drop to eye as needed.   Pseudoephedrine-Ibuprofen 30-200 MG TABS Per bottle as needed for sinus pressure/pain   Spacer/Aero-Holding Chambers (AEROCHAMBER MV) inhaler Use as instructed   sulfaSALAzine (AZULFIDINE) 500 MG tablet Take 1,000 mg by mouth 4 (four) times daily.   VENTOLIN HFA 108 (90 Base) MCG/ACT inhaler INHALE 2 PUFFS INTO THE LUNGS EVERY 4 HRS AS NEEDED FOR WHEEZING OR SHORTNESS OF BREATH     Review of Systems      All other systems reviewed and are otherwise negative except as noted above.  Physical Exam    VS:  BP 114/64   Pulse 73   Ht '5\' 3"'$  (1.6 m)   Wt 209 lb 6.4 oz (95 kg)   SpO2 96%   BMI 37.09 kg/m  , BMI Body mass index is 37.09 kg/m.  Wt Readings from Last 3 Encounters:  01/04/23 209 lb 6.4 oz (95 kg)  12/07/22 208 lb 6.4 oz (94.5 kg)  08/13/22 205 lb (93 kg)      GEN: Well nourished, well developed, in no acute distress. HEENT: normal. Neck: Supple, no JVD, carotid bruits, or masses. Cardiac: RRR,  no murmurs, rubs, or gallops. No clubbing, cyanosis, edema.  Radials/PT 2+ and equal bilaterally.  Respiratory:  Respirations regular and unlabored, clear to auscultation bilaterally. GI: Soft, nontender, nondistended. MS: No deformity or atrophy. Skin: Warm and dry, no rash. Neuro:  Strength and sensation are intact. Psych: Normal affect.  Assessment & Plan    New onset Afib -Continue Eliquis 5 mg twice daily -Continue metoprolol -He is in normal sinus rhythm today in the 70s -Would keep follow-up with a fib clinic next week as well as echocardiogram  CAD/CABG 2001 -No chest pain -Would continue with current medications  Hypertension -Well-controlled here in the clinic -No medication changes today -Continue to monitor at home -Hypotension has been an issue in the past  Hyperlipidemia -LDL 113 which is slightly above goal -Continue Zetia 10 mg daily and Lipitor 40 mg daily (this may need increased if remains high at next visit) -LDL goal less than 70 due to history of CAD          Disposition: Follow up 2-3 months with Jenkins Rouge, MD or APP.  Signed, Elgie Collard, PA-C 01/04/2023, 12:35 PM [facility]

## 2023-01-04 ENCOUNTER — Encounter: Payer: Self-pay | Admitting: Physician Assistant

## 2023-01-04 ENCOUNTER — Ambulatory Visit: Payer: Medicare Other | Attending: Physician Assistant | Admitting: Physician Assistant

## 2023-01-04 VITALS — BP 114/64 | HR 73 | Ht 63.0 in | Wt 209.4 lb

## 2023-01-04 DIAGNOSIS — E785 Hyperlipidemia, unspecified: Secondary | ICD-10-CM | POA: Diagnosis not present

## 2023-01-04 DIAGNOSIS — I4891 Unspecified atrial fibrillation: Secondary | ICD-10-CM | POA: Diagnosis not present

## 2023-01-04 DIAGNOSIS — I25810 Atherosclerosis of coronary artery bypass graft(s) without angina pectoris: Secondary | ICD-10-CM

## 2023-01-04 DIAGNOSIS — I10 Essential (primary) hypertension: Secondary | ICD-10-CM | POA: Diagnosis not present

## 2023-01-04 DIAGNOSIS — R5383 Other fatigue: Secondary | ICD-10-CM | POA: Diagnosis not present

## 2023-01-04 NOTE — Patient Instructions (Addendum)
Medication Instructions:  Your physician recommends that you continue on your current medications as directed. Please refer to the Current Medication list given to you today.  *If you need a refill on your cardiac medications before your next appointment, please call your pharmacy*   Lab Work: None ordered If you have labs (blood work) drawn today and your tests are completely normal, you will receive your results only by: Guys (if you have MyChart) OR A paper copy in the mail If you have any lab test that is abnormal or we need to change your treatment, we will call you to review the results.   Testing/Procedures: ECHO as scheduled 01/09/23 at 11:35 AM   Follow-Up: At [facility], you and your health needs are our priority.  As part of our continuing mission to provide you with exceptional heart care, we have created designated Provider Care Teams.  These Care Teams include your primary Cardiologist (physician) and Advanced Practice Providers (APPs -  Physician Assistants and Nurse Practitioners) who all work together to provide you with the care you need, when you need it.  We recommend signing up for the patient portal called "MyChart".  Sign up information is provided on this After Visit Summary.  MyChart is used to connect with patients for Virtual Visits (Telemedicine).  Patients are able to view lab/test results, encounter notes, upcoming appointments, etc.  Non-urgent messages can be sent to your provider as well.   To learn more about what you can do with MyChart, go to [email or web address].    Your next appointment:   2-3 month(s)  Provider:   Jenkins Rouge, MD   Keep 01/11/23 at 10:30 AM as scheduled with the Afib clinic

## 2023-01-07 DIAGNOSIS — I10 Essential (primary) hypertension: Secondary | ICD-10-CM | POA: Diagnosis not present

## 2023-01-07 DIAGNOSIS — E7800 Pure hypercholesterolemia, unspecified: Secondary | ICD-10-CM | POA: Diagnosis not present

## 2023-01-07 DIAGNOSIS — I2510 Atherosclerotic heart disease of native coronary artery without angina pectoris: Secondary | ICD-10-CM | POA: Diagnosis not present

## 2023-01-07 DIAGNOSIS — J45909 Unspecified asthma, uncomplicated: Secondary | ICD-10-CM | POA: Diagnosis not present

## 2023-01-07 DIAGNOSIS — I252 Old myocardial infarction: Secondary | ICD-10-CM | POA: Diagnosis not present

## 2023-01-07 DIAGNOSIS — I4891 Unspecified atrial fibrillation: Secondary | ICD-10-CM | POA: Diagnosis not present

## 2023-01-09 ENCOUNTER — Other Ambulatory Visit: Payer: Self-pay | Admitting: Cardiovascular Disease

## 2023-01-09 ENCOUNTER — Ambulatory Visit (HOSPITAL_COMMUNITY): Payer: Medicare Other | Attending: Cardiovascular Disease

## 2023-01-09 DIAGNOSIS — I4891 Unspecified atrial fibrillation: Secondary | ICD-10-CM | POA: Diagnosis not present

## 2023-01-09 LAB — ECHOCARDIOGRAM COMPLETE
Area-P 1/2: 3.75 cm2
S' Lateral: 2.5 cm

## 2023-01-10 ENCOUNTER — Telehealth: Payer: Self-pay | Admitting: Cardiovascular Disease

## 2023-01-10 NOTE — Telephone Encounter (Signed)
Patient is returning call. Please advise? 

## 2023-01-11 ENCOUNTER — Other Ambulatory Visit: Payer: Self-pay

## 2023-01-11 ENCOUNTER — Other Ambulatory Visit (HOSPITAL_COMMUNITY): Payer: Self-pay | Admitting: *Deleted

## 2023-01-11 ENCOUNTER — Ambulatory Visit (HOSPITAL_COMMUNITY)
Admission: RE | Admit: 2023-01-11 | Discharge: 2023-01-11 | Disposition: A | Discharge status: Home / Self Care | Payer: Medicare Other | Source: Ambulatory Visit | Attending: Physician Assistant | Admitting: Physician Assistant

## 2023-01-11 ENCOUNTER — Encounter (HOSPITAL_COMMUNITY): Payer: Self-pay | Admitting: *Deleted

## 2023-01-11 VITALS — BP 140/80 | HR 71 | Ht 63.0 in | Wt 211.2 lb

## 2023-01-11 DIAGNOSIS — E785 Hyperlipidemia, unspecified: Secondary | ICD-10-CM | POA: Diagnosis not present

## 2023-01-11 DIAGNOSIS — I10 Essential (primary) hypertension: Secondary | ICD-10-CM | POA: Diagnosis not present

## 2023-01-11 DIAGNOSIS — Z951 Presence of aortocoronary bypass graft: Secondary | ICD-10-CM | POA: Diagnosis not present

## 2023-01-11 DIAGNOSIS — I2510 Atherosclerotic heart disease of native coronary artery without angina pectoris: Secondary | ICD-10-CM | POA: Diagnosis not present

## 2023-01-11 DIAGNOSIS — E669 Obesity, unspecified: Secondary | ICD-10-CM | POA: Diagnosis not present

## 2023-01-11 DIAGNOSIS — I480 Paroxysmal atrial fibrillation: Secondary | ICD-10-CM

## 2023-01-11 DIAGNOSIS — Z6837 Body mass index (BMI) 37.0-37.9, adult: Secondary | ICD-10-CM | POA: Diagnosis not present

## 2023-01-11 DIAGNOSIS — D6869 Other thrombophilia: Secondary | ICD-10-CM | POA: Insufficient documentation

## 2023-01-11 LAB — CBC
HCT: 35.5 % — ABNORMAL LOW (ref 39.0–52.0)
Hemoglobin: 11.3 g/dL — ABNORMAL LOW (ref 13.0–17.0)
MCH: 31 pg (ref 26.0–34.0)
MCHC: 31.8 g/dL (ref 30.0–36.0)
MCV: 97.3 fL (ref 80.0–100.0)
Platelets: 200 10*3/uL (ref 150–400)
RBC: 3.65 MIL/uL — ABNORMAL LOW (ref 4.22–5.81)
RDW: 15.5 % (ref 11.5–15.5)
WBC: 6.7 10*3/uL (ref 4.0–10.5)
nRBC: 0 % (ref 0.0–0.2)

## 2023-01-11 NOTE — Progress Notes (Signed)
Primary Care Physician: [name], MD Primary Cardiologist: Dr Johnsie Cancel Primary Electrophysiologist: none Referring Physician: Dr Normand Sloop is a 77 y.o. male with a history of CAD s/p CABG 2001, asthma, HTN, HLD, atrial fibrillation who presents for consultation in the [facility].  The patient was initially diagnosed with atrial fibrillation 11/22/22 after presenting to his PCP for a routine visit. His heart rhythm was noted to be irregular (ECG from PCP personally reviewed). Patient was started on Eliquis for a CHADS2VASC score of 4 and his BB was increased. Patient does report that he had palpitations at the time. Fortunately, he spontaneously converted to SR. Patient denies significant snoring or alcohol use.   Today, he denies symptoms of chest pain, shortness of breath, orthopnea, PND, lower extremity edema, dizziness, presyncope, syncope, snoring, daytime somnolence, bleeding, or neurologic sequela. The patient is tolerating medications without difficulties and is otherwise without complaint today.    Atrial Fibrillation Risk Factors:  he does not have symptoms or diagnosis of sleep apnea. he does not have a history of rheumatic fever. he does not have a history of alcohol use.   he has a BMI of Body mass index is 37.41 kg/m.[name] Filed Weights   01/11/23 1017  Weight: 95.8 kg    Family History  Problem Relation Age of Onset   Heart disease Father    Hypertension Mother    Renal Disease Mother    Pulmonary Hypertension Mother    Other Sister        UNKNOWN HISTORY     Atrial Fibrillation Management history:  Previous antiarrhythmic drugs: none Previous cardioversions: none Previous ablations: none CHADS2VASC score: 4 Anticoagulation history: Eliquis   Past Medical History:  Diagnosis Date   A-fib ([place])    Acute sinusitis 03/24/2012   Followed in Pulmonary clinic/ [facility]/ [name] - CT sinus 08/07/2013  Short  air-fluid levels in the maxillary sinuses bilaterally suggesting early acute sinusitis. - repeat ct sinus 04/30/2014 > ok     Acute sinusitis, unspecified 08/14/2010   Qualifier: Diagnosis of  By: [name], [name]     Asthma    PFTs 06/15/05 FEV1 85% predicted ratio 68% and truncation of resp loop in a sawtooth pattern. HFA 25% 08-01-2009 >50% Nov 01, 2009>50% 01-31-2010   Asthma, chronic 11/24/2007   Followed in Pulmonary clinic/ [facility]/ [name]  - PFTs 06/15/2005 FEV1 85% predicted ratio 68% and truncation of respiratory loop in a sawtooth pattern  - HFA 25% August 01, 2009 > 50% November 01, 2009 > 50% January 31, 2010 therefore changed to neb bud/brovana - HFA 75% p extensive coaching 08/07/2013  > insurance issues so try symbicort 160 2 bid instead of neb - 11/16/2015   p extensive   BRONCHITIS, ACUTE 01/22/2011   Qualifier: Diagnosis of  By: [name], [name]     CAD (coronary artery disease), native coronary artery 02/06/2019   S/p CABG 3491 complicated by paralyzed left hemidiaphram   Cataract    Chronic cough    Chronic rhinitis 05/02/2009   [name]    - 03/24/12 CT Sinus > Negative paranasal sinuses - Sinus CT 08/07/2013 >>Short air-fluid levels in the maxillary sinuses bilaterally suggesting early acute sinusitis - Repeat augmentin x 21 days 03/27/2014 then sinus ct if not better  - flonase/afrin regimen 02/11/15 >>>      Coronary heart disease    COUGH, CHRONIC 11/24/2007   Followed  in Pulmonary clinic/ [facility]/ [name]      DDD (degenerative disc disease), lumbar    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   Followed in Pulmonary clinic/ [facility]/ [name]  -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     Excessive ear wax, bilateral 09/15/2018   GASTROESOPHAGEAL REFLUX DISEASE 08/27/2008   Followed as Primary Care Patient/ GI/ Dr  Earle Gell     Glaucoma    Hyperlipidemia LDL goal <70 02/06/2019   Hypertension    Obesity     Osteoarthritis    Paralyzed hemidiaphragm    left, 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   Retina disorder 2014   Severe obesity (BMI >= 40) (HCC) 11/24/2007       - Target wt < 191 to get under BMI 30    Ulcerative colitis    ULCERATIVE COLITIS 11/24/2007   Followed as Primary Care Patient/ GI/ Dr  Earle Gell     URI 04/09/2008   Qualifier: Diagnosis of  By: [name], [name]     Past Surgical History:  Procedure Laterality Date   CARDIAC CATHETERIZATION Left    COLONOSCOPY WITH PROPOFOL N/A 11/29/2015   Procedure: COLONOSCOPY WITH PROPOFOL;  Surgeon: [name], MD;  Location: [facility];  Service: Endoscopy;  Laterality: N/A;   CORONARY ARTERY BYPASS GRAFT     x6    Current Outpatient Medications  Medication Sig Dispense Refill   acetaminophen (TYLENOL) 500 MG tablet Take per bottle as needed for arthritis     apixaban (ELIQUIS) 5 MG TABS tablet Take 1 tablet (5 mg total) by mouth 2 (two) times daily. 60 tablet 6   atorvastatin (LIPITOR) 40 MG tablet TAKE 1 TABLET BY MOUTH EVERY DAY 90 tablet 0   betamethasone dipropionate 0.05 % lotion Apply topically as needed. As directed     budesonide-formoterol (SYMBICORT) 160-4.5 MCG/ACT inhaler TAKE 2 PUFFS BY MOUTH TWICE A DAY 30.6 each 12   carboxymethylcellulose (REFRESH PLUS) 0.5 % SOLN Use as needed for dry eye     Cholecalciferol (VITAMIN D) 50 MCG (2000 UT) tablet Take 2,000 Units by mouth daily.      cycloSPORINE (RESTASIS) 0.05 % ophthalmic emulsion Place 1 drop into both eyes 2 (two) times daily.     diltiazem (CARDIZEM CD) 240 MG 24 hr capsule TAKE 1 CAPSULE BY MOUTH EVERY DAY 90 capsule 3   docusate sodium (COLACE) 100 MG capsule Take 100 mg by mouth daily as needed for mild constipation.     escitalopram (LEXAPRO) 10 MG tablet Take by mouth daily at 6 (six) AM.     Eyelid Cleansers (OCUSOFT EYELID CLEANSING) PADS Apply topically as needed. Use daily     ezetimibe (ZETIA) 10 MG tablet TAKE 1 TABLET BY MOUTH EVERY DAY 90 tablet 3    fexofenadine (ALLEGRA) 180 MG tablet Take 180 mg by mouth as needed for allergies.     fluticasone (FLONASE) 50 MCG/ACT nasal spray Place 2 sprays into both nostrils 2 (two) times daily.      latanoprost (XALATAN) 0.005 % ophthalmic solution Place 1 drop into both eyes at bedtime.     metoprolol tartrate (LOPRESSOR) 50 MG tablet TAKE 1 TABLET BY MOUTH TWICE A DAY 180 tablet 3   montelukast (SINGULAIR) 10 MG tablet ONE AT BEDTIME EVERY NIGHT 90 tablet 0   Multiple Vitamins-Minerals (CENTRUM SILVER PO) Take 1 tablet by mouth daily.     nitroGLYCERIN (NITROSTAT) 0.4 MG SL tablet Place 1 tablet (0.4 mg total) under the tongue every 5 (  five) minutes as needed for chest pain. 25 tablet 3   omeprazole (PRILOSEC) 20 MG capsule Take 20 mg by mouth 2 (two) times daily before a meal.     Polyethyl Glyc-Propyl Glyc PF 0.4-0.3 % SOLN Apply 1 drop to eye as needed.     Pseudoephedrine-Ibuprofen 30-200 MG TABS Per bottle as needed for sinus pressure/pain     Spacer/Aero-Holding Chambers (AEROCHAMBER MV) inhaler Use as instructed 1 each 0   sulfaSALAzine (AZULFIDINE) 500 MG tablet Take 1,000 mg by mouth 4 (four) times daily.     VENTOLIN HFA 108 (90 Base) MCG/ACT inhaler INHALE 2 PUFFS INTO THE LUNGS EVERY 4 HRS AS NEEDED FOR WHEEZING OR SHORTNESS OF BREATH 18 Inhaler 5   meloxicam (MOBIC) 15 MG tablet Take 15 mg by mouth daily. (Patient not taking: Reported on 01/11/2023)     No current facility-administered medications for this encounter.    Allergies  Allergen Reactions   Piroxicam Itching and Rash    Social History   Socioeconomic History   Marital status: Single    Spouse name: Not on file   Number of children: 0   Years of education: Not on file   Highest education level: Not on file  Occupational History   Occupation: disabled, prev worked in [employer]  Tobacco Use   Smoking status: Never   Smokeless tobacco: Never  Substance and Sexual Activity   Alcohol use: No   Drug use: No   Sexual  activity: Not on file  Other Topics Concern   Not on file  Social History Narrative   ** Merged History Encounter **       Social Determinants of Health   Financial Resource Strain: Not on file  Food Insecurity: Not on file  Transportation Needs: Not on file  Physical Activity: Not on file  Stress: Not on file  Social Connections: Not on file  Intimate Partner Violence: Not on file     ROS- All systems are reviewed and negative except as per the HPI above.  Physical Exam: Vitals:   01/11/23 1017  BP: (!) 140/80  Pulse: 71  Weight: 95.8 kg  Height: '5\' 3"'$  (1.6 m)    GEN- The patient is a well appearing elderly obese male, alert and oriented x 3 today.   Head- normocephalic, atraumatic Eyes-  Sclera clear, conjunctiva pink Ears- hearing intact Oropharynx- clear Neck- supple  Lungs- Clear to ausculation bilaterally, normal work of breathing Heart- Regular rate and rhythm, no murmurs, rubs or gallops  GI- soft, NT, ND, + BS Extremities- no clubbing, cyanosis, or edema MS- no significant deformity or atrophy Skin- no rash or lesion Psych- euthymic mood, full affect Neuro- strength and sensation are intact  Wt Readings from Last 3 Encounters:  01/11/23 95.8 kg  01/04/23 95 kg  12/07/22 94.5 kg    EKG today demonstrates  SR Vent. rate 71 BPM PR interval * ms QRS duration 78 ms QT/QTcB 396/430 ms  Echo 01/09/23 demonstrated   1. Left ventricular ejection fraction, by estimation, is 50 to 55%. The  left ventricle has low normal function. The left ventricle demonstrates  regional wall motion abnormalities (see scoring diagram/findings for  description). Left ventricular diastolic parameters are indeterminate.   2. Right ventricular systolic function is normal. The right ventricular  size is normal.   3. The mitral valve is normal in structure. Trivial mitral valve  regurgitation. No evidence of mitral stenosis.   4. The aortic valve is normal in structure.  Aortic valve regurgitation is  trivial. Aortic valve sclerosis/calcification is present, without any  evidence of aortic stenosis.   5. The inferior vena cava is normal in size with greater than 50%  respiratory variability, suggesting right atrial pressure of 3 mmHg.   Comparison(s): A prior study was performed on 04/05/22. No significant  change from prior study.   Epic records are reviewed at length today  CHA2DS2-VASc Score = 4  The patient's score is based upon: CHF History: 0 HTN History: 1 Diabetes History: 0 Stroke History: 0 Vascular Disease History: 1 Age Score: 2 Gender Score: 0       ASSESSMENT AND PLAN: 1. Paroxysmal Atrial Fibrillation (ICD10:  I48.0) The patient's CHA2DS2-VASc score is 4, indicating a 4.8% annual risk of stroke.   General education about afib provided and questions answered. We also discussed his stroke risk and the risks and benefits of anticoagulation. Continue Eliquis 5 mg BID Continue diltiazem 240 mg daily Continue Lopressor 50 mg BID We discussed rhythm control options. He is feeling well on the present doses of medication. We briefly discussed AAD if his afib becomes more frequent or persistent.   2. Secondary Hypercoagulable State (ICD10:  D68.69) The patient is at significant risk for stroke/thromboembolism based upon his CHA2DS2-VASc Score of 4.  Continue Apixaban (Eliquis).   3. Obesity Body mass index is 37.41 kg/m. Lifestyle modification was discussed at length including regular exercise and weight reduction.  4. CAD S/p CABG 2001 No anginal symptoms.  5. HTN Stable, no changes today.   Follow up with Warren Lacy PA as scheduled. AF clinic in 6 months.    [facility] [address] [place], [place] [address] [phone] 01/11/2023 11:04 AM

## 2023-01-17 DIAGNOSIS — E7800 Pure hypercholesterolemia, unspecified: Secondary | ICD-10-CM | POA: Diagnosis not present

## 2023-01-17 DIAGNOSIS — I10 Essential (primary) hypertension: Secondary | ICD-10-CM | POA: Diagnosis not present

## 2023-01-17 DIAGNOSIS — I2510 Atherosclerotic heart disease of native coronary artery without angina pectoris: Secondary | ICD-10-CM | POA: Diagnosis not present

## 2023-01-17 DIAGNOSIS — I4891 Unspecified atrial fibrillation: Secondary | ICD-10-CM | POA: Diagnosis not present

## 2023-01-17 DIAGNOSIS — J45909 Unspecified asthma, uncomplicated: Secondary | ICD-10-CM | POA: Diagnosis not present

## 2023-01-17 DIAGNOSIS — I252 Old myocardial infarction: Secondary | ICD-10-CM | POA: Diagnosis not present

## 2023-01-17 NOTE — Telephone Encounter (Signed)
Left message for patient to call back  

## 2023-01-20 DIAGNOSIS — I10 Essential (primary) hypertension: Secondary | ICD-10-CM | POA: Diagnosis not present

## 2023-01-20 DIAGNOSIS — E7800 Pure hypercholesterolemia, unspecified: Secondary | ICD-10-CM | POA: Diagnosis not present

## 2023-01-20 DIAGNOSIS — I2510 Atherosclerotic heart disease of native coronary artery without angina pectoris: Secondary | ICD-10-CM | POA: Diagnosis not present

## 2023-01-20 DIAGNOSIS — J45909 Unspecified asthma, uncomplicated: Secondary | ICD-10-CM | POA: Diagnosis not present

## 2023-01-20 DIAGNOSIS — I4891 Unspecified atrial fibrillation: Secondary | ICD-10-CM | POA: Diagnosis not present

## 2023-01-20 DIAGNOSIS — I252 Old myocardial infarction: Secondary | ICD-10-CM | POA: Diagnosis not present

## 2023-01-22 DIAGNOSIS — Z6834 Body mass index (BMI) 34.0-34.9, adult: Secondary | ICD-10-CM | POA: Diagnosis not present

## 2023-01-22 DIAGNOSIS — F341 Dysthymic disorder: Secondary | ICD-10-CM | POA: Diagnosis not present

## 2023-01-22 DIAGNOSIS — J42 Unspecified chronic bronchitis: Secondary | ICD-10-CM | POA: Diagnosis not present

## 2023-01-22 DIAGNOSIS — M1710 Unilateral primary osteoarthritis, unspecified knee: Secondary | ICD-10-CM | POA: Diagnosis not present

## 2023-01-22 DIAGNOSIS — J45909 Unspecified asthma, uncomplicated: Secondary | ICD-10-CM | POA: Diagnosis not present

## 2023-01-22 DIAGNOSIS — M719 Bursopathy, unspecified: Secondary | ICD-10-CM | POA: Diagnosis not present

## 2023-01-22 DIAGNOSIS — I10 Essential (primary) hypertension: Secondary | ICD-10-CM | POA: Diagnosis not present

## 2023-01-22 DIAGNOSIS — K5120 Ulcerative (chronic) proctitis without complications: Secondary | ICD-10-CM | POA: Diagnosis not present

## 2023-01-22 DIAGNOSIS — K219 Gastro-esophageal reflux disease without esophagitis: Secondary | ICD-10-CM | POA: Diagnosis not present

## 2023-01-22 DIAGNOSIS — I4891 Unspecified atrial fibrillation: Secondary | ICD-10-CM | POA: Diagnosis not present

## 2023-01-22 DIAGNOSIS — I2510 Atherosclerotic heart disease of native coronary artery without angina pectoris: Secondary | ICD-10-CM | POA: Diagnosis not present

## 2023-01-22 DIAGNOSIS — E7800 Pure hypercholesterolemia, unspecified: Secondary | ICD-10-CM | POA: Diagnosis not present

## 2023-01-22 DIAGNOSIS — Z7951 Long term (current) use of inhaled steroids: Secondary | ICD-10-CM | POA: Diagnosis not present

## 2023-01-22 DIAGNOSIS — H353220 Exudative age-related macular degeneration, left eye, stage unspecified: Secondary | ICD-10-CM | POA: Diagnosis not present

## 2023-01-22 DIAGNOSIS — Z9181 History of falling: Secondary | ICD-10-CM | POA: Diagnosis not present

## 2023-01-22 DIAGNOSIS — I252 Old myocardial infarction: Secondary | ICD-10-CM | POA: Diagnosis not present

## 2023-01-28 DIAGNOSIS — H353220 Exudative age-related macular degeneration, left eye, stage unspecified: Secondary | ICD-10-CM | POA: Diagnosis not present

## 2023-01-28 DIAGNOSIS — K5120 Ulcerative (chronic) proctitis without complications: Secondary | ICD-10-CM | POA: Diagnosis not present

## 2023-01-28 DIAGNOSIS — I4891 Unspecified atrial fibrillation: Secondary | ICD-10-CM | POA: Diagnosis not present

## 2023-01-28 DIAGNOSIS — I10 Essential (primary) hypertension: Secondary | ICD-10-CM | POA: Diagnosis not present

## 2023-01-28 DIAGNOSIS — J42 Unspecified chronic bronchitis: Secondary | ICD-10-CM | POA: Diagnosis not present

## 2023-01-29 DIAGNOSIS — H402232 Chronic angle-closure glaucoma, bilateral, moderate stage: Secondary | ICD-10-CM | POA: Diagnosis not present

## 2023-01-29 DIAGNOSIS — G453 Amaurosis fugax: Secondary | ICD-10-CM | POA: Diagnosis not present

## 2023-01-29 DIAGNOSIS — H35373 Puckering of macula, bilateral: Secondary | ICD-10-CM | POA: Diagnosis not present

## 2023-01-29 DIAGNOSIS — H353132 Nonexudative age-related macular degeneration, bilateral, intermediate dry stage: Secondary | ICD-10-CM | POA: Diagnosis not present

## 2023-01-30 DIAGNOSIS — R269 Unspecified abnormalities of gait and mobility: Secondary | ICD-10-CM | POA: Diagnosis not present

## 2023-01-30 DIAGNOSIS — D649 Anemia, unspecified: Secondary | ICD-10-CM | POA: Diagnosis not present

## 2023-01-30 DIAGNOSIS — I10 Essential (primary) hypertension: Secondary | ICD-10-CM | POA: Diagnosis not present

## 2023-01-30 DIAGNOSIS — Z1389 Encounter for screening for other disorder: Secondary | ICD-10-CM | POA: Diagnosis not present

## 2023-01-30 DIAGNOSIS — J45909 Unspecified asthma, uncomplicated: Secondary | ICD-10-CM | POA: Diagnosis not present

## 2023-01-30 DIAGNOSIS — K219 Gastro-esophageal reflux disease without esophagitis: Secondary | ICD-10-CM | POA: Diagnosis not present

## 2023-01-30 DIAGNOSIS — K5100 Ulcerative (chronic) pancolitis without complications: Secondary | ICD-10-CM | POA: Diagnosis not present

## 2023-01-30 DIAGNOSIS — I252 Old myocardial infarction: Secondary | ICD-10-CM | POA: Diagnosis not present

## 2023-01-30 DIAGNOSIS — I4891 Unspecified atrial fibrillation: Secondary | ICD-10-CM | POA: Diagnosis not present

## 2023-02-04 ENCOUNTER — Encounter (INDEPENDENT_AMBULATORY_CARE_PROVIDER_SITE_OTHER): Payer: Medicare Other | Admitting: Ophthalmology

## 2023-02-04 DIAGNOSIS — H43813 Vitreous degeneration, bilateral: Secondary | ICD-10-CM | POA: Diagnosis not present

## 2023-02-04 DIAGNOSIS — H353112 Nonexudative age-related macular degeneration, right eye, intermediate dry stage: Secondary | ICD-10-CM | POA: Diagnosis not present

## 2023-02-04 DIAGNOSIS — I10 Essential (primary) hypertension: Secondary | ICD-10-CM

## 2023-02-04 DIAGNOSIS — H353221 Exudative age-related macular degeneration, left eye, with active choroidal neovascularization: Secondary | ICD-10-CM | POA: Diagnosis not present

## 2023-02-04 DIAGNOSIS — H35033 Hypertensive retinopathy, bilateral: Secondary | ICD-10-CM | POA: Diagnosis not present

## 2023-02-06 DIAGNOSIS — K5120 Ulcerative (chronic) proctitis without complications: Secondary | ICD-10-CM | POA: Diagnosis not present

## 2023-02-06 DIAGNOSIS — J42 Unspecified chronic bronchitis: Secondary | ICD-10-CM | POA: Diagnosis not present

## 2023-02-06 DIAGNOSIS — H353220 Exudative age-related macular degeneration, left eye, stage unspecified: Secondary | ICD-10-CM | POA: Diagnosis not present

## 2023-02-06 DIAGNOSIS — I10 Essential (primary) hypertension: Secondary | ICD-10-CM | POA: Diagnosis not present

## 2023-02-06 DIAGNOSIS — I4891 Unspecified atrial fibrillation: Secondary | ICD-10-CM | POA: Diagnosis not present

## 2023-02-07 NOTE — Telephone Encounter (Signed)
Patient given echo results.

## 2023-02-11 DIAGNOSIS — J42 Unspecified chronic bronchitis: Secondary | ICD-10-CM | POA: Diagnosis not present

## 2023-02-11 DIAGNOSIS — K5120 Ulcerative (chronic) proctitis without complications: Secondary | ICD-10-CM | POA: Diagnosis not present

## 2023-02-11 DIAGNOSIS — H353220 Exudative age-related macular degeneration, left eye, stage unspecified: Secondary | ICD-10-CM | POA: Diagnosis not present

## 2023-02-11 DIAGNOSIS — I4891 Unspecified atrial fibrillation: Secondary | ICD-10-CM | POA: Diagnosis not present

## 2023-02-11 DIAGNOSIS — I10 Essential (primary) hypertension: Secondary | ICD-10-CM | POA: Diagnosis not present

## 2023-02-12 ENCOUNTER — Other Ambulatory Visit (HOSPITAL_COMMUNITY): Payer: Self-pay | Admitting: Physician Assistant

## 2023-02-12 DIAGNOSIS — I480 Paroxysmal atrial fibrillation: Secondary | ICD-10-CM | POA: Diagnosis not present

## 2023-02-13 LAB — CBC
Hematocrit: 35.9 % — ABNORMAL LOW (ref 37.5–51.0)
Hemoglobin: 11.6 g/dL — ABNORMAL LOW (ref 13.0–17.7)
MCH: 29.9 pg (ref 26.6–33.0)
MCHC: 32.3 g/dL (ref 31.5–35.7)
MCV: 93 fL (ref 79–97)
Platelets: 245 10*3/uL (ref 150–450)
RBC: 3.88 x10E6/uL — ABNORMAL LOW (ref 4.14–5.80)
RDW: 14.9 % (ref 11.6–15.4)
WBC: 5.8 10*3/uL (ref 3.4–10.8)

## 2023-02-17 ENCOUNTER — Other Ambulatory Visit: Payer: Self-pay | Admitting: Internal Medicine

## 2023-02-17 DIAGNOSIS — J42 Unspecified chronic bronchitis: Secondary | ICD-10-CM | POA: Diagnosis not present

## 2023-02-17 DIAGNOSIS — I10 Essential (primary) hypertension: Secondary | ICD-10-CM | POA: Diagnosis not present

## 2023-02-17 DIAGNOSIS — K5120 Ulcerative (chronic) proctitis without complications: Secondary | ICD-10-CM | POA: Diagnosis not present

## 2023-02-17 DIAGNOSIS — I4891 Unspecified atrial fibrillation: Secondary | ICD-10-CM | POA: Diagnosis not present

## 2023-02-17 DIAGNOSIS — H353220 Exudative age-related macular degeneration, left eye, stage unspecified: Secondary | ICD-10-CM | POA: Diagnosis not present

## 2023-03-03 ENCOUNTER — Other Ambulatory Visit: Payer: Self-pay | Admitting: Cardiovascular Disease

## 2023-03-05 DIAGNOSIS — H402232 Chronic angle-closure glaucoma, bilateral, moderate stage: Secondary | ICD-10-CM | POA: Diagnosis not present

## 2023-03-12 NOTE — Progress Notes (Unsigned)
Office Visit    Patient Name: Barry [name] Date of Encounter: 03/13/2023  PCP:  [name], [facility]  Cardiologist:  Jenkins Rouge, MD  Advanced Practice Provider:  No care team member to display Electrophysiologist:  None   HPI    Barry [name] is a 77 y.o. male with a past medical history significant for atrial fibrillation, CAD status post CABG 2001, asthma, hypertension, hyperlipidemia presents today for follow-up appointment.  Was previously patient of Dr. Wynonia Lawman.  Sees pulmonology for paralyzed left hemidiaphragm.  He has history of CAD with CABG back in August 2001.  He has UC and requires sulfasalazine and steroids.  He uses a walker for some lower extremity orthopedic issues.  He was seen 03/2022 and was having no angina but some fatigue and dyspnea.  Echocardiogram 10/2019 was done for fatigue which showed EF of 55% with no pulmonary hypertension and AV sclerosis.  His caregiver asked if he could have orthopedic surgery.  He was last seen for cardiac clearance 08/06/2022.  He was being cleared for left knee surgery.  The plan was to schedule a Lexiscan Myoview prior to clearance.  After questioning he has scored a 4.06 on the DASI.  He was unable to walk 2 blocks due to his knee.  He had not had any chest pain, shortness of breath, or palpitations.  He was taking Mobic for his NSAID and was not taking aspirin.  He was seen by me January 2024 and  presented to the clinic for further workup of new onset atrial fibrillation.  He was started on Eliquis by Dr. Admission 4 weeks ago and has been compliant with his medication.  He has not had any significant symptoms.  He is in normal sinus rhythm today on exam.  He has follow-up with the A-fib clinic next week as well as a scheduled echocardiogram on Wednesday.  We went through some options for his atrial fibrillation including medication management, DCCV, and ablation.  He was wondering if his syncopal  episode had anything to do with atrial fibrillation.  Not technically documented until his follow-up appointment with his primary care the following day.    He was seen by the Afib clinic and was in NSR at that time.  His CHA2DS2-VASc score is 4.  Eliquis was continued 5 mg twice daily.  Today, he feels okay without further symptoms. He has not had any palpitations, chest pains, or SOB.  He tells me he is already seen by [place] for knee replacement surgery.  At that time during the workup he was found to be in atrial fibrillation.  He then saw me in January and kept his appointment with the A-fib clinic.  He has been in normal sinus rhythm without any issues since then.  Since he had a syncopal episode.  He was advised not to drive for a total of 6 months.  He will see the A-fib clinic around 9 and they may be able to provide clearance.  Otherwise, no medication changes today.  Tolerating Eliquis well without any bleeding.  Reports no shortness of breath nor dyspnea on exertion. Reports no chest pain, pressure, or tightness. No edema, orthopnea, PND. Reports no palpitations.   Past Medical History    Past Medical History:  Diagnosis Date   A-fib [facility])    Acute sinusitis 03/24/2012   Followed in Pulmonary clinic/ [facility]/ [name] - CT sinus 08/07/2013  Short air-fluid levels in the  maxillary sinuses bilaterally suggesting early acute sinusitis. - repeat ct sinus 04/30/2014 > ok     Acute sinusitis, unspecified 08/14/2010   Qualifier: Diagnosis of  By: [name], [name]     Asthma    PFTs 06/15/05 FEV1 85% predicted ratio 68% and truncation of resp loop in a sawtooth pattern. HFA 25% 08-01-2009 >50% Nov 01, 2009>50% 01-31-2010   Asthma, chronic 11/24/2007   Followed in Pulmonary clinic/ [facility]/ [name]  - PFTs 06/15/2005 FEV1 85% predicted ratio 68% and truncation of respiratory loop in a sawtooth pattern  - HFA 25% August 01, 2009 > 50% November 01, 2009 > 50% January 31, 2010 therefore  changed to neb bud/brovana - HFA 75% p extensive coaching 08/07/2013  > insurance issues so try symbicort 160 2 bid instead of neb - 11/16/2015   p extensive   BRONCHITIS, ACUTE 01/22/2011   Qualifier: Diagnosis of  By: [name], [name]     CAD (coronary artery disease), native coronary artery 02/06/2019   S/p CABG 99991111 complicated by paralyzed left hemidiaphram   Cataract    Chronic cough    Chronic rhinitis 05/02/2009   Followed in Pulmonary clinic/ [facility]/ [name]    - 03/24/12 CT Sinus > Negative paranasal sinuses - Sinus CT 08/07/2013 >>Short air-fluid levels in the maxillary sinuses bilaterally suggesting early acute sinusitis - Repeat augmentin x 21 days 03/27/2014 then sinus ct if not better  - flonase/afrin regimen 02/11/15 >>>      Coronary heart disease    COUGH, CHRONIC 11/24/2007   Followed in Pulmonary clinic/ [facility]/ [name]      DDD (degenerative disc disease), lumbar    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   Followed in Pulmonary clinic/ [facility]/ [name]  -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     Excessive ear wax, bilateral 09/15/2018   GASTROESOPHAGEAL REFLUX DISEASE 08/27/2008   Followed as Primary Care Patient/ GI/ Dr  Earle Gell     Glaucoma    Hyperlipidemia LDL goal <70 02/06/2019   Hypertension    Obesity    Osteoarthritis    Paralyzed hemidiaphragm    left, 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   Retina disorder 2014   Severe obesity (BMI >= 40) ([place]) 11/24/2007       - Target wt < 191 to get under BMI 30    Ulcerative colitis    ULCERATIVE COLITIS 11/24/2007   Followed as Primary Care Patient/ GI/ Dr  Earle Gell     URI 04/09/2008   Qualifier: Diagnosis of  By: [name], [name]     Past Surgical History:  Procedure Laterality Date   CARDIAC CATHETERIZATION Left    COLONOSCOPY WITH PROPOFOL N/A 11/29/2015   Procedure: COLONOSCOPY WITH PROPOFOL;  Surgeon: [name], MD;  Location: [facility];  Service: Endoscopy;  Laterality: N/A;    CORONARY ARTERY BYPASS GRAFT     x6    Allergies  Allergies  Allergen Reactions   Piroxicam Itching and Rash    EKGs/Labs/Other Studies Reviewed:   The following studies were reviewed today:  Echocardiogram 04/05/22  IMPRESSIONS     1. Left ventricular ejection fraction, by estimation, is 50 to 55%. The  left ventricle has low normal function. The left ventricle demonstrates  regional wall motion abnormalities (see scoring diagram/findings for  description). There is mild concentric  left ventricular hypertrophy. Left ventricular diastolic parameters are  indeterminate.   2. Right ventricular systolic function is normal. The right ventricular  size is normal. Tricuspid  regurgitation signal is inadequate for assessing  PA pressure.   3. The mitral valve is normal in structure. Trivial mitral valve  regurgitation. No evidence of mitral stenosis.   4. The aortic valve is normal in structure. Aortic valve regurgitation is  mild. Aortic valve sclerosis is present, with no evidence of aortic valve  stenosis.   5. The inferior vena cava is normal in size with greater than 50%  respiratory variability, suggesting right atrial pressure of 3 mmHg.   Comparison(s): A prior study was performed on 11/06/2019. No significant  change from prior study.   EKG:  EKG is not ordered today.   Recent Labs: 12/07/2022: BUN 28; Creatinine, Ser 0.85; Potassium 4.4; Sodium 142 02/12/2023: Hemoglobin 11.6; Platelets 245  Recent Lipid Panel No results found for: "CHOL", "TRIG", "HDL", "CHOLHDL", "VLDL", "LDLCALC", "LDLDIRECT"  Risk Assessment/Calculations:   99991111 Score = 4  {Click here to calculate score.This indicates a 4.8% annual risk of stroke. The patient's score is based upon: CHF History: 0 HTN History: 1 Diabetes History: 0 Stroke History: 0 Vascular Disease History: 1 Age Score: 2 Gender Score: 0    Home Medications   Current Meds  Medication Sig   acetaminophen  (TYLENOL) 500 MG tablet Take per bottle as needed for arthritis   apixaban (ELIQUIS) 5 MG TABS tablet Take 1 tablet (5 mg total) by mouth 2 (two) times daily.   atorvastatin (LIPITOR) 40 MG tablet TAKE 1 TABLET BY MOUTH EVERY DAY   betamethasone dipropionate 0.05 % lotion Apply topically as needed. As directed   Bevacizumab (AVASTIN) 100 MG/4ML SOLN as directed Intravenous   budesonide-formoterol (SYMBICORT) 160-4.5 MCG/ACT inhaler TAKE 2 PUFFS BY MOUTH TWICE A DAY   carboxymethylcellulose (REFRESH PLUS) 0.5 % SOLN Use as needed for dry eye   Cholecalciferol (VITAMIN D) 50 MCG (2000 UT) tablet Take 2,000 Units by mouth daily.    cycloSPORINE (RESTASIS) 0.05 % ophthalmic emulsion Place 1 drop into both eyes 2 (two) times daily.   diltiazem (CARDIZEM CD) 240 MG 24 hr capsule TAKE 1 CAPSULE BY MOUTH EVERY DAY   docusate sodium (COLACE) 100 MG capsule Take 100 mg by mouth daily as needed for mild constipation.   escitalopram (LEXAPRO) 10 MG tablet Take by mouth daily at 6 (six) AM.   Eyelid Cleansers (OCUSOFT EYELID CLEANSING) PADS Apply topically as needed. Use daily   ezetimibe (ZETIA) 10 MG tablet TAKE 1 TABLET BY MOUTH EVERY DAY   Ferrous Sulfate (SLOW FE) 142 (45 Fe) MG TBCR 1 tablet Orally Once a day   fexofenadine (ALLEGRA) 180 MG tablet Take 180 mg by mouth as needed for allergies.   fluticasone (FLONASE) 50 MCG/ACT nasal spray Place 2 sprays into both nostrils 2 (two) times daily.    latanoprost (XALATAN) 0.005 % ophthalmic solution Place 1 drop into both eyes at bedtime.   losartan (COZAAR) 25 MG tablet TAKE 1 TABLET BY MOUTH EVERY DAY   meloxicam (MOBIC) 15 MG tablet Take 15 mg by mouth daily.   metoprolol tartrate (LOPRESSOR) 50 MG tablet TAKE 1 TABLET BY MOUTH TWICE A DAY   montelukast (SINGULAIR) 10 MG tablet TAKE ONE TABLET BY MOUTH AT BEDTIME EVERY NIGHT   Multiple Vitamins-Minerals (CENTRUM SILVER PO) Take 1 tablet by mouth daily.   nitroGLYCERIN (NITROSTAT) 0.4 MG SL tablet Place  1 tablet (0.4 mg total) under the tongue every 5 (five) minutes as needed for chest pain.   omeprazole (PRILOSEC) 20 MG capsule Take 20 mg by mouth 2 (  two) times daily before a meal.   Polyethyl Glyc-Propyl Glyc PF 0.4-0.3 % SOLN Apply 1 drop to eye as needed.   Pseudoephedrine-Ibuprofen 30-200 MG TABS Per bottle as needed for sinus pressure/pain   Spacer/Aero-Holding Chambers (AEROCHAMBER MV) inhaler Use as instructed   sulfaSALAzine (AZULFIDINE) 500 MG tablet Take 1,000 mg by mouth 4 (four) times daily.   VENTOLIN HFA 108 (90 Base) MCG/ACT inhaler INHALE 2 PUFFS INTO THE LUNGS EVERY 4 HRS AS NEEDED FOR WHEEZING OR SHORTNESS OF BREATH     Review of Systems      All other systems reviewed and are otherwise negative except as noted above.  Physical Exam    VS:  BP 138/62   Pulse 90   Ht 5\' 3"  (1.6 m)   Wt 210 lb 6.4 oz (95.4 kg)   SpO2 96%   BMI 37.27 kg/m  , BMI Body mass index is 37.27 kg/m.  Wt Readings from Last 3 Encounters:  03/13/23 210 lb 6.4 oz (95.4 kg)  01/11/23 211 lb 3.2 oz (95.8 kg)  01/04/23 209 lb 6.4 oz (95 kg)     GEN: Well nourished, well developed, in no acute distress. HEENT: normal. Neck: Supple, no JVD, carotid bruits, or masses. Cardiac: RRR, no murmurs, rubs, or gallops. No clubbing, cyanosis, edema.  Radials/PT 2+ and equal bilaterally.  Respiratory:  Respirations regular and unlabored, clear to auscultation bilaterally. GI: Soft, nontender, nondistended. MS: No deformity or atrophy. Skin: Warm and dry, no rash. Neuro:  Strength and sensation are intact. Psych: Normal affect.  Assessment & Plan    New onset Afib 12/23 with syncope -no further syncope -Continue Eliquis 5 mg twice daily -Continue metoprolol 50mg  BID -continue diltiazem 240 mg daily -He is in normal sinus rhythm today in the 90s -echo reviewed today with the patient -he plans to re-evaluate knee surgery after his follow-up with the Afib clinic in July  CAD/CABG 2001 -No chest  pain -Would continue with current medications  Hypertension -Well-controlled here in the clinic -No medication changes today -Continue to monitor at home -Hypotension has been an issue in the past  Hyperlipidemia -LDL 113 which is slightly above goal -Continue Zetia 10 mg daily and Lipitor 40 mg daily (this may need increased if remains high at next visit) -LDL goal less than 70 due to history of CAD -repeat lipid panel at next office visit     Disposition: Follow up 6 months with Jenkins Rouge, MD or APP.  Signed, Elgie Collard, PA-C 03/13/2023, 11:57 AM if they cleared you at that [place]

## 2023-03-13 ENCOUNTER — Encounter: Payer: Self-pay | Admitting: Physician Assistant

## 2023-03-13 ENCOUNTER — Ambulatory Visit: Payer: Medicare Other | Attending: Physician Assistant | Admitting: Physician Assistant

## 2023-03-13 VITALS — BP 138/62 | HR 90 | Ht 63.0 in | Wt 210.4 lb

## 2023-03-13 DIAGNOSIS — I10 Essential (primary) hypertension: Secondary | ICD-10-CM | POA: Diagnosis not present

## 2023-03-13 DIAGNOSIS — I2510 Atherosclerotic heart disease of native coronary artery without angina pectoris: Secondary | ICD-10-CM | POA: Diagnosis not present

## 2023-03-13 DIAGNOSIS — Z951 Presence of aortocoronary bypass graft: Secondary | ICD-10-CM | POA: Diagnosis not present

## 2023-03-13 DIAGNOSIS — I25810 Atherosclerosis of coronary artery bypass graft(s) without angina pectoris: Secondary | ICD-10-CM | POA: Diagnosis not present

## 2023-03-13 DIAGNOSIS — E785 Hyperlipidemia, unspecified: Secondary | ICD-10-CM | POA: Diagnosis not present

## 2023-03-13 DIAGNOSIS — I480 Paroxysmal atrial fibrillation: Secondary | ICD-10-CM | POA: Insufficient documentation

## 2023-03-13 NOTE — Patient Instructions (Addendum)
Medication Instructions:  Your physician recommends that you continue on your current medications as directed. Please refer to the Current Medication list given to you today.  *If you need a refill on your cardiac medications before your next appointment, please call your pharmacy*  Lab Work: None If you have labs (blood work) drawn today and your tests are completely normal, you will receive your results only by: [place] (if you have MyChart) OR A paper copy in the mail If you have any lab test that is abnormal or we need to change your treatment, we will call you to review the results.  Follow-Up: At [facility], you and your health needs are our priority.  As part of our continuing mission to provide you with exceptional heart care, we have created designated Provider Care Teams.  These Care Teams include your primary Cardiologist (physician) and Advanced Practice Providers (APPs -  Physician Assistants and Nurse Practitioners) who all work together to provide you with the care you need, when you need it.  We recommend signing up for the patient portal called "MyChart".  Sign up information is provided on this After Visit Summary.  MyChart is used to connect with patients for Virtual Visits (Telemedicine).  Patients are able to view lab/test results, encounter notes, upcoming appointments, etc.  Non-urgent messages can be sent to your provider as well.   To learn more about what you can do with MyChart, go to [email or web address].    Your next appointment:   6 month(s)  Provider:   Jenkins Rouge, MD    Keep appointment with the atrial fibrillation clinic as scheduled  Low-Sodium Eating Plan Sodium, which is an element that makes up salt, helps you maintain a healthy balance of fluids in your body. Too much sodium can increase your blood pressure and cause fluid and waste to be held in your body. Your health care provider or dietitian may recommend following this plan  if you have high blood pressure (hypertension), kidney disease, liver disease, or heart failure. Eating less sodium can help lower your blood pressure, reduce swelling, and protect your heart, liver, and kidneys. What are tips for following this plan? Reading food labels The Nutrition Facts label lists the amount of sodium in one serving of the food. If you eat more than one serving, you must multiply the listed amount of sodium by the number of servings. Choose foods with less than 140 mg of sodium per serving. Avoid foods with 300 mg of sodium or more per serving. Shopping  Look for lower-sodium products, often labeled as "low-sodium" or "no salt added." Always check the sodium content, even if foods are labeled as "unsalted" or "no salt added." Buy fresh foods. Avoid canned foods and pre-made or frozen meals. Avoid canned, cured, or processed meats. Buy breads that have less than 80 mg of sodium per slice. Cooking  Eat more home-cooked food and less restaurant, buffet, and fast food. Avoid adding salt when cooking. Use salt-free seasonings or herbs instead of table salt or sea salt. Check with your health care provider or pharmacist before using salt substitutes. Cook with plant-based oils, such as canola, sunflower, or olive oil. Meal planning When eating at a restaurant, ask that your food be prepared with less salt or no salt, if possible. Avoid dishes labeled as brined, pickled, cured, smoked, or made with soy sauce, miso, or teriyaki sauce. Avoid foods that contain MSG (monosodium glutamate). MSG is sometimes added to [place] food, bouillon, and  some canned foods. Make meals that can be grilled, baked, poached, roasted, or steamed. These are generally made with less sodium. General information Most people on this plan should limit their sodium intake to 1,500-2,000 mg (milligrams) of sodium each day. What foods should I eat? Fruits Fresh, frozen, or canned fruit. Fruit  juice. Vegetables Fresh or frozen vegetables. "No salt added" canned vegetables. "No salt added" tomato sauce and paste. Low-sodium or reduced-sodium tomato and vegetable juice. Grains Low-sodium cereals, including oats, puffed wheat and rice, and shredded wheat. Low-sodium crackers. Unsalted rice. Unsalted pasta. Low-sodium bread. Whole-grain breads and whole-grain pasta. Meats and other proteins Fresh or frozen (no salt added) meat, poultry, seafood, and fish. Low-sodium canned tuna and salmon. Unsalted nuts. Dried peas, beans, and lentils without added salt. Unsalted canned beans. Eggs. Unsalted nut butters. Dairy Milk. Soy milk. Cheese that is naturally low in sodium, such as ricotta cheese, fresh mozzarella, or Swiss cheese. Low-sodium or reduced-sodium cheese. Cream cheese. Yogurt. Seasonings and condiments Fresh and dried herbs and spices. Salt-free seasonings. Low-sodium mustard and ketchup. Sodium-free salad dressing. Sodium-free light mayonnaise. Fresh or refrigerated horseradish. Lemon juice. Vinegar. Other foods Homemade, reduced-sodium, or low-sodium soups. Unsalted popcorn and pretzels. Low-salt or salt-free chips. The items listed above may not be a complete list of foods and beverages you can eat. Contact a dietitian for more information. What foods should I avoid? Vegetables Sauerkraut, pickled vegetables, and relishes. Olives. [place] fries. Onion rings. Regular canned vegetables (not low-sodium or reduced-sodium). Regular canned tomato sauce and paste (not low-sodium or reduced-sodium). Regular tomato and vegetable juice (not low-sodium or reduced-sodium). Frozen vegetables in sauces. Grains Instant hot cereals. Bread stuffing, pancake, and biscuit mixes. Croutons. Seasoned rice or pasta mixes. Noodle soup cups. Boxed or frozen macaroni and cheese. Regular salted crackers. Self-rising flour. Meats and other proteins Meat or fish that is salted, canned, smoked, spiced, or  pickled. Precooked or cured meat, such as sausages or meat loaves. [place] Salines. Ham. Pepperoni. Hot dogs. Corned beef. Chipped beef. Salt pork. Jerky. Pickled herring. Anchovies and sardines. Regular canned tuna. Salted nuts. Dairy Processed cheese and cheese spreads. Hard cheeses. Cheese curds. Blue cheese. Feta cheese. String cheese. Regular cottage cheese. Buttermilk. Canned milk. Fats and oils Salted butter. Regular margarine. Ghee. Bacon fat. Seasonings and condiments Onion salt, garlic salt, seasoned salt, table salt, and sea salt. Canned and packaged gravies. Worcestershire sauce. Tartar sauce. Barbecue sauce. Teriyaki sauce. Soy sauce, including reduced-sodium. Steak sauce. Fish sauce. Oyster sauce. Cocktail sauce. Horseradish that you find on the shelf. Regular ketchup and mustard. Meat flavorings and tenderizers. Bouillon cubes. Hot sauce. Pre-made or packaged marinades. Pre-made or packaged taco seasonings. Relishes. Regular salad dressings. Salsa. Other foods Salted popcorn and pretzels. Corn chips and puffs. Potato and tortilla chips. Canned or dried soups. Pizza. Frozen entrees and pot pies. The items listed above may not be a complete list of foods and beverages you should avoid. Contact a dietitian for more information. Summary Eating less sodium can help lower your blood pressure, reduce swelling, and protect your heart, liver, and kidneys. Most people on this plan should limit their sodium intake to 1,500-2,000 mg (milligrams) of sodium each day. Canned, boxed, and frozen foods are high in sodium. Restaurant foods, fast foods, and pizza are also very high in sodium. You also get sodium by adding salt to food. Try to cook at home, eat more fresh fruits and vegetables, and eat less fast food and canned, processed, or prepared foods. This information is  not intended to replace advice given to you by your health care provider. Make sure you discuss any questions you have with your health care  provider. Document Revised: 11/09/2019 Document Reviewed: 11/04/2019 Elsevier Patient Education  [place] Many factors influence your heart health, including eating and exercise habits. Heart health is also called coronary health. Coronary risk increases with abnormal blood fat (lipid) levels. A heart-healthy eating plan includes limiting unhealthy fats, increasing healthy fats, limiting salt (sodium) intake, and making other diet and lifestyle changes. What is my plan? Your health care provider may recommend that: You limit your fat intake to _________% or less of your total calories each day. You limit your saturated fat intake to _________% or less of your total calories each day. You limit the amount of cholesterol in your diet to less than _________ mg per day. You limit the amount of sodium in your diet to less than _________ mg per day. What are tips for following this plan? Cooking Cook foods using methods other than frying. Baking, boiling, grilling, and broiling are all good options. Other ways to reduce fat include: Removing the skin from poultry. Removing all visible fats from meats. Steaming vegetables in water or broth. Meal planning  At meals, imagine dividing your plate into fourths: Fill one-half of your plate with vegetables and green salads. Fill one-fourth of your plate with whole grains. Fill one-fourth of your plate with lean protein foods. Eat 2-4 cups of vegetables per day. One cup of vegetables equals 1 cup (91 g) broccoli or cauliflower florets, 2 medium carrots, 1 large bell pepper, 1 large sweet potato, 1 large tomato, 1 medium white potato, 2 cups (150 g) raw leafy greens. Eat 1-2 cups of fruit per day. One cup of fruit equals 1 small apple, 1 large banana, 1 cup (237 g) mixed fruit, 1 large orange,  cup (82 g) dried fruit, 1 cup (240 mL) 100% fruit juice. Eat more foods that contain soluble fiber. Examples include  apples, broccoli, carrots, beans, peas, and barley. Aim to get 25-30 g of fiber per day. Increase your consumption of legumes, nuts, and seeds to 4-5 servings per week. One serving of dried beans or legumes equals  cup (90 g) cooked, 1 serving of nuts is  oz (12 almonds, 24 pistachios, or 7 walnut halves), and 1 serving of seeds equals  oz (8 g). Fats Choose healthy fats more often. Choose monounsaturated and polyunsaturated fats, such as olive and canola oils, avocado oil, flaxseeds, walnuts, almonds, and seeds. Eat more omega-3 fats. Choose salmon, mackerel, sardines, tuna, flaxseed oil, and ground flaxseeds. Aim to eat fish at least 2 times each week. Check food labels carefully to identify foods with trans fats or high amounts of saturated fat. Limit saturated fats. These are found in animal products, such as meats, butter, and cream. Plant sources of saturated fats include palm oil, palm kernel oil, and coconut oil. Avoid foods with partially hydrogenated oils in them. These contain trans fats. Examples are stick margarine, some tub margarines, cookies, crackers, and other baked goods. Avoid fried foods. General information Eat more home-cooked food and less restaurant, buffet, and fast food. Limit or avoid alcohol. Limit foods that are high in added sugar and simple starches such as foods made using white refined flour (white breads, pastries, sweets). Lose weight if you are overweight. Losing just 5-10% of your body weight can help your overall health and prevent diseases such as diabetes and  heart disease. Monitor your sodium intake, especially if you have high blood pressure. Talk with your health care provider about your sodium intake. Try to incorporate more vegetarian meals weekly. What foods should I eat? Fruits All fresh, canned (in natural juice), or frozen fruits. Vegetables Fresh or frozen vegetables (raw, steamed, roasted, or grilled). Green salads. Grains Most grains.  Choose whole wheat and whole grains most of the time. Rice and pasta, including brown rice and pastas made with whole wheat. Meats and other proteins Lean, well-trimmed beef, veal, pork, and lamb. Chicken and [place] without skin. All fish and shellfish. Wild duck, rabbit, pheasant, and venison. Egg whites or low-cholesterol egg substitutes. Dried beans, peas, lentils, and tofu. Seeds and most nuts. Dairy Low-fat or nonfat cheeses, including ricotta and mozzarella. Skim or 1% milk (liquid, powdered, or evaporated). Buttermilk made with low-fat milk. Nonfat or low-fat yogurt. Fats and oils Non-hydrogenated (trans-free) margarines. Vegetable oils, including soybean, sesame, sunflower, olive, avocado, peanut, safflower, corn, canola, and cottonseed. Salad dressings or mayonnaise made with a vegetable oil. Beverages Water (mineral or sparkling). Coffee and tea. Unsweetened ice tea. Diet beverages. Sweets and desserts Sherbet, gelatin, and fruit ice. Small amounts of dark chocolate. Limit all sweets and desserts. Seasonings and condiments All seasonings and condiments. The items listed above may not be a complete list of foods and beverages you can eat. Contact a dietitian for more options. What foods should I avoid? Fruits Canned fruit in heavy syrup. Fruit in cream or butter sauce. Fried fruit. Limit coconut. Vegetables Vegetables cooked in cheese, cream, or butter sauce. Fried vegetables. Grains Breads made with saturated or trans fats, oils, or whole milk. Croissants. Sweet rolls. Donuts. High-fat crackers, such as cheese crackers and chips. Meats and other proteins Fatty meats, such as hot dogs, ribs, sausage, bacon, rib-eye roast or steak. High-fat deli meats, such as salami and bologna. Caviar. Domestic duck and goose. Organ meats, such as liver. Dairy Cream, sour cream, cream cheese, and creamed cottage cheese. Whole-milk cheeses. Whole or 2% milk (liquid, evaporated, or condensed). Whole  buttermilk. Cream sauce or high-fat cheese sauce. Whole-milk yogurt. Fats and oils Meat fat, or shortening. Cocoa butter, hydrogenated oils, palm oil, coconut oil, palm kernel oil. Solid fats and shortenings, including bacon fat, salt pork, lard, and butter. Nondairy cream substitutes. Salad dressings with cheese or sour cream. Beverages Regular sodas and any drinks with added sugar. Sweets and desserts Frosting. Pudding. Cookies. Cakes. Pies. Milk chocolate or white chocolate. Buttered syrups. Full-fat ice cream or ice cream drinks. The items listed above may not be a complete list of foods and beverages to avoid. Contact a dietitian for more information. Summary Heart-healthy meal planning includes limiting unhealthy fats, increasing healthy fats, limiting salt (sodium) intake and making other diet and lifestyle changes. Lose weight if you are overweight. Losing just 5-10% of your body weight can help your overall health and prevent diseases such as diabetes and heart disease. Focus on eating a balance of foods, including fruits and vegetables, low-fat or nonfat dairy, lean protein, nuts and legumes, whole grains, and heart-healthy oils and fats. This information is not intended to replace advice given to you by your health care provider. Make sure you discuss any questions you have with your health care provider. Document Revised: 01/08/2022 Document Reviewed: 01/08/2022 Elsevier Patient Education  [place].

## 2023-04-11 DIAGNOSIS — N400 Enlarged prostate without lower urinary tract symptoms: Secondary | ICD-10-CM | POA: Diagnosis not present

## 2023-04-11 DIAGNOSIS — R319 Hematuria, unspecified: Secondary | ICD-10-CM | POA: Diagnosis not present

## 2023-04-15 ENCOUNTER — Encounter (INDEPENDENT_AMBULATORY_CARE_PROVIDER_SITE_OTHER): Payer: Medicare Other | Admitting: Ophthalmology

## 2023-04-22 ENCOUNTER — Encounter (INDEPENDENT_AMBULATORY_CARE_PROVIDER_SITE_OTHER): Payer: Medicare Other | Admitting: Ophthalmology

## 2023-04-22 DIAGNOSIS — I10 Essential (primary) hypertension: Secondary | ICD-10-CM

## 2023-04-22 DIAGNOSIS — H353221 Exudative age-related macular degeneration, left eye, with active choroidal neovascularization: Secondary | ICD-10-CM

## 2023-04-22 DIAGNOSIS — H43813 Vitreous degeneration, bilateral: Secondary | ICD-10-CM

## 2023-04-22 DIAGNOSIS — H35033 Hypertensive retinopathy, bilateral: Secondary | ICD-10-CM

## 2023-04-22 DIAGNOSIS — H353112 Nonexudative age-related macular degeneration, right eye, intermediate dry stage: Secondary | ICD-10-CM

## 2023-04-25 DIAGNOSIS — N390 Urinary tract infection, site not specified: Secondary | ICD-10-CM | POA: Diagnosis not present

## 2023-05-14 ENCOUNTER — Other Ambulatory Visit: Payer: Self-pay | Admitting: Cardiovascular Disease

## 2023-05-30 DIAGNOSIS — I4891 Unspecified atrial fibrillation: Secondary | ICD-10-CM | POA: Diagnosis not present

## 2023-05-30 DIAGNOSIS — J45909 Unspecified asthma, uncomplicated: Secondary | ICD-10-CM | POA: Diagnosis not present

## 2023-05-30 DIAGNOSIS — D649 Anemia, unspecified: Secondary | ICD-10-CM | POA: Diagnosis not present

## 2023-05-30 DIAGNOSIS — R197 Diarrhea, unspecified: Secondary | ICD-10-CM | POA: Diagnosis not present

## 2023-05-30 DIAGNOSIS — K5100 Ulcerative (chronic) pancolitis without complications: Secondary | ICD-10-CM | POA: Diagnosis not present

## 2023-05-30 DIAGNOSIS — I10 Essential (primary) hypertension: Secondary | ICD-10-CM | POA: Diagnosis not present

## 2023-05-30 DIAGNOSIS — F341 Dysthymic disorder: Secondary | ICD-10-CM | POA: Diagnosis not present

## 2023-05-30 DIAGNOSIS — R269 Unspecified abnormalities of gait and mobility: Secondary | ICD-10-CM | POA: Diagnosis not present

## 2023-06-03 ENCOUNTER — Emergency Department (HOSPITAL_COMMUNITY): Payer: Medicare Other

## 2023-06-03 ENCOUNTER — Other Ambulatory Visit: Payer: Self-pay

## 2023-06-03 ENCOUNTER — Encounter (HOSPITAL_COMMUNITY): Payer: Self-pay

## 2023-06-03 ENCOUNTER — Inpatient Hospital Stay (HOSPITAL_COMMUNITY)
Admission: EM | Admit: 2023-06-03 | Discharge: 2023-06-10 | DRG: 690 | Disposition: A | Discharge status: Skilled Nursing Facility | Payer: Medicare Other | Attending: Internal Medicine | Admitting: Internal Medicine

## 2023-06-03 DIAGNOSIS — N3001 Acute cystitis with hematuria: Principal | ICD-10-CM | POA: Diagnosis present

## 2023-06-03 DIAGNOSIS — Z791 Long term (current) use of non-steroidal anti-inflammatories (NSAID): Secondary | ICD-10-CM

## 2023-06-03 DIAGNOSIS — R319 Hematuria, unspecified: Secondary | ICD-10-CM | POA: Diagnosis not present

## 2023-06-03 DIAGNOSIS — E860 Dehydration: Secondary | ICD-10-CM | POA: Diagnosis present

## 2023-06-03 DIAGNOSIS — W1830XA Fall on same level, unspecified, initial encounter: Secondary | ICD-10-CM | POA: Diagnosis present

## 2023-06-03 DIAGNOSIS — Y92008 Other place in unspecified non-institutional (private) residence as the place of occurrence of the external cause: Secondary | ICD-10-CM | POA: Diagnosis not present

## 2023-06-03 DIAGNOSIS — L89322 Pressure ulcer of left buttock, stage 2: Secondary | ICD-10-CM | POA: Diagnosis not present

## 2023-06-03 DIAGNOSIS — Z79899 Other long term (current) drug therapy: Secondary | ICD-10-CM

## 2023-06-03 DIAGNOSIS — Z8249 Family history of ischemic heart disease and other diseases of the circulatory system: Secondary | ICD-10-CM | POA: Diagnosis not present

## 2023-06-03 DIAGNOSIS — N200 Calculus of kidney: Secondary | ICD-10-CM | POA: Diagnosis present

## 2023-06-03 DIAGNOSIS — Z7951 Long term (current) use of inhaled steroids: Secondary | ICD-10-CM

## 2023-06-03 DIAGNOSIS — F32A Depression, unspecified: Secondary | ICD-10-CM | POA: Diagnosis present

## 2023-06-03 DIAGNOSIS — L8990 Pressure ulcer of unspecified site, unspecified stage: Secondary | ICD-10-CM | POA: Diagnosis present

## 2023-06-03 DIAGNOSIS — J45909 Unspecified asthma, uncomplicated: Secondary | ICD-10-CM | POA: Diagnosis present

## 2023-06-03 DIAGNOSIS — R5381 Other malaise: Secondary | ICD-10-CM | POA: Diagnosis not present

## 2023-06-03 DIAGNOSIS — R55 Syncope and collapse: Secondary | ICD-10-CM | POA: Diagnosis not present

## 2023-06-03 DIAGNOSIS — R197 Diarrhea, unspecified: Secondary | ICD-10-CM

## 2023-06-03 DIAGNOSIS — R42 Dizziness and giddiness: Secondary | ICD-10-CM | POA: Diagnosis not present

## 2023-06-03 DIAGNOSIS — M25462 Effusion, left knee: Secondary | ICD-10-CM | POA: Diagnosis not present

## 2023-06-03 DIAGNOSIS — Z5181 Encounter for therapeutic drug level monitoring: Secondary | ICD-10-CM | POA: Diagnosis not present

## 2023-06-03 DIAGNOSIS — L89312 Pressure ulcer of right buttock, stage 2: Secondary | ICD-10-CM | POA: Diagnosis not present

## 2023-06-03 DIAGNOSIS — I951 Orthostatic hypotension: Secondary | ICD-10-CM | POA: Diagnosis not present

## 2023-06-03 DIAGNOSIS — R29898 Other symptoms and signs involving the musculoskeletal system: Secondary | ICD-10-CM | POA: Diagnosis not present

## 2023-06-03 DIAGNOSIS — M6281 Muscle weakness (generalized): Secondary | ICD-10-CM | POA: Diagnosis not present

## 2023-06-03 DIAGNOSIS — Z7401 Bed confinement status: Secondary | ICD-10-CM | POA: Diagnosis not present

## 2023-06-03 DIAGNOSIS — M25562 Pain in left knee: Secondary | ICD-10-CM | POA: Diagnosis not present

## 2023-06-03 DIAGNOSIS — D494 Neoplasm of unspecified behavior of bladder: Secondary | ICD-10-CM | POA: Diagnosis not present

## 2023-06-03 DIAGNOSIS — R41841 Cognitive communication deficit: Secondary | ICD-10-CM | POA: Diagnosis not present

## 2023-06-03 DIAGNOSIS — I4891 Unspecified atrial fibrillation: Secondary | ICD-10-CM

## 2023-06-03 DIAGNOSIS — E785 Hyperlipidemia, unspecified: Secondary | ICD-10-CM | POA: Diagnosis present

## 2023-06-03 DIAGNOSIS — N390 Urinary tract infection, site not specified: Secondary | ICD-10-CM | POA: Diagnosis present

## 2023-06-03 DIAGNOSIS — R58 Hemorrhage, not elsewhere classified: Secondary | ICD-10-CM | POA: Diagnosis not present

## 2023-06-03 DIAGNOSIS — Z9181 History of falling: Secondary | ICD-10-CM | POA: Diagnosis not present

## 2023-06-03 DIAGNOSIS — H409 Unspecified glaucoma: Secondary | ICD-10-CM | POA: Diagnosis not present

## 2023-06-03 DIAGNOSIS — K5190 Ulcerative colitis, unspecified, without complications: Secondary | ICD-10-CM | POA: Diagnosis not present

## 2023-06-03 DIAGNOSIS — Z951 Presence of aortocoronary bypass graft: Secondary | ICD-10-CM | POA: Diagnosis not present

## 2023-06-03 DIAGNOSIS — K5730 Diverticulosis of large intestine without perforation or abscess without bleeding: Secondary | ICD-10-CM | POA: Diagnosis not present

## 2023-06-03 DIAGNOSIS — N3289 Other specified disorders of bladder: Secondary | ICD-10-CM | POA: Diagnosis present

## 2023-06-03 DIAGNOSIS — R8289 Other abnormal findings on cytological and histological examination of urine: Secondary | ICD-10-CM | POA: Diagnosis not present

## 2023-06-03 DIAGNOSIS — K921 Melena: Secondary | ICD-10-CM | POA: Diagnosis not present

## 2023-06-03 DIAGNOSIS — Z905 Acquired absence of kidney: Secondary | ICD-10-CM | POA: Diagnosis not present

## 2023-06-03 DIAGNOSIS — R933 Abnormal findings on diagnostic imaging of other parts of digestive tract: Secondary | ICD-10-CM | POA: Diagnosis not present

## 2023-06-03 DIAGNOSIS — R2689 Other abnormalities of gait and mobility: Secondary | ICD-10-CM | POA: Diagnosis not present

## 2023-06-03 DIAGNOSIS — Z01810 Encounter for preprocedural cardiovascular examination: Secondary | ICD-10-CM

## 2023-06-03 DIAGNOSIS — R2681 Unsteadiness on feet: Secondary | ICD-10-CM | POA: Diagnosis not present

## 2023-06-03 DIAGNOSIS — K922 Gastrointestinal hemorrhage, unspecified: Secondary | ICD-10-CM | POA: Insufficient documentation

## 2023-06-03 DIAGNOSIS — I10 Essential (primary) hypertension: Secondary | ICD-10-CM | POA: Diagnosis present

## 2023-06-03 DIAGNOSIS — K4090 Unilateral inguinal hernia, without obstruction or gangrene, not specified as recurrent: Secondary | ICD-10-CM | POA: Diagnosis present

## 2023-06-03 DIAGNOSIS — I480 Paroxysmal atrial fibrillation: Secondary | ICD-10-CM | POA: Diagnosis not present

## 2023-06-03 DIAGNOSIS — K219 Gastro-esophageal reflux disease without esophagitis: Secondary | ICD-10-CM | POA: Diagnosis present

## 2023-06-03 DIAGNOSIS — Z841 Family history of disorders of kidney and ureter: Secondary | ICD-10-CM | POA: Diagnosis not present

## 2023-06-03 DIAGNOSIS — R310 Gross hematuria: Secondary | ICD-10-CM | POA: Diagnosis not present

## 2023-06-03 DIAGNOSIS — I2510 Atherosclerotic heart disease of native coronary artery without angina pectoris: Secondary | ICD-10-CM | POA: Diagnosis present

## 2023-06-03 DIAGNOSIS — W19XXXA Unspecified fall, initial encounter: Secondary | ICD-10-CM | POA: Diagnosis not present

## 2023-06-03 DIAGNOSIS — Z6837 Body mass index (BMI) 37.0-37.9, adult: Secondary | ICD-10-CM

## 2023-06-03 DIAGNOSIS — M1990 Unspecified osteoarthritis, unspecified site: Secondary | ICD-10-CM | POA: Diagnosis not present

## 2023-06-03 DIAGNOSIS — Z7901 Long term (current) use of anticoagulants: Secondary | ICD-10-CM

## 2023-06-03 DIAGNOSIS — R1312 Dysphagia, oropharyngeal phase: Secondary | ICD-10-CM | POA: Diagnosis not present

## 2023-06-03 DIAGNOSIS — Z483 Aftercare following surgery for neoplasm: Secondary | ICD-10-CM | POA: Diagnosis not present

## 2023-06-03 DIAGNOSIS — J4550 Severe persistent asthma, uncomplicated: Secondary | ICD-10-CM | POA: Diagnosis not present

## 2023-06-03 DIAGNOSIS — N329 Bladder disorder, unspecified: Secondary | ICD-10-CM | POA: Diagnosis not present

## 2023-06-03 LAB — URINALYSIS, ROUTINE W REFLEX MICROSCOPIC
Bilirubin Urine: NEGATIVE
Glucose, UA: NEGATIVE mg/dL
Ketones, ur: NEGATIVE mg/dL
Leukocytes,Ua: NEGATIVE
Nitrite: NEGATIVE
Protein, ur: 100 mg/dL — AB
RBC / HPF: >50 RBC/hpf (ref 0–5)
Specific Gravity, Urine: 1.015 (ref 1.005–1.030)
WBC, UA: >50 WBC/hpf (ref 0–5)
pH: 5 (ref 5.0–8.0)

## 2023-06-03 LAB — HEMOGLOBIN AND HEMATOCRIT, BLOOD
HCT: 33 % — ABNORMAL LOW (ref 39.0–52.0)
Hemoglobin: 9.9 g/dL — ABNORMAL LOW (ref 13.0–17.0)

## 2023-06-03 LAB — CBC WITH DIFFERENTIAL/PLATELET
Abs Immature Granulocytes: 0.02 10*3/uL (ref 0.00–0.07)
Basophils Absolute: 0 10*3/uL (ref 0.0–0.1)
Basophils Relative: 1 %
Eosinophils Absolute: 0.1 10*3/uL (ref 0.0–0.5)
Eosinophils Relative: 2 %
HCT: 32.5 % — ABNORMAL LOW (ref 39.0–52.0)
Hemoglobin: 9.5 g/dL — ABNORMAL LOW (ref 13.0–17.0)
Immature Granulocytes: 0 %
Lymphocytes Relative: 18 %
Lymphs Abs: 0.9 10*3/uL (ref 0.7–4.0)
MCH: 28.2 pg (ref 26.0–34.0)
MCHC: 29.2 g/dL — ABNORMAL LOW (ref 30.0–36.0)
MCV: 96.4 fL (ref 80.0–100.0)
Monocytes Absolute: 0.5 10*3/uL (ref 0.1–1.0)
Monocytes Relative: 11 %
Neutro Abs: 3.4 10*3/uL (ref 1.7–7.7)
Neutrophils Relative %: 68 %
Platelets: 239 10*3/uL (ref 150–400)
RBC: 3.37 MIL/uL — ABNORMAL LOW (ref 4.22–5.81)
RDW: 17.4 % — ABNORMAL HIGH (ref 11.5–15.5)
WBC: 5 10*3/uL (ref 4.0–10.5)
nRBC: 0 % (ref 0.0–0.2)

## 2023-06-03 LAB — TYPE AND SCREEN
ABO/RH(D): A POS
Antibody Screen: NEGATIVE

## 2023-06-03 LAB — COMPREHENSIVE METABOLIC PANEL
ALT: 16 U/L (ref 0–44)
AST: 18 U/L (ref 15–41)
Albumin: 3.3 g/dL — ABNORMAL LOW (ref 3.5–5.0)
Alkaline Phosphatase: 59 U/L (ref 38–126)
Anion gap: 10 (ref 5–15)
BUN: 30 mg/dL — ABNORMAL HIGH (ref 8–23)
CO2: 22 mmol/L (ref 22–32)
Calcium: 8.8 mg/dL — ABNORMAL LOW (ref 8.9–10.3)
Chloride: 109 mmol/L (ref 98–111)
Creatinine, Ser: 0.81 mg/dL (ref 0.61–1.24)
GFR, Estimated: >60 mL/min (ref >60)
Glucose, Bld: 98 mg/dL (ref 70–99)
Potassium: 3.8 mmol/L (ref 3.5–5.1)
Sodium: 141 mmol/L (ref 135–145)
Total Bilirubin: 0.3 mg/dL (ref 0.3–1.2)
Total Protein: 6.4 g/dL — ABNORMAL LOW (ref 6.5–8.1)

## 2023-06-03 LAB — CBG MONITORING, ED: Glucose-Capillary: 94 mg/dL (ref 70–99)

## 2023-06-03 LAB — PROTIME-INR
INR: 1 (ref 0.8–1.2)
Prothrombin Time: 13.7 seconds (ref 11.4–15.2)

## 2023-06-03 LAB — MAGNESIUM: Magnesium: 2 mg/dL (ref 1.7–2.4)

## 2023-06-03 LAB — ABO/RH: ABO/RH(D): A POS

## 2023-06-03 LAB — APTT: aPTT: 30 seconds (ref 24–36)

## 2023-06-03 MED ORDER — SODIUM CHLORIDE (PF) 0.9 % IJ SOLN
INTRAMUSCULAR | Status: AC
  Filled 2023-06-03: qty 50

## 2023-06-03 MED ORDER — NITROGLYCERIN 0.4 MG SL SUBL: 0.4000 mg | SUBLINGUAL_TABLET | SUBLINGUAL | Status: DC | PRN

## 2023-06-03 MED ORDER — SODIUM CHLORIDE 0.9 % IV SOLN
1.0000 g | Freq: Once | INTRAVENOUS | Status: AC
  Administered 2023-06-03: 1 g via INTRAVENOUS
  Filled 2023-06-03: qty 10

## 2023-06-03 MED ORDER — SULFASALAZINE 500 MG PO TABS
1000.0000 mg | ORAL_TABLET | Freq: Four times a day (QID) | ORAL | Status: DC
  Administered 2023-06-03 – 2023-06-10 (×26): 1000 mg via ORAL
  Filled 2023-06-03 (×29): qty 2

## 2023-06-03 MED ORDER — POLYVINYL ALCOHOL 1.4 % OP SOLN: 1.0000 [drp] | Freq: Three times a day (TID) | OPHTHALMIC | Status: DC | PRN

## 2023-06-03 MED ORDER — METOPROLOL TARTRATE 50 MG PO TABS
50.0000 mg | ORAL_TABLET | Freq: Two times a day (BID) | ORAL | Status: DC
  Administered 2023-06-03 – 2023-06-10 (×14): 50 mg via ORAL
  Filled 2023-06-03 (×14): qty 1

## 2023-06-03 MED ORDER — ACETAMINOPHEN 325 MG PO TABS
650.0000 mg | ORAL_TABLET | Freq: Four times a day (QID) | ORAL | Status: DC | PRN
  Administered 2023-06-04 – 2023-06-06 (×3): 650 mg via ORAL
  Filled 2023-06-03 (×3): qty 2

## 2023-06-03 MED ORDER — CARBOXYMETHYLCELLULOSE SODIUM 0.5 % OP SOLN: 1.0000 [drp] | Freq: Three times a day (TID) | OPHTHALMIC | Status: DC | PRN

## 2023-06-03 MED ORDER — CYCLOSPORINE 0.05 % OP EMUL
1.0000 [drp] | Freq: Two times a day (BID) | OPHTHALMIC | Status: DC
  Administered 2023-06-03 – 2023-06-10 (×14): 1 [drp] via OPHTHALMIC
  Filled 2023-06-03 (×14): qty 30

## 2023-06-03 MED ORDER — ACETAMINOPHEN 650 MG RE SUPP: 650.0000 mg | Freq: Four times a day (QID) | RECTAL | Status: DC | PRN

## 2023-06-03 MED ORDER — LACTATED RINGERS IV BOLUS
1000.0000 mL | Freq: Once | INTRAVENOUS | Status: AC
  Administered 2023-06-03: 1000 mL via INTRAVENOUS

## 2023-06-03 MED ORDER — EZETIMIBE 10 MG PO TABS
10.0000 mg | ORAL_TABLET | Freq: Every day | ORAL | Status: DC
  Administered 2023-06-03 – 2023-06-10 (×8): 10 mg via ORAL
  Filled 2023-06-03 (×8): qty 1

## 2023-06-03 MED ORDER — DILTIAZEM HCL ER COATED BEADS 240 MG PO CP24
240.0000 mg | ORAL_CAPSULE | Freq: Every day | ORAL | Status: DC
  Administered 2023-06-03 – 2023-06-06 (×4): 240 mg via ORAL
  Filled 2023-06-03 (×4): qty 1

## 2023-06-03 MED ORDER — ONDANSETRON HCL 4 MG/2ML IJ SOLN: 4.0000 mg | Freq: Four times a day (QID) | INTRAMUSCULAR | Status: DC | PRN

## 2023-06-03 MED ORDER — ONDANSETRON HCL 4 MG PO TABS: 4.0000 mg | ORAL_TABLET | Freq: Four times a day (QID) | ORAL | Status: DC | PRN

## 2023-06-03 MED ORDER — LOSARTAN POTASSIUM 25 MG PO TABS
25.0000 mg | ORAL_TABLET | Freq: Every day | ORAL | Status: DC
  Administered 2023-06-03 – 2023-06-06 (×4): 25 mg via ORAL
  Filled 2023-06-03 (×4): qty 1

## 2023-06-03 MED ORDER — LATANOPROST 0.005 % OP SOLN
1.0000 [drp] | Freq: Every day | OPHTHALMIC | Status: DC
  Administered 2023-06-03 – 2023-06-09 (×7): 1 [drp] via OPHTHALMIC
  Filled 2023-06-03: qty 2.5

## 2023-06-03 MED ORDER — ESCITALOPRAM OXALATE 20 MG PO TABS
20.0000 mg | ORAL_TABLET | Freq: Every day | ORAL | Status: DC
  Administered 2023-06-03 – 2023-06-10 (×8): 20 mg via ORAL
  Filled 2023-06-03 (×8): qty 1

## 2023-06-03 MED ORDER — IOHEXOL 300 MG/ML  SOLN
100.0000 mL | Freq: Once | INTRAMUSCULAR | Status: AC | PRN
  Administered 2023-06-03: 100 mL via INTRAVENOUS

## 2023-06-03 MED ORDER — ATORVASTATIN CALCIUM 40 MG PO TABS
40.0000 mg | ORAL_TABLET | Freq: Every day | ORAL | Status: DC
  Administered 2023-06-03 – 2023-06-10 (×8): 40 mg via ORAL
  Filled 2023-06-03 (×8): qty 1

## 2023-06-03 MED ORDER — ALBUTEROL SULFATE HFA 108 (90 BASE) MCG/ACT IN AERS: 2.0000 | INHALATION_SPRAY | RESPIRATORY_TRACT | Status: DC | PRN

## 2023-06-03 MED ORDER — MOMETASONE FURO-FORMOTEROL FUM 200-5 MCG/ACT IN AERO
2.0000 | INHALATION_SPRAY | Freq: Two times a day (BID) | RESPIRATORY_TRACT | Status: DC
  Administered 2023-06-03 – 2023-06-10 (×14): 2 via RESPIRATORY_TRACT
  Filled 2023-06-03: qty 8.8

## 2023-06-03 MED ORDER — ALBUTEROL SULFATE (2.5 MG/3ML) 0.083% IN NEBU: 2.5000 mg | INHALATION_SOLUTION | RESPIRATORY_TRACT | Status: DC | PRN

## 2023-06-03 MED ORDER — PANTOPRAZOLE SODIUM 40 MG PO TBEC
40.0000 mg | DELAYED_RELEASE_TABLET | Freq: Every day | ORAL | Status: DC
  Administered 2023-06-03 – 2023-06-10 (×8): 40 mg via ORAL
  Filled 2023-06-03 (×8): qty 1

## 2023-06-03 MED ORDER — SODIUM CHLORIDE 0.9 % IV SOLN
1.0000 g | INTRAVENOUS | Status: AC
  Administered 2023-06-04 – 2023-06-08 (×5): 1 g via INTRAVENOUS
  Filled 2023-06-03 (×5): qty 10

## 2023-06-03 NOTE — Progress Notes (Signed)
I looked back to [facility] and I did not see.  It I'll talk to him explain that days MD's will have to talk to him about it.

## 2023-06-03 NOTE — ED Notes (Signed)
ED TO INPATIENT HANDOFF REPORT  Name/Age/Gender Barry [name] 77 y.o. male  Code Status   Home/SNF/Other Home  Chief Complaint Complicated UTI (urinary tract infection) [N39.0]  Level of Care/Admitting Diagnosis ED Disposition     ED Disposition  Admit   Condition  --   Comment  Hospital Area: [facility] [[id number]]  Level of Care: Telemetry [5]  Admit to tele based on following criteria: Other see comments  Comments: CAD  May admit patient to [facility] or [facility] if equivalent level of care is available:: No  Covid Evaluation: Asymptomatic - no recent exposure (last 10 days) testing not required  Diagnosis: Complicated UTI (urinary tract infection) [161096]  Admitting Physician: Bobette Mo [[id number]]  Attending Physician: Bobette Mo [[id number]]  Certification:: I certify this patient will need inpatient services for at least 2 midnights  Estimated Length of Stay: 2          Medical History Past Medical History:  Diagnosis Date   A-fib (HCC)    Acute sinusitis 03/24/2012   Followed in Pulmonary clinic/ [facility]/ [name] - CT sinus 08/07/2013  Short air-fluid levels in the maxillary sinuses bilaterally suggesting early acute sinusitis. - repeat ct sinus 04/30/2014 > ok     Acute sinusitis, unspecified 08/14/2010   Qualifier: Diagnosis of  By: Clent Ridges NP, [name]     Asthma    PFTs 06/15/05 FEV1 85% predicted ratio 68% and truncation of resp loop in a sawtooth pattern. HFA 25% 08-01-2009 >50% Nov 01, 2009>50% 01-31-2010   Asthma, chronic 11/24/2007   Followed in Pulmonary clinic/ [facility]/ [name]  - PFTs 06/15/2005 FEV1 85% predicted ratio 68% and truncation of respiratory loop in a sawtooth pattern  - HFA 25% August 01, 2009 > 50% November 01, 2009 > 50% January 31, 2010 therefore changed to neb bud/brovana - HFA 75% p extensive coaching 08/07/2013  > insurance issues so try symbicort 160 2 bid instead of neb - 11/16/2015   p  extensive   BRONCHITIS, ACUTE 01/22/2011   Qualifier: Diagnosis of  By: Clent Ridges NP, [name]     CAD (coronary artery disease), native coronary artery 02/06/2019   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   Cataract    Chronic cough    Chronic rhinitis 05/02/2009   Followed in Pulmonary clinic/ [facility]/ [name]    - 03/24/12 CT Sinus > Negative paranasal sinuses - Sinus CT 08/07/2013 >>Short air-fluid levels in the maxillary sinuses bilaterally suggesting early acute sinusitis - Repeat augmentin x 21 days 03/27/2014 then sinus ct if not better  - flonase/afrin regimen 02/11/15 >>>      Coronary heart disease    COUGH, CHRONIC 11/24/2007   Followed in Pulmonary clinic/ [facility]/ [name]      DDD (degenerative disc disease), lumbar    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   [name]  -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     Excessive ear wax, bilateral 09/15/2018   GASTROESOPHAGEAL REFLUX DISEASE 08/27/2008   Followed as Primary Care Patient/ GI/ Dr  Danise Edge     Glaucoma    Hyperlipidemia LDL goal <70 02/06/2019   Hypertension    Obesity    Osteoarthritis    Paralyzed hemidiaphragm    left, 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   Retina disorder 2014   Severe obesity (BMI >= 40) (HCC) 11/24/2007       - Target wt < 191 to get under BMI 30  Ulcerative colitis    ULCERATIVE COLITIS 11/24/2007   Followed as Primary Care Patient/ GI/ Dr  Danise Edge     URI 04/09/2008   Qualifier: Diagnosis of  By: Clent Ridges NP, [name]      Allergies Allergies  Allergen Reactions   Piroxicam Itching and Rash    IV Location/Drains/Wounds Patient Lines/Drains/Airways Status     Active Line/Drains/Airways     Name Placement date Placement time Site Days   Peripheral IV 06/03/23 Anterior;Distal;Left;Upper Arm 06/03/23  0915  Arm  less than 1            Labs/Imaging Results for orders placed or performed during the hospital encounter of  06/03/23 (from the past 48 hour(s))  Urinalysis, Routine w reflex microscopic -Urine, Clean Catch     Status: Abnormal   Collection Time: 06/03/23  9:12 AM  Result Value Ref Range   Color, Urine AMBER (A) YELLOW    Comment: BIOCHEMICALS MAY BE AFFECTED BY COLOR   APPearance CLOUDY (A) CLEAR   Specific Gravity, Urine 1.015 1.005 - 1.030   pH 5.0 5.0 - 8.0   Glucose, UA NEGATIVE NEGATIVE mg/dL   Hgb urine dipstick LARGE (A) NEGATIVE   Bilirubin Urine NEGATIVE NEGATIVE   Ketones, ur NEGATIVE NEGATIVE mg/dL   Protein, ur 161 (A) NEGATIVE mg/dL   Nitrite NEGATIVE NEGATIVE   Leukocytes,Ua NEGATIVE NEGATIVE   RBC / HPF >50 0 - 5 RBC/hpf   WBC, UA >50 0 - 5 WBC/hpf   Bacteria, UA RARE (A) NONE SEEN   Squamous Epithelial / HPF 0-5 0 - 5 /HPF   WBC Clumps PRESENT    Mucus PRESENT    Budding Yeast PRESENT     Comment: Performed at [facility], [address], [place], [place] [address]  POC CBG, ED     Status: None   Collection Time: 06/03/23  9:18 AM  Result Value Ref Range   Glucose-Capillary 94 70 - 99 mg/dL    Comment: Glucose reference range applies only to samples taken after fasting for at least 8 hours.  CBC with Differential     Status: Abnormal   Collection Time: 06/03/23  9:20 AM  Result Value Ref Range   WBC 5.0 4.0 - 10.5 K/uL   RBC 3.37 (L) 4.22 - 5.81 MIL/uL   Hemoglobin 9.5 (L) 13.0 - 17.0 g/dL   HCT 54.0 (L) 98.1 - 19.1 %   MCV 96.4 80.0 - 100.0 fL   MCH 28.2 26.0 - 34.0 pg   MCHC 29.2 (L) 30.0 - 36.0 g/dL   RDW 47.8 (H) 29.5 - 62.1 %   Platelets 239 150 - 400 K/uL   nRBC 0.0 0.0 - 0.2 %   Neutrophils Relative % 68 %   Neutro Abs 3.4 1.7 - 7.7 K/uL   Lymphocytes Relative 18 %   Lymphs Abs 0.9 0.7 - 4.0 K/uL   Monocytes Relative 11 %   Monocytes Absolute 0.5 0.1 - 1.0 K/uL   Eosinophils Relative 2 %   Eosinophils Absolute 0.1 0.0 - 0.5 K/uL   Basophils Relative 1 %   Basophils Absolute 0.0 0.0 - 0.1 K/uL   Immature Granulocytes 0 %   Abs  Immature Granulocytes 0.02 0.00 - 0.07 K/uL    Comment: Performed at [facility], [address] [place], [place] [address]  Comprehensive metabolic panel     Status: Abnormal   Collection Time: 06/03/23  9:20 AM  Result Value Ref Range  Sodium 141 135 - 145 mmol/L   Potassium 3.8 3.5 - 5.1 mmol/L   Chloride 109 98 - 111 mmol/L   CO2 22 22 - 32 mmol/L   Glucose, Bld 98 70 - 99 mg/dL    Comment: Glucose reference range applies only to samples taken after fasting for at least 8 hours.   BUN 30 (H) 8 - 23 mg/dL   Creatinine, Ser 8.46 0.61 - 1.24 mg/dL   Calcium 8.8 (L) 8.9 - 10.3 mg/dL   Total Protein 6.4 (L) 6.5 - 8.1 g/dL   Albumin 3.3 (L) 3.5 - 5.0 g/dL   AST 18 15 - 41 U/L   ALT 16 0 - 44 U/L   Alkaline Phosphatase 59 38 - 126 U/L   Total Bilirubin 0.3 0.3 - 1.2 mg/dL   GFR, Estimated >96 >29 mL/min    Comment: (NOTE) Calculated using the CKD-EPI Creatinine Equation (2021)    Anion gap 10 5 - 15    Comment: Performed at [facility], [address] [place], [place] [address]  Magnesium     Status: None   Collection Time: 06/03/23  9:20 AM  Result Value Ref Range   Magnesium 2.0 1.7 - 2.4 mg/dL    Comment: Performed at [facility], [address], [place], [place] [address]  Type and screen [facility]     Status: None   Collection Time: 06/03/23  9:35 AM  Result Value Ref Range   ABO/RH(D) A POS    Antibody Screen NEG    Sample Expiration      06/06/2023,2359 Performed at [facility], [address], [place], [place] [address]   Protime-INR     Status: None   Collection Time: 06/03/23  9:35 AM  Result Value Ref Range   Prothrombin Time 13.7 11.4 - 15.2 seconds   INR 1.0 0.8 - 1.2    Comment: (NOTE) INR goal varies based on device and disease states. Performed at [facility], [address], [place], [place] [address]   APTT     Status: None    Collection Time: 06/03/23  9:35 AM  Result Value Ref Range   aPTT 30 24 - 36 seconds    Comment: Performed at [facility], [address], [place], [place] [address]   CT ABDOMEN PELVIS W CONTRAST  Result Date: 06/03/2023 CLINICAL DATA:  Pain history of ulcerative colitis. EXAM: CT ABDOMEN AND PELVIS WITH CONTRAST TECHNIQUE: Multidetector CT imaging of the abdomen and pelvis was performed using the standard protocol following bolus administration of intravenous contrast. RADIATION DOSE REDUCTION: This exam was performed according to the departmental dose-optimization program which includes automated exposure control, adjustment of the mA and/or kV according to patient size and/or use of iterative reconstruction technique. CONTRAST:  OMNIPAQUE IOHEXOL 300 MG/ML  SOLN COMPARISON:  None Available. FINDINGS: Lower chest: Breathing motion at the lung bases. Patient is status post median sternotomy. Coronary artery calcifications are seen. Elevated left hemidiaphragm. Calcified nodule left lower lobe series 5, image 28 consistent with old granulomatous disease. Hepatobiliary: No space-occupying liver lesion. Patent portal vein. Gallbladder is nondilated. Pancreas: Unremarkable. No pancreatic ductal dilatation or surrounding inflammatory changes. Spleen: Normal in size without focal abnormality. Adrenals/Urinary Tract: Slight nonspecific nodularity of the adrenal glands. The left kidney is absent. The remnant distal left ureter is mildly dilated. Nonobstructing right-sided renal stone along the renal pelvis measuring 8 mm on series 3, image  40. No separate enhancing right-sided renal mass or right-sided renal collecting system dilatation. The bladder however has multiple mass lesions along the lumen diffusely distributed. Please correlate for any known history including for potential bladder cancer. Area of dystrophic calcification as well along the posterior aspect of the the bladder on  the right side near the margin of the UVJ. Stomach/Bowel: No oral contrast. Stomach is nondilated. Small bowel is nondilated. There is a large left inguinal hernia involving a significant portion of the sigmoid colon. This a wide mouth hernia. No signs of obstruction. Few areas of colonic diverticula. No specific colonic wall thickening, inflammatory stranding. The appendix is poorly seen in the right lower quadrant but no pericecal stranding or fluid. Vascular/Lymphatic: Aortic atherosclerosis. No enlarged abdominal or pelvic lymph nodes. Reproductive: Prostate is unremarkable. Other: Small fat containing umbilical hernia Musculoskeletal: Curvature of the spine. Scattered degenerative changes of the spine and pelvis. IMPRESSION: Multiple mass lesions along the bladder lumen with some areas of dystrophic calcification near the margin of the right UVJ. Please correlate for any known history. Neoplasm is possible recommend further workup when appropriate otherwise. Absence of the left kidney. Nonobstructing right-sided renal stone. No bowel obstruction, free air or free fluid. Large left inguinal hernia involving sigmoid colon. Electronically Signed   By: Karen Kays M.D.   On: 06/03/2023 11:13    Pending Labs Unresulted Labs (From admission, onward)     Start     Ordered   06/03/23 1457  Urine Culture  Add-on,   AD       Question:  Indication  Answer:  Acute gross hematuria   06/03/23 1456   06/03/23 1100  ABO/Rh  Once,   R        06/03/23 1100            Vitals/Pain Today's Vitals   06/03/23 1500 06/03/23 1515 06/03/23 1545 06/03/23 1615  BP: 132/80 138/76 (!) 127/92 107/74  Pulse: 92 (!) 103 (!) 110 (!) 110  Resp: 17 (!) 21 (!) 24 17  Temp:      TempSrc:      SpO2: 96% 95% 95% 94%  PainSc:        Isolation Precautions No active isolations  Medications Medications  iohexol (OMNIPAQUE) 300 MG/ML solution 100 mL (100 mLs Intravenous Contrast Given 06/03/23 1027)  lactated ringers  bolus 1,000 mL (0 mLs Intravenous Stopped 06/03/23 1434)  cefTRIAXone (ROCEPHIN) 1 g in sodium chloride 0.9 % 100 mL IVPB (0 g Intravenous Stopped 06/03/23 1434)    Mobility walks with person assist

## 2023-06-03 NOTE — Consult Note (Signed)
Subjective:    Consult requested by Dr. Sanda Klein.  [name] is a 77yo male who was admitted today with dizziness, recurrent diarrhea and intermittently bloody stools.  He has been having some hematuria recently and has had progressive LUTS with frequency, urgency and some difficulty voiding.  He had a CT abdomen/pelvis that showed multiple bladder lesions consistent urothelial carcinoma with the largest close to 5cm in diameter.  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  There is an 8mm non-obstructing stone in the renal pelvis.   His UA has >50 WBC, >50 RBC's and budding yeast but only a few bacteria.   Apart from the nephrectomy he has no other GU history.  He has been on Eliquis for afib but that has been stopped.   His comorbidities include a medical history significant for paroxysmal atrial fibrillation, history of acute sinusitis, asthma, history of acute bronchitis, CAD, CABG, cataracts, chronic cough, chronic rhinitis, GERD, glaucoma, hyperlipidemia, hypertension, class III obesity, osteoarthritis, left side paralyzed hemidiaphragm and a retinal disorder,  ROS:  Review of Systems  Constitutional:  Positive for chills, fever and malaise/fatigue.  Respiratory:  Positive for stridor.   Gastrointestinal:        Loss of appetite.   Genitourinary:  Positive for frequency, hematuria and urgency. Negative for flank pain.  Musculoskeletal:  Positive for joint pain (both knees.).  Neurological:  Positive for focal weakness.  All other systems reviewed and are negative.   Allergies  Allergen Reactions   Piroxicam Itching and Rash    Past Medical History:  Diagnosis Date   A-fib (HCC)    Acute sinusitis 03/24/2012   Followed in Pulmonary clinic/ [facility]/ [name] - CT sinus 08/07/2013  Short air-fluid levels in the maxillary sinuses bilaterally suggesting early acute sinusitis. - repeat ct sinus 04/30/2014 > ok     Acute sinusitis,  unspecified 08/14/2010   Qualifier: Diagnosis of  By: Clent Ridges NP, [name]     Asthma    PFTs 06/15/05 FEV1 85% predicted ratio 68% and truncation of resp loop in a sawtooth pattern. HFA 25% 08-01-2009 >50% Nov 01, 2009>50% 01-31-2010   Asthma, chronic 11/24/2007   Followed in Pulmonary clinic/ [facility]/ [name]  - PFTs 06/15/2005 FEV1 85% predicted ratio 68% and truncation of respiratory loop in a sawtooth pattern  - HFA 25% August 01, 2009 > 50% November 01, 2009 > 50% January 31, 2010 therefore changed to neb bud/brovana - HFA 75% p extensive coaching 08/07/2013  > insurance issues so try symbicort 160 2 bid instead of neb - 11/16/2015   p extensive   BRONCHITIS, ACUTE 01/22/2011   Qualifier: Diagnosis of  By: Clent Ridges NP, [name]     CAD (coronary artery disease), native coronary artery 02/06/2019   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   Cataract    Chronic cough    Chronic rhinitis 05/02/2009   Followed in Pulmonary clinic/ [facility]/ [name]    - 03/24/12 CT Sinus > Negative paranasal sinuses - Sinus CT 08/07/2013 >>Short air-fluid levels in the maxillary sinuses bilaterally suggesting early acute sinusitis - Repeat augmentin x 21 days 03/27/2014 then sinus ct if not better  - flonase/afrin regimen 02/11/15 >>>      Coronary heart disease    COUGH, CHRONIC 11/24/2007   [name]      DDD (degenerative disc disease), lumbar    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  Followed in Pulmonary clinic/ [facility]/ [name]  -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     Excessive ear wax, bilateral 09/15/2018   GASTROESOPHAGEAL REFLUX DISEASE 08/27/2008   Followed as Primary Care Patient/ GI/ Dr  Danise Edge     Glaucoma    Hyperlipidemia LDL goal <70 02/06/2019   Hypertension    Obesity    Osteoarthritis    Paralyzed hemidiaphragm    left, 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   Retina disorder 2014   Severe obesity (BMI >= 40) (HCC) 11/24/2007        - Target wt < 191 to get under BMI 30    Ulcerative colitis    ULCERATIVE COLITIS 11/24/2007   Followed as Primary Care Patient/ GI/ Dr  Danise Edge     URI 04/09/2008   Qualifier: Diagnosis of  By: Clent Ridges NP, [name]      Past Surgical History:  Procedure Laterality Date   CARDIAC CATHETERIZATION Left    COLONOSCOPY WITH PROPOFOL N/A 11/29/2015   Procedure: COLONOSCOPY WITH PROPOFOL;  Surgeon: Charolett Bumpers, MD;  Location: [facility];  Service: Endoscopy;  Laterality: N/A;   CORONARY ARTERY BYPASS GRAFT     x6    Social History   Socioeconomic History   Marital status: Single    Spouse name: Not on file   Number of children: 0   Years of education: Not on file   Highest education level: Not on file  Occupational History   Occupation: disabled, prev worked in Designer, fashion/clothing  Tobacco Use   Smoking status: Never   Smokeless tobacco: Never  Substance and Sexual Activity   Alcohol use: No   Drug use: No   Sexual activity: Not on file  Other Topics Concern   Not on file  Social History Narrative   ** Merged History Encounter **       Social Determinants of Health   Financial Resource Strain: Not on file  Food Insecurity: No Food Insecurity (06/03/2023)   Hunger Vital Sign    Worried About Running Out of Food in the Last Year: Never true    Ran Out of Food in the Last Year: Never true  Transportation Needs: No Transportation Needs (06/03/2023)   PRAPARE - Administrator, Civil Service (Medical): No    Lack of Transportation (Non-Medical): No  Physical Activity: Not on file  Stress: Not on file  Social Connections: Not on file  Intimate Partner Violence: Not At Risk (06/03/2023)   Humiliation, Afraid, Rape, and Kick questionnaire    Fear of Current or Ex-Partner: No    Emotionally Abused: No    Physically Abused: No    Sexually Abused: No    Family History  Problem Relation Age of Onset   Heart disease Father    Hypertension Mother    Renal Disease  Mother    Pulmonary Hypertension Mother    Other Sister        UNKNOWN HISTORY    Anti-infectives: Anti-infectives (From admission, onward)    Start     Dose/Rate Route Frequency Ordered Stop   06/04/23 1000  cefTRIAXone (ROCEPHIN) 1 g in sodium chloride 0.9 % 100 mL IVPB        1 g 200 mL/hr over 30 Minutes Intravenous Every 24 hours 06/03/23 1830     06/03/23 1115  cefTRIAXone (ROCEPHIN) 1 g in sodium chloride 0.9 % 100 mL IVPB        1 g 200 mL/hr over 30 Minutes  Intravenous  Once 06/03/23 1102 06/03/23 1434       Current Facility-Administered Medications  Medication Dose Route Frequency Provider Last Rate Last Admin   acetaminophen (TYLENOL) tablet 650 mg  650 mg Oral Q6H PRN Bobette Mo, MD       Or   acetaminophen (TYLENOL) suppository 650 mg  650 mg Rectal Q6H PRN Bobette Mo, MD       albuterol (PROVENTIL) (2.5 MG/3ML) 0.083% nebulizer solution 2.5 mg  2.5 mg Nebulization Q4H PRN Lynden Ang, RPH       atorvastatin (LIPITOR) tablet 40 mg  40 mg Oral Daily Bobette Mo, MD       [START ON 06/04/2023] cefTRIAXone (ROCEPHIN) 1 g in sodium chloride 0.9 % 100 mL IVPB  1 g Intravenous Q24H Bobette Mo, MD       cycloSPORINE (RESTASIS) 0.05 % ophthalmic emulsion 1 drop  1 drop Both Eyes BID Bobette Mo, MD       diltiazem (CARDIZEM CD) 24 hr capsule 240 mg  240 mg Oral Daily Bobette Mo, MD       escitalopram Judye Bos) tablet 20 mg  20 mg Oral Daily Bobette Mo, MD       ezetimibe (ZETIA) tablet 10 mg  10 mg Oral Daily Bobette Mo, MD       latanoprost (XALATAN) 0.005 % ophthalmic solution 1 drop  1 drop Both Eyes QHS Bobette Mo, MD       losartan (COZAAR) tablet 25 mg  25 mg Oral Daily Bobette Mo, MD       metoprolol tartrate (LOPRESSOR) tablet 50 mg  50 mg Oral BID Bobette Mo, MD       mometasone-formoterol [facility]) 200-5 MCG/ACT inhaler 2 puff  2 puff Inhalation BID Bobette Mo,  MD   2 puff at 06/03/23 2033   nitroGLYCERIN (NITROSTAT) SL tablet 0.4 mg  0.4 mg Sublingual Q5 min PRN Bobette Mo, MD       ondansetron [facility]) tablet 4 mg  4 mg Oral Q6H PRN Bobette Mo, MD       Or   ondansetron [facility]) injection 4 mg  4 mg Intravenous Q6H PRN Bobette Mo, MD       pantoprazole (PROTONIX) EC tablet 40 mg  40 mg Oral Daily Bobette Mo, MD       polyvinyl alcohol (LIQUIFILM TEARS) 1.4 % ophthalmic solution 1 drop  1 drop Both Eyes TID PRN Lynden Ang, RPH       sulfaSALAzine (AZULFIDINE) tablet 1,000 mg  1,000 mg Oral QID Bobette Mo, MD         Objective: Vital signs in last 24 hours: BP (!) 143/84 (BP Location: Right Arm)   Pulse (!) 106   Temp 98.6 F (37 C) (Oral)   Resp 17   Ht 5\' 3"  (1.6 m)   Wt 95.5 kg   SpO2 95%   BMI 37.30 kg/m   Intake/Output from previous day: No intake/output data recorded. Intake/Output this shift: No intake/output data recorded.   Physical Exam Vitals reviewed.  Constitutional:      Appearance: Normal appearance.  Cardiovascular:     Rate and Rhythm: Regular rhythm. Tachycardia present.     Heart sounds: Normal heart sounds.  Pulmonary:     Effort: Pulmonary effort is normal. No respiratory distress.  Abdominal:     General: Abdomen is flat.     Palpations: Abdomen is  soft.     Tenderness: There is abdominal tenderness (suprapubic).     Hernia: No hernia is present.  Genitourinary:    Comments: Purewick apparatus in place.  Urine is amber.  Musculoskeletal:        General: No swelling. Normal range of motion.  Skin:    General: Skin is warm and dry.  Neurological:     Mental Status: He is alert and oriented to person, place, and time.     Motor: Weakness (left side) present.  Psychiatric:        Mood and Affect: Mood normal.        Behavior: Behavior normal.     Lab Results:  Results for orders placed or performed during the hospital encounter of 06/03/23 (from  the past 24 hour(s))  Urinalysis, Routine w reflex microscopic -Urine, Clean Catch     Status: Abnormal   Collection Time: 06/03/23  9:12 AM  Result Value Ref Range   Color, Urine AMBER (A) YELLOW   APPearance CLOUDY (A) CLEAR   Specific Gravity, Urine 1.015 1.005 - 1.030   pH 5.0 5.0 - 8.0   Glucose, UA NEGATIVE NEGATIVE mg/dL   Hgb urine dipstick LARGE (A) NEGATIVE   Bilirubin Urine NEGATIVE NEGATIVE   Ketones, ur NEGATIVE NEGATIVE mg/dL   Protein, ur 161 (A) NEGATIVE mg/dL   Nitrite NEGATIVE NEGATIVE   Leukocytes,Ua NEGATIVE NEGATIVE   RBC / HPF >50 0 - 5 RBC/hpf   WBC, UA >50 0 - 5 WBC/hpf   Bacteria, UA RARE (A) NONE SEEN   Squamous Epithelial / HPF 0-5 0 - 5 /HPF   WBC Clumps PRESENT    Mucus PRESENT    Budding Yeast PRESENT   POC CBG, ED     Status: None   Collection Time: 06/03/23  9:18 AM  Result Value Ref Range   Glucose-Capillary 94 70 - 99 mg/dL  CBC with Differential     Status: Abnormal   Collection Time: 06/03/23  9:20 AM  Result Value Ref Range   WBC 5.0 4.0 - 10.5 K/uL   RBC 3.37 (L) 4.22 - 5.81 MIL/uL   Hemoglobin 9.5 (L) 13.0 - 17.0 g/dL   HCT 09.6 (L) 04.5 - 40.9 %   MCV 96.4 80.0 - 100.0 fL   MCH 28.2 26.0 - 34.0 pg   MCHC 29.2 (L) 30.0 - 36.0 g/dL   RDW 81.1 (H) 91.4 - 78.2 %   Platelets 239 150 - 400 K/uL   nRBC 0.0 0.0 - 0.2 %   Neutrophils Relative % 68 %   Neutro Abs 3.4 1.7 - 7.7 K/uL   Lymphocytes Relative 18 %   Lymphs Abs 0.9 0.7 - 4.0 K/uL   Monocytes Relative 11 %   Monocytes Absolute 0.5 0.1 - 1.0 K/uL   Eosinophils Relative 2 %   Eosinophils Absolute 0.1 0.0 - 0.5 K/uL   Basophils Relative 1 %   Basophils Absolute 0.0 0.0 - 0.1 K/uL   Immature Granulocytes 0 %   Abs Immature Granulocytes 0.02 0.00 - 0.07 K/uL  Comprehensive metabolic panel     Status: Abnormal   Collection Time: 06/03/23  9:20 AM  Result Value Ref Range   Sodium 141 135 - 145 mmol/L   Potassium 3.8 3.5 - 5.1 mmol/L   Chloride 109 98 - 111 mmol/L   CO2 22 22 -  32 mmol/L   Glucose, Bld 98 70 - 99 mg/dL   BUN 30 (H) 8 - 23 mg/dL  Creatinine, Ser 0.81 0.61 - 1.24 mg/dL   Calcium 8.8 (L) 8.9 - 10.3 mg/dL   Total Protein 6.4 (L) 6.5 - 8.1 g/dL   Albumin 3.3 (L) 3.5 - 5.0 g/dL   AST 18 15 - 41 U/L   ALT 16 0 - 44 U/L   Alkaline Phosphatase 59 38 - 126 U/L   Total Bilirubin 0.3 0.3 - 1.2 mg/dL   GFR, Estimated >24 >40 mL/min   Anion gap 10 5 - 15  Magnesium     Status: None   Collection Time: 06/03/23  9:20 AM  Result Value Ref Range   Magnesium 2.0 1.7 - 2.4 mg/dL  Type and screen [facility]     Status: None   Collection Time: 06/03/23  9:35 AM  Result Value Ref Range   ABO/RH(D) A POS    Antibody Screen NEG    Sample Expiration      06/06/2023,2359 Performed at [facility], [address], [place], [place] [address]   Protime-INR     Status: None   Collection Time: 06/03/23  9:35 AM  Result Value Ref Range   Prothrombin Time 13.7 11.4 - 15.2 seconds   INR 1.0 0.8 - 1.2  APTT     Status: None   Collection Time: 06/03/23  9:35 AM  Result Value Ref Range   aPTT 30 24 - 36 seconds  ABO/Rh     Status: None   Collection Time: 06/03/23  5:45 PM  Result Value Ref Range   ABO/RH(D)      A POS Performed at [facility], [address], [place], [place] [address]   Hemoglobin and hematocrit, blood     Status: Abnormal   Collection Time: 06/03/23  5:45 PM  Result Value Ref Range   Hemoglobin 9.9 (L) 13.0 - 17.0 g/dL   HCT 40.3 (L) 47.4 - 25.9 %    BMET Recent Labs    06/03/23 0920  NA 141  K 3.8  CL 109  CO2 22  GLUCOSE 98  BUN 30*  CREATININE 0.81  CALCIUM 8.8*   PT/INR Recent Labs    06/03/23 0935  LABPROT 13.7  INR 1.0   ABG No results for input(s): "PHART", "HCO3" in the last 72 hours.  Invalid input(s): "PCO2", "PO2"  Studies/Results: CT ABDOMEN PELVIS W CONTRAST  Result Date: 06/03/2023 CLINICAL DATA:  Pain history of ulcerative colitis. EXAM: CT  ABDOMEN AND PELVIS WITH CONTRAST TECHNIQUE: Multidetector CT imaging of the abdomen and pelvis was performed using the standard protocol following bolus administration of intravenous contrast. RADIATION DOSE REDUCTION: This exam was performed according to the departmental dose-optimization program which includes automated exposure control, adjustment of the mA and/or kV according to patient size and/or use of iterative reconstruction technique. CONTRAST:  OMNIPAQUE IOHEXOL 300 MG/ML  SOLN COMPARISON:  None Available. FINDINGS: Lower chest: Breathing motion at the lung bases. Patient is status post median sternotomy. Coronary artery calcifications are seen. Elevated left hemidiaphragm. Calcified nodule left lower lobe series 5, image 28 consistent with old granulomatous disease. Hepatobiliary: No space-occupying liver lesion. Patent portal vein. Gallbladder is nondilated. Pancreas: Unremarkable. No pancreatic ductal dilatation or surrounding inflammatory changes. Spleen: Normal in size without focal abnormality. Adrenals/Urinary Tract: Slight nonspecific nodularity of the adrenal glands. The left kidney is absent. The remnant distal left ureter is mildly dilated. Nonobstructing right-sided renal stone along the renal pelvis measuring 8 mm on series 3, image 40. No separate enhancing  right-sided renal mass or right-sided renal collecting system dilatation. The bladder however has multiple mass lesions along the lumen diffusely distributed. Please correlate for any known history including for potential bladder cancer. Area of dystrophic calcification as well along the posterior aspect of the the bladder on the right side near the margin of the UVJ. Stomach/Bowel: No oral contrast. Stomach is nondilated. Small bowel is nondilated. There is a large left inguinal hernia involving a significant portion of the sigmoid colon. This a wide mouth hernia. No signs of obstruction. Few areas of colonic diverticula. No  specific colonic wall thickening, inflammatory stranding. The appendix is poorly seen in the right lower quadrant but no pericecal stranding or fluid. Vascular/Lymphatic: Aortic atherosclerosis. No enlarged abdominal or pelvic lymph nodes. Reproductive: Prostate is unremarkable. Other: Small fat containing umbilical hernia Musculoskeletal: Curvature of the spine. Scattered degenerative changes of the spine and pelvis. IMPRESSION: Multiple mass lesions along the bladder lumen with some areas of dystrophic calcification near the margin of the right UVJ. Please correlate for any known history. Neoplasm is possible recommend further workup when appropriate otherwise. Absence of the left kidney. Nonobstructing right-sided renal stone. No bowel obstruction, free air or free fluid. Large left inguinal hernia involving sigmoid colon. Electronically Signed   By: Karen Kays M.D.   On: 06/03/2023 11:13     Assessment/Plan: Multifocal bladder lesions consistent with urothelial carcinoma.  I have ordered a cytology, but he will probably need a TURBT when more recovered from his acute illness.    Solitary right kidney with 8mm renal pelvic stone.  This will need treatment at some point as well.   With the bladder lesions, he might need a percutaneous approach if the ureteral orifice is involved with tumor.    He will need cardiac and pulmonary clearance prior to any general anesthetic.  I have messaged Dr. Eden Emms and Dr. Sherene Sires.   He has minimal support at home and will need to be assessed for placement.         No follow-ups on file.    CC: Dr. Sanda Klein.      Barry [name] 06/03/2023

## 2023-06-03 NOTE — ED Provider Notes (Signed)
[place] EMERGENCY DEPARTMENT AT [facility] Provider Note   CSN: [id number] Arrival date & time: 06/03/23  4098     History  Chief Complaint  Patient presents with   Dizziness    Barry [name] is a 77 y.o. male with obesity, asthma, CAD, HTN, HLD, paroxysmal afib, ulcerative colitis, GERD who presents with dizziness.  Patient states he has been having an ulcerative colitis flare for the last week.  He states he went to his gastroenterologist on Thursday who reportedly stated that it was the worst UC flare he has had and prescribed him prednisone but he has not taken it yet.  He has been having diarrhea that is intermittently bloody with BRBPR, had 4 episodes of diarrhea yesterday.  Associated with mild L lower abdominal pain that is intermittent.  Possibly some fevers and chills at home. This morning patient went to go to the restroom, felt lightheaded.  As he stood up from the commode he fell onto his bottom.  He did not lose consciousness but he felt like he was going to.  Did not injure himself, hit his head, and does not have any pain as result of the fall.  He does take Eliquis for A-fib which was started in February 2024.  He denies any abdominal surgical history.  Patient takes sulfasalazine and Imodium when necessary for diarrhea related to his UC.  Reported worsening of diarrhea with [facility] on 05/30/2023, and ordered stool studies and considered adding cholestyramine.  Labs on 05/31/2023 were WBC 6.3, hemoglobin 9.8, platelet 253.  HPI     Home Medications Prior to Admission medications   Medication Sig Start Date End Date Taking? Authorizing Provider  acetaminophen (TYLENOL) 500 MG tablet Take per bottle as needed for arthritis    [provider]  apixaban (ELIQUIS) 5 MG TABS tablet Take 1 tablet (5 mg total) by mouth 2 (two) times daily. 12/07/22   Wendall Stade, MD  atorvastatin (LIPITOR) 40 MG tablet TAKE 1 TABLET BY MOUTH EVERY DAY 03/04/23    Wendall Stade, MD  betamethasone dipropionate 0.05 % lotion Apply topically as needed. As directed 01/30/21   [provider]  Bevacizumab (AVASTIN) 100 MG/4ML SOLN as directed Intravenous    [provider]  budesonide-formoterol (SYMBICORT) 160-4.5 MCG/ACT inhaler TAKE 2 PUFFS BY MOUTH TWICE A DAY 09/18/21   Nyoka Cowden, MD  carboxymethylcellulose (REFRESH PLUS) 0.5 % SOLN Use as needed for dry eye    [provider]  Cholecalciferol (VITAMIN D) 50 MCG (2000 UT) tablet Take 2,000 Units by mouth daily.     [provider]  cycloSPORINE (RESTASIS) 0.05 % ophthalmic emulsion Place 1 drop into both eyes 2 (two) times daily.    [provider]  diltiazem (CARDIZEM CD) 240 MG 24 hr capsule TAKE 1 CAPSULE BY MOUTH EVERY DAY 05/14/23   Wendall Stade, MD  docusate sodium (COLACE) 100 MG capsule Take 100 mg by mouth daily as needed for mild constipation.    [provider]  escitalopram (LEXAPRO) 10 MG tablet Take by mouth daily at 6 (six) AM. 07/01/22   [provider]  Eyelid Cleansers (OCUSOFT EYELID CLEANSING) PADS Apply topically as needed. Use daily    [provider]  ezetimibe (ZETIA) 10 MG tablet TAKE 1 TABLET BY MOUTH EVERY DAY 03/04/23   Wendall Stade, MD  Ferrous Sulfate (SLOW FE) 142 (45 Fe) MG TBCR 1 tablet Orally Once a day  [provider]  fexofenadine (ALLEGRA) 180 MG tablet Take 180 mg by mouth as needed for allergies.    [provider]  fluticasone (FLONASE) 50 MCG/ACT nasal spray Place 2 sprays into both nostrils 2 (two) times daily.     [provider]  latanoprost (XALATAN) 0.005 % ophthalmic solution Place 1 drop into both eyes at bedtime.    [provider]  losartan (COZAAR) 25 MG tablet TAKE 1 TABLET BY MOUTH EVERY DAY    [provider]  meloxicam (MOBIC) 15 MG tablet Take 15 mg by mouth daily. 07/25/22   [provider]  metoprolol tartrate  (LOPRESSOR) 50 MG tablet TAKE 1 TABLET BY MOUTH TWICE A DAY 01/09/23   Wendall Stade, MD  montelukast (SINGULAIR) 10 MG tablet TAKE ONE TABLET BY MOUTH AT BEDTIME EVERY NIGHT 02/18/23   Nyoka Cowden, MD  Multiple Vitamins-Minerals (CENTRUM SILVER PO) Take 1 tablet by mouth daily.    [provider]  nitroGLYCERIN (NITROSTAT) 0.4 MG SL tablet Place 1 tablet (0.4 mg total) under the tongue every 5 (five) minutes as needed for chest pain. 01/12/21   Wendall Stade, MD  omeprazole (PRILOSEC) 20 MG capsule Take 20 mg by mouth 2 (two) times daily before a meal.    [provider]  Polyethyl Glyc-Propyl Glyc PF 0.4-0.3 % SOLN Apply 1 drop to eye as needed.    [provider]  Pseudoephedrine-Ibuprofen 30-200 MG TABS Per bottle as needed for sinus pressure/pain    [provider]  Spacer/Aero-Holding Chambers (AEROCHAMBER MV) inhaler Use as instructed 09/18/13   Parrett, Virgel Bouquet, NP  sulfaSALAzine (AZULFIDINE) 500 MG tablet Take 1,000 mg by mouth 4 (four) times daily.    [provider]  VENTOLIN HFA 108 (90 Base) MCG/ACT inhaler INHALE 2 PUFFS INTO THE LUNGS EVERY 4 HRS AS NEEDED FOR WHEEZING OR SHORTNESS OF BREATH 03/19/19   Parrett, Virgel Bouquet, NP      Allergies    Piroxicam    Review of Systems   Review of Systems A 10 point review of systems was performed and is negative unless otherwise reported in HPI.  Physical Exam Updated Vital Signs BP (!) 127/92   Pulse (!) 110   Temp 97.9 F (36.6 C) (Oral)   Resp (!) 24   SpO2 95%  Physical Exam General: Elderly appearing male, lying in bed.  HEENT: Sclera anicteric, dry mucous membranes, trachea midline.  Cardiology: RRR, no murmurs/rubs/gallops. BL radial and DP pulses equal bilaterally.  Resp: Normal respiratory rate and effort. CTAB, no wheezes, rhonchi, crackles.  Abd: Generalized mild abdominal tenderness palpation.  Soft, non-distended. No rebound tenderness or guarding.  GU: Deferred. MSK: No  peripheral edema or signs of trauma. Extremities without deformity or TTP. No cyanosis or clubbing. Skin: Pale, warm, dry. Neuro: A&Ox4, CNs II-XII grossly intact. MAEs. Sensation grossly intact.  Psych: Normal mood and affect.   ED Results / Procedures / Treatments   Labs (all labs ordered are listed, but only abnormal results are displayed) Labs Reviewed  CBC WITH DIFFERENTIAL/PLATELET - Abnormal; Notable for the following components:      Result Value   RBC 3.37 (*)    Hemoglobin 9.5 (*)    HCT 32.5 (*)    MCHC 29.2 (*)    RDW 17.4 (*)    All other components within normal limits  COMPREHENSIVE METABOLIC PANEL - Abnormal; Notable for the following components:   BUN 30 (*)    Calcium  8.8 (*)    Total Protein 6.4 (*)    Albumin 3.3 (*)    All other components within normal limits  URINALYSIS, ROUTINE W REFLEX MICROSCOPIC - Abnormal; Notable for the following components:   Color, Urine AMBER (*)    APPearance CLOUDY (*)    Hgb urine dipstick LARGE (*)    Protein, ur 100 (*)    Bacteria, UA RARE (*)    All other components within normal limits  URINE CULTURE  MAGNESIUM  PROTIME-INR  APTT  CBG MONITORING, ED  TYPE AND SCREEN  ABO/RH    EKG EKG Interpretation  Date/Time:  Monday June 03 2023 09:15:21 EDT Ventricular Rate:  81 PR Interval:  165 QRS Duration: 137 QT Interval:  387 QTC Calculation: 450 R Axis:   24 Text Interpretation: Sinus rhythm Nonspecific intraventricular conduction delay Artifact in lead(s) II III aVR aVL aVF V1 V2 Confirmed by Vivi Barrack [phone]) on 06/03/2023 10:35:13 AM  Radiology CT ABDOMEN PELVIS W CONTRAST  Result Date: 06/03/2023 CLINICAL DATA:  Pain history of ulcerative colitis. EXAM: CT ABDOMEN AND PELVIS WITH CONTRAST TECHNIQUE: Multidetector CT imaging of the abdomen and pelvis was performed using the standard protocol following bolus administration of intravenous contrast. RADIATION DOSE REDUCTION: This exam was performed according  to the departmental dose-optimization program which includes automated exposure control, adjustment of the mA and/or kV according to patient size and/or use of iterative reconstruction technique. CONTRAST:  OMNIPAQUE IOHEXOL 300 MG/ML  SOLN COMPARISON:  None Available. FINDINGS: Lower chest: Breathing motion at the lung bases. Patient is status post median sternotomy. Coronary artery calcifications are seen. Elevated left hemidiaphragm. Calcified nodule left lower lobe series 5, image 28 consistent with old granulomatous disease. Hepatobiliary: No space-occupying liver lesion. Patent portal vein. Gallbladder is nondilated. Pancreas: Unremarkable. No pancreatic ductal dilatation or surrounding inflammatory changes. Spleen: Normal in size without focal abnormality. Adrenals/Urinary Tract: Slight nonspecific nodularity of the adrenal glands. The left kidney is absent. The remnant distal left ureter is mildly dilated. Nonobstructing right-sided renal stone along the renal pelvis measuring 8 mm on series 3, image 40. No separate enhancing right-sided renal mass or right-sided renal collecting system dilatation. The bladder however has multiple mass lesions along the lumen diffusely distributed. Please correlate for any known history including for potential bladder cancer. Area of dystrophic calcification as well along the posterior aspect of the the bladder on the right side near the margin of the UVJ. Stomach/Bowel: No oral contrast. Stomach is nondilated. Small bowel is nondilated. There is a large left inguinal hernia involving a significant portion of the sigmoid colon. This a wide mouth hernia. No signs of obstruction. Few areas of colonic diverticula. No specific colonic wall thickening, inflammatory stranding. The appendix is poorly seen in the right lower quadrant but no pericecal stranding or fluid. Vascular/Lymphatic: Aortic atherosclerosis. No enlarged abdominal or pelvic lymph nodes. Reproductive:  Prostate is unremarkable. Other: Small fat containing umbilical hernia Musculoskeletal: Curvature of the spine. Scattered degenerative changes of the spine and pelvis. IMPRESSION: Multiple mass lesions along the bladder lumen with some areas of dystrophic calcification near the margin of the right UVJ. Please correlate for any known history. Neoplasm is possible recommend further workup when appropriate otherwise. Absence of the left kidney. Nonobstructing right-sided renal stone. No bowel obstruction, free air or free fluid. Large left inguinal hernia involving sigmoid colon. Electronically Signed   By: Karen Kays M.D.   On: 06/03/2023 11:13    Procedures Procedures  Medications Ordered in ED Medications  iohexol (OMNIPAQUE) 300 MG/ML solution 100 mL (100 mLs Intravenous Contrast Given 06/03/23 1027)  lactated ringers bolus 1,000 mL (0 mLs Intravenous Stopped 06/03/23 1434)  cefTRIAXone (ROCEPHIN) 1 g in sodium chloride 0.9 % 100 mL IVPB (0 g Intravenous Stopped 06/03/23 1434)    ED Course/ Medical Decision Making/ A&P                          Medical Decision Making Amount and/or Complexity of Data Reviewed Labs: ordered. Decision-making details documented in ED Course. Radiology: ordered. Decision-making details documented in ED Course.  Risk Prescription drug management. Decision regarding hospitalization.    This patient presents to the ED for concern of pre-syncope and c/f UC flare; this involves an extensive number of treatment options, and is a complaint that carries with it a high risk of complications and morbidity.  I considered the following differential and admission for this acute, potentially life threatening condition. Patient is HDS, afebrile at this time.   MDM:    DDX for syncope includes but is not limited to:  Consider anemia and electrolyte abnormalities as possible etiologies given diarrhea/hematochezia. Pt Hgb was 9.3 on 6/14 and he is pale appearing, will  draw T&S as well. Does take eliquis for afib. Reportedly is having UC flare at this time, w/ diffuse abd TTP and pain will get CT scan to r/o intraabdominal infection. Consider arrhythmias and ACS, although without associated symptoms, less likely. Consider hemorrhage vs CVA, although neuro intact now and no associated other symptoms. Given history, exam and workup, low suspicion for HF, ICH (no trauma, headache), seizure (no witnessed seizure like activity, no postictal period, tongue laceration, bladder incontinence), stroke (no focal neuro deficits), ACS (no anginal pain), aortic dissection (no chest pain), malignant arrhythmia on ekg or any family history of sudden death, or GI bleed (stable hgb). Low suspicion for PE given normal vital signs, absence of chest pain or dyspnea, no evidence of DVT, no recent surgery/immobilization.   Clinical Course as of 06/03/23 1609  Mon Jun 03, 2023  1033 Hemoglobin(!): 9.5 Stable from a few days ago. Decreased from 11s three months ago. [HN]  1033 INR: 1.0 [HN]  1033 Prothrombin Time: 13.7 [HN]  1034 BUN(!): 30 Mildly elevated and mildly increasing, indicate dehydration [HN]  1034 Creatinine: 0.81 At BL [HN]  1034 Glucose-Capillary: 94 wnl [HN]  1034 Magnesium: 2.0 [HN]  1101 Urinalysis, Routine w reflex microscopic -Urine, Clean Catch(!) +UTI w/ mucous, budding yeast. [HN]  1148 CT ABDOMEN PELVIS W CONTRAST Multiple mass lesions along the bladder lumen with some areas of dystrophic calcification near the margin of the right UVJ. Please correlate for any known history. Neoplasm is possible recommend further workup when appropriate otherwise.  Absence of the left kidney.  Nonobstructing right-sided renal stone.  No bowel obstruction, free air or free fluid.  Large left inguinal hernia involving sigmoid colon.   [HN]  1334 Per chart review was seen by Dr. Donette Larry with IM for hematuria and UTI recently in April and subsequently May, but I cannot  see the documentation from these encounters.  [HN]  1343 D/w Dr. Wilson Singer with urology. As long as he is voiding okay, can be DC'd for f/u. Will treat for UTI w/ cefpodoxime  [HN]  1401 Orthostatics positive. Will give another liter of fluid. [HN]  1501 Patient intermittently into afib w/ RVR with rates into the 120s with movement. BP still stable.  Patient likely requiring more fluids and colitis treatment. Has also dropped his Hgb 2 points over the last weeks so will need to speak with GI. Dr. Dulce Sellar made aware. Dr. Annabell Howells made aware of patient's admission. Dr. Robb Matar admitting.  [HN]    Clinical Course User Index [HN] Loetta Rough, MD    Labs: I Ordered, and personally interpreted labs.  The pertinent results include:  those listed above  Imaging Studies ordered: I ordered imaging studies including CT abd pelvis w contrast I independently visualized and interpreted imaging. I agree with the radiologist interpretation  Additional history obtained from chart review.  External records from outside source obtained and reviewed including [facility]  Cardiac Monitoring: The patient was maintained on a cardiac monitor.  I personally viewed and interpreted the cardiac monitored which showed an underlying rhythm of: NSR  Reevaluation: After the interventions noted above, I reevaluated the patient and found that they have :stayed the same  Social Determinants of Health: Patient lives independently alone in his home  Disposition:  Admit to hospitalist  Co morbidities that complicate the patient evaluation  Past Medical History:  Diagnosis Date   A-fib [facility])    Acute sinusitis 03/24/2012   Followed in Pulmonary clinic/ [facility]/ [name] - CT sinus 08/07/2013  Short air-fluid levels in the maxillary sinuses bilaterally suggesting early acute sinusitis. - repeat ct sinus 04/30/2014 > ok     Acute sinusitis, unspecified 08/14/2010   Qualifier: Diagnosis of  By: Clent Ridges NP, [name]     Asthma     PFTs 06/15/05 FEV1 85% predicted ratio 68% and truncation of resp loop in a sawtooth pattern. HFA 25% 08-01-2009 >50% Nov 01, 2009>50% 01-31-2010   Asthma, chronic 11/24/2007   Followed in Pulmonary clinic/ [facility]/ [name]  - PFTs 06/15/2005 FEV1 85% predicted ratio 68% and truncation of respiratory loop in a sawtooth pattern  - HFA 25% August 01, 2009 > 50% November 01, 2009 > 50% January 31, 2010 therefore changed to neb bud/brovana - HFA 75% p extensive coaching 08/07/2013  > insurance issues so try symbicort 160 2 bid instead of neb - 11/16/2015   p extensive   BRONCHITIS, ACUTE 01/22/2011   Qualifier: Diagnosis of  By: Clent Ridges NP, [name]     CAD (coronary artery disease), native coronary artery 02/06/2019   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   Cataract    Chronic cough    Chronic rhinitis 05/02/2009   Followed in Pulmonary clinic/ [facility]/ [name]    - 03/24/12 CT Sinus > Negative paranasal sinuses - Sinus CT 08/07/2013 >>Short air-fluid levels in the maxillary sinuses bilaterally suggesting early acute sinusitis - Repeat augmentin x 21 days 03/27/2014 then sinus ct if not better  - flonase/afrin regimen 02/11/15 >>>      Coronary heart disease    COUGH, CHRONIC 11/24/2007   Followed in Pulmonary clinic/ [facility]/ [name]      DDD (degenerative disc disease), lumbar    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   Retina disorder 2014  Severe obesity (BMI >= 40) (HCC) 11/24/2007       - Target wt < 191 to get under BMI 30    Ulcerative colitis    ULCERATIVE COLITIS  11/24/2007   Followed as Primary Care Patient/ GI/ Dr  Danise Edge     URI 04/09/2008   Qualifier: Diagnosis of  By: Clent Ridges NP, [name]       Medicines Meds ordered this encounter  Medications   iohexol (OMNIPAQUE) 300 MG/ML solution 100 mL   lactated ringers bolus 1,000 mL   cefTRIAXone (ROCEPHIN) 1 g in sodium chloride 0.9 % 100 mL IVPB    Order Specific Question:   Antibiotic Indication:    Answer:   UTI    I have reviewed the patients home medicines and have made adjustments as needed  Problem List / ED Course: Problem List Items Addressed This Visit   None Visit Diagnoses     Near syncope    -  Primary   Diarrhea, unspecified type       Urinary bladder neoplasm       Atrial fibrillation with rapid ventricular response (HCC)       Acute cystitis with hematuria                       This note was created using dictation software, which may contain spelling or grammatical errors.    Loetta Rough, MD 06/03/23 [id number]

## 2023-06-03 NOTE — ED Triage Notes (Signed)
BIB [facility] c/o dizziness upon standing up from his chair, lowered himself to the floor in front of his recliner. Colitis flare up for the past week. Denies LOC lives alone and does have someone check on him weekly.

## 2023-06-03 NOTE — H&P (Signed)
History and Physical    Patient: Barry [name]:[id number] DOB: 1946-09-26 DOA: 06/03/2023 DOS: the patient was seen and examined on 06/03/2023 PCP: Georgann Housekeeper, MD  Patient coming from: Home  Chief Complaint:  Chief Complaint  Patient presents with   Dizziness   HPI: Barry [name] is a 77 y.o. male with medical history significant of paroxysmal atrial fibrillation, history of acute sinusitis, asthma, history of acute bronchitis, CAD, CABG, cataracts, chronic cough, chronic rhinitis, GERD, glaucoma, hyperlipidemia, hypertension, class III obesity, osteoarthritis, left side paralyzed hemidiaphragm, retinal disorder, ulcerative colitis was coming to the emergency department with complaints of postural dizziness and 4 episodes of diarrhea yesterday with intermittent bright red blood per rectum.  The patient stated that in the morning he went to the bathroom and felt very lightheaded when he stood up.  He lowered himself to the floor to avoid injuries.  He denied LOC.  He has been having hematochezia since last week and thinks he has an ulcerative colitis flare.  He saw gastroenterology on Thursday, was prescribed prednisone, but still has not taken it.  He has also noticed episodes of hematuria in the last few days.  He has had low grade subjective fever, chills, fatigue and decreased appetite. He denied rhinorrhea, sore throat or hemoptysis.  He has had occasional wheezing.   No flank pain, but positive mild dysuria, frequency and hematuria. No emesis, diarrhea, constipation or melena.  No chest pain, palpitations, diaphoresis, PND, orthopnea or pitting edema of the lower extremities.  No polyuria, polydipsia, polyphagia or blurred vision.   ED course: Initial vital signs were temperature 98.2 F, pulse 90, respiration 14, BP 142/73 mmHg O2 sat 96% on room air.  The patient received 1000 mL of LR bolus and ceftriaxone 1 g IVPB.  GI and urology were consulted.  Lab work: Urine analysis was Hospital doctor  with cloudy appearance, large hemoglobin and protein of 100 mg/dL.  Microscopic examination showed more than 50 RBC, more than 50 WBC with rare bacteria and positive WBC clumps and budding yeast.  CBCs are white count 5.0, hemoglobin 9.5 g deciliter platelets 239.  Hemoglobin was 11.6 on 02/12/2023.  PT 13.7, INR 1.0 and PTT 30.  CMP showed normal electrolytes after calcium correction.  BUN was 30, creatinine 0.81 mg/dL.  Total protein 6.4 and albumin 3.3 g/dL.  The rest of the LFTs were normal.  Imaging: CT abdomen/pelvis with contrast showed mass lesions along the bladder lumen with some areas of dystrophic calcification near the margin of the right UVJ.  Correlate for any known history.  Neoplasm is possible and further workup recommended when appropriate.  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  Nonobstructing right-sided renal stone.  No bowel obstruction, free air or free fluid.  Large left inguinal hernia involving sigmoid colon.   Review of Systems: As mentioned in the history of present illness. All other systems reviewed and are negative.  Past Medical History:  Diagnosis Date   A-fib [facility])    Acute sinusitis 03/24/2012   Followed in Pulmonary clinic/ [facility]/ [name] - CT sinus 08/07/2013  Short air-fluid levels in the maxillary sinuses bilaterally suggesting early acute sinusitis. - repeat ct sinus 04/30/2014 > ok     Acute sinusitis, unspecified 08/14/2010   Qualifier: Diagnosis of  By: Clent Ridges NP, [name]     Asthma    PFTs 06/15/05 FEV1 85% predicted ratio 68% and truncation of resp loop in a sawtooth pattern. HFA 25% 08-01-2009 >50% Nov 01, 2009>50% 01-31-2010  Asthma, chronic 11/24/2007   Followed in Pulmonary clinic/ [facility]/ [name]  - PFTs 06/15/2005 FEV1 85% predicted ratio 68% and truncation of respiratory loop in a sawtooth pattern  - HFA 25% August 01, 2009 > 50% November 01, 2009 > 50% January 31, 2010 therefore changed to neb bud/brovana - HFA 75% p extensive coaching 08/07/2013   > insurance issues so try symbicort 160 2 bid instead of neb - 11/16/2015   p extensive   BRONCHITIS, ACUTE 01/22/2011   Qualifier: Diagnosis of  By: Clent Ridges NP, [name]     CAD (coronary artery disease), native coronary artery 02/06/2019   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   Cataract    Chronic cough    Chronic rhinitis 05/02/2009   Followed in Pulmonary clinic/ [facility]/ [name]    - 03/24/12 CT Sinus > Negative paranasal sinuses - Sinus CT 08/07/2013 >>Short air-fluid levels in the maxillary sinuses bilaterally suggesting early acute sinusitis - Repeat augmentin x 21 days 03/27/2014 then sinus ct if not better  - flonase/afrin regimen 02/11/15 >>>      Coronary heart disease    COUGH, CHRONIC 11/24/2007   Followed in Pulmonary clinic/ [facility]/ [name]      DDD (degenerative disc disease), lumbar    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   [facility] [name]  -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     Excessive ear wax, bilateral 09/15/2018   GASTROESOPHAGEAL REFLUX DISEASE 08/27/2008   Followed as Primary Care Patient/ GI/ Dr  Danise Edge     Glaucoma    Hyperlipidemia LDL goal <70 02/06/2019   Hypertension    Obesity    Osteoarthritis    Paralyzed hemidiaphragm    left, 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   Retina disorder 2014   Severe obesity (BMI >= 40) (HCC) 11/24/2007       - Target wt < 191 to get under BMI 30    Ulcerative colitis    ULCERATIVE COLITIS 11/24/2007   Followed as Primary Care Patient/ GI/ Dr  Danise Edge     URI 04/09/2008   Qualifier: Diagnosis of  By: Clent Ridges NP, [name]     Past Surgical History:  Procedure Laterality Date   CARDIAC CATHETERIZATION Left    COLONOSCOPY WITH PROPOFOL N/A 11/29/2015   Procedure: COLONOSCOPY WITH PROPOFOL;  Surgeon: Charolett Bumpers, MD;  Location: [facility] ENDOSCOPY;  Service: Endoscopy;  Laterality: N/A;   CORONARY ARTERY BYPASS GRAFT     x6   Social History:  reports that he  has never smoked. He has never used smokeless tobacco. He reports that he does not drink alcohol and does not use drugs.  Allergies  Allergen Reactions   Piroxicam Itching and Rash    Family History  Problem Relation Age of Onset   Heart disease Father    Hypertension Mother    Renal Disease Mother    Pulmonary Hypertension Mother    Other Sister        UNKNOWN HISTORY    Prior to Admission medications   Medication Sig Start Date End Date Taking? Authorizing Provider  acetaminophen (TYLENOL) 500 MG tablet Take per bottle as needed for arthritis    [provider]  apixaban (ELIQUIS) 5 MG TABS tablet Take 1 tablet (5 mg total) by mouth 2 (two) times daily. 12/07/22   Wendall Stade, MD  atorvastatin (LIPITOR) 40 MG tablet TAKE 1 TABLET BY MOUTH EVERY DAY 03/04/23   Wendall Stade,  MD  betamethasone dipropionate 0.05 % lotion Apply topically as needed. As directed 01/30/21   [provider]  Bevacizumab (AVASTIN) 100 MG/4ML SOLN as directed Intravenous    [provider]  budesonide-formoterol (SYMBICORT) 160-4.5 MCG/ACT inhaler TAKE 2 PUFFS BY MOUTH TWICE A DAY 09/18/21   Nyoka Cowden, MD  carboxymethylcellulose (REFRESH PLUS) 0.5 % SOLN Use as needed for dry eye    [provider]  Cholecalciferol (VITAMIN D) 50 MCG (2000 UT) tablet Take 2,000 Units by mouth daily.     [provider]  cycloSPORINE (RESTASIS) 0.05 % ophthalmic emulsion Place 1 drop into both eyes 2 (two) times daily.    [provider]  diltiazem (CARDIZEM CD) 240 MG 24 hr capsule TAKE 1 CAPSULE BY MOUTH EVERY DAY 05/14/23   Wendall Stade, MD  docusate sodium (COLACE) 100 MG capsule Take 100 mg by mouth daily as needed for mild constipation.    [provider]  escitalopram (LEXAPRO) 10 MG tablet Take by mouth daily at 6 (six) AM. 07/01/22   [provider]  Eyelid Cleansers (OCUSOFT EYELID CLEANSING) PADS Apply topically as needed. Use daily     [provider]  ezetimibe (ZETIA) 10 MG tablet TAKE 1 TABLET BY MOUTH EVERY DAY 03/04/23   Wendall Stade, MD  Ferrous Sulfate (SLOW FE) 142 (45 Fe) MG TBCR 1 tablet Orally Once a day    [provider]  fexofenadine (ALLEGRA) 180 MG tablet Take 180 mg by mouth as needed for allergies.    [provider]  fluticasone (FLONASE) 50 MCG/ACT nasal spray Place 2 sprays into both nostrils 2 (two) times daily.     [provider]  latanoprost (XALATAN) 0.005 % ophthalmic solution Place 1 drop into both eyes at bedtime.    [provider]  losartan (COZAAR) 25 MG tablet TAKE 1 TABLET BY MOUTH EVERY DAY    [provider]  meloxicam (MOBIC) 15 MG tablet Take 15 mg by mouth daily. 07/25/22   [provider]  metoprolol tartrate (LOPRESSOR) 50 MG tablet TAKE 1 TABLET BY MOUTH TWICE A DAY 01/09/23   Wendall Stade, MD  montelukast (SINGULAIR) 10 MG tablet TAKE ONE TABLET BY MOUTH AT BEDTIME EVERY NIGHT 02/18/23   Nyoka Cowden, MD  Multiple Vitamins-Minerals (CENTRUM SILVER PO) Take 1 tablet by mouth daily.    [provider]  nitroGLYCERIN (NITROSTAT) 0.4 MG SL tablet Place 1 tablet (0.4 mg total) under the tongue every 5 (five) minutes as needed for chest pain. 01/12/21   Wendall Stade, MD  omeprazole (PRILOSEC) 20 MG capsule Take 20 mg by mouth 2 (two) times daily before a meal.    [provider]  Polyethyl Glyc-Propyl Glyc PF 0.4-0.3 % SOLN Apply 1 drop to eye as needed.    [provider]  Pseudoephedrine-Ibuprofen 30-200 MG TABS Per bottle as needed for sinus pressure/pain    [provider]  Spacer/Aero-Holding Chambers (AEROCHAMBER MV) inhaler Use as instructed 09/18/13   Parrett, Virgel Bouquet, NP  sulfaSALAzine (AZULFIDINE) 500 MG tablet Take 1,000 mg by mouth 4 (four) times daily.    [provider]  VENTOLIN HFA 108 (90 Base) MCG/ACT inhaler INHALE 2 PUFFS INTO THE LUNGS EVERY 4 HRS AS NEEDED FOR  WHEEZING OR SHORTNESS OF BREATH 03/19/19   Julio Sicks, NP    Physical Exam: Vitals:   06/03/23 0915 06/03/23 1230 06/03/23 1446  BP: (!) 142/73 (!) 141/73  Pulse: 98 85   Resp: 14 16   Temp: 98.2 F (36.8 C)  97.9 F (36.6 C)  TempSrc: Oral  Oral  SpO2: 96% 96%    Physical Exam Vitals and nursing note reviewed.  Constitutional:      General: He is awake. He is not in acute distress.    Appearance: Normal appearance. He is obese.  HENT:     Head: Normocephalic.     Nose: No rhinorrhea.     Mouth/Throat:     Mouth: Mucous membranes are moist.  Eyes:     General: No scleral icterus.    Pupils: Pupils are equal, round, and reactive to light.  Neck:     Vascular: No JVD.  Cardiovascular:     Rate and Rhythm: Normal rate and regular rhythm.     Heart sounds: S1 normal and S2 normal.  Pulmonary:     Effort: Pulmonary effort is normal.     Breath sounds: Normal breath sounds. No wheezing, rhonchi or rales.  Abdominal:     General: Bowel sounds are normal.     Palpations: Abdomen is soft.     Tenderness: There is abdominal tenderness in the suprapubic area. There is right CVA tenderness and left CVA tenderness. There is no guarding or rebound.     Comments: Mild bilateral CVA.  Musculoskeletal:     Cervical back: Neck supple.     Right lower leg: No edema.     Left lower leg: No edema.  Skin:    General: Skin is warm and dry.     Comments: Scattered small areas of ecchymosis  Neurological:     General: No focal deficit present.     Mental Status: He is alert and oriented to person, place, and time.  Psychiatric:        Mood and Affect: Mood normal.        Behavior: Behavior normal. Behavior is cooperative.    Data Reviewed:   Results are pending, will review when available.  01/09/2023 transthoracic echocardiogram IMPRESSIONS:   1. Left ventricular ejection fraction, by estimation, is 50 to 55%. The  left ventricle has low normal function. The left  ventricle demonstrates  regional wall motion abnormalities (see scoring diagram/findings for  description). Left ventricular diastolic   parameters are indeterminate.   2. Right ventricular systolic function is normal. The right ventricular  size is normal.   3. The mitral valve is normal in structure. Trivial mitral valve  regurgitation. No evidence of mitral stenosis.   4. The aortic valve is normal in structure. Aortic valve regurgitation is  trivial. Aortic valve sclerosis/calcification is present, without any  evidence of aortic stenosis.   5. The inferior vena cava is normal in size with greater than 50%  respiratory variability, suggesting right atrial pressure of 3 mmHg.   EKG: Vent. rate 81 BPM PR interval 165 ms QRS duration 137 ms QT/QTcB 387/450 ms P-R-T axes 50 24 53 Sinus rhythm Nonspecific intraventricular conduction delay Artifact in lead(s) II III aVR aVL aVF V1 V2  Assessment and Plan: Principal Problem:   Complicated UTI (urinary tract infection) In the setting of:   Bladder mass Admit to telemetry/inpatient. Continue ceftriaxone 1 g IVPB daily. Follow-up urine culture and sensitivity. Urology has been consulted.  Active Problems:   Lower GI bleed In the setting of:   ULCERATIVE COLITIS Monitor hematocrit and hemoglobin. Transfuse as needed. Hold DOAC for now. Continue sulfasalazine 1000 mg p.o. 4 times daily. Gastroenterology  will evaluate in the morning. Clear liquid diet. N.p.o. at midnight.    Severe obesity (BMI >= 40) (HCC) Current BMI 37.30 kg/m. Lifestyle modifications. Follow-up with primary care provider.    Asthma, chronic Continue Symbicort or formulary equivalent. Short acting beta agonist as needed. Supplemental oxygen as needed.    GASTROESOPHAGEAL REFLUX DISEASE Begin pantoprazole 40 mg p.o. daily.    CAD (coronary artery disease), native coronary artery Continue beta-blocker, atorvastatin and ezetimibe. Holding apixaban  in the setting of GI bleeding. Sublingual nitroglycerin as needed.    Benign essential HTN Continue diltiazem 240 mg p.o. daily. Continue metoprolol 50 mg p.o. twice daily. Continue losartan 25 mg p.o. daily.    Hyperlipidemia LDL goal <70 Continue atorvastatin 40 mg p.o. daily. Continue ezetimibe 10 mg p.o. daily.    Paroxysmal atrial fibrillation (HCC) CHA2DS2-VASc Score of at least 5. Continue beta-blocker and CCB.    Pressure injury of skin Continue preventive measures.       Advance Care Planning:   Code Status: Full Code   Consults: GI Willis Modena, MD) Urology Bjorn Pippin, MD)  Family Communication:   Severity of Illness: The appropriate patient status for this patient is INPATIENT. Inpatient status is judged to be reasonable and necessary in order to provide the required intensity of service to ensure the patient's safety. The patient's presenting symptoms, physical exam findings, and initial radiographic and laboratory data in the context of their chronic comorbidities is felt to place them at high risk for further clinical deterioration. Furthermore, it is not anticipated that the patient will be medically stable for discharge from the hospital within 2 midnights of admission.   * I certify that at the point of admission it is my clinical judgment that the patient will require inpatient hospital care spanning beyond 2 midnights from the point of admission due to high intensity of service, high risk for further deterioration and high frequency of surveillance required.*  Author: Bobette Mo, MD 06/03/2023 3:40 PM  For on call review [email or web address].   This document was prepared using Dragon voice recognition software and may contain some unintended transcription errors.

## 2023-06-03 NOTE — Plan of Care (Signed)
  Problem: Education: Goal: Knowledge of General Education information will improve Description: Including pain rating scale, medication(s)/side effects and non-pharmacologic comfort measures Outcome: Progressing   Problem: Health Behavior/Discharge Planning: Goal: Ability to manage health-related needs will improve Outcome: Progressing   Problem: Clinical Measurements: Goal: Ability to maintain clinical measurements within normal limits will improve Outcome: Progressing Goal: Will remain free from infection Outcome: Progressing Goal: Diagnostic test results will improve Outcome: Progressing Goal: Respiratory complications will improve Outcome: Progressing Goal: Cardiovascular complication will be avoided Outcome: Progressing   Problem: Nutrition: Goal: Adequate nutrition will be maintained Outcome: Progressing   Problem: Elimination: Goal: Will not experience complications related to bowel motility Outcome: Progressing

## 2023-06-03 NOTE — ED Notes (Signed)
Pt states he can't stand for long periods during orthostatic vitals.

## 2023-06-04 ENCOUNTER — Inpatient Hospital Stay (HOSPITAL_COMMUNITY): Payer: Medicare Other

## 2023-06-04 ENCOUNTER — Encounter (HOSPITAL_COMMUNITY): Payer: Self-pay | Admitting: Cardiology

## 2023-06-04 DIAGNOSIS — Z951 Presence of aortocoronary bypass graft: Secondary | ICD-10-CM

## 2023-06-04 DIAGNOSIS — I480 Paroxysmal atrial fibrillation: Secondary | ICD-10-CM | POA: Diagnosis not present

## 2023-06-04 DIAGNOSIS — N390 Urinary tract infection, site not specified: Secondary | ICD-10-CM | POA: Diagnosis not present

## 2023-06-04 DIAGNOSIS — Z01810 Encounter for preprocedural cardiovascular examination: Secondary | ICD-10-CM | POA: Diagnosis not present

## 2023-06-04 LAB — COMPREHENSIVE METABOLIC PANEL
ALT: 11 U/L (ref 0–44)
AST: 27 U/L (ref 15–41)
Albumin: 3.1 g/dL — ABNORMAL LOW (ref 3.5–5.0)
Alkaline Phosphatase: 56 U/L (ref 38–126)
Anion gap: 10 (ref 5–15)
BUN: 24 mg/dL — ABNORMAL HIGH (ref 8–23)
CO2: 22 mmol/L (ref 22–32)
Calcium: 8.9 mg/dL (ref 8.9–10.3)
Chloride: 108 mmol/L (ref 98–111)
Creatinine, Ser: 0.85 mg/dL (ref 0.61–1.24)
GFR, Estimated: >60 mL/min (ref >60)
Glucose, Bld: 101 mg/dL — ABNORMAL HIGH (ref 70–99)
Potassium: 4.4 mmol/L (ref 3.5–5.1)
Sodium: 140 mmol/L (ref 135–145)
Total Bilirubin: 1.1 mg/dL (ref 0.3–1.2)
Total Protein: 6 g/dL — ABNORMAL LOW (ref 6.5–8.1)

## 2023-06-04 LAB — CBC
HCT: 33.8 % — ABNORMAL LOW (ref 39.0–52.0)
Hemoglobin: 10.1 g/dL — ABNORMAL LOW (ref 13.0–17.0)
MCH: 28.5 pg (ref 26.0–34.0)
MCHC: 29.9 g/dL — ABNORMAL LOW (ref 30.0–36.0)
MCV: 95.5 fL (ref 80.0–100.0)
Platelets: 228 10*3/uL (ref 150–400)
RBC: 3.54 MIL/uL — ABNORMAL LOW (ref 4.22–5.81)
RDW: 17.5 % — ABNORMAL HIGH (ref 11.5–15.5)
WBC: 5.7 10*3/uL (ref 4.0–10.5)
nRBC: 0 % (ref 0.0–0.2)

## 2023-06-04 LAB — URINE CULTURE: Culture: <10000 — AB

## 2023-06-04 MED ORDER — ALBUTEROL SULFATE (2.5 MG/3ML) 0.083% IN NEBU: 2.5000 mg | INHALATION_SOLUTION | RESPIRATORY_TRACT | Status: DC | PRN

## 2023-06-04 MED ORDER — MELATONIN 5 MG PO TABS
5.0000 mg | ORAL_TABLET | Freq: Every evening | ORAL | Status: AC | PRN
  Administered 2023-06-04 – 2023-06-05 (×2): 5 mg via ORAL
  Filled 2023-06-04 (×2): qty 1

## 2023-06-04 NOTE — Consult Note (Addendum)
Cardiology Consultation   Patient ID: Barry [name] MRN: [id number]; DOB: [birth year]  Admit date: 06/03/2023 Date of Consult: 06/04/2023  PCP:  Georgann Housekeeper, MD   [facility] Providers Cardiologist:  Charlton Haws, MD        Patient Profile:   Barry [name] is a 77 y.o. 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 Jerolyn Center and Dr. Annabell Howells.  History of Present Illness:   Mr. Mckaig with above hx including atrial fib dx in 2023 on eliquis for CHA2DS2Vasc of 4.  BB increased and pt spontaneously converted.   Hx of CABG 07/2000 and last nuc study 2023 was low risk.  No evidence of ischemia.  EF was 57%.  Last echo 01/09/23 with EF 50-55%, + RWMA.  RV systolic function and size normal.  Trivial MR , trivial AR.  Pt presented to ER 6/17 with orthostatic hypotension and diarrhea.  + bright red blood at times as well.  He had episodes of being very lightheaded.  No syncope.  He thought he was having UC flare and saw GI but had not taken the steroids ordered.   Hgb was 9.5 and EBC 5, plts 239.  Hgb in 01/2023 was 11.6  BUN was 30 and Cr 0.81  CT abd and pelvix with lesion along bladder lumen with some areas of dystrophic calcification near the margin of the right UVJ.  Absence of Left kidney.  Non obstructing Rt sided renal stone.  No bowel obstruction, free air or free fluid. Large left inguinal hernia involving sigmoid colon.       Pt denies chest pain only his normal mild discomfort but no changes.  He has some dyspnea but is chronic and no changes.    Urine with large hgb protein 100 nitrite neg.   EKG:  The EKG was personally reviewed and demonstrates:  SR at 81 though artifact throughout no acute ST changes no acute changes from EKG 01/11/23.   Telemetry:  Telemetry was personally reviewed and demonstrates:  SR with rare PVC. At times freq PACs with short bursts  Orthostatic VS  lying BP 143/91  P 85 Sitting  BP 154/85  P 110 Standing BP 128/81 P 115 Standing 3 min BP 163/94  P 126    VS this AM BP 137/99 P 91 R 20  afebrile.   Urology has seen with plans for TURBT once recovered from acute illness.  With the bladder lesions, he might need a percutaneous approach if the ureteral orifice is involved with tumor.  ABX has been added.   GI to see for lower GI bleed with UC.    Past Medical History:  Diagnosis Date   A-fib [facility])    Acute sinusitis 03/24/2012   Followed in Pulmonary clinic/ [facility]/ [name] - CT sinus 08/07/2013  Short air-fluid levels in the maxillary sinuses bilaterally suggesting early acute sinusitis. - repeat ct sinus 04/30/2014 > ok     Acute sinusitis, unspecified 08/14/2010   Qualifier: Diagnosis of  By: Clent Ridges NP, [name]     Asthma    PFTs 06/15/05 FEV1 85% predicted ratio 68% and truncation of resp loop in a sawtooth pattern. HFA 25% 08-01-2009 >50% Nov 01, 2009>50% 01-31-2010   Asthma, chronic 11/24/2007  Followed in Pulmonary clinic/ [facility]/ [name]  - PFTs 06/15/2005 FEV1 85% predicted ratio 68% and truncation of respiratory loop in a sawtooth pattern  - HFA 25% August 01, 2009 > 50% November 01, 2009 > 50% January 31, 2010 therefore changed to neb bud/brovana - HFA 75% p extensive coaching 08/07/2013  > insurance issues so try symbicort 160 2 bid instead of neb - 11/16/2015   p extensive   BRONCHITIS, ACUTE 01/22/2011   Qualifier: Diagnosis of  By: Clent Ridges NP, [name]     CAD (coronary artery disease), native coronary artery 02/06/2019   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   Cataract    Chronic cough    Chronic rhinitis 05/02/2009   Followed in Pulmonary clinic/ [facility]/ [name]    - 03/24/12 CT Sinus > Negative paranasal sinuses - Sinus CT 08/07/2013 >>Short air-fluid  levels in the maxillary sinuses bilaterally suggesting early acute sinusitis - Repeat augmentin x 21 days 03/27/2014 then sinus ct if not better  - flonase/afrin regimen 02/11/15 >>>      Coronary heart disease    COUGH, CHRONIC 11/24/2007   Followed in Pulmonary clinic/ [facility]/ [name]      DDD (degenerative disc disease), lumbar    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   Followed in Pulmonary clinic/ [facility]/ [name]  -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     Excessive ear wax, bilateral 09/15/2018   GASTROESOPHAGEAL REFLUX DISEASE 08/27/2008   Followed as Primary Care Patient/ GI/ Dr  Danise Edge     Glaucoma    Hyperlipidemia LDL goal <70 02/06/2019   Hypertension    Obesity    Osteoarthritis    Paralyzed hemidiaphragm    left, 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   Retina disorder 2014   Severe obesity (BMI >= 40) (HCC) 11/24/2007       - Target wt < 191 to get under BMI 30    Ulcerative colitis    ULCERATIVE COLITIS 11/24/2007   Followed as Primary Care Patient/ GI/ Dr  Danise Edge     URI 04/09/2008   Qualifier: Diagnosis of  By: Clent Ridges NP, [name]      Past Surgical History:  Procedure Laterality Date   CARDIAC CATHETERIZATION Left    COLONOSCOPY WITH PROPOFOL N/A 11/29/2015   Procedure: COLONOSCOPY WITH PROPOFOL;  Surgeon: Charolett Bumpers, MD;  Location: [facility] END[facility]COPY;  Service: Endoscopy;  Laterality: N/A;   CORONARY ARTERY BYPASS GRAFT     x6     Home Medications:  Prior to Admission medications   Medication Sig Start Date End Date Taking? Authorizing Provider  acetaminophen (TYLENOL) 500 MG tablet Take per bottle as needed for arthritis   Yes [provider]  apixaban (ELIQUIS) 5 MG TABS tablet Take 1 tablet (5 mg total) by mouth 2 (two) times daily. 12/07/22  Yes Wendall Stade, MD  atorvastatin (LIPITOR) 40 MG tablet TAKE 1 TABLET BY MOUTH EVERY DAY 03/04/23  Yes Wendall Stade, MD  budesonide-formoterol (SYMBICORT) 160-4.5 MCG/ACT inhaler TAKE 2 PUFFS  BY MOUTH TWICE A DAY 09/18/21  Yes Nyoka Cowden, MD  carboxymethylcellulose (REFRESH PLUS) 0.5 % SOLN Use as needed for dry eye   Yes [provider]  Cholecalciferol (VITAMIN D) 50 MCG (2000 UT) tablet Take 2,000 Units by mouth daily.    Yes [provider]  cycloSPORINE (RESTASIS) 0.05 % ophthalmic emulsion Place 1 drop into both eyes 2 (two) times daily.   Yes [provider]  diltiazem (  CARDIZEM CD) 240 MG 24 hr capsule TAKE 1 CAPSULE BY MOUTH EVERY DAY 05/14/23  Yes Wendall Stade, MD  escitalopram (LEXAPRO) 20 MG tablet Take 20 mg by mouth daily.   Yes [provider]  Eyelid Cleansers (OCUSOFT EYELID CLEANSING) PADS Apply topically as needed. Use daily   Yes [provider]  ezetimibe (ZETIA) 10 MG tablet TAKE 1 TABLET BY MOUTH EVERY DAY 03/04/23  Yes Wendall Stade, MD  Ferrous Sulfate (SLOW FE) 142 (45 Fe) MG TBCR 1 tablet Orally Once a day   Yes [provider]  latanoprost (XALATAN) 0.005 % ophthalmic solution Place 1 drop into both eyes at bedtime.   Yes [provider]  losartan (COZAAR) 25 MG tablet TAKE 1 TABLET BY MOUTH EVERY DAY   Yes [provider]  metoprolol tartrate (LOPRESSOR) 50 MG tablet TAKE 1 TABLET BY MOUTH TWICE A DAY 01/09/23  Yes Wendall Stade, MD  montelukast (SINGULAIR) 10 MG tablet TAKE ONE TABLET BY MOUTH AT BEDTIME EVERY NIGHT 02/18/23  Yes Nyoka Cowden, MD  Multiple Vitamins-Minerals (CENTRUM SILVER PO) Take 1 tablet by mouth daily.   Yes [provider]  sulfaSALAzine (AZULFIDINE) 500 MG tablet Take 1,000 mg by mouth 4 (four) times daily.   Yes [provider]  VENTOLIN HFA 108 (90 Base) MCG/ACT inhaler INHALE 2 PUFFS INTO THE LUNGS EVERY 4 HRS AS NEEDED FOR WHEEZING OR SHORTNESS OF BREATH Patient taking differently: Inhale 2 puffs into the lungs every 4 (four) hours as needed for wheezing or shortness of breath. 03/19/19  Yes Parrett, [name] S, NP  nitroGLYCERIN  (NITROSTAT) 0.4 MG SL tablet Place 1 tablet (0.4 mg total) under the tongue every 5 (five) minutes as needed for chest pain. 01/12/21   Wendall Stade, MD    Inpatient Medications: Scheduled Meds:  atorvastatin  40 mg Oral Daily   cycloSPORINE  1 drop Both Eyes BID   diltiazem  240 mg Oral Daily   escitalopram  20 mg Oral Daily   ezetimibe  10 mg Oral Daily   latanoprost  1 drop Both Eyes QHS   losartan  25 mg Oral Daily   metoprolol tartrate  50 mg Oral BID   mometasone-formoterol  2 puff Inhalation BID   pantoprazole  40 mg Oral Daily   sulfaSALAzine  1,000 mg Oral QID   Continuous Infusions:  cefTRIAXone (ROCEPHIN)  IV     PRN Meds: acetaminophen **OR** acetaminophen, albuterol, nitroGLYCERIN, ondansetron **OR** ondansetron (ZOFRAN) IV, polyvinyl alcohol  Allergies:    Allergies  Allergen Reactions   Piroxicam Itching and Rash    Social History:   Social History   Socioeconomic History   Marital status: Single    Spouse name: Not on file   Number of children: 0   Years of education: Not on file   Highest education level: Not on file  Occupational History   Occupation: disabled, prev worked in Designer, fashion/clothing  Tobacco Use   Smoking status: Never   Smokeless tobacco: Never  Substance and Sexual Activity   Alcohol use: No   Drug use: No   Sexual activity: Not on file  Other Topics Concern   Not on file  Social History Narrative   ** Merged History Encounter **       Social Determinants of Health   Financial Resource Strain: Not on file  Food Insecurity: No Food Insecurity (06/03/2023)   Hunger Vital Sign    Worried About Running Out of  Food in the Last Year: Never true    Ran Out of Food in the Last Year: Never true  Transportation Needs: No Transportation Needs (06/03/2023)   PRAPARE - Administrator, Civil Service (Medical): No    Lack of Transportation (Non-Medical): No  Physical Activity: Not on file  Stress: Not on file  Social Connections: Not  on file  Intimate Partner Violence: Not At Risk (06/03/2023)   Humiliation, Afraid, Rape, and Kick questionnaire    Fear of Current or Ex-Partner: No    Emotionally Abused: No    Physically Abused: No    Sexually Abused: No    Family History:    Family History  Problem Relation Age of Onset   Heart disease Father    Hypertension Mother    Renal Disease Mother    Pulmonary Hypertension Mother    Other Sister        UNKNOWN HISTORY     ROS:  Please see the history of present illness.  General:no colds or fevers, no weight changes Skin:no rashes or ulcers HEENT:no blurred vision, no congestion CV:see HPI PUL:see HPI GI:+ diarrhea no constipation + melena, no indigestion GU:no hematuria, no dysuria + bladder lesions on CT MS:+ joint pain left knee with fall yesterday, no claudication walks with walker and has fallen several times while going down hill   Neuro:no syncope, + lightheadedness with + orthostatic hypotension with drop in Hgb Endo:no diabetes, no thyroid disease  All other ROS reviewed and negative.     Physical Exam/Data:   Vitals:   06/03/23 2139 06/04/23 0159 06/04/23 0527 06/04/23 0744  BP: (!) 154/97 139/79 (!) 137/99   Pulse: 66 81 84   Resp: 20 20 20    Temp: 98.4 F (36.9 C) 98.2 F (36.8 C) 98 F (36.7 C)   TempSrc: Oral Oral Oral   SpO2: 96% 94% 93% 94%  Weight:      Height:        Intake/Output Summary (Last 24 hours) at 06/04/2023 0801 Last data filed at 06/04/2023 0531 Gross per 24 hour  Intake 1300 ml  Output 2750 ml  Net -1450 ml      06/03/2023    5:52 PM 03/13/2023   11:14 AM 01/11/2023   10:17 AM  Last 3 Weights  Weight (lbs) 210 lb 8.6 oz 210 lb 6.4 oz 211 lb 3.2 oz  Weight (kg) 95.5 kg 95.437 kg 95.8 kg     Body mass index is 37.3 kg/m.  General:  Well nourished, well developed, in no acute distress HEENT: normal Neck: no JVD Vascular: No carotid bruits; Distal pulses 2+ bilaterally Cardiac:  normal S1, S2; RRR; no murmur  gallup rub or click Lungs:  clear to auscultation bilaterally, occ wheezing, no rhonchi or rales  Abd: soft, nontender, no hepatomegaly  Ext: no edema- ice to left knee Musculoskeletal:  No deformities, BUE and BLE strength normal and equal Skin: warm and dry  Neuro:  alert and oriented X 3 MAE follow commands, no focal abnormalities noted Psych:  Normal affect   Relevant CV Studies: 01/09/23 echo IMPRESSIONS     1. Left ventricular ejection fraction, by estimation, is 50 to 55%. The  left ventricle has low normal function. The left ventricle demonstrates  regional wall motion abnormalities (see scoring diagram/findings for  description). Left ventricular diastolic   parameters are indeterminate.   2. Right ventricular systolic function is normal. The right ventricular  size is normal.   3.  The mitral valve is normal in structure. Trivial mitral valve  regurgitation. No evidence of mitral stenosis.   4. The aortic valve is normal in structure. Aortic valve regurgitation is  trivial. Aortic valve sclerosis/calcification is present, without any  evidence of aortic stenosis.   5. The inferior vena cava is normal in size with greater than 50%  respiratory variability, suggesting right atrial pressure of 3 mmHg.   Comparison(s): A prior study was performed on 04/05/22. No significant  change from prior study.   FINDINGS   Left Ventricle: Left ventricular ejection fraction, by estimation, is 50  to 55%. The left ventricle has low normal function. The left ventricle  demonstrates regional wall motion abnormalities. The left ventricular  internal cavity size was normal in  size. There is no left ventricular hypertrophy. Left ventricular diastolic  parameters are indeterminate.     LV Wall Scoring:  The inferior wall and basal inferolateral segment are hypokinetic. The  entire  anterior wall, antero-lateral wall, mid and distal lateral wall, entire  septum, and entire apex are  normal.   Right Ventricle: The right ventricular size is normal. No increase in  right ventricular wall thickness. Right ventricular systolic function is  normal.   Left Atrium: Left atrial size was normal in size.   Right Atrium: Right atrial size was normal in size.   Pericardium: There is no evidence of pericardial effusion.   Mitral Valve: The mitral valve is normal in structure. Trivial mitral  valve regurgitation. No evidence of mitral valve stenosis.   Tricuspid Valve: The tricuspid valve is normal in structure. Tricuspid  valve regurgitation is not demonstrated. No evidence of tricuspid  stenosis.   Aortic Valve: The aortic valve is normal in structure. Aortic valve  regurgitation is trivial. Aortic valve sclerosis/calcification is present,  without any evidence of aortic stenosis.   Pulmonic Valve: The pulmonic valve was normal in structure. Pulmonic valve  regurgitation is not visualized. No evidence of pulmonic stenosis.   Aorta: The aortic root is normal in size and structure.   Venous: The inferior vena cava is normal in size with greater than 50%  respiratory variability, suggesting right atrial pressure of 3 mmHg.   IAS/Shunts: No atrial level shunt detected by color flow Doppler.     Nuc study 08/13/22    No ST deviation was noted. The ECG was not diagnostic due to pharmacologic protocol.   Diaphragmatic attenuation artifact was present.   LV perfusion is abnormal. Defect 1: There is a small defect with mild reduction in uptake present in the mid to basal inferior location(s) that is fixed. There is normal wall motion in the defect area. Consistent with artifact caused by diaphragmatic attenuation.   Left ventricular function is normal. Nuclear stress EF: 57 %. The left ventricular ejection fraction is normal (55-65%). End diastolic cavity size is normal. End systolic cavity size is normal.   Prior study not available for comparison.   Findings are consistent  with no ischemia. The study is low risk.    Laboratory Data:  High Sensitivity Troponin:  No results for input(s): "TROPONINIHS" in the last 720 hours.   Chemistry Recent Labs  Lab 06/03/23 0920 06/04/23 0453  NA 141 140  K 3.8 4.4  CL 109 108  CO2 22 22  GLUCOSE 98 101*  BUN 30* 24*  CREATININE 0.81 0.85  CALCIUM 8.8* 8.9  MG 2.0  --   GFRNONAA >60 >60  ANIONGAP 10 10    Recent  Labs  Lab 06/03/23 0920 06/04/23 0453  PROT 6.4* 6.0*  ALBUMIN 3.3* 3.1*  AST 18 27  ALT 16 11  ALKPHOS 59 56  BILITOT 0.3 1.1   Lipids No results for input(s): "CHOL", "TRIG", "HDL", "LABVLDL", "LDLCALC", "CHOLHDL" in the last 168 hours.  Hematology Recent Labs  Lab 06/03/23 0920 06/03/23 1745 06/04/23 0453  WBC 5.0  --  5.7  RBC 3.37*  --  3.54*  HGB 9.5* 9.9* 10.1*  HCT 32.5* 33.0* 33.8*  MCV 96.4  --  95.5  MCH 28.2  --  28.5  MCHC 29.2*  --  29.9*  RDW 17.4*  --  17.5*  PLT 239  --  228   Thyroid No results for input(s): "TSH", "FREET4" in the last 168 hours.  BNPNo results for input(s): "BNP", "PROBNP" in the last 168 hours.  DDimer No results for input(s): "DDIMER" in the last 168 hours.   Radiology/Studies:  CT ABDOMEN PELVIS W CONTRAST  Result Date: 06/03/2023 CLINICAL DATA:  Pain history of ulcerative colitis. EXAM: CT ABDOMEN AND PELVIS WITH CONTRAST TECHNIQUE: Multidetector CT imaging of the abdomen and pelvis was performed using the standard protocol following bolus administration of intravenous contrast. RADIATION DOSE REDUCTION: This exam was performed according to the departmental dose-optimization program which includes automated exposure control, adjustment of the mA and/or kV according to patient size and/or use of iterative reconstruction technique. CONTRAST:  OMNIPAQUE IOHEXOL 300 MG/ML  SOLN COMPARISON:  None Available. FINDINGS: Lower chest: Breathing motion at the lung bases. Patient is status post median sternotomy. Coronary artery calcifications are  seen. Elevated left hemidiaphragm. Calcified nodule left lower lobe series 5, image 28 consistent with old granulomatous disease. Hepatobiliary: No space-occupying liver lesion. Patent portal vein. Gallbladder is nondilated. Pancreas: Unremarkable. No pancreatic ductal dilatation or surrounding inflammatory changes. Spleen: Normal in size without focal abnormality. Adrenals/Urinary Tract: Slight nonspecific nodularity of the adrenal glands. The left kidney is absent. The remnant distal left ureter is mildly dilated. Nonobstructing right-sided renal stone along the renal pelvis measuring 8 mm on series 3, image 40. No separate enhancing right-sided renal mass or right-sided renal collecting system dilatation. The bladder however has multiple mass lesions along the lumen diffusely distributed. Please correlate for any known history including for potential bladder cancer. Area of dystrophic calcification as well along the posterior aspect of the the bladder on the right side near the margin of the UVJ. Stomach/Bowel: No oral contrast. Stomach is nondilated. Small bowel is nondilated. There is a large left inguinal hernia involving a significant portion of the sigmoid colon. This a wide mouth hernia. No signs of obstruction. Few areas of colonic diverticula. No specific colonic wall thickening, inflammatory stranding. The appendix is poorly seen in the right lower quadrant but no pericecal stranding or fluid. Vascular/Lymphatic: Aortic atherosclerosis. No enlarged abdominal or pelvic lymph nodes. Reproductive: Prostate is unremarkable. Other: Small fat containing umbilical hernia Musculoskeletal: Curvature of the spine. Scattered degenerative changes of the spine and pelvis. IMPRESSION: Multiple mass lesions along the bladder lumen with some areas of dystrophic calcification near the margin of the right UVJ. Please correlate for any known history. Neoplasm is possible recommend further workup when appropriate otherwise.  Absence of the left kidney. Nonobstructing right-sided renal stone. No bowel obstruction, free air or free fluid. Large left inguinal hernia involving sigmoid colon. Electronically Signed   By: Karen Kays M.D.   On: 06/03/2023 11:13     Assessment and Plan:   Pre-op eval with  hx of CAD and CABG X 6 in 2001 with stable echo 12/2022 and no ischemia on nuc 2023.  No change in his mild angina and SOB.  Nuc in 2023 with no ischemia and no cardiac change since that time he has 6.6% risk of major cardiac event with surgery.   Dr. Eden Emms to see CAD as above and no angina, no asa on eliquis PAF maintaining SR.  Eliquis is held for now with acute GI bleed.  On BB and dilt freq bursts of SVT  mostly regular. Orthostatic hypotension now due to decrease in Hgb.  Monitor. HTN continue dilt, BB and losartan as BP and HR allow HLD continue zetia and lipitor GI bleed with UC  GI to eval today.  He has been T&C transfuse as needed Bladder lesions per urology.  Hx Asthma and paralyzed Left hemidiaphragm post op from CABG  - Pulmonary to provide clearance as well.     Risk Assessment/Risk Scores:          CHA2DS2-VASc Score = 4   This indicates a 4.8% annual risk of stroke. The patient's score is based upon: CHF History: 0 HTN History: 1 Diabetes History: 0 Stroke History: 0 Vascular Disease History: 1 Age Score: 2 Gender Score: 0         For questions or updates, please contact [facility] Please consult [email or web address] for contact info under    Signed, Nada Boozer, NP  06/04/2023 8:01 AM

## 2023-06-04 NOTE — Consult Note (Signed)
[facility] Consultation Note  Referring Provider: [facility] Primary Care Physician:  Georgann Housekeeper, MD Primary Gastroenterologist:  Dr.Brahmbhatt  Reason for Consultation:  bloody diarrhea  HPI: Barry [name] is a 77 y.o. male presenting weakness, fatigue, bloody diarrhea.  No abdominal pain.  CT bladder lesions but no obvious colitis.  No hematemesis or melena.  On eliquis chronically, which he stopped about a week ago.  Last colonoscopy 2021 showed diverticulosis, polyps benign and no active colitis.   Past Medical History:  Diagnosis Date   A-fib [facility])    Acute sinusitis 03/24/2012   Followed in Pulmonary clinic/ [facility]/ [name] - CT sinus 08/07/2013  Short air-fluid levels in the maxillary sinuses bilaterally suggesting early acute sinusitis. - repeat ct sinus 04/30/2014 > ok     Acute sinusitis, unspecified 08/14/2010   Qualifier: Diagnosis of  By: Clent Ridges NP, [name]     Asthma    PFTs 06/15/05 FEV1 85% predicted ratio 68% and truncation of resp loop in a sawtooth pattern. HFA 25% 08-01-2009 >50% Nov 01, 2009>50% 01-31-2010   Asthma, chronic 11/24/2007   Followed in Pulmonary clinic/ [facility]/ [name]  - PFTs 06/15/2005 FEV1 85% predicted ratio 68% and truncation of respiratory loop in a sawtooth pattern  - HFA 25% August 01, 2009 > 50% November 01, 2009 > 50% January 31, 2010 therefore changed to neb bud/brovana - HFA 75% p extensive coaching 08/07/2013  > insurance issues so try symbicort 160 2 bid instead of neb - 11/16/2015   p extensive   BRONCHITIS, ACUTE 01/22/2011   Qualifier: Diagnosis of  By: Clent Ridges NP, [name]     CAD (coronary artery disease), native coronary artery 02/06/2019   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   Cataract    Chronic cough    Chronic rhinitis 05/02/2009   Followed in Pulmonary clinic/ [facility]/ [name]    - 03/24/12 CT Sinus > Negative paranasal sinuses - Sinus CT 08/07/2013 >>Short air-fluid levels in the  maxillary sinuses bilaterally suggesting early acute sinusitis - Repeat augmentin x 21 days 03/27/2014 then sinus ct if not better  - flonase/afrin regimen 02/11/15 >>>      Coronary heart disease    COUGH, CHRONIC 11/24/2007   Followed in Pulmonary clinic/ [facility]/ [name]      DDD (degenerative disc disease), lumbar    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   Followed in Pulmonary clinic/ [facility]/ [name]  -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     Excessive ear wax, bilateral 09/15/2018   GASTROESOPHAGEAL REFLUX DISEASE 08/27/2008   Followed as Primary Care Patient/ GI/ Dr  Danise Edge     Glaucoma    Hyperlipidemia LDL goal <70 02/06/2019   Hypertension    Obesity    Osteoarthritis    Paralyzed hemidiaphragm    left, 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   Retina disorder 2014   Severe obesity (BMI >= 40) (HCC) 11/24/2007       - Target wt < 191 to get under BMI 30    Ulcerative colitis    ULCERATIVE COLITIS 11/24/2007   Followed as Primary Care Patient/ GI/ Dr  Danise Edge     URI 04/09/2008   Qualifier: Diagnosis of  By: Clent Ridges NP, [name]      Past Surgical History:  Procedure Laterality Date   CARDIAC CATHETERIZATION Left    COLONOSCOPY WITH PROPOFOL N/A 11/29/2015   Procedure: COLONOSCOPY WITH PROPOFOL;  Surgeon: Charolett Bumpers, MD;  Location: [facility];  Service:  Endoscopy;  Laterality: N/A;   CORONARY ARTERY BYPASS GRAFT     x6    Prior to Admission medications   Medication Sig Start Date End Date Taking? Authorizing Provider  acetaminophen (TYLENOL) 500 MG tablet Take per bottle as needed for arthritis   Yes [provider]  apixaban (ELIQUIS) 5 MG TABS tablet Take 1 tablet (5 mg total) by mouth 2 (two) times daily. 12/07/22  Yes Wendall Stade, MD  atorvastatin (LIPITOR) 40 MG tablet TAKE 1 TABLET BY MOUTH EVERY DAY 03/04/23  Yes Wendall Stade, MD  budesonide-formoterol (SYMBICORT) 160-4.5 MCG/ACT inhaler TAKE 2 PUFFS BY MOUTH TWICE A DAY 09/18/21  Yes  Nyoka Cowden, MD  carboxymethylcellulose (REFRESH PLUS) 0.5 % SOLN Use as needed for dry eye   Yes [provider]  Cholecalciferol (VITAMIN D) 50 MCG (2000 UT) tablet Take 2,000 Units by mouth daily.    Yes [provider]  cycloSPORINE (RESTASIS) 0.05 % ophthalmic emulsion Place 1 drop into both eyes 2 (two) times daily.   Yes [provider]  diltiazem (CARDIZEM CD) 240 MG 24 hr capsule TAKE 1 CAPSULE BY MOUTH EVERY DAY 05/14/23  Yes Wendall Stade, MD  escitalopram (LEXAPRO) 20 MG tablet Take 20 mg by mouth daily.   Yes [provider]  Eyelid Cleansers (OCUSOFT EYELID CLEANSING) PADS Apply topically as needed. Use daily   Yes [provider]  ezetimibe (ZETIA) 10 MG tablet TAKE 1 TABLET BY MOUTH EVERY DAY 03/04/23  Yes Wendall Stade, MD  Ferrous Sulfate (SLOW FE) 142 (45 Fe) MG TBCR 1 tablet Orally Once a day   Yes [provider]  latanoprost (XALATAN) 0.005 % ophthalmic solution Place 1 drop into both eyes at bedtime.   Yes [provider]  losartan (COZAAR) 25 MG tablet TAKE 1 TABLET BY MOUTH EVERY DAY   Yes [provider]  metoprolol tartrate (LOPRESSOR) 50 MG tablet TAKE 1 TABLET BY MOUTH TWICE A DAY 01/09/23  Yes Wendall Stade, MD  montelukast (SINGULAIR) 10 MG tablet TAKE ONE TABLET BY MOUTH AT BEDTIME EVERY NIGHT 02/18/23  Yes Nyoka Cowden, MD  Multiple Vitamins-Minerals (CENTRUM SILVER PO) Take 1 tablet by mouth daily.   Yes [provider]  sulfaSALAzine (AZULFIDINE) 500 MG tablet Take 1,000 mg by mouth 4 (four) times daily.   Yes [provider]  VENTOLIN HFA 108 (90 Base) MCG/ACT inhaler INHALE 2 PUFFS INTO THE LUNGS EVERY 4 HRS AS NEEDED FOR WHEEZING OR SHORTNESS OF BREATH Patient taking differently: Inhale 2 puffs into the lungs every 4 (four) hours as needed for wheezing or shortness of breath. 03/19/19  Yes Parrett, [name] S, NP  nitroGLYCERIN (NITROSTAT) 0.4 MG SL tablet Place 1 tablet  (0.4 mg total) under the tongue every 5 (five) minutes as needed for chest pain. 01/12/21   Wendall Stade, MD    Current Facility-Administered Medications  Medication Dose Route Frequency Provider Last Rate Last Admin   acetaminophen (TYLENOL) tablet 650 mg  650 mg Oral Q6H PRN Bobette Mo, MD   650 mg at 06/04/23 4098   Or   acetaminophen (TYLENOL) suppository 650 mg  650 mg Rectal Q6H PRN Bobette Mo, MD       albuterol (PROVENTIL) (2.5 MG/3ML) 0.083% nebulizer solution 2.5 mg  2.5 mg Nebulization Q4H PRN Lynden Ang, RPH       atorvastatin (LIPITOR) tablet 40 mg  40 mg Oral Daily Bobette Mo, MD  40 mg at 06/04/23 1034   cefTRIAXone (ROCEPHIN) 1 g in sodium chloride 0.9 % 100 mL IVPB  1 g Intravenous Q24H Bobette Mo, MD 200 mL/hr at 06/04/23 1057 1 g at 06/04/23 1057   cycloSPORINE (RESTASIS) 0.05 % ophthalmic emulsion 1 drop  1 drop Both Eyes BID Bobette Mo, MD   1 drop at 06/04/23 1035   diltiazem (CARDIZEM CD) 24 hr capsule 240 mg  240 mg Oral Daily Bobette Mo, MD   240 mg at 06/04/23 1032   escitalopram (LEXAPRO) tablet 20 mg  20 mg Oral Daily Bobette Mo, MD   20 mg at 06/04/23 1036   ezetimibe (ZETIA) tablet 10 mg  10 mg Oral Daily Bobette Mo, MD   10 mg at 06/04/23 1036   latanoprost (XALATAN) 0.005 % ophthalmic solution 1 drop  1 drop Both Eyes QHS Bobette Mo, MD   1 drop at 06/03/23 2315   losartan (COZAAR) tablet 25 mg  25 mg Oral Daily Bobette Mo, MD   25 mg at 06/04/23 1034   metoprolol tartrate (LOPRESSOR) tablet 50 mg  50 mg Oral BID Bobette Mo, MD   50 mg at 06/04/23 1033   mometasone-formoterol (DULERA) 200-5 MCG/ACT inhaler 2 puff  2 puff Inhalation BID Bobette Mo, MD   2 puff at 06/04/23 0743   nitroGLYCERIN (NITROSTAT) SL tablet 0.4 mg  0.4 mg Sublingual Q5 min PRN Bobette Mo, MD       ondansetron [facility]) tablet 4 mg  4 mg Oral Q6H PRN Bobette Mo,  MD       Or   ondansetron [facility]) injection 4 mg  4 mg Intravenous Q6H PRN Bobette Mo, MD       pantoprazole (PROTONIX) EC tablet 40 mg  40 mg Oral Daily Bobette Mo, MD   40 mg at 06/04/23 1035   polyvinyl alcohol (LIQUIFILM TEARS) 1.4 % ophthalmic solution 1 drop  1 drop Both Eyes TID PRN Lynden Ang, RPH       sulfaSALAzine (AZULFIDINE) tablet 1,000 mg  1,000 mg Oral QID Bobette Mo, MD   1,000 mg at 06/04/23 1036    Allergies as of 06/03/2023 - Review Complete 06/03/2023  Allergen Reaction Noted   Piroxicam Itching and Rash     Family History  Problem Relation Age of Onset   Heart disease Father    Hypertension Mother    Renal Disease Mother    Pulmonary Hypertension Mother    Other Sister        UNKNOWN HISTORY    Social History   Socioeconomic History   Marital status: Single    Spouse name: Not on file   Number of children: 0   Years of education: Not on file   Highest education level: Not on file  Occupational History   Occupation: disabled, prev worked in Designer, fashion/clothing  Tobacco Use   Smoking status: Never   Smokeless tobacco: Never  Substance and Sexual Activity   Alcohol use: No   Drug use: No   Sexual activity: Not on file  Other Topics Concern   Not on file  Social History Narrative   ** Merged History Encounter **       Social Determinants of Health   Financial Resource Strain: Not on file  Food Insecurity: No Food Insecurity (06/03/2023)   Hunger Vital Sign    Worried About Running Out of Food in the Last Year:  Never true    Ran Out of Food in the Last Year: Never true  Transportation Needs: No Transportation Needs (06/03/2023)   PRAPARE - Administrator, Civil Service (Medical): No    Lack of Transportation (Non-Medical): No  Physical Activity: Not on file  Stress: Not on file  Social Connections: Not on file  Intimate Partner Violence: Not At Risk (06/03/2023)   Humiliation, Afraid, Rape, and Kick  questionnaire    Fear of Current or Ex-Partner: No    Emotionally Abused: No    Physically Abused: No    Sexually Abused: No    Review of Systems: As per HPI all others negative  Physical Exam: Vital signs in last 24 hours: Temp:  [97.9 F (36.6 C)-98.9 F (37.2 C)] 98.9 F (37.2 C) (06/18 1000) Pulse Rate:  [66-110] 109 (06/18 1000) Resp:  [16-24] 20 (06/18 1000) BP: (107-154)/(72-99) 134/91 (06/18 1000) SpO2:  [93 %-96 %] 94 % (06/18 1000) Weight:  [95.5 kg] 95.5 kg (06/17 1752) Last BM Date : 06/03/23 General:   Alert,  overweight, and cooperative in NAD Head:  Normocephalic and atraumatic. Eyes:  Sclera clear, no icterus.   Conjunctiva pink. Ears:  Normal auditory acuity. Nose:  No deformity, discharge,  or lesions. Mouth:  No deformity or lesions.  Oropharynx pale and dry Neck:  Supple; no masses or thyromegaly. Lungs:  No respiratory distress Abdomen:  Soft, nontender and nondistended. No masses, hepatosplenomegaly or hernias noted. Normal bowel sounds, without guarding, and without rebound.     Msk:  Symmetrical without gross deformities. Normal posture. Pulses:  Normal pulses noted. Extremities:  Without clubbing or edema. Neurologic:  Alert and  oriented x4;  grossly normal neurologically. Skin:  Intact without significant lesions or rashes. Psych:  Alert and cooperative. Normal mood and affect.   Lab Results: Recent Labs    06/03/23 0920 06/03/23 1745 06/04/23 0453  WBC 5.0  --  5.7  HGB 9.5* 9.9* 10.1*  HCT 32.5* 33.0* 33.8*  PLT 239  --  228   BMET Recent Labs    06/03/23 0920 06/04/23 0453  NA 141 140  K 3.8 4.4  CL 109 108  CO2 22 22  GLUCOSE 98 101*  BUN 30* 24*  CREATININE 0.81 0.85  CALCIUM 8.8* 8.9   LFT Recent Labs    06/04/23 0453  PROT 6.0*  ALBUMIN 3.1*  AST 27  ALT 11  ALKPHOS 56  BILITOT 1.1   PT/INR Recent Labs    06/03/23 0935  LABPROT 13.7  INR 1.0    Studies/Results: CT ABDOMEN PELVIS W CONTRAST  Result  Date: 06/03/2023 CLINICAL DATA:  Pain history of ulcerative colitis. EXAM: CT ABDOMEN AND PELVIS WITH CONTRAST TECHNIQUE: Multidetector CT imaging of the abdomen and pelvis was performed using the standard protocol following bolus administration of intravenous contrast. RADIATION DOSE REDUCTION: This exam was performed according to the departmental dose-optimization program which includes automated exposure control, adjustment of the mA and/or kV according to patient size and/or use of iterative reconstruction technique. CONTRAST:  OMNIPAQUE IOHEXOL 300 MG/ML  SOLN COMPARISON:  None Available. FINDINGS: Lower chest: Breathing motion at the lung bases. Patient is status post median sternotomy. Coronary artery calcifications are seen. Elevated left hemidiaphragm. Calcified nodule left lower lobe series 5, image 28 consistent with old granulomatous disease. Hepatobiliary: No space-occupying liver lesion. Patent portal vein. Gallbladder is nondilated. Pancreas: Unremarkable. No pancreatic ductal dilatation or surrounding inflammatory changes. Spleen: Normal in size without focal  abnormality. Adrenals/Urinary Tract: Slight nonspecific nodularity of the adrenal glands. The left kidney is absent. The remnant distal left ureter is mildly dilated. Nonobstructing right-sided renal stone along the renal pelvis measuring 8 mm on series 3, image 40. No separate enhancing right-sided renal mass or right-sided renal collecting system dilatation. The bladder however has multiple mass lesions along the lumen diffusely distributed. Please correlate for any known history including for potential bladder cancer. Area of dystrophic calcification as well along the posterior aspect of the the bladder on the right side near the margin of the UVJ. Stomach/Bowel: No oral contrast. Stomach is nondilated. Small bowel is nondilated. There is a large left inguinal hernia involving a significant portion of the sigmoid colon. This a wide  mouth hernia. No signs of obstruction. Few areas of colonic diverticula. No specific colonic wall thickening, inflammatory stranding. The appendix is poorly seen in the right lower quadrant but no pericecal stranding or fluid. Vascular/Lymphatic: Aortic atherosclerosis. No enlarged abdominal or pelvic lymph nodes. Reproductive: Prostate is unremarkable. Other: Small fat containing umbilical hernia Musculoskeletal: Curvature of the spine. Scattered degenerative changes of the spine and pelvis. IMPRESSION: Multiple mass lesions along the bladder lumen with some areas of dystrophic calcification near the margin of the right UVJ. Please correlate for any known history. Neoplasm is possible recommend further workup when appropriate otherwise. Absence of the left kidney. Nonobstructing right-sided renal stone. No bowel obstruction, free air or free fluid. Large left inguinal hernia involving sigmoid colon. Electronically Signed   By: Karen Kays M.D.   On: 06/03/2023 11:13    Impression:   Bloody diarrhea.  No colitis seen on CT. Chronic anticoagulation, on hold. Bladder lesions worrisome for malignancy. History of ulcerative proctosigmoiditis.  Plan:   Stool studies (GI pathogen panel fecal calprotectin, hospital policy advises no C. Diff testing in setting of GI bleeding). Hold off on steroids pending stool studies. Urology evaluation underway. Anticoagulation on hold. [facility] will follow.   LOS: 1 day   Kynsli Haapala M  06/04/2023, 12:08 PM  Cell [phone] If no answer or after 5 PM call [phone]

## 2023-06-04 NOTE — Progress Notes (Signed)
Orthopedic Tech Progress Note Patient Details:  Barry [name] 10-09-1946 [id number]  Ortho Devices Type of Ortho Device: Knee Immobilizer Ortho Device/Splint Location: left Ortho Device/Splint Interventions: Ordered, Application, Adjustment   Post Interventions Patient Tolerated: Well Instructions Provided: Adjustment of device, Care of device  [name] 06/04/2023, 4:50 PM

## 2023-06-04 NOTE — Progress Notes (Signed)
Subjective: 6/18: First time meeting patient.  He was in a good mood on rounds.  We reviewed the plan of care and questions were answered to his satisfaction.  Objective: Vital signs in last 24 hours: Temp:  [97.9 F (36.6 C)-98.6 F (37 C)] 98 F (36.7 C) (06/18 0527) Pulse Rate:  [66-110] 84 (06/18 0527) Resp:  [16-24] 20 (06/18 0527) BP: (107-154)/(72-99) 137/99 (06/18 0527) SpO2:  [93 %-96 %] 94 % (06/18 0744) Weight:  [95.5 kg] 95.5 kg (06/17 1752)  Intake/Output from previous day: 06/17 0701 - 06/18 0700 In: 1300 [P.O.:200; IV Piggyback:1100] Out: 2750 [Urine:2750]  Intake/Output this shift: No intake/output data recorded.  Physical Exam:  General: Alert and oriented CV: No cyanosis Lungs: equal chest rise Abdomen: Soft, NTND, no rebound or guarding   Lab Results: Recent Labs    06/03/23 0920 06/03/23 1745 06/04/23 0453  HGB 9.5* 9.9* 10.1*  HCT 32.5* 33.0* 33.8*   BMET Recent Labs    06/03/23 0920 06/04/23 0453  NA 141 140  K 3.8 4.4  CL 109 108  CO2 22 22  GLUCOSE 98 101*  BUN 30* 24*  CREATININE 0.81 0.85  CALCIUM 8.8* 8.9     Studies/Results: CT ABDOMEN PELVIS W CONTRAST  Result Date: 06/03/2023 CLINICAL DATA:  Pain history of ulcerative colitis. EXAM: CT ABDOMEN AND PELVIS WITH CONTRAST TECHNIQUE: Multidetector CT imaging of the abdomen and pelvis was performed using the standard protocol following bolus administration of intravenous contrast. RADIATION DOSE REDUCTION: This exam was performed according to the departmental dose-optimization program which includes automated exposure control, adjustment of the mA and/or kV according to patient size and/or use of iterative reconstruction technique. CONTRAST:  OMNIPAQUE IOHEXOL 300 MG/ML  SOLN COMPARISON:  None Available. FINDINGS: Lower chest: Breathing motion at the lung bases. Patient is status post median sternotomy. Coronary artery calcifications are seen. Elevated left hemidiaphragm.  Calcified nodule left lower lobe series 5, image 28 consistent with old granulomatous disease. Hepatobiliary: No space-occupying liver lesion. Patent portal vein. Gallbladder is nondilated. Pancreas: Unremarkable. No pancreatic ductal dilatation or surrounding inflammatory changes. Spleen: Normal in size without focal abnormality. Adrenals/Urinary Tract: Slight nonspecific nodularity of the adrenal glands. The left kidney is absent. The remnant distal left ureter is mildly dilated. Nonobstructing right-sided renal stone along the renal pelvis measuring 8 mm on series 3, image 40. No separate enhancing right-sided renal mass or right-sided renal collecting system dilatation. The bladder however has multiple mass lesions along the lumen diffusely distributed. Please correlate for any known history including for potential bladder cancer. Area of dystrophic calcification as well along the posterior aspect of the the bladder on the right side near the margin of the UVJ. Stomach/Bowel: No oral contrast. Stomach is nondilated. Small bowel is nondilated. There is a large left inguinal hernia involving a significant portion of the sigmoid colon. This a wide mouth hernia. No signs of obstruction. Few areas of colonic diverticula. No specific colonic wall thickening, inflammatory stranding. The appendix is poorly seen in the right lower quadrant but no pericecal stranding or fluid. Vascular/Lymphatic: Aortic atherosclerosis. No enlarged abdominal or pelvic lymph nodes. Reproductive: Prostate is unremarkable. Other: Small fat containing umbilical hernia Musculoskeletal: Curvature of the spine. Scattered degenerative changes of the spine and pelvis. IMPRESSION: Multiple mass lesions along the bladder lumen with some areas of dystrophic calcification near the margin of the right UVJ. Please correlate for any known history. Neoplasm is possible recommend further workup when appropriate otherwise. Absence of the left  kidney.  Nonobstructing right-sided renal stone. No bowel obstruction, free air or free fluid. Large left inguinal hernia involving sigmoid colon. Electronically Signed   By: Karen Kays M.D.   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 at midnight To the OR for TURBT with Dr. Annabell Howells tomorrow Urine cytology pending   LOS: 1 day   Elmon Kirschner, NP [facility] Pager: [phone]  06/04/2023, 9:25 AM

## 2023-06-04 NOTE — Progress Notes (Signed)
PROGRESS NOTE    Barry [name]  ZOX:[id number] DOB: August 17, 1946 DOA: 06/03/2023 PCP: Georgann Housekeeper, MD   Brief Narrative:  Per Dr. Robb Matar 06/03/2023-patient admitted from home he lives alone.  He reports having a fall prior to admission and complains of left knee pain.  His left knee is swollen. Barry [name] is a 77 y.o. male with medical history significant of paroxysmal atrial fibrillation, history of acute sinusitis, asthma, history of acute bronchitis, CAD, CABG, cataracts, chronic cough, chronic rhinitis, GERD, glaucoma, hyperlipidemia, hypertension, class III obesity, osteoarthritis, left side paralyzed hemidiaphragm, retinal disorder, ulcerative colitis was coming to the emergency department with complaints of postural dizziness and 4 episodes of diarrhea yesterday with intermittent bright red blood per rectum.  The patient stated that in the morning he went to the bathroom and felt very lightheaded when he stood up.  He lowered himself to the floor to avoid injuries.  He denied LOC.  He has been having hematochezia since last week and thinks he has an ulcerative colitis flare.  He saw gastroenterology on Thursday, was prescribed prednisone, but still has not taken it.  He has also noticed episodes of hematuria in the last few days.  He has had low grade subjective fever, chills, fatigue and decreased appetite. He denied rhinorrhea, sore throat or hemoptysis.  He has had occasional wheezing.   No flank pain, but positive mild dysuria, frequency and hematuria. No emesis, diarrhea, constipation or melena.  No chest pain, palpitations, diaphoresis, PND, orthopnea or pitting edema of the lower extremities.  No polyuria, polydipsia, polyphagia or blurred vision.  ED course: Initial vital signs were temperature 98.2 F, pulse 90, respiration 14, BP 142/73 mmHg O2 sat 96% on room air.  The patient received 1000 mL of LR bolus and ceftriaxone 1 g IVPB.  GI and urology were consulted  Assessment & Plan:    Principal Problem:   Complicated UTI (urinary tract infection) Active Problems:   Severe obesity (BMI >= 40) (HCC)   Asthma, chronic   GASTROESOPHAGEAL REFLUX DISEASE   ULCERATIVE COLITIS   CAD (coronary artery disease), native coronary artery   Benign essential HTN   Hyperlipidemia LDL goal <70   PAF (paroxysmal atrial fibrillation) (HCC)   Bladder mass   Pressure injury of skin   Lower GI bleed   Preoperative cardiovascular examination   Hx of CABG  #1 bladder mass-CT abdomen shows multiple mass lesions along the bladder lumen with some areas of dystrophic calcification near the margin of the right UVJ.  Absence of left kidney.  Nonobstructing right-sided renal stone.  No bowel obstruction free air or free fluid.  Large left inguinal hernia involving the sigmoid colon. Patient was seen by urology plan is to go to the OR on 06/05/2023.  N.p.o. after midnight. UA with more than 50 WBCs negative leukocytes and nitrites follow-up urine culture he is on Rocephin. Cardiology consulted for clearance following.  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  #3 CAD continue atorvastatin beta-blocker ezetimibe  holding apixaban.  #4 history of essential hypertension on diltiazem metoprolol and losartan  #5 hyperlipidemia continue statin  #6 paroxysmal atrial fibrillation continue beta-blocker and Cardizem.  Eliquis on hold.  #7 left knee swelling status post fall he thinks he probably fell on his left knee but the knee is swollen much more compared to the right knee. Will obtain x-ray of his  left knee.  #8 stage II pressure injury to the bilateral buttocks with partial-thickness loss of the dermis present on admission-  #9 depression on Lexapro  #10 glaucoma continue eyedrops  #11 GERD on Protonix  #12 history of  asthma/paralyzed left hemidiaphragm status post CABG-incentive spirometer nebulizer   Pressure Injury 06/03/23 Buttocks Bilateral Stage 2 -  Partial thickness loss of dermis presenting as a shallow open injury with a red, pink wound bed without slough. (Active)  06/03/23 1740  Location: Buttocks  Location Orientation: Bilateral  Staging: Stage 2 -  Partial thickness loss of dermis presenting as a shallow open injury with a red, pink wound bed without slough.  Wound Description (Comments):   Present on Admission: Yes  Dressing Type Foam - Lift dressing to assess site every shift 06/04/23 0730    Estimated body mass index is 37.3 kg/m as calculated from the following:   Height as of this encounter: 5\' 3"  (1.6 m).   Weight as of this encounter: 95.5 kg.  DVT prophylaxis: None due to bloody diarrhea and 4 OR tomorrow for Code Status: Full code Family Communication: None at bedside he does not have any family Disposition Plan:  Status is: Inpatient Remains inpatient appropriate because: Bloody diarrhea   Consultants:  GI, urology, cardiology  Procedures: None Antimicrobials: Rocephin  Subjective: Patient complaining of pain in the left knee He does not ambulate much but mostly in the house with a walker he is not sure when he fell that he feels dizzy or not he thinks he probably did  Objective: Vitals:   06/04/23 0159 06/04/23 0527 06/04/23 0744 06/04/23 1000  BP: 139/79 (!) 137/99  (!) 134/91  Pulse: 81 84  (!) 109  Resp: 20 20  20   Temp: 98.2 F (36.8 C) 98 F (36.7 C)  98.9 F (37.2 C)  TempSrc: Oral Oral  Oral  SpO2: 94% 93% 94% 94%  Weight:      Height:        Intake/Output Summary (Last 24 hours) at 06/04/2023 1111 Last data filed at 06/04/2023 0932 Gross per 24 hour  Intake 1300 ml  Output 3250 ml  Net -1950 ml   Filed Weights   06/03/23 1752  Weight: 95.5 kg    Examination:  General exam: Appears calm and comfortable  Respiratory system: Diminished to  auscultation. Respiratory effort normal. Cardiovascular system: S1 & S2 heard, RRR. No JVD, murmurs, rubs, gallops or clicks. No pedal edema. Gastrointestinal system: Abdomen is nondistended, soft and nontender. No organomegaly or masses felt. Normal bowel sounds heard. Central nervous system: Alert and oriented. No focal neurological deficits. Extremities: Swollen left knee Skin: No rashes, lesions or ulcers Psychiatry: Judgement and insight appear normal. Mood & affect appropriate.     Data Reviewed: I have personally reviewed following labs and imaging studies  CBC: Recent Labs  Lab 06/03/23 0920 06/03/23 1745 06/04/23 0453  WBC 5.0  --  5.7  NEUTROABS 3.4  --   --   HGB 9.5* 9.9* 10.1*  HCT 32.5* 33.0* 33.8*  MCV 96.4  --  95.5  PLT 239  --  228   Basic Metabolic Panel: Recent Labs  Lab 06/03/23 0920 06/04/23 0453  NA 141 140  K 3.8 4.4  CL 109 108  CO2 22 22  GLUCOSE 98 101*  BUN 30* 24*  CREATININE 0.81 0.85  CALCIUM 8.8* 8.9  MG 2.0  --    GFR: Estimated Creatinine Clearance: 72.1 mL/min (by C-G formula based  on SCr of 0.85 mg/dL). Liver Function Tests: Recent Labs  Lab 06/03/23 0920 06/04/23 0453  AST 18 27  ALT 16 11  ALKPHOS 59 56  BILITOT 0.3 1.1  PROT 6.4* 6.0*  ALBUMIN 3.3* 3.1*   No results for input(s): "LIPASE", "AMYLASE" in the last 168 hours. No results for input(s): "AMMONIA" in the last 168 hours. Coagulation Profile: Recent Labs  Lab 06/03/23 0935  INR 1.0   Cardiac Enzymes: No results for input(s): "CKTOTAL", "CKMB", "CKMBINDEX", "TROPONINI" in the last 168 hours. BNP (last 3 results) No results for input(s): "PROBNP" in the last 8760 hours. HbA1C: No results for input(s): "HGBA1C" in the last 72 hours. CBG: Recent Labs  Lab 06/03/23 0918  GLUCAP 94   Lipid Profile: No results for input(s): "CHOL", "HDL", "LDLCALC", "TRIG", "CHOLHDL", "LDLDIRECT" in the last 72 hours. Thyroid Function Tests: No results for input(s):  "TSH", "T4TOTAL", "FREET4", "T3FREE", "THYROIDAB" in the last 72 hours. Anemia Panel: No results for input(s): "VITAMINB12", "FOLATE", "FERRITIN", "TIBC", "IRON", "RETICCTPCT" in the last 72 hours. Sepsis Labs: No results for input(s): "PROCALCITON", "LATICACIDVEN" in the last 168 hours.  Recent Results (from the past 240 hour(s))  Urine Culture     Status: Abnormal   Collection Time: 06/03/23  9:12 AM   Specimen: Urine, Clean Catch  Result Value Ref Range Status   Specimen Description   Final    URINE, CLEAN CATCH Performed at [facility], [address], [place], [place] [address]    Special Requests   Final    NONE Performed at [facility], [address] [place], [place] [address]    Culture (A)  Final    <10,000 COLONIES/mL INSIGNIFICANT GROWTH Performed at [facility], [address] [place], [place] [address]    Report Status 06/04/2023 FINAL  Final         Radiology Studies: CT ABDOMEN PELVIS W CONTRAST  Result Date: 06/03/2023 CLINICAL DATA:  Pain history of ulcerative colitis. EXAM: CT ABDOMEN AND PELVIS WITH CONTRAST TECHNIQUE: Multidetector CT imaging of the abdomen and pelvis was performed using the standard protocol following bolus administration of intravenous contrast. RADIATION DOSE REDUCTION: This exam was performed according to the departmental dose-optimization program which includes automated exposure control, adjustment of the mA and/or kV according to patient size and/or use of iterative reconstruction technique. CONTRAST:  OMNIPAQUE IOHEXOL 300 MG/ML  SOLN COMPARISON:  None Available. FINDINGS: Lower chest: Breathing motion at the lung bases. Patient is status post median sternotomy. Coronary artery calcifications are seen. Elevated left hemidiaphragm. Calcified nodule left lower lobe series 5, image 28 consistent with old granulomatous disease. Hepatobiliary: No space-occupying liver lesion. Patent portal  vein. Gallbladder is nondilated. Pancreas: Unremarkable. No pancreatic ductal dilatation or surrounding inflammatory changes. Spleen: Normal in size without focal abnormality. Adrenals/Urinary Tract: Slight nonspecific nodularity of the adrenal glands. The left kidney is absent. The remnant distal left ureter is mildly dilated. Nonobstructing right-sided renal stone along the renal pelvis measuring 8 mm on series 3, image 40. No separate enhancing right-sided renal mass or right-sided renal collecting system dilatation. The bladder however has multiple mass lesions along the lumen diffusely distributed. Please correlate for any known history including for potential bladder cancer. Area of dystrophic calcification as well along the posterior aspect of the the bladder on the right side near the margin of the UVJ. Stomach/Bowel: No oral contrast. Stomach is nondilated. Small bowel is nondilated. There is a large left inguinal hernia  involving a significant portion of the sigmoid colon. This a wide mouth hernia. No signs of obstruction. Few areas of colonic diverticula. No specific colonic wall thickening, inflammatory stranding. The appendix is poorly seen in the right lower quadrant but no pericecal stranding or fluid. Vascular/Lymphatic: Aortic atherosclerosis. No enlarged abdominal or pelvic lymph nodes. Reproductive: Prostate is unremarkable. Other: Small fat containing umbilical hernia Musculoskeletal: Curvature of the spine. Scattered degenerative changes of the spine and pelvis. IMPRESSION: Multiple mass lesions along the bladder lumen with some areas of dystrophic calcification near the margin of the right UVJ. Please correlate for any known history. Neoplasm is possible recommend further workup when appropriate otherwise. Absence of the left kidney. Nonobstructing right-sided renal stone. No bowel obstruction, free air or free fluid. Large left inguinal hernia involving sigmoid colon. Electronically Signed    By: Karen Kays M.D.   On: 06/03/2023 11:13        Scheduled Meds:  atorvastatin  40 mg Oral Daily   cycloSPORINE  1 drop Both Eyes BID   diltiazem  240 mg Oral Daily   escitalopram  20 mg Oral Daily   ezetimibe  10 mg Oral Daily   latanoprost  1 drop Both Eyes QHS   losartan  25 mg Oral Daily   metoprolol tartrate  50 mg Oral BID   mometasone-formoterol  2 puff Inhalation BID   pantoprazole  40 mg Oral Daily   sulfaSALAzine  1,000 mg Oral QID   Continuous Infusions:  cefTRIAXone (ROCEPHIN)  IV 1 g (06/04/23 1057)     LOS: 1 day   Time spent: 39 min  Alwyn Ren, MD 06/04/2023, 11:11 AM

## 2023-06-05 ENCOUNTER — Telehealth: Payer: Self-pay | Admitting: *Deleted

## 2023-06-05 ENCOUNTER — Other Ambulatory Visit: Payer: Self-pay | Admitting: Urology

## 2023-06-05 DIAGNOSIS — Z951 Presence of aortocoronary bypass graft: Secondary | ICD-10-CM | POA: Diagnosis not present

## 2023-06-05 DIAGNOSIS — N3289 Other specified disorders of bladder: Secondary | ICD-10-CM | POA: Diagnosis not present

## 2023-06-05 DIAGNOSIS — I480 Paroxysmal atrial fibrillation: Secondary | ICD-10-CM | POA: Diagnosis not present

## 2023-06-05 DIAGNOSIS — R319 Hematuria, unspecified: Secondary | ICD-10-CM | POA: Diagnosis not present

## 2023-06-05 DIAGNOSIS — M25462 Effusion, left knee: Secondary | ICD-10-CM

## 2023-06-05 DIAGNOSIS — Z01810 Encounter for preprocedural cardiovascular examination: Secondary | ICD-10-CM | POA: Diagnosis not present

## 2023-06-05 DIAGNOSIS — N390 Urinary tract infection, site not specified: Secondary | ICD-10-CM | POA: Diagnosis not present

## 2023-06-05 DIAGNOSIS — R5381 Other malaise: Secondary | ICD-10-CM

## 2023-06-05 LAB — COMPREHENSIVE METABOLIC PANEL
ALT: 15 U/L (ref 0–44)
AST: 19 U/L (ref 15–41)
Albumin: 3.2 g/dL — ABNORMAL LOW (ref 3.5–5.0)
Alkaline Phosphatase: 63 U/L (ref 38–126)
Anion gap: 8 (ref 5–15)
BUN: 26 mg/dL — ABNORMAL HIGH (ref 8–23)
CO2: 25 mmol/L (ref 22–32)
Calcium: 8.9 mg/dL (ref 8.9–10.3)
Chloride: 103 mmol/L (ref 98–111)
Creatinine, Ser: 0.84 mg/dL (ref 0.61–1.24)
GFR, Estimated: >60 mL/min (ref >60)
Glucose, Bld: 132 mg/dL — ABNORMAL HIGH (ref 70–99)
Potassium: 4.1 mmol/L (ref 3.5–5.1)
Sodium: 136 mmol/L (ref 135–145)
Total Bilirubin: 0.6 mg/dL (ref 0.3–1.2)
Total Protein: 6 g/dL — ABNORMAL LOW (ref 6.5–8.1)

## 2023-06-05 LAB — CBC
HCT: 33.3 % — ABNORMAL LOW (ref 39.0–52.0)
Hemoglobin: 10.2 g/dL — ABNORMAL LOW (ref 13.0–17.0)
MCH: 29 pg (ref 26.0–34.0)
MCHC: 30.6 g/dL (ref 30.0–36.0)
MCV: 94.6 fL (ref 80.0–100.0)
Platelets: 236 10*3/uL (ref 150–400)
RBC: 3.52 MIL/uL — ABNORMAL LOW (ref 4.22–5.81)
RDW: 17.2 % — ABNORMAL HIGH (ref 11.5–15.5)
WBC: 7.7 10*3/uL (ref 4.0–10.5)
nRBC: 0 % (ref 0.0–0.2)

## 2023-06-05 LAB — GASTROINTESTINAL PANEL BY PCR, STOOL (REPLACES STOOL CULTURE)

## 2023-06-05 LAB — CYTOLOGY - NON PAP

## 2023-06-05 MED ORDER — APIXABAN 5 MG PO TABS
5.0000 mg | ORAL_TABLET | Freq: Two times a day (BID) | ORAL | Status: DC
  Administered 2023-06-05 – 2023-06-10 (×11): 5 mg via ORAL
  Filled 2023-06-05 (×11): qty 1

## 2023-06-05 NOTE — Assessment & Plan Note (Addendum)
-  Risks and benefits discussed regarding Eliquis with patient and his friend, Consuella Lose, bedside.  We will reattempt initiation of Eliquis and monitoring for any worsening hematuria along with hemoglobin drop -So far continues to have no further significant hematuria.  Will continue trending hemoglobin however

## 2023-06-05 NOTE — Assessment & Plan Note (Addendum)
-   Continue Lopressor, cardizem, losartan

## 2023-06-05 NOTE — TOC Progression Note (Signed)
Transition of Care [facility]) - Progression Note    Patient Details  Name: SORA URGILES MRN: [id number] Date of Birth: Oct 21, 1946  Transition of Care [facility]) CM/SW Contact  Erin Sons, [place] Phone Number: 06/05/2023, 10:33 AM  Clinical Narrative:     Transition of Care [facility]) - Inpatient Brief Assessment   Patient Details  Name: EVERLY HAIZLIP MRN: [id number] Date of Birth: [birth year]  Transition of Care [facility]) CM/SW Contact:    Erin Sons, LCSW Phone Number: 06/05/2023, 10:34 AM   Clinical Narrative:  Brief Assessment completed. PT/OT evals pending; TOC will follow  Transition of Care Asessment: Insurance and Status: Insurance coverage has been reviewed Patient has primary care physician: Yes Home environment has been reviewed: Yes   Prior/Current Home Services: No current home services Social Determinants of Health Reivew: SDOH reviewed no interventions necessary Readmission risk has been reviewed: Yes Transition of care needs: transition of care needs identified, TOC will continue to follow       Expected Discharge Plan and Services                                               Social Determinants of Health (SDOH) Interventions SDOH Screenings   Food Insecurity: No Food Insecurity (06/03/2023)  Housing: Low Risk  (06/03/2023)  Transportation Needs: No Transportation Needs (06/03/2023)  Utilities: Not At Risk (06/03/2023)  Tobacco Use: Low Risk  (06/04/2023)    Readmission Risk Interventions     No data to display

## 2023-06-05 NOTE — Assessment & Plan Note (Signed)
-   Continue Eliquis, Lipitor, Lopressor

## 2023-06-05 NOTE — Assessment & Plan Note (Signed)
continue PPI

## 2023-06-05 NOTE — Telephone Encounter (Signed)
-----   Message from Nyoka Cowden, MD sent at 06/04/2023  1:17 PM EDT ----- Regarding: RE: surgical clearance Needs ov with me or whoever is his pulmonary doc for preop clearance ----- Message ----- From: Bjorn Pippin, MD Sent: 06/04/2023   1:02 PM EDT To: Nyoka Cowden, MD Subject: RE: surgical clearance                         He is currently admitted at [facility].  I probably won't be able to do him anytime soon, so whatever you need to do.  Thanks.  ----- Message ----- From: Nyoka Cowden, MD Sent: 06/04/2023   8:22 AM EDT To: Bjorn Pippin, MD Subject: RE: surgical clearance                         Probably, but I would need to see him first - he predominantly has asthma with component of restrictive chest wall physiology /obesity  ----- Message ----- From: Bjorn Pippin, MD Sent: 06/03/2023   9:08 PM EDT To: Wendall Stade, MD; Nyoka Cowden, MD Subject: surgical clearance                             This fellow is admitted to [facility] with a GI bleed with a UC flair.  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  He will need cystoscopy with a TURBT that will probably have to be staged with the number of lesion noted.  He also has a solitary kidney with an 8mm stone that will probably need to be treated at some point.     He is currently off of his Eliquis for the bleeding.    Would he be a candidate for general anesthesia.     Thanks.  Jonny Ruiz

## 2023-06-05 NOTE — Assessment & Plan Note (Signed)
-   Complicates overall prognosis and care - Body mass index is 37.3 kg/m.

## 2023-06-05 NOTE — Progress Notes (Deleted)
OT Cancellation Note  Patient Details Name: REMIGIO KARAMAN MRN: [id number] DOB: Jan 01, 1946   Cancelled Treatment:    Reason Eval/Treat Not Completed: Patient at procedure or test/ unavailable Patient pending TURBT today with Dr. Annabell Howells. OT to continue to follow and check back as schedule will allow.  Rosalio Loud, MS Acute Rehabilitation Department Office# [phone]   06/05/2023, 2:47 PM

## 2023-06-05 NOTE — Hospital Course (Signed)
[name] is a 77 year old male with PMH A-fib, asthma, CAD, GERD, glaucoma, HLD, HTN, obesity, osteoarthritis, ulcerative colitis who presented with left knee pain/swelling after a fall at home and also reports of some hematuria.  There was also questionable concern for hematochezia on admission but after further collateral information, it appears most of his symptoms were related to hematuria.  Initially GI had been consulted in setting of hemoglobin drop and the suspected hematochezia but GI bleed was ruled out after hematuria was the more prominent culprit.  He was not felt to have any exacerbation of his ulcerative colitis either. CT abdomen/pelvis was obtained which showed multiple bladder lesions.  Urology was also consulted and plan is for outpatient follow-up for possible TURBT.

## 2023-06-05 NOTE — Assessment & Plan Note (Addendum)
-   Continue Eliquis, Lipitor, Lopressor, losartan

## 2023-06-05 NOTE — Progress Notes (Signed)
Subjective: No blood in stool. No bowel movement today.  Objective: Vital signs in last 24 hours: Temp:  [99.1 F (37.3 C)-99.6 F (37.6 C)] 99.6 F (37.6 C) (06/19 0414) Pulse Rate:  [80-110] 110 (06/19 1023) Resp:  [20-24] 20 (06/19 0414) BP: (117-150)/(60-91) 138/91 (06/19 1023) SpO2:  [93 %-97 %] 97 % (06/19 0945) Weight change:  Last BM Date : 06/04/23  PE: GEN:  Overweight, debilitated, NAD ABD:  Soft, mild tenderness without peritonitis  Lab Results: CBC    Component Value Date/Time   WBC 7.7 06/05/2023 0525   RBC 3.52 (L) 06/05/2023 0525   HGB 10.2 (L) 06/05/2023 0525   HGB 11.6 (L) 02/12/2023 1114   HCT 33.3 (L) 06/05/2023 0525   HCT 35.9 (L) 02/12/2023 1114   PLT 236 06/05/2023 0525   PLT 245 02/12/2023 1114   MCV 94.6 06/05/2023 0525   MCV 93 02/12/2023 1114   MCH 29.0 06/05/2023 0525   MCHC 30.6 06/05/2023 0525   RDW 17.2 (H) 06/05/2023 0525   RDW 14.9 02/12/2023 1114   LYMPHSABS 0.9 06/03/2023 0920   MONOABS 0.5 06/03/2023 0920   EOSABS 0.1 06/03/2023 0920   BASOSABS 0.0 06/03/2023 0920   CMP     Component Value Date/Time   NA 136 06/05/2023 0525   NA 142 12/07/2022 0904   K 4.1 06/05/2023 0525   CL 103 06/05/2023 0525   CO2 25 06/05/2023 0525   GLUCOSE 132 (H) 06/05/2023 0525   BUN 26 (H) 06/05/2023 0525   BUN 28 (H) 12/07/2022 0904   CREATININE 0.84 06/05/2023 0525   CALCIUM 8.9 06/05/2023 0525   PROT 6.0 (L) 06/05/2023 0525   ALBUMIN 3.2 (L) 06/05/2023 0525   AST 19 06/05/2023 0525   ALT 15 06/05/2023 0525   ALKPHOS 63 06/05/2023 0525   BILITOT 0.6 06/05/2023 0525   EGFR 89 12/07/2022 0904   GFRNONAA >60 06/05/2023 0525     Assessment:  Bloody diarrhea.  No colitis seen on CT. Chronic anticoagulation, on hold. Bladder lesions worrisome for malignancy. History of ulcerative proctosigmoiditis.  Plan:   After talking to patient and his friend in more detail, I am not convinced patient was ever having bloody diarrhea ("I never  look at my bowel movements, but I can look at my urine").  Thus, I suspect most of this is actually not bloody diarrhea but, rather, blood tinged urine which, in light of CT findings, could also account for anemia. Benefiber one tbsp po bid could help bowel regulation. GI pathogen panel negative; no plans for further GI intervention. Patient's principal needs are coordination of care for his suspected bladder cancer. [facility] will sign-off; please call with questions; patient does not need directed GI follow-up for matters related to this hospitalization, but will need to continue his regular prior GI follow-up established pre-admission with Dr. Levora Angel.   Barry [name] 06/05/2023, 10:31 AM   Cell [phone] If no answer or after 5 PM call [phone]

## 2023-06-05 NOTE — Assessment & Plan Note (Addendum)
-   R/B discussed about stopping/resuming; for now he's okay with resuming and monitoring response of hematuria as well as watching Hgb  - Hgb remaining stable

## 2023-06-05 NOTE — Assessment & Plan Note (Signed)
Continue ezetimibe.  ?

## 2023-06-05 NOTE — Telephone Encounter (Signed)
I called pt and there was no answer- LMTCB.  

## 2023-06-05 NOTE — Assessment & Plan Note (Signed)
-   per CT A/P on 6/17: "Multiple mass lesions along the bladder lumen with some areas of dystrophic calcification near the margin of the right UVJ" -Has been evaluated by urology with tentative plans for outpatient follow-up on 06/28/2023 for TURBT

## 2023-06-05 NOTE — Assessment & Plan Note (Addendum)
-   UA noted with negative nitrite, negative LE, rare bacteria, greater than 50 WBC.  In setting of hematuria, he was started on antibiotics in case of underlying UTI as well -Urine culture noted with <10k colonies - may just complete empiric course but ultimately suspect the hematuria is related to the lesions with possible contribution from Eliquis  -Rocephin course completed during hospitalization

## 2023-06-05 NOTE — Assessment & Plan Note (Addendum)
-   no s/s flare/exac at this time; evaluated by GI -Continue sulfasalazine

## 2023-06-05 NOTE — Progress Notes (Signed)
Subjective:  Denies SSCP, palpitations or Dyspnea Caretaker Consuella Lose in room  They want to know when surgery will be   Objective:  Vitals:   06/04/23 2117 06/05/23 0414 06/05/23 0945 06/05/23 1023  BP: 125/60 (!) 150/64  (!) 138/91  Pulse: (!) 105 83  (!) 110  Resp: 20 20    Temp: 99.3 F (37.4 C) 99.6 F (37.6 C)    TempSrc: Oral Oral    SpO2: 93% 93% 97%   Weight:      Height:        Intake/Output from previous day:  Intake/Output Summary (Last 24 hours) at 06/05/2023 1044 Last data filed at 06/05/2023 0735 Gross per 24 hour  Intake 580 ml  Output 1650 ml  Net -1070 ml    Physical Exam: Frail elderly male Lungs clear Prior sternotomy no murmur Abdomen benign  No edema  Lab Results: Basic Metabolic Panel: Recent Labs    06/03/23 0920 06/04/23 0453 06/05/23 0525  NA 141 140 136  K 3.8 4.4 4.1  CL 109 108 103  CO2 22 22 25   GLUCOSE 98 101* 132*  BUN 30* 24* 26*  CREATININE 0.81 0.85 0.84  CALCIUM 8.8* 8.9 8.9  MG 2.0  --   --    Liver Function Tests: Recent Labs    06/04/23 0453 06/05/23 0525  AST 27 19  ALT 11 15  ALKPHOS 56 63  BILITOT 1.1 0.6  PROT 6.0* 6.0*  ALBUMIN 3.1* 3.2*   No results for input(s): "LIPASE", "AMYLASE" in the last 72 hours. CBC: Recent Labs    06/03/23 0920 06/03/23 1745 06/04/23 0453 06/05/23 0525  WBC 5.0  --  5.7 7.7  NEUTROABS 3.4  --   --   --   HGB 9.5*   < > 10.1* 10.2*  HCT 32.5*   < > 33.8* 33.3*  MCV 96.4  --  95.5 94.6  PLT 239  --  228 236   < > = values in this interval not displayed.     Imaging: DG Knee 3 Views Left  Result Date: 06/04/2023 CLINICAL DATA:  Left knee pain, no known injury, initial encounter EXAM: LEFT KNEE - 3 VIEW COMPARISON:  06/25/2022 FINDINGS: Extensive degenerative changes are noted worst in the lateral joint space moderate joint effusion is seen. The overall appearance is similar to that noted on the prior exam. Postsurgical changes along the medial calf are seen.  IMPRESSION: Degenerative changes stable from the prior exam. Electronically Signed   By: Alcide Clever M.D.   On: 06/04/2023 19:45    Cardiac Studies:  ECG: SR no acute changes    Telemetry:  SR/PAF  Echo: 1/24 EF 50-55%   Medications:    atorvastatin  40 mg Oral Daily   cycloSPORINE  1 drop Both Eyes BID   diltiazem  240 mg Oral Daily   escitalopram  20 mg Oral Daily   ezetimibe  10 mg Oral Daily   latanoprost  1 drop Both Eyes QHS   losartan  25 mg Oral Daily   metoprolol tartrate  50 mg Oral BID   mometasone-formoterol  2 puff Inhalation BID   pantoprazole  40 mg Oral Daily   sulfaSALAzine  1,000 mg Oral QID      cefTRIAXone (ROCEPHIN)  IV Stopped (06/04/23 1135)    Assessment/Plan:   Preoperative:  poor functional status but no angina low normal EF and low risk myovue 08/13/22 diaphragmatic attenuation EF 57% no ischemia Needs  bladder surgery 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

## 2023-06-05 NOTE — Progress Notes (Signed)
Progress Note    Barry [name]   ZOX:[id number]  DOB: 10/04/1946  DOA: 06/03/2023     2 PCP: Georgann Housekeeper, MD  Initial CC: fall at home, hematuria  Hospital Course: Barry [name] is a 77 year old male with PMH A-fib, asthma, CAD, GERD, glaucoma, HLD, HTN, obesity, osteoarthritis, ulcerative colitis who presented with left knee pain/swelling after a fall at home and also reports of some hematuria.  There was also questionable concern for hematochezia on admission but after further collateral information, it appears most of his symptoms were related to hematuria.  Initially GI had been consulted in setting of hemoglobin drop and the suspected hematochezia but GI bleed was ruled out after hematuria was the more prominent culprit.  He was not felt to have any exacerbation of his ulcerative colitis either. CT abdomen/pelvis was obtained which showed multiple bladder lesions.  Urology was also consulted and plan is for outpatient follow-up for possible TURBT.   Interval History:  No events overnight.  His friend is bedside this morning.  We discussed resuming his Eliquis and watching the hematuria and hemoglobin. He has outpatient follow-up with urology scheduled for the possible TURBT.  No further GI workup is planned. He might need rehab.   Assessment and Plan: * Complicated UTI (urinary tract infection) - UA noted with negative nitrite, negative LE, rare bacteria, greater than 50 WBC.  In setting of hematuria, he was started on antibiotics in case of underlying UTI as well -Continue Rocephin -Urine culture noted with <10k colonies - may just complete empiric course but ultimately suspect the hematuria is related to the lesions with possible contribution from Eliquis   Bladder mass - per CT A/P on 6/17: "Multiple mass lesions along the bladder lumen with some areas of dystrophic calcification near the margin of the right UVJ" -Has been evaluated by urology with tentative plans for outpatient  follow-up on 06/28/2023 for TURBT  Hematuria -Risks and benefits discussed regarding Eliquis with patient and his friend bedside.  We will reattempt initiation of Eliquis and monitoring for any worsening hematuria along with hemoglobin drop  PAF (paroxysmal atrial fibrillation) (HCC) - R/B discussed about stopping/resuming; for now he's okay with resuming and monitoring response of hematuria as well as watching Hgb   Physical deconditioning - his friend is concerned about his ability to return home independently - followup PT/OT evals   Effusion of left knee - s/p fall at home - xray shows degenerative changes and moderate joint effusion -No erythema mass, calor, exquisite tenderness; low suspicion for infection at this time - Continue knee immobilizer and supportive care  Hx of CABG - Continue Eliquis, Lipitor, losartan, Lopressor  Hyperlipidemia LDL goal <70 - Continue ezetimibe  Benign essential HTN - Continue Cardizem, losartan, Lopressor  CAD (coronary artery disease), native coronary artery - Continue Eliquis, Lipitor, Lopressor  ULCERATIVE COLITIS - no s/s flare/exac at this time; evaluated by GI -Continue sulfasalazine  GASTROESOPHAGEAL REFLUX DISEASE - continue PPI  Asthma, chronic - No signs/symptoms of exacerbation - Continue as needed albuterol  Severe obesity (BMI >= 40) (HCC) - Complicates overall prognosis and care - Body mass index is 37.3 kg/m.   Old records reviewed in assessment of this patient  Antimicrobials:   DVT prophylaxis:  SCDs Start: 06/03/23 1621 apixaban (ELIQUIS) tablet 5 mg   Code Status:   Code Status: Full Code  Mobility Assessment (last 72 hours)     Mobility Assessment     Row Name 06/04/23 2330 06/04/23 1613 06/04/23  0730 06/03/23 2200 06/03/23 1733   Does patient have an order for bedrest or is patient medically unstable Yes- Bedfast (Level 1) - Complete -- Yes- Bedfast (Level 1) - Complete Yes- Bedfast (Level 1) -  Complete No - Continue assessment   What is the highest level of mobility based on the progressive mobility assessment? Level 1 (Bedfast) - Unable to balance while sitting on edge of bed Level 1 (Bedfast) - Unable to balance while sitting on edge of bed  knee is huring and is unable to get out of bed this afternoon Level 2 (Chairfast) - Balance while sitting on edge of bed and cannot stand -- Level 2 (Chairfast) - Balance while sitting on edge of bed and cannot stand   Is the above level different from baseline mobility prior to current illness? Yes - Recommend PT order -- Yes - Recommend PT order -- Yes - Recommend PT order            Barriers to discharge: none Disposition Plan:  Home with HH vs SNF Status is: Inpt  Objective: Blood pressure 127/74, pulse 74, temperature 99.4 F (37.4 C), temperature source Oral, resp. rate 20, height 5\' 3"  (1.6 m), weight 95.5 kg, SpO2 96 %.  Examination:  Physical Exam Constitutional:      General: He is not in acute distress.    Appearance: He is not ill-appearing.  HENT:     Head: Normocephalic and atraumatic.     Mouth/Throat:     Mouth: Mucous membranes are moist.  Eyes:     Extraocular Movements: Extraocular movements intact.  Cardiovascular:     Rate and Rhythm: Normal rate. Rhythm irregular.  Pulmonary:     Effort: Pulmonary effort is normal.     Breath sounds: Normal breath sounds.  Abdominal:     General: Bowel sounds are normal. There is no distension.     Palpations: Abdomen is soft.     Tenderness: There is no abdominal tenderness.  Musculoskeletal:     Cervical back: Normal range of motion and neck supple.     Comments: L knee swelling noted but no erythema, tenderness out of proportion, nor calor  Skin:    General: Skin is warm and dry.  Neurological:     General: No focal deficit present.     Mental Status: He is alert.  Psychiatric:        Mood and Affect: Mood normal.      Consultants:  GI Cardiology Urology    Procedures:    Data Reviewed: Results for orders placed or performed during the hospital encounter of 06/03/23 (from the past 24 hour(s))  Gastrointestinal Panel by PCR , Stool     Status: None   Collection Time: 06/04/23  4:11 PM   Specimen: Rectum; Stool  Result Value Ref Range   Campylobacter species NOT DETECTED NOT DETECTED   Plesimonas shigelloides NOT DETECTED NOT DETECTED   Salmonella species NOT DETECTED NOT DETECTED   Yersinia enterocolitica NOT DETECTED NOT DETECTED   Vibrio species NOT DETECTED NOT DETECTED   Vibrio cholerae NOT DETECTED NOT DETECTED   Enteroaggregative E coli (EAEC) NOT DETECTED NOT DETECTED   Enteropathogenic E coli (EPEC) NOT DETECTED NOT DETECTED   Enterotoxigenic E coli (ETEC) NOT DETECTED NOT DETECTED   Shiga like toxin producing E coli (STEC) NOT DETECTED NOT DETECTED   Shigella/Enteroinvasive E coli (EIEC) NOT DETECTED NOT DETECTED   Cryptosporidium NOT DETECTED NOT DETECTED   Cyclospora cayetanensis NOT DETECTED NOT  DETECTED   Entamoeba histolytica NOT DETECTED NOT DETECTED   Giardia lamblia NOT DETECTED NOT DETECTED   Adenovirus F40/41 NOT DETECTED NOT DETECTED   Astrovirus NOT DETECTED NOT DETECTED   Norovirus GI/GII NOT DETECTED NOT DETECTED   Rotavirus A NOT DETECTED NOT DETECTED   Sapovirus (I, II, IV, and V) NOT DETECTED NOT DETECTED  CBC     Status: Abnormal   Collection Time: 06/05/23  5:25 AM  Result Value Ref Range   WBC 7.7 4.0 - 10.5 K/uL   RBC 3.52 (L) 4.22 - 5.81 MIL/uL   Hemoglobin 10.2 (L) 13.0 - 17.0 g/dL   HCT 40.9 (L) 81.1 - 91.4 %   MCV 94.6 80.0 - 100.0 fL   MCH 29.0 26.0 - 34.0 pg   MCHC 30.6 30.0 - 36.0 g/dL   RDW 78.2 (H) 95.6 - 21.3 %   Platelets 236 150 - 400 K/uL   nRBC 0.0 0.0 - 0.2 %  Comprehensive metabolic panel     Status: Abnormal   Collection Time: 06/05/23  5:25 AM  Result Value Ref Range   Sodium 136 135 - 145 mmol/L   Potassium 4.1 3.5 - 5.1 mmol/L   Chloride 103 98 - 111 mmol/L   CO2 25 22  - 32 mmol/L   Glucose, Bld 132 (H) 70 - 99 mg/dL   BUN 26 (H) 8 - 23 mg/dL   Creatinine, Ser 0.86 0.61 - 1.24 mg/dL   Calcium 8.9 8.9 - 57.8 mg/dL   Total Protein 6.0 (L) 6.5 - 8.1 g/dL   Albumin 3.2 (L) 3.5 - 5.0 g/dL   AST 19 15 - 41 U/L   ALT 15 0 - 44 U/L   Alkaline Phosphatase 63 38 - 126 U/L   Total Bilirubin 0.6 0.3 - 1.2 mg/dL   GFR, Estimated >46 >96 mL/min   Anion gap 8 5 - 15    I have reviewed pertinent nursing notes, vitals, labs, and images as necessary. I have ordered labwork to follow up on as indicated.  I have reviewed the last notes from staff over past 24 hours. I have discussed patient's care plan and test results with nursing staff, CM/SW, and other staff as appropriate.  Time spent: Greater than 50% of the 55 minute visit was spent in counseling/coordination of care for the patient as laid out in the A&P.   LOS: 2 days   Lewie Chamber, MD [facility] 06/05/2023, 3:26 PM

## 2023-06-05 NOTE — Assessment & Plan Note (Addendum)
-   Barry [name] is concerned about his ability to return home independently - followup PT/OT evals: SNF recommended, may even need LTC

## 2023-06-05 NOTE — Assessment & Plan Note (Signed)
-   s/p fall at home - xray shows degenerative changes and moderate joint effusion -No erythema mass, calor, exquisite tenderness; low suspicion for infection at this time - Continue knee immobilizer if helps and supportive care

## 2023-06-05 NOTE — Assessment & Plan Note (Signed)
-   No signs/symptoms of exacerbation - Continue as needed albuterol

## 2023-06-06 DIAGNOSIS — Z7901 Long term (current) use of anticoagulants: Secondary | ICD-10-CM

## 2023-06-06 DIAGNOSIS — Z5181 Encounter for therapeutic drug level monitoring: Secondary | ICD-10-CM

## 2023-06-06 DIAGNOSIS — N390 Urinary tract infection, site not specified: Secondary | ICD-10-CM | POA: Diagnosis not present

## 2023-06-06 DIAGNOSIS — N3289 Other specified disorders of bladder: Secondary | ICD-10-CM | POA: Diagnosis not present

## 2023-06-06 DIAGNOSIS — I480 Paroxysmal atrial fibrillation: Secondary | ICD-10-CM | POA: Diagnosis not present

## 2023-06-06 DIAGNOSIS — Z01810 Encounter for preprocedural cardiovascular examination: Secondary | ICD-10-CM | POA: Diagnosis not present

## 2023-06-06 DIAGNOSIS — R319 Hematuria, unspecified: Secondary | ICD-10-CM | POA: Diagnosis not present

## 2023-06-06 LAB — COMPREHENSIVE METABOLIC PANEL
ALT: 13 U/L (ref 0–44)
AST: 16 U/L (ref 15–41)
Albumin: 2.9 g/dL — ABNORMAL LOW (ref 3.5–5.0)
Alkaline Phosphatase: 62 U/L (ref 38–126)
Anion gap: 8 (ref 5–15)
BUN: 24 mg/dL — ABNORMAL HIGH (ref 8–23)
CO2: 22 mmol/L (ref 22–32)
Calcium: 8.3 mg/dL — ABNORMAL LOW (ref 8.9–10.3)
Chloride: 103 mmol/L (ref 98–111)
Creatinine, Ser: 0.82 mg/dL (ref 0.61–1.24)
GFR, Estimated: >60 mL/min (ref >60)
Glucose, Bld: 114 mg/dL — ABNORMAL HIGH (ref 70–99)
Potassium: 3.6 mmol/L (ref 3.5–5.1)
Sodium: 133 mmol/L — ABNORMAL LOW (ref 135–145)
Total Bilirubin: 0.7 mg/dL (ref 0.3–1.2)
Total Protein: 5.7 g/dL — ABNORMAL LOW (ref 6.5–8.1)

## 2023-06-06 LAB — CBC
HCT: 33.7 % — ABNORMAL LOW (ref 39.0–52.0)
Hemoglobin: 10.1 g/dL — ABNORMAL LOW (ref 13.0–17.0)
MCH: 28.1 pg (ref 26.0–34.0)
MCHC: 30 g/dL (ref 30.0–36.0)
MCV: 93.6 fL (ref 80.0–100.0)
Platelets: 234 10*3/uL (ref 150–400)
RBC: 3.6 MIL/uL — ABNORMAL LOW (ref 4.22–5.81)
RDW: 17.1 % — ABNORMAL HIGH (ref 11.5–15.5)
WBC: 8.5 10*3/uL (ref 4.0–10.5)
nRBC: 0 % (ref 0.0–0.2)

## 2023-06-06 LAB — MAGNESIUM: Magnesium: 1.9 mg/dL (ref 1.7–2.4)

## 2023-06-06 MED ORDER — OXYCODONE HCL 5 MG PO TABS
5.0000 mg | ORAL_TABLET | ORAL | Status: DC | PRN
  Administered 2023-06-06 – 2023-06-08 (×7): 5 mg via ORAL
  Filled 2023-06-06 (×7): qty 1

## 2023-06-06 NOTE — TOC Initial Note (Addendum)
Transition of Care [facility]) - Initial/Assessment Note    Patient Details  Name: Barry [name] MRN: [id number] Date of Birth: Apr 23, 1946  Transition of [facility]) CM/SW Contact:    Adrian Prows, RN Phone Number: 06/06/2023, 12:30 PM  Clinical Narrative:                 [facility] for SNF planning; spoke w/ pt and friend Christin Bach in room; she is pt's POC [phone]; pt says she is from home but plans to d/c to SNF; pt says he has glasses, lower dentures, and grab bars in shower;pt also has walker; he denies IPV, food/housing insecurity, and difficulty paying utilities; he would like resources for financial assistance and social services in case he needs help at later date; he also agrees to recc SNF; explained SNF process; he would like the search limited to [facility] and [facility] is his facility of choice; resources for social services and financial assist placed in d/c instructions; pt also given copied of resources; TOC will follow.  [phone] faxed out via hub bed offers Expected Discharge Plan: Skilled Nursing Facility Barriers to Discharge: Continued Medical Work up   Patient Goals and CMS Choice Patient states their goals for this hospitalization and ongoing recovery are:: SNF          Expected Discharge Plan and Services   Discharge Planning Services: CM Consult   Living arrangements for the past 2 months: Apartment                                      Prior Living Arrangements/Services Living arrangements for the past 2 months: Apartment Lives with:: Self Patient language and need for interpreter reviewed:: Yes Do you feel safe going back to the place where you live?: Yes      Need for Family Participation in Patient Care: Yes (Comment) Care giver support system in place?: Yes (comment) Current home services: DME (walker) Criminal Activity/Legal Involvement Pertinent to Current Situation/Hospitalization: No - Comment as needed  Activities  of Daily Living Home Assistive Devices/Equipment: Walker (specify type), Eyeglasses ADL Screening (condition at time of admission) Patient's cognitive ability adequate to safely complete daily activities?: No Is the patient deaf or have difficulty hearing?: No Does the patient have difficulty seeing, even when wearing glasses/contacts?: No Does the patient have difficulty concentrating, remembering, or making decisions?: No Patient able to express need for assistance with ADLs?: Yes Does the patient have difficulty dressing or bathing?: Yes Independently performs ADLs?: Yes (appropriate for developmental age) Does the patient have difficulty walking or climbing stairs?: Yes Weakness of Legs: Both Weakness of Arms/Hands: None  Permission Sought/Granted Permission sought to share information with : Case Manager Permission granted to share information with : Yes, Verbal Permission Granted  Share Information with NAME: Case Manager     Permission granted to share info w Relationship: Christin Bach (friend) [phone]     Emotional Assessment Appearance:: Appears stated age Attitude/Demeanor/Rapport: Gracious Affect (typically observed): Accepting Orientation: : Oriented to Self, Oriented to Place, Oriented to  Time, Oriented to Situation Alcohol / Substance Use: Not Applicable Psych Involvement: No (comment)  Admission diagnosis:  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] Atrial fibrillation with rapid ventricular response (HCC) [I48.91] Acute cystitis with hematuria [N30.01] Near syncope [R55] Complicated UTI (urinary tract infection) [N39.0] Diarrhea, unspecified type [R19.7] Patient Active Problem List   Diagnosis Date Noted   Hematuria 06/05/2023  Physical deconditioning 06/05/2023   Effusion of left knee 06/05/2023   Preoperative cardiovascular examination 06/04/2023   Hx of CABG 06/04/2023   Complicated UTI (urinary tract infection) 06/03/2023   Bladder mass 06/03/2023    Pressure injury of skin 06/03/2023   Lower GI bleed 06/03/2023   PAF (paroxysmal atrial fibrillation) (HCC) 01/11/2023   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 (HCC) 01/11/2023   Seborrheic dermatitis 09/02/2020   CAD (coronary artery disease), native coronary artery 02/06/2019   Benign essential HTN 02/06/2019   Hyperlipidemia LDL goal <70 02/06/2019   Excessive ear wax, bilateral 09/15/2018   Acute sinusitis 03/24/2012   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   Chronic rhinitis 05/02/2009   GASTROESOPHAGEAL REFLUX DISEASE 08/27/2008   Severe obesity (BMI >= 40) (HCC) 11/24/2007   Asthma, chronic 11/24/2007   ULCERATIVE COLITIS 11/24/2007   PCP:  Georgann Housekeeper, MD Pharmacy:   [id number] - [place], [place] - [address] [place] [address] Phone: [phone] Fax: [phone]     Social Determinants of Health (SDOH) Social History: SDOH Screenings   Food Insecurity: No Food Insecurity (06/06/2023)  Housing: Low Risk  (06/06/2023)  Transportation Needs: No Transportation Needs (06/06/2023)  Utilities: Not At Risk (06/06/2023)  Tobacco Use: Low Risk  (06/04/2023)   SDOH Interventions: Food Insecurity Interventions: Inpatient TOC, Intervention Not Indicated Housing Interventions: Intervention Not Indicated, Inpatient TOC Transportation Interventions: Intervention Not Indicated, Inpatient TOC Utilities Interventions: Intervention Not Indicated, Inpatient TOC   Readmission Risk Interventions     No data to display

## 2023-06-06 NOTE — Evaluation (Addendum)
Physical Therapy Evaluation Patient Details Name: Barry [name] MRN: [id number] DOB: [birth year] Today's Date: 06/06/2023  History of Present Illness  Barry [name] is a 77 year old male who presented with left knee pain/swelling after a fall at home and also reports of some hematuria. PMH A-fib, asthma, CAD, GERD, glaucoma, HLD, HTN, obesity, osteoarthritis, ulcerative colitis  Clinical Impression  Pt admitted with above diagnosis. Pt reports from home in senior apartment community, ind with RW for household ambulation, sleeps in lift chair, ind with self care tasks, friend performs household chores. Pt reports 3 falls in last 6 months, had to call EMS/fire department for assistance up. Pt currently significantly limited with bed mobility and and transfers, needing max-total A+2 to come to sitting EOB and then power up to standing. Pt unable to reach full standing; needing mod A for lateral scoot up to [facility]. Pt's BLE with significant genu valgum and external rotation, noted callus on medial knees. Recommend post acute PT (<3 hrs/day) prior to return home alone. Pt currently with functional limitations due to the deficits listed below (see PT Problem List). Pt will benefit from acute skilled PT to increase their independence and safety with mobility to allow discharge.          Recommendations for follow up therapy are one component of a multi-disciplinary discharge planning process, led by the attending physician.  Recommendations may be updated based on patient status, additional functional criteria and insurance authorization.  Follow Up Recommendations Can patient physically be transported by private vehicle: No     Assistance Recommended at Discharge Frequent or constant Supervision/Assistance  Patient can return home with the following  Two people to help with walking and/or transfers;A lot of help with bathing/dressing/bathroom;Assistance with cooking/housework;Assist for transportation     Equipment Recommendations None recommended by PT  Recommendations for Other Services       Functional Status Assessment Patient has had a recent decline in their functional status and demonstrates the ability to make significant improvements in function in a reasonable and predictable amount of time.     Precautions / Restrictions Precautions Precautions: Fall Required Braces or Orthoses: Knee Immobilizer - Left Knee Immobilizer - Left: Other (comment) ("At all times except when in CPM/PT and when providing other necessary care") Restrictions Weight Bearing Restrictions: No      Mobility  Bed Mobility Overal bed mobility: Needs Assistance Bed Mobility: Supine to Sit, Sit to Supine, Rolling Rolling: Mod assist   Supine to sit: Max assist, +2 for physical assistance Sit to supine: Max assist, +2 for physical assistance   General bed mobility comments: max A+2 for supine<>sit for BLE and trunk management, limited bil hip abduction, pt attempting to pull on bedrail or thearpist's hand to upright trunk but unable needing assist at trunk; mod A to roll R and L with bedpad to reposition linen under pt    Transfers Overall transfer level: Needs assistance Equipment used: Rolling walker (2 wheels) Transfers: Sit to/from Stand Sit to Stand: Total assist, +2 physical assistance          Lateral/Scoot Transfers: Mod assist General transfer comment: pt with significantly limited bil knee flexion limiting ability to power to stand, total A +2 to clear pt's bottom from bed unable to achieve full upright standing; mod A with bedpad to laterally scoot up to [facility]; total A to scoot up in bed with bedpads and bed assist    Ambulation/Gait  General Gait Details: unable  Stairs            Wheelchair Mobility    Modified Rankin (Stroke Patients Only)       Balance Overall balance assessment: Needs assistance Sitting-balance support: Feet supported, Bilateral  upper extremity supported Sitting balance-Leahy Scale: Fair       Standing balance-Leahy Scale: Zero                               Pertinent Vitals/Pain Pain Assessment Pain Assessment: Faces Faces Pain Scale: Hurts even more Pain Location: bil knees Pain Descriptors / Indicators: Sore, Tender Pain Intervention(s): Limited activity within patient's tolerance, Monitored during session, Repositioned    Home Living Family/patient expects to be discharged to:: Private residence Living Arrangements: Alone Available Help at Discharge: Family;Available PRN/intermittently Type of Home: Apartment Data processing manager) Home Access: Level entry       Home Layout: One level Home Equipment: Agricultural consultant (2 wheels)      Prior Function Prior Level of Function : Independent/Modified Independent;History of Falls (last six months)             Mobility Comments: pt reports ind with RW for household distances, pt reports ~3 falls in the last 6 months ADLs Comments: pt reports ind with self care, friend Consuella Lose helps with cooking and cleaning     Hand Dominance   Dominant Hand: Right    Extremity/Trunk Assessment   Upper Extremity Assessment Upper Extremity Assessment: Defer to OT evaluation    Lower Extremity Assessment Lower Extremity Assessment: RLE deficits/detail;LLE deficits/detail RLE Deficits / Details: pt resting with significant LE external rotation throughout hip knee and ankle, knee limited to ~60 deg flexion, AROM ~50% at hip and knee, strength grossly 2-/5, genu valgus position of legs with callus noted on inner knee RLE Sensation: WNL LLE Deficits / Details: pt resting with significant LE external rotation throughout hip knee and ankle, knee limited to ~75 deg flexion, AROM ~50% at hip and knee, strength grossly 2-/5, genu valgus position of legs with callus noted on inner knee, significant gross knee swelling noted LLE Sensation: WNL     Cervical / Trunk Assessment Cervical / Trunk Assessment: Kyphotic  Communication   Communication: No difficulties  Cognition Arousal/Alertness: Awake/alert Behavior During Therapy: WFL for tasks assessed/performed Overall Cognitive Status: Within Functional Limits for tasks assessed                                 General Comments: pt pleasant, able to follow 1 step commands, a&o        General Comments      Exercises     Assessment/Plan    PT Assessment Patient needs continued PT services  PT Problem List Decreased strength;Decreased range of motion;Decreased activity tolerance;Decreased balance;Decreased mobility;Decreased knowledge of use of DME;Pain       PT Treatment Interventions DME instruction;Gait training;Functional mobility training;Therapeutic activities;Therapeutic exercise;Balance training;Neuromuscular re-education;Patient/family education    PT Goals (Current goals can be found in the Care Plan section)  Acute Rehab PT Goals Patient Stated Goal: regain strength PT Goal Formulation: With patient Time For Goal Achievement: 06/20/23 Potential to Achieve Goals: Good    Frequency Min 1X/week     Co-evaluation               AM-PAC PT "6 Clicks" Mobility  Outcome Measure Help  needed turning from your back to your side while in a flat bed without using bedrails?: Total Help needed moving from lying on your back to sitting on the side of a flat bed without using bedrails?: Total Help needed moving to and from a bed to a chair (including a wheelchair)?: Total Help needed standing up from a chair using your arms (e.g., wheelchair or bedside chair)?: Total Help needed to walk in hospital room?: Total Help needed climbing 3-5 steps with a railing? : Total 6 Click Score: 6    End of Session Equipment Utilized During Treatment: Gait belt Activity Tolerance: Patient tolerated treatment well Patient left: in bed;with call bell/phone within  reach;with bed alarm set Nurse Communication: Mobility status PT Visit Diagnosis: Other abnormalities of gait and mobility (R26.89);Muscle weakness (generalized) (M62.81);Pain;History of falling (Z91.81) Pain - Right/Left:  (L>R) Pain - part of body: Knee    Time: 1610-9604 PT Time Calculation (min) (ACUTE ONLY): 33 min   Charges:   PT Evaluation $PT Eval Low Complexity: 1 Low PT Treatments $Therapeutic Activity: 8-22 mins        Tori Zoriah Pulice PT, DPT 06/06/23, 12:24 PM

## 2023-06-06 NOTE — Telephone Encounter (Signed)
Consuella Lose friend is retuning phone call. States patient not able to come in for appointment due to in the hospital. Patient gave permission to speak to [facility]. Consuella Lose phone number is [phone].

## 2023-06-06 NOTE — Progress Notes (Signed)
Subjective:  Denies SSCP, palpitations or Dyspnea Caretaker Consuella Lose in room  They want to know when surgery will be   Objective:  Vitals:   06/05/23 1949 06/05/23 2016 06/06/23 0355 06/06/23 0731  BP:  (!) 149/77 130/61   Pulse:  100 85   Resp:  (!) 24 (!) 24   Temp:  98.7 F (37.1 C) 98.6 F (37 C)   TempSrc:  Oral Oral   SpO2: 92% 93% 91% 93%  Weight:      Height:        Intake/Output from previous day:  Intake/Output Summary (Last 24 hours) at 06/06/2023 1009 Last data filed at 06/06/2023 0848 Gross per 24 hour  Intake 480 ml  Output 1600 ml  Net -1120 ml    Physical Exam: Frail elderly male Lungs clear Prior sternotomy no murmur Abdomen benign  No edema  Lab Results: Basic Metabolic Panel: Recent Labs    06/05/23 0525 06/06/23 0501  NA 136 133*  K 4.1 3.6  CL 103 103  CO2 25 22  GLUCOSE 132* 114*  BUN 26* 24*  CREATININE 0.84 0.82  CALCIUM 8.9 8.3*  MG  --  1.9   Liver Function Tests: Recent Labs    06/05/23 0525 06/06/23 0501  AST 19 16  ALT 15 13  ALKPHOS 63 62  BILITOT 0.6 0.7  PROT 6.0* 5.7*  ALBUMIN 3.2* 2.9*   No results for input(s): "LIPASE", "AMYLASE" in the last 72 hours. CBC: Recent Labs    06/05/23 0525 06/06/23 0501  WBC 7.7 8.5  HGB 10.2* 10.1*  HCT 33.3* 33.7*  MCV 94.6 93.6  PLT 236 234     Imaging: DG Knee 3 Views Left  Result Date: 06/04/2023 CLINICAL DATA:  Left knee pain, no known injury, initial encounter EXAM: LEFT KNEE - 3 VIEW COMPARISON:  06/25/2022 FINDINGS: Extensive degenerative changes are noted worst in the lateral joint space moderate joint effusion is seen. The overall appearance is similar to that noted on the prior exam. Postsurgical changes along the medial calf are seen. IMPRESSION: Degenerative changes stable from the prior exam. Electronically Signed   By: Alcide Clever M.D.   On: 06/04/2023 19:45    Cardiac Studies:  ECG: SR no acute changes    Telemetry:  SR/PAF  Echo: 1/24 EF 50-55%    Medications:    apixaban  5 mg Oral BID   atorvastatin  40 mg Oral Daily   cycloSPORINE  1 drop Both Eyes BID   diltiazem  240 mg Oral Daily   escitalopram  20 mg Oral Daily   ezetimibe  10 mg Oral Daily   latanoprost  1 drop Both Eyes QHS   losartan  25 mg Oral Daily   metoprolol tartrate  50 mg Oral BID   mometasone-formoterol  2 puff Inhalation BID   pantoprazole  40 mg Oral Daily   sulfaSALAzine  1,000 mg Oral QID      cefTRIAXone (ROCEPHIN)  IV 1 g (06/05/23 1540)    Assessment/Plan:   Preoperative:  poor functional status but no angina low normal EF and low risk myovue 08/13/22 diaphragmatic attenuation EF 57% no ischemia Needs bladder surgery for presumed cancer Does not appear as though any surgery to be done  PAF  cardizem and lopressor Eliquis resumed  CABG:  see above no angina low risk myovue 10/2022 UC:  stable flare not needed steroids at this time on protonix and sulfasalazine  Bladder Cancer Does not appear  to be having any procedures/surgery  Needs f/u with urology Will be d/c to SNF single kidney Cr normal 0.84   Cardiology will sign off   Charlton Haws 06/06/2023, 10:09 AM

## 2023-06-06 NOTE — Evaluation (Signed)
Occupational Therapy Evaluation Patient Details Name: Barry [name] MRN: [id number] DOB: 12-27-1945 Today's Date: 06/06/2023   History of Present Illness Barry [name] is a 77 year old male who presented with left knee pain/swelling after a fall at home and also reports of some hematuria. PMH A-fib, asthma, CAD, GERD, glaucoma, HLD, HTN, obesity, osteoarthritis, ulcerative colitis   Clinical Impression   Patient is currently requiring assistance with ADLs including Total assist with bed level Lower body ADLs, moderate assist with Upper body ADLs at EOB,  as well as  Max-Total assist of 2 people with bed mobility and inability to stand from elevated EOB to Steady with Max As of 2.   Current level of function is below patient's typical baseline which pt's POC clarified has been on declined with pt's apartment "unlivable" with diarrhea and soiled clothing as well as dirty dishes all over apartment.   During this evaluation, patient was limited by generalized weakness, limited shoulder ROM and Bil knee ROM, Bil knee pain, impaired activity tolerance, and cognitive deficits, all of which has the potential to impact patient's safety and independence during functional mobility, as well as performance for ADLs.  Patient lives alone at [facility].  Pt's friend can provide PRN assistance but is elderly and would be unsafe to try and assist pt at this time.   Patient demonstrates fair rehab potential, and should benefit from continued skilled occupational therapy services while in acute care to maximize safety, independence and quality of life at home.  Continued occupational therapy services are recommended.      Recommendations for follow up therapy are one component of a multi-disciplinary discharge planning process, led by the attending physician.  Recommendations may be updated based on patient status, additional functional criteria and insurance authorization.   Assistance Recommended at Discharge  Frequent or constant Supervision/Assistance  Patient can return home with the following A lot of help with walking and/or transfers;Two people to help with walking and/or transfers;Assistance with cooking/housework;Two people to help with bathing/dressing/bathroom;A lot of help with bathing/dressing/bathroom;Direct supervision/assist for medications management;Direct supervision/assist for financial management;Assist for transportation;Help with stairs or ramp for entrance    Functional Status Assessment  Patient has had a recent decline in their functional status and demonstrates the ability to make significant improvements in function in a reasonable and predictable amount of time.  Equipment Recommendations   (Will defer to post-acute recommendations)    Recommendations for Other Services       Precautions / Restrictions Precautions Precautions: Fall Required Braces or Orthoses: Knee Immobilizer - Left Knee Immobilizer - Left: Other (comment) (KI at all times except when CMP/PT or when providing necessary care.) Restrictions Other Position/Activity Restrictions: Limited Bil knee flexion      Mobility Bed Mobility Overal bed mobility: Needs Assistance Bed Mobility: Supine to Sit, Sit to Supine     Supine to sit: Max assist, +2 for physical assistance, HOB elevated Sit to supine: Total assist, +2 for physical assistance, HOB elevated        Transfers                          Balance Overall balance assessment: Needs assistance Sitting-balance support: Feet supported, Bilateral upper extremity supported Sitting balance-Leahy Scale: Poor   Postural control: Posterior lean   Standing balance-Leahy Scale: Zero  ADL either performed or assessed with clinical judgement   ADL Overall ADL's : Needs assistance/impaired Eating/Feeding: Set up;Bed level   Grooming: Wash/dry hands;Set up;Bed level   Upper Body Bathing: Minimal  assistance;Bed level   Lower Body Bathing: Maximal assistance;+2 for physical assistance;Bed level   Upper Body Dressing : Moderate assistance;Sitting Upper Body Dressing Details (indicate cue type and reason): Assist for task and for balance. Lower Body Dressing: Total assistance;Sitting/lateral leans;Bed level   Toilet Transfer: +2 for physical assistance;+2 for safety/equipment;Maximal assistance Toilet Transfer Details (indicate cue type and reason): Stedy brough tto room and pt shown how to use. Pt's bed was elevated to compensate for limited knee flexion. Pt attempted to stand twice from EOB with MAx As of 2 people to Steady but unable to clear hips. Pt reported increased pain to knees and agreeable to return to supine. Toileting- Clothing Manipulation and Hygiene: Total assistance;Bed level Toileting - Clothing Manipulation Details (indicate cue type and reason): On external cathater.     Functional mobility during ADLs: Maximal assistance;Total assistance;+2 for physical assistance       Vision Patient Visual Report: No change from baseline Additional Comments: Apparent strabismus. Pt denied vision deficits.     Perception     Praxis      Pertinent Vitals/Pain Pain Assessment Pain Assessment: 0-10 Pain Score: 8  Pain Location: bil knees Pain Descriptors / Indicators: Sore, Tender Pain Intervention(s): Limited activity within patient's tolerance, Monitored during session, Repositioned, Patient requesting pain meds-RN notified, RN gave pain meds during session (RN to room with pain meds once pt back in supine.)     Hand Dominance Right   Extremity/Trunk Assessment Upper Extremity Assessment Upper Extremity Assessment: Generalized weakness;RUE deficits/detail RUE Deficits / Details: Strong from elbow to Grip. ROM and strength limited to Bil shoulders with LT worse than RT and pt using compensatory scaption to raise LT arm to ~80 degrees. Pt tolerating AAROM of each  shoulder to about 100 degrees.   Lower Extremity Assessment Lower Extremity Assessment: RLE deficits/detail;LLE deficits/detail RLE Deficits / Details: pt resting with significant BLE external rotation throughout hip knee and ankle, knee limited to ~60 deg flexion, AROM ~50% at hip and knee, strength grossly 2-/5, genu valgus position of legs with callus noted on inner knee RLE Sensation: WNL LLE Deficits / Details: pt resting with significant LE external rotation throughout hip knee and ankle, knee limited to ~75 deg flexion, AROM ~50% at hip and knee, strength grossly 2-/5, genu valgus position of legs with callus noted on inner knee, significant gross knee swelling noted LLE Sensation: WNL   Cervical / Trunk Assessment Cervical / Trunk Assessment: Kyphotic   Communication Communication Communication: No difficulties   Cognition Arousal/Alertness: Awake/alert Behavior During Therapy: WFL for tasks assessed/performed Overall Cognitive Status: Impaired/Different from baseline Area of Impairment: Memory, Safety/judgement, Awareness, Problem solving                     Memory: Decreased recall of precautions, Decreased short-term memory   Safety/Judgement: Decreased awareness of deficits Awareness: Emergent Problem Solving: Requires verbal cues, Requires tactile cues General Comments: pt pleasant, able to follow 1 step commands, a&o     General Comments       Exercises     Shoulder Instructions      Home Living Family/patient expects to be discharged to:: Private residence Living Arrangements: Alone Available Help at Discharge: Friend(s) Type of Home: Apartment Scientist, research (medical) [address] apartments) Home Access: Level entry  Home Layout: One level     Bathroom Shower/Tub: Chief Strategy Officer: Handicapped height     Home Equipment: Agricultural consultant (2 wheels);Grab bars - tub/shower;Grab bars - toilet   Additional Comments: pt reports sleeping in lift  chair.      Prior Functioning/Environment Prior Level of Function : Needs assist;History of Falls (last six months)       Physical Assist : Mobility (physical);ADLs (physical)     Mobility Comments: pt reports ind with RW for household distances, pt reports ~3 falls in the last 6 months ADLs Comments: pt reports ind with self care, friend Consuella Lose helps with cooking and cleaning.  Consuella Lose clarified that pt has been unable to sufficiently care for himself or his home recently stating pt had soiled clothing and dirty pots/dishes in apartment and apartment looked, "Unlivable". Pt had reported doing all his own cooking, cleaning and laundry fuine until Monday" showing some memory deficits        OT Problem List: Decreased strength;Decreased cognition;Decreased safety awareness;Obesity;Decreased activity tolerance;Decreased knowledge of use of DME or AE;Impaired UE functional use;Pain;Impaired balance (sitting and/or standing)      OT Treatment/Interventions: Self-care/ADL training;Therapeutic activities;Therapeutic exercise;Visual/perceptual remediation/compensation;Patient/family education;Balance training;DME and/or AE instruction    OT Goals(Current goals can be found in the care plan section) Acute Rehab OT Goals Patient Stated Goal: To go to Rehab OT Goal Formulation: With patient Time For Goal Achievement: 06/20/23 Potential to Achieve Goals: Fair ADL Goals Pt Will Perform Lower Body Dressing: with adaptive equipment;sitting/lateral leans;bed level;with min assist Pt Will Transfer to Toilet: with min assist;bedside commode;stand pivot transfer Pt/caregiver will Perform Home Exercise Program: Increased ROM;Increased strength;Both right and left upper extremity;With Supervision (May benefit from Towel on tabletop exercises to compensate for shoulder OA limitations) Additional ADL Goal #1: Pt will improve sitting balance to at least fair without UE support or external assitance and  tolerate 15 min EOB for gentle UE tasks such as grooming, HEP, UE dressing, or folding towels.  OT Frequency: Min 1X/week    Co-evaluation              AM-PAC OT "6 Clicks" Daily Activity     Outcome Measure Help from another person eating meals?: None Help from another person taking care of personal grooming?: A Little Help from another person toileting, which includes using toliet, bedpan, or urinal?: Total Help from another person bathing (including washing, rinsing, drying)?: A Lot Help from another person to put on and taking off regular upper body clothing?: A Lot Help from another person to put on and taking off regular lower body clothing?: Total 6 Click Score: 13   End of Session Equipment Utilized During Treatment: Gait belt Nurse Communication: Mobility status;Patient requests pain meds  Activity Tolerance: Patient limited by pain Patient left: in bed;with call bell/phone within reach;with bed alarm set;with nursing/sitter in room;with family/visitor present  OT Visit Diagnosis: Unsteadiness on feet (R26.81);Pain;History of falling (Z91.81);Muscle weakness (generalized) (M62.81);Repeated falls (R29.6);Other abnormalities of gait and mobility (R26.89) Pain - Right/Left: Left Pain - part of body: Knee (Bil knees)                Time: 1325-1405 OT Time Calculation (min): 40 min Charges:  OT General Charges $OT Visit: 1 Visit OT Evaluation $OT Eval Low Complexity: 1 Low OT Treatments $Therapeutic Activity: 23-37 mins  Victorino Dike, OT Acute Rehab Services Office: [phone] 06/06/2023  Theodoro Clock 06/06/2023, 2:31 PM

## 2023-06-06 NOTE — Progress Notes (Signed)
Progress Note    Barry [name]   ZOX:[id number]  DOB: Nov 27, 1946  DOA: 06/03/2023     3 PCP: Georgann Housekeeper, MD  Initial CC: fall at home, hematuria  Hospital Course: Barry [name] is a 77 year old male with PMH A-fib, asthma, CAD, GERD, glaucoma, HLD, HTN, obesity, osteoarthritis, ulcerative colitis who presented with left knee pain/swelling after a fall at home and also reports of some hematuria.  There was also questionable concern for hematochezia on admission but after further collateral information, it appears most of his symptoms were related to hematuria.  Initially GI had been consulted in setting of hemoglobin drop and the suspected hematochezia but GI bleed was ruled out after hematuria was the more prominent culprit.  He was not felt to have any exacerbation of his ulcerative colitis either. CT abdomen/pelvis was obtained which showed multiple bladder lesions.  Urology was also consulted and plan is for outpatient follow-up for possible TURBT.   Interval History:  No events overnight.  Still having hematuria noted in urine basin but Hgb remains stable. We will continue plan as before for now.  Awaiting SNF workup as well and patient understands possible need for LTC even and is amenable.   Assessment and Plan: * Complicated UTI (urinary tract infection) - UA noted with negative nitrite, negative LE, rare bacteria, greater than 50 WBC.  In setting of hematuria, he was started on antibiotics in case of underlying UTI as well -Continue Rocephin -Urine culture noted with <10k colonies - may just complete empiric course but ultimately suspect the hematuria is related to the lesions with possible contribution from Eliquis   Bladder mass - per CT A/P on 6/17: "Multiple mass lesions along the bladder lumen with some areas of dystrophic calcification near the margin of the right UVJ" -Has been evaluated by urology with tentative plans for outpatient follow-up on 06/28/2023 for  TURBT  Hematuria -Risks and benefits discussed regarding Eliquis with patient and his friend bedside.  We will reattempt initiation of Eliquis and monitoring for any worsening hematuria along with hemoglobin drop  PAF (paroxysmal atrial fibrillation) (HCC) - R/B discussed about stopping/resuming; for now he's okay with resuming and monitoring response of hematuria as well as watching Hgb   Physical deconditioning - his friend is concerned about his ability to return home independently - followup PT/OT evals: SNF recommended, may even need LTC  Effusion of left knee - s/p fall at home - xray shows degenerative changes and moderate joint effusion -No erythema mass, calor, exquisite tenderness; low suspicion for infection at this time - Continue knee immobilizer if helps and supportive care  Hx of CABG - Continue Eliquis, Lipitor, losartan, Lopressor  Hyperlipidemia LDL goal <70 - Continue ezetimibe  Benign essential HTN - Continue Cardizem, losartan, Lopressor  CAD (coronary artery disease), native coronary artery - Continue Eliquis, Lipitor, Lopressor  ULCERATIVE COLITIS - no s/s flare/exac at this time; evaluated by GI -Continue sulfasalazine  GASTROESOPHAGEAL REFLUX DISEASE - continue PPI  Asthma, chronic - No signs/symptoms of exacerbation - Continue as needed albuterol  Severe obesity (BMI >= 40) (HCC) - Complicates overall prognosis and care - Body mass index is 37.3 kg/m.   Old records reviewed in assessment of this patient  Antimicrobials: Rocephin 06/03/2023 >> current  DVT prophylaxis:  SCDs Start: 06/03/23 1621 apixaban (ELIQUIS) tablet 5 mg   Code Status:   Code Status: Full Code  Mobility Assessment (last 72 hours)     Mobility Assessment     Row  Name 06/06/23 1427 06/06/23 1222 06/06/23 0929 06/05/23 2230 06/05/23 1753   Does patient have an order for bedrest or is patient medically unstable -- -- No - Continue assessment Yes- Bedfast (Level  1) - Complete --   What is the highest level of mobility based on the progressive mobility assessment? Level 2 (Chairfast) - Balance while sitting on edge of bed and cannot stand Level 2 (Chairfast) - Balance while sitting on edge of bed and cannot stand Level 2 (Chairfast) - Balance while sitting on edge of bed and cannot stand Level 1 (Bedfast) - Unable to balance while sitting on edge of bed Level 1 (Bedfast) - Unable to balance while sitting on edge of bed   Is the above level different from baseline mobility prior to current illness? -- -- Yes - Recommend PT order Yes - Recommend PT order --    Row Name 06/05/23 0725 06/04/23 2330 06/04/23 1613 06/04/23 0730 06/03/23 2200   Does patient have an order for bedrest or is patient medically unstable No - Continue assessment Yes- Bedfast (Level 1) - Complete -- Yes- Bedfast (Level 1) - Complete Yes- Bedfast (Level 1) - Complete   What is the highest level of mobility based on the progressive mobility assessment? Level 2 (Chairfast) - Balance while sitting on edge of bed and cannot stand Level 1 (Bedfast) - Unable to balance while sitting on edge of bed Level 1 (Bedfast) - Unable to balance while sitting on edge of bed  knee is huring and is unable to get out of bed this afternoon Level 2 (Chairfast) - Balance while sitting on edge of bed and cannot stand --   Is the above level different from baseline mobility prior to current illness? Yes - Recommend PT order Yes - Recommend PT order -- Yes - Recommend PT order --    Row Name 06/03/23 1733           Does patient have an order for bedrest or is patient medically unstable No - Continue assessment       What is the highest level of mobility based on the progressive mobility assessment? Level 2 (Chairfast) - Balance while sitting on edge of bed and cannot stand       Is the above level different from baseline mobility prior to current illness? Yes - Recommend PT order                Barriers to  discharge: none Disposition Plan:  Home with HH vs SNF Status is: Inpt  Objective: Blood pressure 130/63, pulse 90, temperature (!) 97.5 F (36.4 C), temperature source Oral, resp. rate 20, height 5\' 3"  (1.6 m), weight 95.5 kg, SpO2 94 %.  Examination:  Physical Exam Constitutional:      General: He is not in acute distress.    Appearance: He is not ill-appearing.  HENT:     Head: Normocephalic and atraumatic.     Mouth/Throat:     Mouth: Mucous membranes are moist.  Eyes:     Extraocular Movements: Extraocular movements intact.  Cardiovascular:     Rate and Rhythm: Normal rate. Rhythm irregular.  Pulmonary:     Effort: Pulmonary effort is normal.     Breath sounds: Normal breath sounds.  Abdominal:     General: Bowel sounds are normal. There is no distension.     Palpations: Abdomen is soft.     Tenderness: There is no abdominal tenderness.  Musculoskeletal:     Cervical  back: Normal range of motion and neck supple.     Comments: L knee swelling noted but no erythema, tenderness out of proportion, nor calor  Skin:    General: Skin is warm and dry.  Neurological:     General: No focal deficit present.     Mental Status: He is alert.  Psychiatric:        Mood and Affect: Mood normal.      Consultants:  GI Cardiology Urology   Procedures:    Data Reviewed: Results for orders placed or performed during the hospital encounter of 06/03/23 (from the past 24 hour(s))  CBC     Status: Abnormal   Collection Time: 06/06/23  5:01 AM  Result Value Ref Range   WBC 8.5 4.0 - 10.5 K/uL   RBC 3.60 (L) 4.22 - 5.81 MIL/uL   Hemoglobin 10.1 (L) 13.0 - 17.0 g/dL   HCT 78.2 (L) 95.6 - 21.3 %   MCV 93.6 80.0 - 100.0 fL   MCH 28.1 26.0 - 34.0 pg   MCHC 30.0 30.0 - 36.0 g/dL   RDW 08.6 (H) 57.8 - 46.9 %   Platelets 234 150 - 400 K/uL   nRBC 0.0 0.0 - 0.2 %  Comprehensive metabolic panel     Status: Abnormal   Collection Time: 06/06/23  5:01 AM  Result Value Ref Range    Sodium 133 (L) 135 - 145 mmol/L   Potassium 3.6 3.5 - 5.1 mmol/L   Chloride 103 98 - 111 mmol/L   CO2 22 22 - 32 mmol/L   Glucose, Bld 114 (H) 70 - 99 mg/dL   BUN 24 (H) 8 - 23 mg/dL   Creatinine, Ser 6.29 0.61 - 1.24 mg/dL   Calcium 8.3 (L) 8.9 - 10.3 mg/dL   Total Protein 5.7 (L) 6.5 - 8.1 g/dL   Albumin 2.9 (L) 3.5 - 5.0 g/dL   AST 16 15 - 41 U/L   ALT 13 0 - 44 U/L   Alkaline Phosphatase 62 38 - 126 U/L   Total Bilirubin 0.7 0.3 - 1.2 mg/dL   GFR, Estimated >52 >84 mL/min   Anion gap 8 5 - 15  Magnesium     Status: None   Collection Time: 06/06/23  5:01 AM  Result Value Ref Range   Magnesium 1.9 1.7 - 2.4 mg/dL    I have reviewed pertinent nursing notes, vitals, labs, and images as necessary. I have ordered labwork to follow up on as indicated.  I have reviewed the last notes from staff over past 24 hours. I have discussed patient's care plan and test results with nursing staff, CM/SW, and other staff as appropriate.  Time spent: Greater than 50% of the 55 minute visit was spent in counseling/coordination of care for the patient as laid out in the A&P.   LOS: 3 days   Lewie Chamber, MD [facility] 06/06/2023, 4:07 PM

## 2023-06-06 NOTE — NC FL2 (Signed)
[place] MEDICAID FL2 LEVEL OF CARE FORM     IDENTIFICATION  Patient Name: Barry [name] Birthdate: Aug 12, 1946 Sex: male Admission Date (Current Location): 06/03/2023  [facility] and [place] Number:  Producer, television/film/video and Address:  [facility],  [address], [place] [address]      Provider Number:    Attending Physician Name and Address:  Lewie Chamber, MD  Relative Name and Phone Number:  Christin Bach (friend) [phone]    Current Level of Care: Hospital Recommended Level of Care: Skilled Nursing Facility Prior Approval Number:    Date Approved/Denied:   PASRR Number: [id number]  Discharge Plan: SNF    Current Diagnoses: Patient Active Problem List   Diagnosis Date Noted   Hematuria 06/05/2023   Physical deconditioning 06/05/2023   Effusion of left knee 06/05/2023   Preoperative cardiovascular examination 06/04/2023   Hx of CABG 06/04/2023   Complicated UTI (urinary tract infection) 06/03/2023   Bladder mass 06/03/2023   Pressure injury of skin 06/03/2023   Lower GI bleed 06/03/2023   PAF (paroxysmal atrial fibrillation) (HCC) 01/11/2023   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 (HCC) 01/11/2023   Seborrheic dermatitis 09/02/2020   CAD (coronary artery disease), native coronary artery 02/06/2019   Benign essential HTN 02/06/2019   Hyperlipidemia LDL goal <70 02/06/2019   Excessive ear wax, bilateral 09/15/2018   Acute sinusitis 03/24/2012   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   Chronic rhinitis 05/02/2009   GASTROESOPHAGEAL REFLUX DISEASE 08/27/2008   Severe obesity (BMI >= 40) (HCC) 11/24/2007   Asthma, chronic 11/24/2007   ULCERATIVE COLITIS 11/24/2007    Orientation RESPIRATION BLADDER Height & Weight     Self, Time, Situation, Place  Normal Continent Weight: 95.5 kg Height:  5\' 3"  (160 cm)  BEHAVIORAL SYMPTOMS/MOOD NEUROLOGICAL BOWEL NUTRITION STATUS      Continent Diet (Regular)  AMBULATORY STATUS  COMMUNICATION OF NEEDS Skin   Extensive Assist (bil LE weakness and limited mobility; left knee swollen, left knee immobilizer) Verbally Skin abrasions (Abrasions bil LEs; erythema bil buttocks)                       Personal Care Assistance Level of Assistance  Bathing, Feeding, Dressing Bathing Assistance: Maximum assistance Feeding assistance: Independent Dressing Assistance: Maximum assistance     Functional Limitations Info  Sight, Hearing, Speech Sight Info: Impaired Hearing Info: Adequate Speech Info: Adequate    SPECIAL CARE FACTORS FREQUENCY  PT (By licensed PT), OT (By licensed OT)     PT Frequency: 5x/week OT Frequency: 5x/week            Contractures      Additional Factors Info  Code Status, Allergies Code Status Info: Full Allergies Info: Piroxicam           Current Medications (06/06/2023):  This is the current hospital active medication list Current Facility-Administered Medications  Medication Dose Route Frequency Provider Last Rate Last Admin   acetaminophen (TYLENOL) tablet 650 mg  650 mg Oral Q6H PRN Bobette Mo, MD   650 mg at 06/06/23 0416   Or   acetaminophen (TYLENOL) suppository 650 mg  650 mg Rectal Q6H PRN Bobette Mo, MD       albuterol (PROVENTIL) (2.5 MG/3ML) 0.083% nebulizer solution 2.5 mg  2.5 mg Nebulization Q4H PRN Lynden Ang, RPH       apixaban Everlene Balls) tablet 5 mg  5 mg Oral BID Lewie Chamber, MD   5 mg at 06/06/23  1610   atorvastatin (LIPITOR) tablet 40 mg  40 mg Oral Daily Bobette Mo, MD   40 mg at 06/06/23 9604   cefTRIAXone (ROCEPHIN) 1 g in sodium chloride 0.9 % 100 mL IVPB  1 g Intravenous Q24H Bobette Mo, MD 200 mL/hr at 06/06/23 1049 1 g at 06/06/23 1049   cycloSPORINE (RESTASIS) 0.05 % ophthalmic emulsion 1 drop  1 drop Both Eyes BID Bobette Mo, MD   1 drop at 06/06/23 5409   diltiazem (CARDIZEM CD) 24 hr capsule 240 mg  240 mg Oral Daily Bobette Mo, MD   240  mg at 06/06/23 8119   escitalopram (LEXAPRO) tablet 20 mg  20 mg Oral Daily Bobette Mo, MD   20 mg at 06/06/23 1478   ezetimibe (ZETIA) tablet 10 mg  10 mg Oral Daily Bobette Mo, MD   10 mg at 06/06/23 0921   latanoprost (XALATAN) 0.005 % ophthalmic solution 1 drop  1 drop Both Eyes QHS Bobette Mo, MD   1 drop at 06/05/23 2232   losartan (COZAAR) tablet 25 mg  25 mg Oral Daily Bobette Mo, MD   25 mg at 06/06/23 2956   metoprolol tartrate (LOPRESSOR) tablet 50 mg  50 mg Oral BID Bobette Mo, MD   50 mg at 06/06/23 2130   mometasone-formoterol (DULERA) 200-5 MCG/ACT inhaler 2 puff  2 puff Inhalation BID Bobette Mo, MD   2 puff at 06/06/23 0731   nitroGLYCERIN (NITROSTAT) SL tablet 0.4 mg  0.4 mg Sublingual Q5 min PRN Bobette Mo, MD       ondansetron [facility]) tablet 4 mg  4 mg Oral Q6H PRN Bobette Mo, MD       Or   ondansetron [facility]) injection 4 mg  4 mg Intravenous Q6H PRN Bobette Mo, MD       pantoprazole (PROTONIX) EC tablet 40 mg  40 mg Oral Daily Bobette Mo, MD   40 mg at 06/06/23 8657   polyvinyl alcohol (LIQUIFILM TEARS) 1.4 % ophthalmic solution 1 drop  1 drop Both Eyes TID PRN Lynden Ang, RPH       sulfaSALAzine (AZULFIDINE) tablet 1,000 mg  1,000 mg Oral QID Bobette Mo, MD   1,000 mg at 06/06/23 8469     Discharge Medications: Please see discharge summary for a list of discharge medications.  Relevant Imaging Results:  Relevant Lab Results:   Additional Information SSN [id number]  Adrian Prows, RN

## 2023-06-06 NOTE — Plan of Care (Signed)
  Problem: Clinical Measurements: Goal: Ability to maintain clinical measurements within normal limits will improve Outcome: Progressing Goal: Diagnostic test results will improve Outcome: Progressing Goal: Respiratory complications will improve Outcome: Progressing   Problem: Safety: Goal: Ability to remain free from injury will improve Outcome: Progressing   

## 2023-06-07 DIAGNOSIS — R319 Hematuria, unspecified: Secondary | ICD-10-CM | POA: Diagnosis not present

## 2023-06-07 DIAGNOSIS — N390 Urinary tract infection, site not specified: Secondary | ICD-10-CM | POA: Diagnosis not present

## 2023-06-07 DIAGNOSIS — N3289 Other specified disorders of bladder: Secondary | ICD-10-CM | POA: Diagnosis not present

## 2023-06-07 DIAGNOSIS — I480 Paroxysmal atrial fibrillation: Secondary | ICD-10-CM | POA: Diagnosis not present

## 2023-06-07 LAB — COMPREHENSIVE METABOLIC PANEL
ALT: 23 U/L (ref 0–44)
AST: 32 U/L (ref 15–41)
Albumin: 2.7 g/dL — ABNORMAL LOW (ref 3.5–5.0)
Alkaline Phosphatase: 69 U/L (ref 38–126)
Anion gap: 7 (ref 5–15)
BUN: 43 mg/dL — ABNORMAL HIGH (ref 8–23)
CO2: 25 mmol/L (ref 22–32)
Calcium: 8.2 mg/dL — ABNORMAL LOW (ref 8.9–10.3)
Chloride: 103 mmol/L (ref 98–111)
Creatinine, Ser: 1.3 mg/dL — ABNORMAL HIGH (ref 0.61–1.24)
GFR, Estimated: 56 mL/min — ABNORMAL LOW (ref >60)
Glucose, Bld: 129 mg/dL — ABNORMAL HIGH (ref 70–99)
Potassium: 3.7 mmol/L (ref 3.5–5.1)
Sodium: 135 mmol/L (ref 135–145)
Total Bilirubin: 0.4 mg/dL (ref 0.3–1.2)
Total Protein: 5.4 g/dL — ABNORMAL LOW (ref 6.5–8.1)

## 2023-06-07 LAB — MAGNESIUM: Magnesium: 2.2 mg/dL (ref 1.7–2.4)

## 2023-06-07 LAB — CBC
HCT: 31.1 % — ABNORMAL LOW (ref 39.0–52.0)
Hemoglobin: 9.3 g/dL — ABNORMAL LOW (ref 13.0–17.0)
MCH: 28.2 pg (ref 26.0–34.0)
MCHC: 29.9 g/dL — ABNORMAL LOW (ref 30.0–36.0)
MCV: 94.2 fL (ref 80.0–100.0)
Platelets: 233 10*3/uL (ref 150–400)
RBC: 3.3 MIL/uL — ABNORMAL LOW (ref 4.22–5.81)
RDW: 17.2 % — ABNORMAL HIGH (ref 11.5–15.5)
WBC: 7.4 10*3/uL (ref 4.0–10.5)
nRBC: 0 % (ref 0.0–0.2)

## 2023-06-07 LAB — CALPROTECTIN, FECAL: Calprotectin, Fecal: 573 ug/g — ABNORMAL HIGH (ref 0–120)

## 2023-06-07 MED ORDER — SODIUM CHLORIDE 0.9 % IV SOLN: INTRAVENOUS | Status: AC

## 2023-06-07 NOTE — Telephone Encounter (Signed)
Called and spoke with Consuella Lose with [facility]. It was stated that BarryElliott is bed bound at this and we not be able to make it in for a appointment. Ms. Consuella Lose also stated she will be going on vacation so she will not be available to call back.  Dr. Sherene Sires please advise

## 2023-06-07 NOTE — Telephone Encounter (Signed)
Let Dr Annabell Howells know we can't do outpt clearance at this time - if pt can't be transported for this may be necessary to offer compassionate surgery s pulmonary clearance or admit a day early for w/u

## 2023-06-07 NOTE — TOC Progression Note (Addendum)
Transition of Care [facility]) - Progression Note    Patient Details  Name: Barry [name] MRN: [id number] Date of Birth: December 24, 1945  [facility] CM/SW Contact  Adrian Prows, RN Phone Number: 06/07/2023, 1:35 PM  Clinical Narrative:    Patient and friend Verner Mould given list of bed offers; awaiting choice.  -1510- pt selected bed at [facility]; [place] at facility notified; she says bed may not be available this weekend.   Expected Discharge Plan: Skilled Nursing Facility Barriers to Discharge: Continued Medical Work up  Expected Discharge Plan and Services   Discharge Planning Services: CM Consult   Living arrangements for the past 2 months: Apartment                                       Social Determinants of Health (SDOH) Interventions SDOH Screenings   Food Insecurity: No Food Insecurity (06/06/2023)  Housing: Low Risk  (06/06/2023)  Transportation Needs: No Transportation Needs (06/06/2023)  Utilities: Not At Risk (06/06/2023)  Tobacco Use: Low Risk  (06/04/2023)    Readmission Risk Interventions     No data to display

## 2023-06-07 NOTE — Progress Notes (Signed)
Progress Note    Barry [name]   ION:[id number]  DOB: 1946-02-26  DOA: 06/03/2023     4 PCP: Georgann Housekeeper, MD  Initial CC: fall at home, hematuria  Hospital Course: [name] is a 77 year old male with PMH A-fib, asthma, CAD, GERD, glaucoma, HLD, HTN, obesity, osteoarthritis, ulcerative colitis who presented with left knee pain/swelling after a fall at home and also reports of some hematuria.  There was also questionable concern for hematochezia on admission but after further collateral information, it appears most of his symptoms were related to hematuria.  Initially GI had been consulted in setting of hemoglobin drop and the suspected hematochezia but GI bleed was ruled out after hematuria was the more prominent culprit.  He was not felt to have any exacerbation of his ulcerative colitis either. CT abdomen/pelvis was obtained which showed multiple bladder lesions.  Urology was also consulted and plan is for outpatient follow-up for possible TURBT.   Interval History:  No events overnight.  Urine somewhat brown-tinged in basin but seems to be improving.  He has some ongoing knee pain but otherwise doing okay.  Assessment and Plan: * Complicated UTI (urinary tract infection) - UA noted with negative nitrite, negative LE, rare bacteria, greater than 50 WBC.  In setting of hematuria, he was started on antibiotics in case of underlying UTI as well -Continue Rocephin -Urine culture noted with <10k colonies - may just complete empiric course but ultimately suspect the hematuria is related to the lesions with possible contribution from Eliquis   Bladder mass - per CT A/P on 6/17: "Multiple mass lesions along the bladder lumen with some areas of dystrophic calcification near the margin of the right UVJ" -Has been evaluated by urology with tentative plans for outpatient follow-up on 06/28/2023 for TURBT  Hematuria -Risks and benefits discussed regarding Eliquis with patient and his friend  bedside.  We will reattempt initiation of Eliquis and monitoring for any worsening hematuria along with hemoglobin drop  PAF (paroxysmal atrial fibrillation) (HCC) - R/B discussed about stopping/resuming; for now he's okay with resuming and monitoring response of hematuria as well as watching Hgb   Physical deconditioning - his friend is concerned about his ability to return home independently - followup PT/OT evals: SNF recommended, may even need LTC  Effusion of left knee - s/p fall at home - xray shows degenerative changes and moderate joint effusion -No erythema mass, calor, exquisite tenderness; low suspicion for infection at this time - Continue knee immobilizer if helps and supportive care  Hx of CABG - Continue Eliquis, Lipitor, Lopressor -Losartan on hold for now  Hyperlipidemia LDL goal <70 - Continue ezetimibe  Benign essential HTN - Continue Lopressor -Hold Cardizem and losartan in setting of low pressure and worsened renal function  CAD (coronary artery disease), native coronary artery - Continue Eliquis, Lipitor, Lopressor  ULCERATIVE COLITIS - no s/s flare/exac at this time; evaluated by GI -Continue sulfasalazine  GASTROESOPHAGEAL REFLUX DISEASE - continue PPI  Asthma, chronic - No signs/symptoms of exacerbation - Continue as needed albuterol  Severe obesity (BMI >= 40) (HCC) - Complicates overall prognosis and care - Body mass index is 37.3 kg/m.   Old records reviewed in assessment of this patient  Antimicrobials: Rocephin 06/03/2023 >> 6/21  DVT prophylaxis:  SCDs Start: 06/03/23 1621 apixaban (ELIQUIS) tablet 5 mg   Code Status:   Code Status: Full Code  Mobility Assessment (last 72 hours)     Mobility Assessment     Row Name  06/06/23 1427 06/06/23 1222 06/06/23 0929 06/05/23 2230 06/05/23 1753   Does patient have an order for bedrest or is patient medically unstable -- -- No - Continue assessment Yes- Bedfast (Level 1) - Complete --    What is the highest level of mobility based on the progressive mobility assessment? Level 2 (Chairfast) - Balance while sitting on edge of bed and cannot stand Level 2 (Chairfast) - Balance while sitting on edge of bed and cannot stand Level 2 (Chairfast) - Balance while sitting on edge of bed and cannot stand Level 1 (Bedfast) - Unable to balance while sitting on edge of bed Level 1 (Bedfast) - Unable to balance while sitting on edge of bed   Is the above level different from baseline mobility prior to current illness? -- -- Yes - Recommend PT order Yes - Recommend PT order --    Row Name 06/05/23 0725 06/04/23 2330 06/04/23 1613       Does patient have an order for bedrest or is patient medically unstable No - Continue assessment Yes- Bedfast (Level 1) - Complete --     What is the highest level of mobility based on the progressive mobility assessment? Level 2 (Chairfast) - Balance while sitting on edge of bed and cannot stand Level 1 (Bedfast) - Unable to balance while sitting on edge of bed Level 1 (Bedfast) - Unable to balance while sitting on edge of bed  knee is huring and is unable to get out of bed this afternoon     Is the above level different from baseline mobility prior to current illness? Yes - Recommend PT order Yes - Recommend PT order --              Barriers to discharge: none Disposition Plan:  SNF Status is: Inpt  Objective: Blood pressure 127/69, pulse 97, temperature 98.4 F (36.9 C), temperature source Oral, resp. rate 20, height 5\' 3"  (1.6 m), weight 95.5 kg, SpO2 92 %.  Examination:  Physical Exam Constitutional:      General: He is not in acute distress.    Appearance: He is not ill-appearing.  HENT:     Head: Normocephalic and atraumatic.     Mouth/Throat:     Mouth: Mucous membranes are moist.  Eyes:     Extraocular Movements: Extraocular movements intact.  Cardiovascular:     Rate and Rhythm: Normal rate. Rhythm irregular.  Pulmonary:     Effort:  Pulmonary effort is normal.     Breath sounds: Normal breath sounds.  Abdominal:     General: Bowel sounds are normal. There is no distension.     Palpations: Abdomen is soft.     Tenderness: There is no abdominal tenderness.  Musculoskeletal:     Cervical back: Normal range of motion and neck supple.     Comments: L knee swelling noted but no erythema, tenderness out of proportion, nor calor  Skin:    General: Skin is warm and dry.  Neurological:     General: No focal deficit present.     Mental Status: He is alert.  Psychiatric:        Mood and Affect: Mood normal.      Consultants:  GI Cardiology Urology   Procedures:    Data Reviewed: Results for orders placed or performed during the hospital encounter of 06/03/23 (from the past 24 hour(s))  CBC     Status: Abnormal   Collection Time: 06/07/23  5:21 AM  Result Value Ref  Range   WBC 7.4 4.0 - 10.5 K/uL   RBC 3.30 (L) 4.22 - 5.81 MIL/uL   Hemoglobin 9.3 (L) 13.0 - 17.0 g/dL   HCT 16.1 (L) 09.6 - 04.5 %   MCV 94.2 80.0 - 100.0 fL   MCH 28.2 26.0 - 34.0 pg   MCHC 29.9 (L) 30.0 - 36.0 g/dL   RDW 40.9 (H) 81.1 - 91.4 %   Platelets 233 150 - 400 K/uL   nRBC 0.0 0.0 - 0.2 %  Comprehensive metabolic panel     Status: Abnormal   Collection Time: 06/07/23  5:21 AM  Result Value Ref Range   Sodium 135 135 - 145 mmol/L   Potassium 3.7 3.5 - 5.1 mmol/L   Chloride 103 98 - 111 mmol/L   CO2 25 22 - 32 mmol/L   Glucose, Bld 129 (H) 70 - 99 mg/dL   BUN 43 (H) 8 - 23 mg/dL   Creatinine, Ser 7.82 (H) 0.61 - 1.24 mg/dL   Calcium 8.2 (L) 8.9 - 10.3 mg/dL   Total Protein 5.4 (L) 6.5 - 8.1 g/dL   Albumin 2.7 (L) 3.5 - 5.0 g/dL   AST 32 15 - 41 U/L   ALT 23 0 - 44 U/L   Alkaline Phosphatase 69 38 - 126 U/L   Total Bilirubin 0.4 0.3 - 1.2 mg/dL   GFR, Estimated 56 (L) >60 mL/min   Anion gap 7 5 - 15  Magnesium     Status: None   Collection Time: 06/07/23  5:21 AM  Result Value Ref Range   Magnesium 2.2 1.7 - 2.4 mg/dL    I  have reviewed pertinent nursing notes, vitals, labs, and images as necessary. I have ordered labwork to follow up on as indicated.  I have reviewed the last notes from staff over past 24 hours. I have discussed patient's care plan and test results with nursing staff, CM/SW, and other staff as appropriate.  Time spent: Greater than 50% of the 55 minute visit was spent in counseling/coordination of care for the patient as laid out in the A&P.   LOS: 4 days   Lewie Chamber, MD [facility] 06/07/2023, 3:19 PM

## 2023-06-07 NOTE — Care Management Important Message (Signed)
Important Message  Patient Details IM Letter given. Name: KASHIS PENLEY MRN: [id number] Date of Birth: [birth year]   Medicare Important Message Given:  Yes     Caren Macadam 06/07/2023, 2:52 PM

## 2023-06-08 DIAGNOSIS — I480 Paroxysmal atrial fibrillation: Secondary | ICD-10-CM | POA: Diagnosis not present

## 2023-06-08 DIAGNOSIS — N3289 Other specified disorders of bladder: Secondary | ICD-10-CM | POA: Diagnosis not present

## 2023-06-08 DIAGNOSIS — N390 Urinary tract infection, site not specified: Secondary | ICD-10-CM | POA: Diagnosis not present

## 2023-06-08 DIAGNOSIS — R319 Hematuria, unspecified: Secondary | ICD-10-CM | POA: Diagnosis not present

## 2023-06-08 LAB — BASIC METABOLIC PANEL
Anion gap: 10 (ref 5–15)
BUN: 37 mg/dL — ABNORMAL HIGH (ref 8–23)
CO2: 20 mmol/L — ABNORMAL LOW (ref 22–32)
Calcium: 8.4 mg/dL — ABNORMAL LOW (ref 8.9–10.3)
Chloride: 104 mmol/L (ref 98–111)
Creatinine, Ser: 0.8 mg/dL (ref 0.61–1.24)
GFR, Estimated: >60 mL/min (ref >60)
Glucose, Bld: 113 mg/dL — ABNORMAL HIGH (ref 70–99)
Potassium: 3.7 mmol/L (ref 3.5–5.1)
Sodium: 134 mmol/L — ABNORMAL LOW (ref 135–145)

## 2023-06-08 LAB — CBC
HCT: 32 % — ABNORMAL LOW (ref 39.0–52.0)
Hemoglobin: 9.2 g/dL — ABNORMAL LOW (ref 13.0–17.0)
MCH: 27.6 pg (ref 26.0–34.0)
MCHC: 28.8 g/dL — ABNORMAL LOW (ref 30.0–36.0)
MCV: 96.1 fL (ref 80.0–100.0)
Platelets: 219 10*3/uL (ref 150–400)
RBC: 3.33 MIL/uL — ABNORMAL LOW (ref 4.22–5.81)
RDW: 17.1 % — ABNORMAL HIGH (ref 11.5–15.5)
WBC: 6.5 10*3/uL (ref 4.0–10.5)
nRBC: 0 % (ref 0.0–0.2)

## 2023-06-08 LAB — MAGNESIUM: Magnesium: 2.4 mg/dL (ref 1.7–2.4)

## 2023-06-08 NOTE — TOC Progression Note (Signed)
Transition of Care [facility]) - Progression Note    Patient Details  Name: Barry [name] MRN: [id number] Date of Birth: January 18, 1946  Transition of [facility]) CM/SW Contact  Adrian Prows, RN Phone Number: 06/08/2023, 12:09 PM  Clinical Narrative:    Rushie Nyhan at [facility]; she says no bed available b/c pt that was to d/c cannot leave until Monday.   Expected Discharge Plan: Skilled Nursing Facility Barriers to Discharge: Continued Medical Work up  Expected Discharge Plan and Services   Discharge Planning Services: CM Consult   Living arrangements for the past 2 months: Apartment                                       Social Determinants of Health (SDOH) Interventions SDOH Screenings   Food Insecurity: No Food Insecurity (06/06/2023)  Housing: Low Risk  (06/06/2023)  Transportation Needs: No Transportation Needs (06/06/2023)  Utilities: Not At Risk (06/06/2023)  Tobacco Use: Low Risk  (06/04/2023)    Readmission Risk Interventions     No data to display

## 2023-06-08 NOTE — Progress Notes (Signed)
Progress Note    Barry [name]   UJW:[id number]  DOB: Apr 25, 1946  DOA: 06/03/2023     5 PCP: Georgann Housekeeper, MD  Initial CC: fall at home, hematuria  Hospital Course: Barry [name] is a 77 year old male with PMH A-fib, asthma, CAD, GERD, glaucoma, HLD, HTN, obesity, osteoarthritis, ulcerative colitis who presented with left knee pain/swelling after a fall at home and also reports of some hematuria.  There was also questionable concern for hematochezia on admission but after further collateral information, it appears most of his symptoms were related to hematuria.  Initially GI had been consulted in setting of hemoglobin drop and the suspected hematochezia but GI bleed was ruled out after hematuria was the more prominent culprit.  He was not felt to have any exacerbation of his ulcerative colitis either. CT abdomen/pelvis was obtained which showed multiple bladder lesions.  Urology was also consulted and plan is for outpatient follow-up for possible TURBT.   Interval History:  No events overnight.  Urine color remains stable, orange tinted. Medically he remains stable and can discharge to [facility] once bed available.  Assessment and Plan: * Complicated UTI (urinary tract infection)-resolved as of 06/08/2023 - UA noted with negative nitrite, negative LE, rare bacteria, greater than 50 WBC.  In setting of hematuria, he was started on antibiotics in case of underlying UTI as well -Urine culture noted with <10k colonies - may just complete empiric course but ultimately suspect the hematuria is related to the lesions with possible contribution from Eliquis  -Rocephin course completed during hospitalization  Bladder mass - per CT A/P on 6/17: "Multiple mass lesions along the bladder lumen with some areas of dystrophic calcification near the margin of the right UVJ" -Has been evaluated by urology with tentative plans for outpatient follow-up on 06/28/2023 for TURBT  Hematuria -Risks and benefits  discussed regarding Eliquis with patient and his friend, Consuella Lose, bedside.  We will reattempt initiation of Eliquis and monitoring for any worsening hematuria along with hemoglobin drop -So far continues to have no further significant hematuria.  Will continue trending hemoglobin however  PAF (paroxysmal atrial fibrillation) (HCC) - R/B discussed about stopping/resuming; for now he's okay with resuming and monitoring response of hematuria as well as watching Hgb   Physical deconditioning - his friend is concerned about his ability to return home independently - followup PT/OT evals: SNF recommended, may even need LTC  Effusion of left knee - s/p fall at home - xray shows degenerative changes and moderate joint effusion -No erythema mass, calor, exquisite tenderness; low suspicion for infection at this time - Continue knee immobilizer if helps and supportive care  Hx of CABG - Continue Eliquis, Lipitor, Lopressor -Losartan on hold for now  Hyperlipidemia LDL goal <70 - Continue ezetimibe  Benign essential HTN - Continue Lopressor -Hold Cardizem and losartan in setting of low pressure and worsened renal function  CAD (coronary artery disease), native coronary artery - Continue Eliquis, Lipitor, Lopressor  ULCERATIVE COLITIS - no s/s flare/exac at this time; evaluated by GI -Continue sulfasalazine  GASTROESOPHAGEAL REFLUX DISEASE - continue PPI  Asthma, chronic - No signs/symptoms of exacerbation - Continue as needed albuterol  Severe obesity (BMI >= 40) (HCC) - Complicates overall prognosis and care - Body mass index is 37.3 kg/m.   Old records reviewed in assessment of this patient  Antimicrobials: Rocephin 06/03/2023 >> 6/21  DVT prophylaxis:  SCDs Start: 06/03/23 1621 apixaban (ELIQUIS) tablet 5 mg   Code Status:   Code Status:  Full Code  Mobility Assessment (last 72 hours)     Mobility Assessment     Row Name 06/08/23 0830 06/07/23 1022 06/06/23 1427  06/06/23 1222 06/06/23 0929   Does patient have an order for bedrest or is patient medically unstable No - Continue assessment No - Continue assessment -- -- No - Continue assessment   What is the highest level of mobility based on the progressive mobility assessment? Level 2 (Chairfast) - Balance while sitting on edge of bed and cannot stand Level 1 (Bedfast) - Unable to balance while sitting on edge of bed Level 2 (Chairfast) - Balance while sitting on edge of bed and cannot stand Level 2 (Chairfast) - Balance while sitting on edge of bed and cannot stand Level 2 (Chairfast) - Balance while sitting on edge of bed and cannot stand   Is the above level different from baseline mobility prior to current illness? Yes - Recommend PT order Yes - Recommend PT order -- -- Yes - Recommend PT order    Row Name 06/05/23 2230 06/05/23 1753         Does patient have an order for bedrest or is patient medically unstable Yes- Bedfast (Level 1) - Complete --      What is the highest level of mobility based on the progressive mobility assessment? Level 1 (Bedfast) - Unable to balance while sitting on edge of bed Level 1 (Bedfast) - Unable to balance while sitting on edge of bed      Is the above level different from baseline mobility prior to current illness? Yes - Recommend PT order --               Barriers to discharge: none Disposition Plan:  SNF Status is: Inpt  Objective: Blood pressure 128/68, pulse 85, temperature 98.5 F (36.9 C), temperature source Oral, resp. rate 18, height 5\' 3"  (1.6 m), weight 95.5 kg, SpO2 96 %.  Examination:  Physical Exam Constitutional:      General: He is not in acute distress.    Appearance: He is not ill-appearing.  HENT:     Head: Normocephalic and atraumatic.     Mouth/Throat:     Mouth: Mucous membranes are moist.  Eyes:     Extraocular Movements: Extraocular movements intact.  Cardiovascular:     Rate and Rhythm: Normal rate. Rhythm irregular.   Pulmonary:     Effort: Pulmonary effort is normal.     Breath sounds: Normal breath sounds.  Abdominal:     General: Bowel sounds are normal. There is no distension.     Palpations: Abdomen is soft.     Tenderness: There is no abdominal tenderness.  Musculoskeletal:     Cervical back: Normal range of motion and neck supple.     Comments: L knee swelling noted but no erythema, tenderness out of proportion, nor calor  Skin:    General: Skin is warm and dry.  Neurological:     General: No focal deficit present.     Mental Status: He is alert.  Psychiatric:        Mood and Affect: Mood normal.      Consultants:  GI Cardiology Urology   Procedures:    Data Reviewed: Results for orders placed or performed during the hospital encounter of 06/03/23 (from the past 24 hour(s))  CBC     Status: Abnormal   Collection Time: 06/08/23  5:31 AM  Result Value Ref Range   WBC 6.5 4.0 - 10.5  K/uL   RBC 3.33 (L) 4.22 - 5.81 MIL/uL   Hemoglobin 9.2 (L) 13.0 - 17.0 g/dL   HCT 40.9 (L) 81.1 - 91.4 %   MCV 96.1 80.0 - 100.0 fL   MCH 27.6 26.0 - 34.0 pg   MCHC 28.8 (L) 30.0 - 36.0 g/dL   RDW 78.2 (H) 95.6 - 21.3 %   Platelets 219 150 - 400 K/uL   nRBC 0.0 0.0 - 0.2 %  Magnesium     Status: None   Collection Time: 06/08/23  5:31 AM  Result Value Ref Range   Magnesium 2.4 1.7 - 2.4 mg/dL  Basic metabolic panel     Status: Abnormal   Collection Time: 06/08/23  5:31 AM  Result Value Ref Range   Sodium 134 (L) 135 - 145 mmol/L   Potassium 3.7 3.5 - 5.1 mmol/L   Chloride 104 98 - 111 mmol/L   CO2 20 (L) 22 - 32 mmol/L   Glucose, Bld 113 (H) 70 - 99 mg/dL   BUN 37 (H) 8 - 23 mg/dL   Creatinine, Ser 0.86 0.61 - 1.24 mg/dL   Calcium 8.4 (L) 8.9 - 10.3 mg/dL   GFR, Estimated >57 >84 mL/min   Anion gap 10 5 - 15    I have reviewed pertinent nursing notes, vitals, labs, and images as necessary. I have ordered labwork to follow up on as indicated.  I have reviewed the last notes from staff  over past 24 hours. I have discussed patient's care plan and test results with nursing staff, CM/SW, and other staff as appropriate.  Time spent: Greater than 50% of the 55 minute visit was spent in counseling/coordination of care for the patient as laid out in the A&P.   LOS: 5 days   Lewie Chamber, MD [facility] 06/08/2023, 2:58 PM

## 2023-06-09 DIAGNOSIS — N390 Urinary tract infection, site not specified: Secondary | ICD-10-CM | POA: Diagnosis not present

## 2023-06-09 DIAGNOSIS — N3289 Other specified disorders of bladder: Secondary | ICD-10-CM | POA: Diagnosis not present

## 2023-06-09 DIAGNOSIS — R319 Hematuria, unspecified: Secondary | ICD-10-CM | POA: Diagnosis not present

## 2023-06-09 DIAGNOSIS — I480 Paroxysmal atrial fibrillation: Secondary | ICD-10-CM | POA: Diagnosis not present

## 2023-06-09 LAB — BASIC METABOLIC PANEL
Anion gap: 8 (ref 5–15)
BUN: 34 mg/dL — ABNORMAL HIGH (ref 8–23)
CO2: 25 mmol/L (ref 22–32)
Calcium: 8.3 mg/dL — ABNORMAL LOW (ref 8.9–10.3)
Chloride: 101 mmol/L (ref 98–111)
Creatinine, Ser: 0.87 mg/dL (ref 0.61–1.24)
GFR, Estimated: >60 mL/min (ref >60)
Glucose, Bld: 109 mg/dL — ABNORMAL HIGH (ref 70–99)
Potassium: 4.1 mmol/L (ref 3.5–5.1)
Sodium: 134 mmol/L — ABNORMAL LOW (ref 135–145)

## 2023-06-09 LAB — CBC WITH DIFFERENTIAL/PLATELET
Abs Immature Granulocytes: 0.02 10*3/uL (ref 0.00–0.07)
Basophils Absolute: 0 10*3/uL (ref 0.0–0.1)
Basophils Relative: 1 %
Eosinophils Absolute: 0.1 10*3/uL (ref 0.0–0.5)
Eosinophils Relative: 1 %
HCT: 31 % — ABNORMAL LOW (ref 39.0–52.0)
Hemoglobin: 9.4 g/dL — ABNORMAL LOW (ref 13.0–17.0)
Immature Granulocytes: 0 %
Lymphocytes Relative: 11 %
Lymphs Abs: 0.6 10*3/uL — ABNORMAL LOW (ref 0.7–4.0)
MCH: 28.2 pg (ref 26.0–34.0)
MCHC: 30.3 g/dL (ref 30.0–36.0)
MCV: 93.1 fL (ref 80.0–100.0)
Monocytes Absolute: 0.6 10*3/uL (ref 0.1–1.0)
Monocytes Relative: 11 %
Neutro Abs: 4.1 10*3/uL (ref 1.7–7.7)
Neutrophils Relative %: 76 %
Platelets: 231 10*3/uL (ref 150–400)
RBC: 3.33 MIL/uL — ABNORMAL LOW (ref 4.22–5.81)
RDW: 16.7 % — ABNORMAL HIGH (ref 11.5–15.5)
WBC: 5.5 10*3/uL (ref 4.0–10.5)
nRBC: 0 % (ref 0.0–0.2)

## 2023-06-09 LAB — MAGNESIUM: Magnesium: 2.2 mg/dL (ref 1.7–2.4)

## 2023-06-09 NOTE — Progress Notes (Signed)
Progress Note    MUHAMMED TEUTSCH   UJW:[id number]  DOB: 1945-12-22  DOA: 06/03/2023     6 PCP: Georgann Housekeeper, MD  Initial CC: fall at home, hematuria  Hospital Course: Mr. Radigan is a 77 year old male with PMH A-fib, asthma, CAD, GERD, glaucoma, HLD, HTN, obesity, osteoarthritis, ulcerative colitis who presented with left knee pain/swelling after a fall at home and also reports of some hematuria.  There was also questionable concern for hematochezia on admission but after further collateral information, it appears most of his symptoms were related to hematuria.  Initially GI had been consulted in setting of hemoglobin drop and the suspected hematochezia but GI bleed was ruled out after hematuria was the more prominent culprit.  He was not felt to have any exacerbation of his ulcerative colitis either. CT abdomen/pelvis was obtained which showed multiple bladder lesions.  Urology was also consulted and plan is for outpatient follow-up for possible TURBT.   Interval History:  No events overnight.  Urine and Hgb stable. Remains medically stable for [facility] hopefully Monday.   Assessment and Plan: * Complicated UTI (urinary tract infection)-resolved as of 06/08/2023 - UA noted with negative nitrite, negative LE, rare bacteria, greater than 50 WBC.  In setting of hematuria, he was started on antibiotics in case of underlying UTI as well -Urine culture noted with <10k colonies - may just complete empiric course but ultimately suspect the hematuria is related to the lesions with possible contribution from Eliquis  -Rocephin course completed during hospitalization  Bladder mass - per CT A/P on 6/17: "Multiple mass lesions along the bladder lumen with some areas of dystrophic calcification near the margin of the right UVJ" -Has been evaluated by urology with tentative plans for outpatient follow-up on 06/28/2023 for TURBT  Hematuria-resolved as of 06/09/2023 -Risks and benefits discussed  regarding Eliquis with patient and his friend, Consuella Lose, bedside.  We will reattempt initiation of Eliquis and monitoring for any worsening hematuria along with hemoglobin drop -So far continues to have no further significant hematuria and hemoglobin remains stable  PAF (paroxysmal atrial fibrillation) (HCC) - R/B discussed about stopping/resuming; for now he's okay with resuming and monitoring response of hematuria as well as watching Hgb  - Hgb remaining stable   Physical deconditioning - Consuella Lose is concerned about his ability to return home independently - followup PT/OT evals: SNF recommended, may even need LTC  Effusion of left knee - s/p fall at home - xray shows degenerative changes and moderate joint effusion -No erythema mass, calor, exquisite tenderness; low suspicion for infection at this time - Continue knee immobilizer if helps and supportive care  Hx of CABG - Continue Eliquis, Lipitor, Lopressor -Losartan on hold for now  Hyperlipidemia LDL goal <70 - Continue ezetimibe  Benign essential HTN - Continue Lopressor -Hold Cardizem and losartan in setting of low pressure and worsened renal function  CAD (coronary artery disease), native coronary artery - Continue Eliquis, Lipitor, Lopressor  ULCERATIVE COLITIS - no s/s flare/exac at this time; evaluated by GI -Continue sulfasalazine  GASTROESOPHAGEAL REFLUX DISEASE - continue PPI  Asthma, chronic - No signs/symptoms of exacerbation - Continue as needed albuterol  Severe obesity (BMI >= 40) (HCC) - Complicates overall prognosis and care - Body mass index is 37.3 kg/m.   Old records reviewed in assessment of this patient  Antimicrobials: Rocephin 06/03/2023 >> 6/21  DVT prophylaxis:  SCDs Start: 06/03/23 1621 apixaban (ELIQUIS) tablet 5 mg   Code Status:   Code Status: Full  Code  Mobility Assessment (last 72 hours)     Mobility Assessment     Row Name 06/09/23 0820 06/08/23 0830 06/07/23 1022  06/06/23 1427     Does patient have an order for bedrest or is patient medically unstable No - Continue assessment No - Continue assessment No - Continue assessment --    What is the highest level of mobility based on the progressive mobility assessment? Level 1 (Bedfast) - Unable to balance while sitting on edge of bed Level 1 (Bedfast) - Unable to balance while sitting on edge of bed Level 1 (Bedfast) - Unable to balance while sitting on edge of bed Level 2 (Chairfast) - Balance while sitting on edge of bed and cannot stand    Is the above level different from baseline mobility prior to current illness? Yes - Recommend PT order Yes - Recommend PT order Yes - Recommend PT order --             Barriers to discharge: SNF cannot accept over weekend Disposition Plan:  SNF Status is: Inpt  Objective: Blood pressure 130/73, pulse 88, temperature 98.3 F (36.8 C), temperature source Oral, resp. rate 19, height 5\' 3"  (1.6 m), weight 95.5 kg, SpO2 94 %.  Examination:  Physical Exam Constitutional:      General: He is not in acute distress.    Appearance: He is not ill-appearing.  HENT:     Head: Normocephalic and atraumatic.     Mouth/Throat:     Mouth: Mucous membranes are moist.  Eyes:     Extraocular Movements: Extraocular movements intact.  Cardiovascular:     Rate and Rhythm: Normal rate. Rhythm irregular.  Pulmonary:     Effort: Pulmonary effort is normal.     Breath sounds: Normal breath sounds.  Abdominal:     General: Bowel sounds are normal. There is no distension.     Palpations: Abdomen is soft.     Tenderness: There is no abdominal tenderness.  Musculoskeletal:     Cervical back: Normal range of motion and neck supple.     Comments: L knee swelling noted but no erythema, tenderness out of proportion, nor calor  Skin:    General: Skin is warm and dry.  Neurological:     General: No focal deficit present.     Mental Status: He is alert.  Psychiatric:        Mood and  Affect: Mood normal.      Consultants:  GI Cardiology Urology   Procedures:    Data Reviewed: Results for orders placed or performed during the hospital encounter of 06/03/23 (from the past 24 hour(s))  Magnesium     Status: None   Collection Time: 06/09/23  5:37 AM  Result Value Ref Range   Magnesium 2.2 1.7 - 2.4 mg/dL  Basic metabolic panel     Status: Abnormal   Collection Time: 06/09/23  5:37 AM  Result Value Ref Range   Sodium 134 (L) 135 - 145 mmol/L   Potassium 4.1 3.5 - 5.1 mmol/L   Chloride 101 98 - 111 mmol/L   CO2 25 22 - 32 mmol/L   Glucose, Bld 109 (H) 70 - 99 mg/dL   BUN 34 (H) 8 - 23 mg/dL   Creatinine, Ser 7.82 0.61 - 1.24 mg/dL   Calcium 8.3 (L) 8.9 - 10.3 mg/dL   GFR, Estimated >95 >62 mL/min   Anion gap 8 5 - 15  CBC with Differential/Platelet     Status:  Abnormal   Collection Time: 06/09/23  5:37 AM  Result Value Ref Range   WBC 5.5 4.0 - 10.5 K/uL   RBC 3.33 (L) 4.22 - 5.81 MIL/uL   Hemoglobin 9.4 (L) 13.0 - 17.0 g/dL   HCT 69.6 (L) 78.9 - 38.1 %   MCV 93.1 80.0 - 100.0 fL   MCH 28.2 26.0 - 34.0 pg   MCHC 30.3 30.0 - 36.0 g/dL   RDW 01.7 (H) 51.0 - 25.8 %   Platelets 231 150 - 400 K/uL   nRBC 0.0 0.0 - 0.2 %   Neutrophils Relative % 76 %   Neutro Abs 4.1 1.7 - 7.7 K/uL   Lymphocytes Relative 11 %   Lymphs Abs 0.6 (L) 0.7 - 4.0 K/uL   Monocytes Relative 11 %   Monocytes Absolute 0.6 0.1 - 1.0 K/uL   Eosinophils Relative 1 %   Eosinophils Absolute 0.1 0.0 - 0.5 K/uL   Basophils Relative 1 %   Basophils Absolute 0.0 0.0 - 0.1 K/uL   Immature Granulocytes 0 %   Abs Immature Granulocytes 0.02 0.00 - 0.07 K/uL    I have reviewed pertinent nursing notes, vitals, labs, and images as necessary. I have ordered labwork to follow up on as indicated.  I have reviewed the last notes from staff over past 24 hours. I have discussed patient's care plan and test results with nursing staff, CM/SW, and other staff as appropriate.    LOS: 6 days    Lewie Chamber, MD [facility] 06/09/2023, 1:06 PM

## 2023-06-10 DIAGNOSIS — M25462 Effusion, left knee: Secondary | ICD-10-CM | POA: Diagnosis not present

## 2023-06-10 DIAGNOSIS — C679 Malignant neoplasm of bladder, unspecified: Secondary | ICD-10-CM | POA: Diagnosis not present

## 2023-06-10 DIAGNOSIS — J4550 Severe persistent asthma, uncomplicated: Secondary | ICD-10-CM | POA: Diagnosis not present

## 2023-06-10 DIAGNOSIS — Z7901 Long term (current) use of anticoagulants: Secondary | ICD-10-CM | POA: Diagnosis not present

## 2023-06-10 DIAGNOSIS — I2510 Atherosclerotic heart disease of native coronary artery without angina pectoris: Secondary | ICD-10-CM | POA: Diagnosis not present

## 2023-06-10 DIAGNOSIS — N3289 Other specified disorders of bladder: Secondary | ICD-10-CM | POA: Diagnosis not present

## 2023-06-10 DIAGNOSIS — R2689 Other abnormalities of gait and mobility: Secondary | ICD-10-CM | POA: Diagnosis not present

## 2023-06-10 DIAGNOSIS — C680 Malignant neoplasm of urethra: Secondary | ICD-10-CM | POA: Diagnosis not present

## 2023-06-10 DIAGNOSIS — I4821 Permanent atrial fibrillation: Secondary | ICD-10-CM | POA: Diagnosis not present

## 2023-06-10 DIAGNOSIS — Z01810 Encounter for preprocedural cardiovascular examination: Secondary | ICD-10-CM | POA: Diagnosis not present

## 2023-06-10 DIAGNOSIS — D649 Anemia, unspecified: Secondary | ICD-10-CM | POA: Diagnosis not present

## 2023-06-10 DIAGNOSIS — R310 Gross hematuria: Secondary | ICD-10-CM | POA: Diagnosis not present

## 2023-06-10 DIAGNOSIS — E669 Obesity, unspecified: Secondary | ICD-10-CM | POA: Diagnosis present

## 2023-06-10 DIAGNOSIS — R319 Hematuria, unspecified: Secondary | ICD-10-CM | POA: Diagnosis not present

## 2023-06-10 DIAGNOSIS — J45909 Unspecified asthma, uncomplicated: Secondary | ICD-10-CM | POA: Diagnosis not present

## 2023-06-10 DIAGNOSIS — N390 Urinary tract infection, site not specified: Secondary | ICD-10-CM | POA: Diagnosis not present

## 2023-06-10 DIAGNOSIS — Z841 Family history of disorders of kidney and ureter: Secondary | ICD-10-CM | POA: Diagnosis not present

## 2023-06-10 DIAGNOSIS — E785 Hyperlipidemia, unspecified: Secondary | ICD-10-CM | POA: Diagnosis not present

## 2023-06-10 DIAGNOSIS — D494 Neoplasm of unspecified behavior of bladder: Secondary | ICD-10-CM | POA: Diagnosis not present

## 2023-06-10 DIAGNOSIS — Z7401 Bed confinement status: Secondary | ICD-10-CM | POA: Diagnosis not present

## 2023-06-10 DIAGNOSIS — Z888 Allergy status to other drugs, medicaments and biological substances status: Secondary | ICD-10-CM | POA: Diagnosis not present

## 2023-06-10 DIAGNOSIS — Z9181 History of falling: Secondary | ICD-10-CM | POA: Diagnosis not present

## 2023-06-10 DIAGNOSIS — K5190 Ulcerative colitis, unspecified, without complications: Secondary | ICD-10-CM | POA: Diagnosis not present

## 2023-06-10 DIAGNOSIS — C678 Malignant neoplasm of overlapping sites of bladder: Secondary | ICD-10-CM | POA: Diagnosis not present

## 2023-06-10 DIAGNOSIS — Z79899 Other long term (current) drug therapy: Secondary | ICD-10-CM | POA: Diagnosis not present

## 2023-06-10 DIAGNOSIS — Z8711 Personal history of peptic ulcer disease: Secondary | ICD-10-CM | POA: Diagnosis not present

## 2023-06-10 DIAGNOSIS — M6281 Muscle weakness (generalized): Secondary | ICD-10-CM | POA: Diagnosis not present

## 2023-06-10 DIAGNOSIS — Z6835 Body mass index (BMI) 35.0-35.9, adult: Secondary | ICD-10-CM | POA: Diagnosis not present

## 2023-06-10 DIAGNOSIS — Z7951 Long term (current) use of inhaled steroids: Secondary | ICD-10-CM | POA: Diagnosis not present

## 2023-06-10 DIAGNOSIS — R41841 Cognitive communication deficit: Secondary | ICD-10-CM | POA: Diagnosis not present

## 2023-06-10 DIAGNOSIS — D62 Acute posthemorrhagic anemia: Secondary | ICD-10-CM | POA: Diagnosis not present

## 2023-06-10 DIAGNOSIS — Z8249 Family history of ischemic heart disease and other diseases of the circulatory system: Secondary | ICD-10-CM | POA: Diagnosis not present

## 2023-06-10 DIAGNOSIS — R29898 Other symptoms and signs involving the musculoskeletal system: Secondary | ICD-10-CM | POA: Diagnosis not present

## 2023-06-10 DIAGNOSIS — H409 Unspecified glaucoma: Secondary | ICD-10-CM | POA: Diagnosis present

## 2023-06-10 DIAGNOSIS — Z483 Aftercare following surgery for neoplasm: Secondary | ICD-10-CM | POA: Diagnosis not present

## 2023-06-10 DIAGNOSIS — R58 Hemorrhage, not elsewhere classified: Secondary | ICD-10-CM | POA: Diagnosis not present

## 2023-06-10 DIAGNOSIS — K922 Gastrointestinal hemorrhage, unspecified: Secondary | ICD-10-CM | POA: Diagnosis not present

## 2023-06-10 DIAGNOSIS — Z951 Presence of aortocoronary bypass graft: Secondary | ICD-10-CM | POA: Diagnosis not present

## 2023-06-10 DIAGNOSIS — K219 Gastro-esophageal reflux disease without esophagitis: Secondary | ICD-10-CM | POA: Diagnosis present

## 2023-06-10 DIAGNOSIS — M1990 Unspecified osteoarthritis, unspecified site: Secondary | ICD-10-CM | POA: Diagnosis not present

## 2023-06-10 DIAGNOSIS — F05 Delirium due to known physiological condition: Secondary | ICD-10-CM | POA: Diagnosis not present

## 2023-06-10 DIAGNOSIS — R1312 Dysphagia, oropharyngeal phase: Secondary | ICD-10-CM | POA: Diagnosis not present

## 2023-06-10 DIAGNOSIS — N329 Bladder disorder, unspecified: Secondary | ICD-10-CM | POA: Diagnosis not present

## 2023-06-10 DIAGNOSIS — I10 Essential (primary) hypertension: Secondary | ICD-10-CM | POA: Diagnosis not present

## 2023-06-10 DIAGNOSIS — I4891 Unspecified atrial fibrillation: Secondary | ICD-10-CM | POA: Diagnosis not present

## 2023-06-10 DIAGNOSIS — I480 Paroxysmal atrial fibrillation: Secondary | ICD-10-CM | POA: Diagnosis not present

## 2023-06-10 DIAGNOSIS — R2681 Unsteadiness on feet: Secondary | ICD-10-CM | POA: Diagnosis not present

## 2023-06-10 DIAGNOSIS — D4959 Neoplasm of unspecified behavior of other genitourinary organ: Secondary | ICD-10-CM | POA: Diagnosis not present

## 2023-06-10 DIAGNOSIS — N200 Calculus of kidney: Secondary | ICD-10-CM | POA: Diagnosis present

## 2023-06-10 DIAGNOSIS — Z66 Do not resuscitate: Secondary | ICD-10-CM | POA: Diagnosis not present

## 2023-06-10 MED ORDER — ACETAMINOPHEN 325 MG PO TABS: 650.0000 mg | ORAL_TABLET | ORAL | Status: DC | PRN

## 2023-06-10 NOTE — Plan of Care (Signed)

## 2023-06-10 NOTE — TOC Transition Note (Addendum)
Transition of Care [facility]) - CM/SW Discharge Note   Patient Details  Name: Barry [name] MRN: [id number] Date of Birth: May 02, 1946  [facility]) CM/SW Contact:  Howell Rucks, RN Phone Number: 06/10/2023, 11:30 AM   Clinical Narrative:  NCM call to Davy Pique (friend) to inform of dc to [facility], per Consuella Lose she is out of town until the end of week, therefore unable to complete admission paperwork. NCM call to [place] at [facility] to discuss options,  Lowella Bandy inquired if pt able to complete his own paperwork. NCM met with pt bedside with Lowella Bandy on speaker phone, Lowella Bandy explained paperwork needed for admission, pt confirmed he feels he is able to complete the paperwork.   -12:54pm [facility] called to transport. No further TOC needs identified.    Final next level of care: Skilled Nursing Facility Barriers to Discharge: Barriers Resolved   Patient Goals and CMS Choice CMS Medicare.gov Compare Post Acute Care list provided to:: Patient Represenative (must comment) Barry [name] (friend)) Choice offered to / list presented to : [facility] POA / Guardian Barry [name])  Discharge Placement                Patient chooses bed at: [facility] Patient to be transferred to facility by: [facility] Name of family member notified: Barry [name] Patient and family notified of of transfer: 06/10/23  Discharge Plan and Services Additional resources added to the After Visit Summary for     Discharge Planning Services: CM Consult                                 Social Determinants of Health (SDOH) Interventions SDOH Screenings   Food Insecurity: No Food Insecurity (06/06/2023)  Housing: Low Risk  (06/06/2023)  Transportation Needs: No Transportation Needs (06/06/2023)  Utilities: Not At Risk (06/06/2023)  Tobacco Use: Low Risk  (06/04/2023)     Readmission Risk Interventions     No data to display

## 2023-06-10 NOTE — Discharge Summary (Signed)
Physician Discharge Summary   Barry [name] XNA:[id number] DOB: 1946/01/08 DOA: 06/03/2023  PCP: Georgann Housekeeper, MD  Admit date: 06/03/2023 Discharge date:  06/10/2023  Admitted From: Home Disposition:  SNF Discharging physician: Lewie Chamber, MD Barriers to discharge: none  Recommendations at discharge: May need LTC Repeat Hgb at urology followup and/or if any further hematuria    Discharge Condition: stable CODE STATUS: Full Diet recommendation:  Diet Orders (From admission, onward)     Start     Ordered   06/10/23 0000  Diet general        06/10/23 1140   06/06/23 0738  Diet regular Fluid consistency: Thin  Diet effective now       Question:  Fluid consistency:  Answer:  Thin   06/06/23 0737            Hospital Course: Mr. Lubitz is a 77 year old male with PMH A-fib, asthma, CAD, GERD, glaucoma, HLD, HTN, obesity, osteoarthritis, ulcerative colitis who presented with left knee pain/swelling after a fall at home and also reports of some hematuria.  There was also questionable concern for hematochezia on admission but after further collateral information, it appears most of his symptoms were related to hematuria.  Initially GI had been consulted in setting of hemoglobin drop and the suspected hematochezia but GI bleed was ruled out after hematuria was the more prominent culprit.  He was not felt to have any exacerbation of his ulcerative colitis either. CT abdomen/pelvis was obtained which showed multiple bladder lesions.  Urology was also consulted and plan is for outpatient follow-up for possible TURBT.   Assessment and Plan: * Complicated UTI (urinary tract infection)-resolved as of 06/08/2023 - UA noted with negative nitrite, negative LE, rare bacteria, greater than 50 WBC.  In setting of hematuria, he was started on antibiotics in case of underlying UTI as well -Urine culture noted with <10k colonies - may just complete empiric course but ultimately suspect the hematuria  is related to the lesions with possible contribution from Eliquis  -Rocephin course completed during hospitalization  Bladder mass - per CT A/P on 6/17: "Multiple mass lesions along the bladder lumen with some areas of dystrophic calcification near the margin of the right UVJ" -Has been evaluated by urology with tentative plans for outpatient follow-up on 06/28/2023 for TURBT  Hematuria-resolved as of 06/09/2023 -Risks and benefits discussed regarding Eliquis with patient and his friend, Consuella Lose, bedside.  We will reattempt initiation of Eliquis and monitoring for any worsening hematuria along with hemoglobin drop -So far continues to have no further significant hematuria and hemoglobin remains stable (9.4 g/dL at discharge)  PAF (paroxysmal atrial fibrillation) (HCC) - R/B discussed about stopping/resuming; for now he's okay with resuming and monitoring response of hematuria as well as watching Hgb  - Hgb remaining stable   Physical deconditioning - Consuella Lose is concerned about his ability to return home independently - followup PT/OT evals: SNF recommended, may even need LTC  Effusion of left knee - s/p fall at home - xray shows degenerative changes and moderate joint effusion -No erythema mass, calor, exquisite tenderness; low suspicion for infection at this time - Continue knee immobilizer if helps and supportive care  Hx of CABG - Continue Eliquis, Lipitor, Lopressor, losartan  Hyperlipidemia LDL goal <70 - Continue ezetimibe  Benign essential HTN - Continue Lopressor, cardizem, losartan  CAD (coronary artery disease), native coronary artery - Continue Eliquis, Lipitor, Lopressor  ULCERATIVE COLITIS - no s/s flare/exac at this time; evaluated by GI -Continue  sulfasalazine  GASTROESOPHAGEAL REFLUX DISEASE - continue PPI  Asthma, chronic - No signs/symptoms of exacerbation - Continue as needed albuterol  Severe obesity (BMI >= 40) (HCC) - Complicates overall prognosis  and care - Body mass index is 37.3 kg/m.       The patient's chronic medical conditions were treated accordingly per the patient's home medication regimen except as noted.  On day of discharge, patient was felt deemed stable for discharge. Patient/family member advised to call PCP or come back to ER if needed.   Principal Diagnosis: Complicated UTI (urinary tract infection)  Discharge Diagnoses: Active Hospital Problems   Diagnosis Date Noted   Bladder mass 06/03/2023    Priority: 1.   PAF (paroxysmal atrial fibrillation) (HCC) 01/11/2023    Priority: 3.   Physical deconditioning 06/05/2023    Priority: 4.   Effusion of left knee 06/05/2023    Priority: 5.   Hx of CABG 06/04/2023   Pressure injury of skin 06/03/2023   Benign essential HTN 02/06/2019   CAD (coronary artery disease), native coronary artery 02/06/2019   Hyperlipidemia LDL goal <70 02/06/2019   GASTROESOPHAGEAL REFLUX DISEASE 08/27/2008   Severe obesity (BMI >= 40) (HCC) 11/24/2007   Asthma, chronic 11/24/2007   ULCERATIVE COLITIS 11/24/2007    Resolved Hospital Problems   Diagnosis Date Noted Date Resolved   Complicated UTI (urinary tract infection) 06/03/2023 06/08/2023    Priority: 3.   Hematuria 06/05/2023 06/09/2023    Priority: 2.     Discharge Instructions     Diet general   Complete by: As directed    Increase activity slowly   Complete by: As directed    No wound care   Complete by: As directed       Allergies as of 06/10/2023       Reactions   Piroxicam Itching, Rash        Medication List     TAKE these medications    acetaminophen 325 MG tablet Commonly known as: TYLENOL Take 2 tablets (650 mg total) by mouth every 4 (four) hours as needed for mild pain (or Fever >/= 101). What changed:  medication strength how much to take how to take this when to take this reasons to take this additional instructions   apixaban 5 MG Tabs tablet Commonly known as: ELIQUIS Take 1  tablet (5 mg total) by mouth 2 (two) times daily.   atorvastatin 40 MG tablet Commonly known as: LIPITOR TAKE 1 TABLET BY MOUTH EVERY DAY   budesonide-formoterol 160-4.5 MCG/ACT inhaler Commonly known as: Symbicort TAKE 2 PUFFS BY MOUTH TWICE A DAY   carboxymethylcellulose 0.5 % Soln Commonly known as: REFRESH PLUS Use as needed for dry eye   CENTRUM SILVER PO Take 1 tablet by mouth daily.   cycloSPORINE 0.05 % ophthalmic emulsion Commonly known as: RESTASIS Place 1 drop into both eyes 2 (two) times daily.   diltiazem 240 MG 24 hr capsule Commonly known as: CARDIZEM CD TAKE 1 CAPSULE BY MOUTH EVERY DAY   escitalopram 20 MG tablet Commonly known as: LEXAPRO Take 20 mg by mouth daily.   ezetimibe 10 MG tablet Commonly known as: ZETIA TAKE 1 TABLET BY MOUTH EVERY DAY   latanoprost 0.005 % ophthalmic solution Commonly known as: XALATAN Place 1 drop into both eyes at bedtime.   losartan 25 MG tablet Commonly known as: COZAAR TAKE 1 TABLET BY MOUTH EVERY DAY   metoprolol tartrate 50 MG tablet Commonly known as: LOPRESSOR TAKE 1 TABLET BY  MOUTH TWICE A DAY   montelukast 10 MG tablet Commonly known as: SINGULAIR TAKE ONE TABLET BY MOUTH AT BEDTIME EVERY NIGHT   nitroGLYCERIN 0.4 MG SL tablet Commonly known as: NITROSTAT Place 1 tablet (0.4 mg total) under the tongue every 5 (five) minutes as needed for chest pain.   OcuSoft Eyelid Cleansing Pads Apply topically as needed. Use daily   Slow Fe 142 (45 Fe) MG Tbcr Generic drug: Ferrous Sulfate 1 tablet Orally Once a day   sulfaSALAzine 500 MG tablet Commonly known as: AZULFIDINE Take 1,000 mg by mouth 4 (four) times daily.   Ventolin HFA 108 (90 Base) MCG/ACT inhaler Generic drug: albuterol INHALE 2 PUFFS INTO THE LUNGS EVERY 4 HRS AS NEEDED FOR WHEEZING OR SHORTNESS OF BREATH What changed: See the new instructions.   Vitamin D 50 MCG (2000 UT) tablet Take 2,000 Units by mouth daily.        Contact  information for after-discharge care     Destination     [facility] Preferred SNF .   Service: Skilled Nursing Contact information: [address] [place] [address] [phone]                    Allergies  Allergen Reactions   Piroxicam Itching and Rash    Consultations: Urology   Procedures:   Discharge Exam: BP 137/69   Pulse 92   Temp 98.2 F (36.8 C) (Oral)   Resp 19   Ht 5\' 3"  (1.6 m)   Wt 95.5 kg   SpO2 95%   BMI 37.30 kg/m  Physical Exam Constitutional:      General: He is not in acute distress.    Appearance: He is not ill-appearing.  HENT:     Head: Normocephalic and atraumatic.     Mouth/Throat:     Mouth: Mucous membranes are moist.  Eyes:     Extraocular Movements: Extraocular movements intact.  Cardiovascular:     Rate and Rhythm: Normal rate. Rhythm irregular.  Pulmonary:     Effort: Pulmonary effort is normal.     Breath sounds: Normal breath sounds.  Abdominal:     General: Bowel sounds are normal. There is no distension.     Palpations: Abdomen is soft.     Tenderness: There is no abdominal tenderness.  Musculoskeletal:     Cervical back: Normal range of motion and neck supple.     Comments: Enlarged and deformed B/L knees; mild swelling noted in left knee but no erythema, calor. Overall improved tenderness as well  Skin:    General: Skin is warm and dry.  Neurological:     General: No focal deficit present.     Mental Status: He is alert.  Psychiatric:        Mood and Affect: Mood normal.      The results of significant diagnostics from this hospitalization (including imaging, microbiology, ancillary and laboratory) are listed below for reference.   Microbiology: Recent Results (from the past 240 hour(s))  Urine Culture     Status: Abnormal   Collection Time: 06/03/23  9:12 AM   Specimen: Urine, Clean Catch  Result Value Ref Range Status   Specimen Description   Final    URINE, CLEAN  CATCH Performed at [facility], [address], [place], [place] [address]    Special Requests   Final    NONE Performed at [facility], [address] [place], [place]  [address]    Culture (A)  Final    <10,000 COLONIES/mL INSIGNIFICANT GROWTH Performed at [facility], [address], [place], [place] [address]    Report Status 06/04/2023 FINAL  Final  Gastrointestinal Panel by PCR , Stool     Status: None   Collection Time: 06/04/23  4:11 PM   Specimen: Rectum; Stool  Result Value Ref Range Status   Campylobacter species NOT DETECTED NOT DETECTED Final   Plesimonas shigelloides NOT DETECTED NOT DETECTED Final   Salmonella species NOT DETECTED NOT DETECTED Final   Yersinia enterocolitica NOT DETECTED NOT DETECTED Final   Vibrio species NOT DETECTED NOT DETECTED Final   Vibrio cholerae NOT DETECTED NOT DETECTED Final   Enteroaggregative E coli (EAEC) NOT DETECTED NOT DETECTED Final   Enteropathogenic E coli (EPEC) NOT DETECTED NOT DETECTED Final   Enterotoxigenic E coli (ETEC) NOT DETECTED NOT DETECTED Final   Shiga like toxin producing E coli (STEC) NOT DETECTED NOT DETECTED Final   Shigella/Enteroinvasive E coli (EIEC) NOT DETECTED NOT DETECTED Final   Cryptosporidium NOT DETECTED NOT DETECTED Final   Cyclospora cayetanensis NOT DETECTED NOT DETECTED Final   Entamoeba histolytica NOT DETECTED NOT DETECTED Final   Giardia lamblia NOT DETECTED NOT DETECTED Final   Adenovirus F40/41 NOT DETECTED NOT DETECTED Final   Astrovirus NOT DETECTED NOT DETECTED Final   Norovirus GI/GII NOT DETECTED NOT DETECTED Final   Rotavirus A NOT DETECTED NOT DETECTED Final   Sapovirus (I, II, IV, and V) NOT DETECTED NOT DETECTED Final    Comment: Performed at [facility], [address] [place], [place] [address]  Calprotectin, Fecal     Status: Abnormal   Collection Time: 06/04/23  4:11 PM   Specimen: Rectum; Stool  Result Value Ref  Range Status   Calprotectin, Fecal 573 (H) 0 - 120 ug/g Final    Comment: (NOTE) Concentration     Interpretation   Follow-Up < 5 - 50 ug/g     Normal           None >50 -120 ug/g     Borderline       Re-evaluate in 4-6 weeks    >120 ug/g     Abnormal         Repeat as clinically                                   indicated Performed At: [facility] [address] [place], [place] [id number] Jolene Schimke MD GB:[id number]      Labs: BNP (last 3 results) No results for input(s): "BNP" in the last 8760 hours. Basic Metabolic Panel: Recent Labs  Lab 06/05/23 0525 06/06/23 0501 06/07/23 0521 06/08/23 0531 06/09/23 0537  NA 136 133* 135 134* 134*  K 4.1 3.6 3.7 3.7 4.1  CL 103 103 103 104 101  CO2 25 22 25  20* 25  GLUCOSE 132* 114* 129* 113* 109*  BUN 26* 24* 43* 37* 34*  CREATININE 0.84 0.82 1.30* 0.80 0.87  CALCIUM 8.9 8.3* 8.2* 8.4* 8.3*  MG  --  1.9 2.2 2.4 2.2   Liver Function Tests: Recent Labs  Lab 06/04/23 0453 06/05/23 0525 06/06/23 0501 06/07/23 0521  AST 27 19 16  32  ALT 11 15 13 23   ALKPHOS 56 63 62 69  BILITOT 1.1 0.6 0.7 0.4  PROT 6.0* 6.0* 5.7* 5.4*  ALBUMIN 3.1* 3.2* 2.9* 2.7*  No results for input(s): "LIPASE", "AMYLASE" in the last 168 hours. No results for input(s): "AMMONIA" in the last 168 hours. CBC: Recent Labs  Lab 06/05/23 0525 06/06/23 0501 06/07/23 0521 06/08/23 0531 06/09/23 0537  WBC 7.7 8.5 7.4 6.5 5.5  NEUTROABS  --   --   --   --  4.1  HGB 10.2* 10.1* 9.3* 9.2* 9.4*  HCT 33.3* 33.7* 31.1* 32.0* 31.0*  MCV 94.6 93.6 94.2 96.1 93.1  PLT 236 234 233 219 231   Cardiac Enzymes: No results for input(s): "CKTOTAL", "CKMB", "CKMBINDEX", "TROPONINI" in the last 168 hours. BNP: Invalid input(s): "POCBNP" CBG: No results for input(s): "GLUCAP" in the last 168 hours. D-Dimer No results for input(s): "DDIMER" in the last 72 hours. Hgb A1c No results for input(s): "HGBA1C" in the last 72 hours. Lipid Profile No  results for input(s): "CHOL", "HDL", "LDLCALC", "TRIG", "CHOLHDL", "LDLDIRECT" in the last 72 hours. Thyroid function studies No results for input(s): "TSH", "T4TOTAL", "T3FREE", "THYROIDAB" in the last 72 hours.  Invalid input(s): "FREET3" Anemia work up No results for input(s): "VITAMINB12", "FOLATE", "FERRITIN", "TIBC", "IRON", "RETICCTPCT" in the last 72 hours. Urinalysis    Component Value Date/Time   COLORURINE AMBER (A) 06/03/2023 0912   APPEARANCEUR CLOUDY (A) 06/03/2023 0912   LABSPEC 1.015 06/03/2023 0912   PHURINE 5.0 06/03/2023 0912   GLUCOSEU NEGATIVE 06/03/2023 0912   HGBUR LARGE (A) 06/03/2023 0912   BILIRUBINUR NEGATIVE 06/03/2023 0912   KETONESUR NEGATIVE 06/03/2023 0912   PROTEINUR 100 (A) 06/03/2023 0912   UROBILINOGEN 0.2 11/12/2013 0047   NITRITE NEGATIVE 06/03/2023 0912   LEUKOCYTESUR NEGATIVE 06/03/2023 0912   Sepsis Labs Recent Labs  Lab 06/06/23 0501 06/07/23 0521 06/08/23 0531 06/09/23 0537  WBC 8.5 7.4 6.5 5.5   Microbiology Recent Results (from the past 240 hour(s))  Urine Culture     Status: Abnormal   Collection Time: 06/03/23  9:12 AM   Specimen: Urine, Clean Catch  Result Value Ref Range Status   Specimen Description   Final    URINE, CLEAN CATCH Performed at [facility], [address] [place], [place] [address]    Special Requests   Final    NONE Performed at [facility], [address] [place], [place] [address]    Culture (A)  Final    <10,000 COLONIES/mL INSIGNIFICANT GROWTH Performed at [facility], [address] [place], [place] [address]    Report Status 06/04/2023 FINAL  Final  Gastrointestinal Panel by PCR , Stool     Status: None   Collection Time: 06/04/23  4:11 PM   Specimen: Rectum; Stool  Result Value Ref Range Status   Campylobacter species NOT DETECTED NOT DETECTED Final   Plesimonas shigelloides NOT DETECTED NOT DETECTED Final   Salmonella species NOT DETECTED  NOT DETECTED Final   Yersinia enterocolitica NOT DETECTED NOT DETECTED Final   Vibrio species NOT DETECTED NOT DETECTED Final   Vibrio cholerae NOT DETECTED NOT DETECTED Final   Enteroaggregative E coli (EAEC) NOT DETECTED NOT DETECTED Final   Enteropathogenic E coli (EPEC) NOT DETECTED NOT DETECTED Final   Enterotoxigenic E coli (ETEC) NOT DETECTED NOT DETECTED Final   Shiga like toxin producing E coli (STEC) NOT DETECTED NOT DETECTED Final   Shigella/Enteroinvasive E coli (EIEC) NOT DETECTED NOT DETECTED Final   Cryptosporidium NOT DETECTED NOT DETECTED Final   Cyclospora cayetanensis NOT DETECTED NOT DETECTED Final   Entamoeba histolytica NOT DETECTED NOT DETECTED Final  Giardia lamblia NOT DETECTED NOT DETECTED Final   Adenovirus F40/41 NOT DETECTED NOT DETECTED Final   Astrovirus NOT DETECTED NOT DETECTED Final   Norovirus GI/GII NOT DETECTED NOT DETECTED Final   Rotavirus A NOT DETECTED NOT DETECTED Final   Sapovirus (I, II, IV, and V) NOT DETECTED NOT DETECTED Final    Comment: Performed at [facility], [address] [place], [place] [address]  Calprotectin, Fecal     Status: Abnormal   Collection Time: 06/04/23  4:11 PM   Specimen: Rectum; Stool  Result Value Ref Range Status   Calprotectin, Fecal 573 (H) 0 - 120 ug/g Final    Comment: (NOTE) Concentration     Interpretation   Follow-Up < 5 - 50 ug/g     Normal           None >50 -120 ug/g     Borderline       Re-evaluate in 4-6 weeks    >120 ug/g     Abnormal         Repeat as clinically                                   indicated Performed At: [facility] [address] [place], [place] [id number] Jolene Schimke MD XB:[id number]     Procedures/Studies: DG Knee 3 Views Left  Result Date: 06/04/2023 CLINICAL DATA:  Left knee pain, no known injury, initial encounter EXAM: LEFT KNEE - 3 VIEW COMPARISON:  06/25/2022 FINDINGS: Extensive degenerative changes are noted worst in the lateral joint space  moderate joint effusion is seen. The overall appearance is similar to that noted on the prior exam. Postsurgical changes along the medial calf are seen. IMPRESSION: Degenerative changes stable from the prior exam. Electronically Signed   By: Alcide Clever M.D.   On: 06/04/2023 19:45   CT ABDOMEN PELVIS W CONTRAST  Result Date: 06/03/2023 CLINICAL DATA:  Pain history of ulcerative colitis. EXAM: CT ABDOMEN AND PELVIS WITH CONTRAST TECHNIQUE: Multidetector CT imaging of the abdomen and pelvis was performed using the standard protocol following bolus administration of intravenous contrast. RADIATION DOSE REDUCTION: This exam was performed according to the departmental dose-optimization program which includes automated exposure control, adjustment of the mA and/or kV according to patient size and/or use of iterative reconstruction technique. CONTRAST:  OMNIPAQUE IOHEXOL 300 MG/ML  SOLN COMPARISON:  None Available. FINDINGS: Lower chest: Breathing motion at the lung bases. Patient is status post median sternotomy. Coronary artery calcifications are seen. Elevated left hemidiaphragm. Calcified nodule left lower lobe series 5, image 28 consistent with old granulomatous disease. Hepatobiliary: No space-occupying liver lesion. Patent portal vein. Gallbladder is nondilated. Pancreas: Unremarkable. No pancreatic ductal dilatation or surrounding inflammatory changes. Spleen: Normal in size without focal abnormality. Adrenals/Urinary Tract: Slight nonspecific nodularity of the adrenal glands. The left kidney is absent. The remnant distal left ureter is mildly dilated. Nonobstructing right-sided renal stone along the renal pelvis measuring 8 mm on series 3, image 40. No separate enhancing right-sided renal mass or right-sided renal collecting system dilatation. The bladder however has multiple mass lesions along the lumen diffusely distributed. Please correlate for any known history including for potential bladder  cancer. Area of dystrophic calcification as well along the posterior aspect of the the bladder on the right side near the margin of the UVJ. Stomach/Bowel: No oral contrast. Stomach is nondilated. Small bowel is nondilated. There is a large  left inguinal hernia involving a significant portion of the sigmoid colon. This a wide mouth hernia. No signs of obstruction. Few areas of colonic diverticula. No specific colonic wall thickening, inflammatory stranding. The appendix is poorly seen in the right lower quadrant but no pericecal stranding or fluid. Vascular/Lymphatic: Aortic atherosclerosis. No enlarged abdominal or pelvic lymph nodes. Reproductive: Prostate is unremarkable. Other: Small fat containing umbilical hernia Musculoskeletal: Curvature of the spine. Scattered degenerative changes of the spine and pelvis. IMPRESSION: Multiple mass lesions along the bladder lumen with some areas of dystrophic calcification near the margin of the right UVJ. Please correlate for any known history. Neoplasm is possible recommend further workup when appropriate otherwise. Absence of the left kidney. Nonobstructing right-sided renal stone. No bowel obstruction, free air or free fluid. Large left inguinal hernia involving sigmoid colon. Electronically Signed   By: Karen Kays M.D.   On: 06/03/2023 11:13     Time coordinating discharge: Over 30 minutes    Lewie Chamber, MD  [facility] 06/10/2023, 11:43 AM

## 2023-06-10 NOTE — Plan of Care (Signed)
  Problem: Education: Goal: Knowledge of General Education information will improve Description: Including pain rating scale, medication(s)/side effects and non-pharmacologic comfort measures 06/10/2023 1259 by Shirleen Schirmer, RN Outcome: Adequate for Discharge 06/10/2023 1253 by Shirleen Schirmer, RN Outcome: Progressing

## 2023-06-10 NOTE — Progress Notes (Signed)
Patient discharged to [facility] via [place], report called to [place] at facility. Patient alert, denies any pain/distress. Dry/scaly skin, bruises, MASD to buttocks. Patient belonging and discharge packet given to [facility] transporter for facility.

## 2023-06-12 DIAGNOSIS — K922 Gastrointestinal hemorrhage, unspecified: Secondary | ICD-10-CM | POA: Diagnosis not present

## 2023-06-12 DIAGNOSIS — N3289 Other specified disorders of bladder: Secondary | ICD-10-CM | POA: Diagnosis not present

## 2023-06-12 DIAGNOSIS — I480 Paroxysmal atrial fibrillation: Secondary | ICD-10-CM | POA: Diagnosis not present

## 2023-06-12 DIAGNOSIS — R310 Gross hematuria: Secondary | ICD-10-CM | POA: Diagnosis not present

## 2023-06-13 DIAGNOSIS — N329 Bladder disorder, unspecified: Secondary | ICD-10-CM | POA: Diagnosis not present

## 2023-06-13 DIAGNOSIS — M6281 Muscle weakness (generalized): Secondary | ICD-10-CM | POA: Diagnosis not present

## 2023-06-13 DIAGNOSIS — Z9181 History of falling: Secondary | ICD-10-CM | POA: Diagnosis not present

## 2023-06-13 DIAGNOSIS — J4550 Severe persistent asthma, uncomplicated: Secondary | ICD-10-CM | POA: Diagnosis not present

## 2023-06-13 DIAGNOSIS — R2689 Other abnormalities of gait and mobility: Secondary | ICD-10-CM | POA: Diagnosis not present

## 2023-06-13 DIAGNOSIS — I480 Paroxysmal atrial fibrillation: Secondary | ICD-10-CM | POA: Diagnosis not present

## 2023-06-14 ENCOUNTER — Encounter (HOSPITAL_COMMUNITY): Payer: Self-pay | Admitting: Urology

## 2023-06-14 ENCOUNTER — Telehealth: Payer: Self-pay | Admitting: Cardiovascular Disease

## 2023-06-14 NOTE — Progress Notes (Signed)
For Anesthesia: PCP - Dr. Deborha Payment William/ Georgann Housekeeper, MD  Cardiologist - Charlton Haws, MD  Pulmonologist- Nyoka Cowden, MD   Chest x-ray - greater than 1 year in [facility] EKG - 06/03/23 in [facility] Stress Test - 08/13/22 in [facility] ECHO - 01/09/23 in [facility] Cardiac Cath - yes  Pacemaker/ICD device last checked: N/A Pacemaker orders received: N/A Device Rep notified: N/A  Spinal Cord Stimulator: N/A  Sleep Study - N/A  CPAP -  N/A  Fasting Blood Sugar - N/A Checks Blood Sugar __N/A___ times a day Date and result of last Hgb A1c-N/A  Last dose of GLP1 agonist- N/A GLP1 instructions: N/A  Last dose of SGLT-2 inhibitors- N/A SGLT-2 instructions:N/A  Blood Thinner Instructions: Eliquis Aspirin Instructions: Last Dose:  Activity level: non ambulatory in wheelchair     Anesthesia review: A-fib, CAD , Hypertension, paralyzed left hemidiaphragm , CABGx6   Patient denies shortness of breath, fever, cough and chest pain during pre op phone call.   Patient verbalized understanding of instructions reviewed via telephone.

## 2023-06-14 NOTE — Telephone Encounter (Signed)
   Pre-operative Risk Assessment    Patient Name: Barry [name]  DOB: [birth year] MRN: [id number]      Request for Surgical Clearance    Procedure:   Transurethral Resection of Bladder Tumor  Date of Surgery:  Clearance 06/28/23                                 Surgeon:  Dr. Gaspar Garbe Surgeon's Group or Practice Name:  [facility] Phone number:  [phone] Ext. [phone] Fax number:  [phone]   Type of Clearance Requested:   - Pharmacy:  Hold Apixaban (Eliquis) for 2 days prior   Type of Anesthesia:  General    Additional requests/questions:  Please fax a copy of medical clearance to the surgeon's office.  [name]   06/14/2023, 10:55 AM

## 2023-06-14 NOTE — Telephone Encounter (Signed)
   Patient Name: Barry [name]  DOB: 1946-08-20 MRN: [id number]  Primary Cardiologist: Charlton Haws, MD  Clinical pharmacists have reviewed the patient's past medical history, labs, and current medications as part of preoperative protocol coverage. The following recommendations have been made:  Per office protocol, patient can hold Eliquis for 3 days prior to procedure.    I will route this recommendation to the requesting party via Epic fax function and remove from pre-op pool.  Please call with questions.  Napoleon Form, Leodis Rains, NP 06/14/2023, 11:15 AM

## 2023-06-14 NOTE — Telephone Encounter (Signed)
Pharmacy please advise on holding Eliquis prior to TURP scheduled for 06/28/2023. Thank you.

## 2023-06-14 NOTE — Telephone Encounter (Signed)
Patient with diagnosis of A Fib on Eliquis for anticoagulation.    Procedure: Transurethral Resection of Bladder Tumor  Date of procedure: 06/28/23   CHA2DS2-VASc Score = 4  This indicates a 4.8% annual risk of stroke. The patient's score is based upon: CHF History: 0 HTN History: 1 Diabetes History: 0 Stroke History: 0 Vascular Disease History: 1 Age Score: 2 Gender Score: 0   CrCl 88 mL/min Platelet count 231K   Per office protocol, patient can hold Eliquis for 3 days prior to procedure.    **This guidance is not considered finalized until pre-operative APP has relayed final recommendations.**

## 2023-06-14 NOTE — Patient Instructions (Addendum)
Preop instructions for:     Barry [name] Date of Birth:  [birth year]                      Date of Procedure:   Friday, June 28, 2023 Procedure:  CYSTOSCOPY TRANSURETHRAL RESECTION OF BLADDER TUMOR (TURBT)      Surgeon: Dr. Bjorn Pippin Facility contact: [name]    Phone:  [phone]               Health Care POA: RN contact name/phone#:                          and Fax #: [phone]   Transportation contact phone#: [name] [facility]) [phone]   Please send day of procedure:  Current med list  Medications taken the day of procedure (return attached form to hospital) confirm time of nothing by mouth status (return attached form to hospital) Patient Demographic info( to include DNR status, problem list, allergies) Bring Insurance card and picture ID    Time to arrive at [facility]: 5:15 AM   Report to: Admitting (On your left hand side)    Do not eat solid food or drink past midnight the night before your procedure.(To include any tube feedings-must be discontinued)  STOP TAKING ELIQUIS ---- DAYS PRIOR TO SURGERY!  Take these morning medications only with sips of water.(or give through gastrostomy or feeding tube).  Atorvastatin Diltiazem Escitalopram Ezetimibe Metoprolol Sulfasalazine Use Symbicort  Bring Rescue Inhaler                Note: No Insulin or Diabetic meds should be given or taken the morning of the procedure!  Oral Hygiene is also important to reduce your risk of infection.                                    Remember - BRUSH YOUR TEETH THE MORNING OF SURGERY WITH YOUR REGULAR TOOTHPASTE   DENTURES WILL BE REMOVED PRIOR TO SURGERY PLEASE DO NOT APPLY "Poly grip" OR ADHESIVES!!!  Leave all jewelry and other valuables at place where living( no metal or rings to be worn) No contact lens  Men-no colognes,lotions   Any questions day of procedure,call  SHORT STAY-[phone]     Sent from :[facility]  Presurgical Testing                   Phone:[phone]                   Fax:[phone]   Sent by :  [name] BSN,  RN

## 2023-06-17 DIAGNOSIS — R2689 Other abnormalities of gait and mobility: Secondary | ICD-10-CM | POA: Diagnosis not present

## 2023-06-17 DIAGNOSIS — Z9181 History of falling: Secondary | ICD-10-CM | POA: Diagnosis not present

## 2023-06-17 DIAGNOSIS — M6281 Muscle weakness (generalized): Secondary | ICD-10-CM | POA: Diagnosis not present

## 2023-06-17 DIAGNOSIS — I480 Paroxysmal atrial fibrillation: Secondary | ICD-10-CM | POA: Diagnosis not present

## 2023-06-17 DIAGNOSIS — N329 Bladder disorder, unspecified: Secondary | ICD-10-CM | POA: Diagnosis not present

## 2023-06-17 DIAGNOSIS — J4550 Severe persistent asthma, uncomplicated: Secondary | ICD-10-CM | POA: Diagnosis not present

## 2023-06-18 DIAGNOSIS — K922 Gastrointestinal hemorrhage, unspecified: Secondary | ICD-10-CM | POA: Diagnosis not present

## 2023-06-18 DIAGNOSIS — I480 Paroxysmal atrial fibrillation: Secondary | ICD-10-CM | POA: Diagnosis not present

## 2023-06-18 DIAGNOSIS — R310 Gross hematuria: Secondary | ICD-10-CM | POA: Diagnosis not present

## 2023-06-18 DIAGNOSIS — N3289 Other specified disorders of bladder: Secondary | ICD-10-CM | POA: Diagnosis not present

## 2023-06-18 NOTE — Telephone Encounter (Signed)
Closing encounter. nfn 

## 2023-06-19 NOTE — Progress Notes (Signed)
Anesthesia Chart Review   Case: [id number] Date/Time: 06/28/23 0715   Procedure: CYSTOSCOPY TRANSURETHRAL RESECTION OF BLADDER TUMOR (TURBT)   Anesthesia type: General   Pre-op diagnosis: BLADDER TUMOR   Location: [facility] / [facility]   Surgeons: Bjorn Pippin, MD       DISCUSSION:77 y.o. never smoker with h/o HTN, CAD (CABG), a-fib, bladder tumor scheduled for above procedure 06/28/2023 with Dr. Bjorn Pippin.   Recent admission 6/17-6/24/2024 due to  VS: Ht 5\' 3"  (1.6 m)   Wt 90.7 kg   BMI 35.43 kg/m   PROVIDERS: Georgann Housekeeper, MD is PCP   Primary Cardiologist: Charlton Haws, MD  LABS: {CHL AN LABS REVIEWED:112001::"Labs reviewed: Acceptable for surgery."} (all labs ordered are listed, but only abnormal results are displayed)  Labs Reviewed - No data to display   IMAGES:   EKG:   CV: Echo 01/09/2023 1. Left ventricular ejection fraction, by estimation, is 50 to 55%. The  left ventricle has low normal function. The left ventricle demonstrates  regional wall motion abnormalities (see scoring diagram/findings for  description). Left ventricular diastolic   parameters are indeterminate.   2. Right ventricular systolic function is normal. The right ventricular  size is normal.   3. The mitral valve is normal in structure. Trivial mitral valve  regurgitation. No evidence of mitral stenosis.   4. The aortic valve is normal in structure. Aortic valve regurgitation is  trivial. Aortic valve sclerosis/calcification is present, without any  evidence of aortic stenosis.   5. The inferior vena cava is normal in size with greater than 50%  respiratory variability, suggesting right atrial pressure of 3 mmHg.   Myocardial Perfusion 08/13/2022   No ST deviation was noted. The ECG was not diagnostic due to pharmacologic protocol.   Diaphragmatic attenuation artifact was present.   LV perfusion is abnormal. Defect 1: There is a small defect with mild reduction in uptake present in  the mid to basal inferior location(s) that is fixed. There is normal wall motion in the defect area. Consistent with artifact caused by diaphragmatic attenuation.   Left ventricular function is normal. Nuclear stress EF: 57 %. The left ventricular ejection fraction is normal (55-65%). End diastolic cavity size is normal. End systolic cavity size is normal.   Prior study not available for comparison.   Findings are consistent with no ischemia. The study is low risk. Past Medical History:  Diagnosis Date   A-fib [facility])    Acute sinusitis 03/24/2012   Followed in Pulmonary clinic/ [facility]/ [name] - CT sinus 08/07/2013  Short air-fluid levels in the maxillary sinuses bilaterally suggesting early acute sinusitis. - repeat ct sinus 04/30/2014 > ok     Acute sinusitis, unspecified 08/14/2010   Qualifier: Diagnosis of  By: Clent Ridges NP, [name]     Anemia    Asthma    PFTs 06/15/05 FEV1 85% predicted ratio 68% and truncation of resp loop in a sawtooth pattern. HFA 25% 08-01-2009 >50% Nov 01, 2009>50% 01-31-2010   Asthma, chronic 11/24/2007   Followed in Pulmonary clinic/ [facility]/ [name]  - PFTs 06/15/2005 FEV1 85% predicted ratio 68% and truncation of respiratory loop in a sawtooth pattern  - HFA 25% August 01, 2009 > 50% November 01, 2009 > 50% January 31, 2010 therefore changed to neb bud/brovana - HFA 75% p extensive coaching 08/07/2013  > insurance issues so try symbicort 160 2 bid instead of neb - 11/16/2015   p extensive   Bladder mass  BRONCHITIS, ACUTE 01/22/2011   Qualifier: Diagnosis of  By: Clent Ridges NP, [name]     CAD (coronary artery disease), native coronary artery 02/06/2019   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   Cataract    Chronic cough    Chronic rhinitis 05/02/2009   Followed in Pulmonary clinic/ [facility]/ [name]    - 03/24/12 CT Sinus > Negative paranasal sinuses - Sinus CT 08/07/2013 >>Short air-fluid levels in the maxillary sinuses bilaterally  suggesting early acute sinusitis - Repeat augmentin x 21 days 03/27/2014 then sinus ct if not better  - flonase/afrin regimen 02/11/15 >>>      Coronary heart disease    COUGH, CHRONIC 11/24/2007   Followed in Pulmonary clinic/ [facility]/ [name]      DDD (degenerative disc disease), lumbar    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   Followed in Pulmonary clinic/ [facility]/ [name]  -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     Excessive ear wax, bilateral 09/15/2018   GASTROESOPHAGEAL REFLUX DISEASE 08/27/2008   Followed as Primary Care Patient/ GI/ Dr  Danise Edge     Glaucoma    Hyperlipidemia LDL goal <70 02/06/2019   Hypertension    Obesity    Osteoarthritis    Paralyzed hemidiaphragm    left, 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   Retina disorder 2014   Severe obesity (BMI >= 40) (HCC) 11/24/2007       - Target wt < 191 to get under BMI 30    Ulcerative colitis    ULCERATIVE COLITIS 11/24/2007   Followed as Primary Care Patient/ GI/ Dr  Danise Edge     URI 04/09/2008   Qualifier: Diagnosis of  By: Clent Ridges NP, [name]      Past Surgical History:  Procedure Laterality Date   CARDIAC CATHETERIZATION Left    COLONOSCOPY WITH PROPOFOL N/A 11/29/2015   Procedure: COLONOSCOPY WITH PROPOFOL;  Surgeon: Charolett Bumpers, MD;  Location[facility];  Service: Endoscopy;  Laterality: N/A;   CORONARY ARTERY BYPASS GRAFT     x6    MEDICATIONS: No current facility-administered medications for this encounter.    acetaminophen (TYLENOL) 325 MG tablet   apixaban (ELIQUIS) 5 MG TABS tablet   atorvastatin (LIPITOR) 40 MG tablet   budesonide-formoterol (SYMBICORT) 160-4.5 MCG/ACT inhaler   carboxymethylcellulose (REFRESH PLUS) 0.5 % SOLN   Cholecalciferol (VITAMIN D) 50 MCG (2000 UT) tablet   cycloSPORINE (RESTASIS) 0.05 % ophthalmic emulsion   diltiazem (CARDIZEM CD) 240 MG 24 hr capsule   escitalopram (LEXAPRO) 20 MG tablet   Eyelid Cleansers (OCUSOFT EYELID CLEANSING) PADS    ezetimibe (ZETIA) 10 MG tablet   Ferrous Sulfate (SLOW FE) 142 (45 Fe) MG TBCR   latanoprost (XALATAN) 0.005 % ophthalmic solution   losartan (COZAAR) 25 MG tablet   metoprolol tartrate (LOPRESSOR) 50 MG tablet   montelukast (SINGULAIR) 10 MG tablet   Multiple Vitamins-Minerals (CENTRUM SILVER PO)   nitroGLYCERIN (NITROSTAT) 0.4 MG SL tablet   sulfaSALAzine (AZULFIDINE) 500 MG tablet   VENTOLIN HFA 108 (90 Base) MCG/ACT inhaler

## 2023-06-19 NOTE — Telephone Encounter (Signed)
Left message to call back to set up tele pre op appt.  

## 2023-06-19 NOTE — Telephone Encounter (Signed)
Barry [name], [facility] sent a secure chat to Robin Searing, NP who had recently cleared the pt. Per Shanda Bumps, [facility] they needed cardiac clearance as well. Per Alden Server it seems the request came to [place] as needing pharm clearance. Alden Server asked me to please addressed this with the pre op APP today.

## 2023-06-19 NOTE — Telephone Encounter (Signed)
   Name: Barry [name]  DOB: Jan 29, 1946  MRN: [id number]  Primary Cardiologist: Charlton Haws, MD   Preoperative team, please contact this patient and set up a phone call appointment for further preoperative risk assessment. Please obtain consent and complete medication review. Thank you for your help.  I confirm that guidance regarding antiplatelet and oral anticoagulation therapy has been completed and, if necessary, noted below.  Per pharm D: Patient with diagnosis of A Fib on Eliquis for anticoagulation.     Procedure: Transurethral Resection of Bladder Tumor  Date of procedure: 06/28/23     CHA2DS2-VASc Score = 4  This indicates a 4.8% annual risk of stroke. The patient's score is based upon: CHF History: 0 HTN History: 1 Diabetes History: 0 Stroke History: 0 Vascular Disease History: 1 Age Score: 2 Gender Score: 0     CrCl 88 mL/min Platelet count 231K     Per office protocol, patient can hold Eliquis for 3 days prior to procedure.      Carlos Levering, NP 06/19/2023, 5:00 PM [facility]

## 2023-06-20 DIAGNOSIS — R310 Gross hematuria: Secondary | ICD-10-CM | POA: Diagnosis not present

## 2023-06-20 DIAGNOSIS — I480 Paroxysmal atrial fibrillation: Secondary | ICD-10-CM | POA: Diagnosis not present

## 2023-06-20 DIAGNOSIS — M25462 Effusion, left knee: Secondary | ICD-10-CM | POA: Diagnosis not present

## 2023-06-20 DIAGNOSIS — K922 Gastrointestinal hemorrhage, unspecified: Secondary | ICD-10-CM | POA: Diagnosis not present

## 2023-06-21 NOTE — Telephone Encounter (Signed)
Lvm to call back to call back set appointment.

## 2023-06-21 NOTE — Telephone Encounter (Signed)
I will send FYI to requesting office to update that we are trying to reach pt for a tele pre op appt. We have been unable to reach the pt.

## 2023-06-24 DIAGNOSIS — Z9181 History of falling: Secondary | ICD-10-CM | POA: Diagnosis not present

## 2023-06-24 DIAGNOSIS — I480 Paroxysmal atrial fibrillation: Secondary | ICD-10-CM | POA: Diagnosis not present

## 2023-06-24 DIAGNOSIS — J4550 Severe persistent asthma, uncomplicated: Secondary | ICD-10-CM | POA: Diagnosis not present

## 2023-06-24 DIAGNOSIS — M6281 Muscle weakness (generalized): Secondary | ICD-10-CM | POA: Diagnosis not present

## 2023-06-24 DIAGNOSIS — R2689 Other abnormalities of gait and mobility: Secondary | ICD-10-CM | POA: Diagnosis not present

## 2023-06-24 DIAGNOSIS — N329 Bladder disorder, unspecified: Secondary | ICD-10-CM | POA: Diagnosis not present

## 2023-06-26 ENCOUNTER — Ambulatory Visit: Payer: Medicare Other | Attending: Cardiology

## 2023-06-26 DIAGNOSIS — Z01810 Encounter for preprocedural cardiovascular examination: Secondary | ICD-10-CM | POA: Diagnosis not present

## 2023-06-26 DIAGNOSIS — I480 Paroxysmal atrial fibrillation: Secondary | ICD-10-CM | POA: Diagnosis not present

## 2023-06-26 DIAGNOSIS — M6281 Muscle weakness (generalized): Secondary | ICD-10-CM | POA: Diagnosis not present

## 2023-06-26 DIAGNOSIS — I10 Essential (primary) hypertension: Secondary | ICD-10-CM | POA: Diagnosis not present

## 2023-06-26 DIAGNOSIS — M25462 Effusion, left knee: Secondary | ICD-10-CM | POA: Diagnosis not present

## 2023-06-26 NOTE — Telephone Encounter (Signed)
Finally reached

## 2023-06-26 NOTE — Progress Notes (Addendum)
Virtual Visit via Telephone Note   Because of Barry [name] [facility] co-morbid illnesses, he is at least at moderate risk for complications without adequate follow up.  This format is felt to be most appropriate for this patient at this time.  The patient did not have access to video technology/had technical difficulties with video requiring transitioning to audio format only (telephone).  All issues noted in this document were discussed and addressed.  No physical exam could be performed with this format.  Please refer to the patient's chart for his consent to telehealth for [facility].  Evaluation Performed:  Preoperative cardiovascular risk assessment _____________   Date:  06/26/2023   Patient ID:  Barry [name], DOB 1946-11-19, MRN [id number] Patient Location:  Home Provider location:   Office  Primary Care Provider:  Georgann Housekeeper, MD Primary Cardiologist:  Charlton Haws, MD  Chief Complaint / Patient Profile   77 y.o. y/o male with a h/o atrial fibrillation, CAD s/p CABG 2001, asthma, hypertension, hyperlipidemia,  who is pending cystoscopy transurethral resection of the bladder tumor and presents today for telephonic preoperative cardiovascular risk assessment.  History of Present Illness    Barry [name] is a 77 y.o. male who presents via audio/video conferencing for a telehealth visit today.  Pt was last seen in cardiology clinic on 03/13/2023 by myself.  At that time Barry [name] was doing well other than a syncopal episode.  Denied chest pain, shortness of breath, and palpitations.  Does have a history of atrial fibrillation and was tolerating Eliquis without any bleeding at that time.  The patient is now pending procedure as outlined above. Since his last visit, he has had a series of unfortunate events.  Was in the hospital in June after a fall at home.  Also was having hematuria.  Questionable concern for hematochezia on admission.  Initially, GI was consulted in the  setting of hemoglobin drop and suspected hematochezia but GI bleed was ruled out after hematuria was more of the prominent culprit.  Plan was for outpatient TURBT per urology.  Was discharged to rehab facility where now he is bed ridden.  Doing very small amounts of physical therapy as able.  Main symptom is weakness.  No chest pain or shortness of breath.  When asked what his anginal equivalent was back during his CABG was the inability to do what he usually does.  Unfortunately, he is not a candidate at this moment for stress test given his current state.  Discussed with Dr. Anne Fu, DOD.  He is already holding his Eliquis for surgery on Friday 7/12.  He can hold for 3 days prior to the procedure and restart when medically safe to do so.  Recent labs reviewed with hemoglobin of 9.4 (he has not needed a transfusion).  He does state that knee pain might also be contributing to his inability to walk.  Because of his current state he obviously is not able to meet 4 METS on the DASI.    Past Medical History    Past Medical History:  Diagnosis Date   A-fib [facility])    Acute sinusitis 03/24/2012   Followed in Pulmonary clinic/ [facility]/ [name] - CT sinus 08/07/2013  Short air-fluid levels in the maxillary sinuses bilaterally suggesting early acute sinusitis. - repeat ct sinus 04/30/2014 > ok     Acute sinusitis, unspecified 08/14/2010   Qualifier: Diagnosis of  By: Clent Ridges NP, [name]     Anemia    Asthma  PFTs 06/15/05 FEV1 85% predicted ratio 68% and truncation of resp loop in a sawtooth pattern. HFA 25% 08-01-2009 >50% Nov 01, 2009>50% 01-31-2010   Asthma, chronic 11/24/2007   Followed in Pulmonary clinic/ [facility]/ [name]  - PFTs 06/15/2005 FEV1 85% predicted ratio 68% and truncation of respiratory loop in a sawtooth pattern  - HFA 25% August 01, 2009 > 50% November 01, 2009 > 50% January 31, 2010 therefore changed to neb bud/brovana - HFA 75% p extensive coaching 08/07/2013  > insurance  issues so try symbicort 160 2 bid instead of neb - 11/16/2015   p extensive   Bladder mass    BRONCHITIS, ACUTE 01/22/2011   Qualifier: Diagnosis of  By: Clent Ridges NP, [name]     CAD (coronary artery disease), native coronary artery 02/06/2019   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   Cataract    Chronic cough    Chronic rhinitis 05/02/2009   Followed in Pulmonary clinic/ [facility]/ [name]    - 03/24/12 CT Sinus > Negative paranasal sinuses - Sinus CT 08/07/2013 >>Short air-fluid levels in the maxillary sinuses bilaterally suggesting early acute sinusitis - Repeat augmentin x 21 days 03/27/2014 then sinus ct if not better  - flonase/afrin regimen 02/11/15 >>>      Coronary heart disease    COUGH, CHRONIC 11/24/2007   Followed in Pulmonary clinic/ [facility]/ [name]      DDD (degenerative disc disease), lumbar    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   Followed in Pulmonary clinic/ [facility]/ [name]  -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     Excessive ear wax, bilateral 09/15/2018   GASTROESOPHAGEAL REFLUX DISEASE 08/27/2008   Followed as Primary Care Patient/ GI/ Dr  Danise Edge     Glaucoma    Hyperlipidemia LDL goal <70 02/06/2019   Hypertension    Obesity    Osteoarthritis    Paralyzed hemidiaphragm    left, 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   Retina disorder 2014   Severe obesity (BMI >= 40) (HCC) 11/24/2007       - Target wt < 191 to get under BMI 30    Ulcerative colitis    ULCERATIVE COLITIS 11/24/2007   Followed as Primary Care Patient/ GI/ Dr  Danise Edge     URI 04/09/2008   Qualifier: Diagnosis of  By: Clent Ridges NP, [name]     Past Surgical History:  Procedure Laterality Date   CARDIAC CATHETERIZATION Left    COLONOSCOPY WITH PROPOFOL N/A 11/29/2015   Procedure: COLONOSCOPY WITH PROPOFOL;  Surgeon: Charolett Bumpers, MD;  [facility];  Service: Endoscopy;  Laterality: N/A;   CORONARY ARTERY BYPASS GRAFT     x6     Allergies  Allergies  Allergen Reactions   Piroxicam Itching and Rash    Home Medications    Prior to Admission medications   Medication Sig Start Date End Date Taking? Authorizing Provider  acetaminophen (TYLENOL) 325 MG tablet Take 2 tablets (650 mg total) by mouth every 4 (four) hours as needed for mild pain (or Fever >/= 101). 06/10/23   Lewie Chamber, MD  apixaban (ELIQUIS) 5 MG TABS tablet Take 1 tablet (5 mg total) by mouth 2 (two) times daily. 12/07/22   Wendall Stade, MD  atorvastatin (LIPITOR) 40 MG tablet TAKE 1 TABLET BY MOUTH EVERY DAY 03/04/23   Wendall Stade, MD  budesonide-formoterol (SYMBICORT) 160-4.5 MCG/ACT inhaler TAKE 2 PUFFS BY MOUTH TWICE A DAY Patient taking differently: Inhale 2 puffs into the  lungs 2 (two) times daily. 09/18/21   Nyoka Cowden, MD  carboxymethylcellulose (REFRESH PLUS) 0.5 % SOLN Use as needed for dry eye    [provider]  Cholecalciferol (VITAMIN D) 50 MCG (2000 UT) tablet Take 2,000 Units by mouth daily.     [provider]  cycloSPORINE (RESTASIS) 0.05 % ophthalmic emulsion Place 1 drop into both eyes 2 (two) times daily.    [provider]  diltiazem (CARDIZEM CD) 240 MG 24 hr capsule TAKE 1 CAPSULE BY MOUTH EVERY DAY Patient taking differently: Take 240 mg by mouth daily. 05/14/23   Wendall Stade, MD  escitalopram (LEXAPRO) 20 MG tablet Take 20 mg by mouth daily.    [provider]  Eyelid Cleansers (OCUSOFT EYELID CLEANSING) PADS Apply topically as needed. Use daily    [provider]  ezetimibe (ZETIA) 10 MG tablet TAKE 1 TABLET BY MOUTH EVERY DAY Patient taking differently: Take 10 mg by mouth daily. 03/04/23   Wendall Stade, MD  Ferrous Sulfate (SLOW FE) 142 (45 Fe) MG TBCR 1 tablet Orally Once a day    [provider]  latanoprost (XALATAN) 0.005 % ophthalmic solution Place 1 drop into both eyes at bedtime.    [provider]  losartan (COZAAR) 25 MG tablet  Take 25 mg by mouth daily.    [provider]  metoprolol tartrate (LOPRESSOR) 50 MG tablet TAKE 1 TABLET BY MOUTH TWICE A DAY 01/09/23   Wendall Stade, MD  montelukast (SINGULAIR) 10 MG tablet TAKE ONE TABLET BY MOUTH AT BEDTIME EVERY NIGHT Patient taking differently: Take 10 mg by mouth at bedtime. 02/18/23   Nyoka Cowden, MD  Multiple Vitamins-Minerals (CENTRUM SILVER PO) Take 1 tablet by mouth daily.    [provider]  nitroGLYCERIN (NITROSTAT) 0.4 MG SL tablet Place 1 tablet (0.4 mg total) under the tongue every 5 (five) minutes as needed for chest pain. 01/12/21   Wendall Stade, MD  sulfaSALAzine (AZULFIDINE) 500 MG tablet Take 1,000 mg by mouth 4 (four) times daily.    [provider]  VENTOLIN HFA 108 (90 Base) MCG/ACT inhaler INHALE 2 PUFFS INTO THE LUNGS EVERY 4 HRS AS NEEDED FOR WHEEZING OR SHORTNESS OF BREATH Patient taking differently: Inhale 2 puffs into the lungs every 4 (four) hours as needed for wheezing or shortness of breath. 03/19/19   Parrett, Virgel Bouquet, NP    Physical Exam    Vital Signs:  MARQUON ALCALA does not have vital signs available for review today.  Given telephonic nature of communication, physical exam is limited. AAOx3. NAD. Normal affect.  Speech and respirations are unlabored.  Accessory Clinical Findings    None  Assessment & Plan    1.  Preoperative Cardiovascular Risk Assessment:  Mr. Gaunt perioperative risk of a major cardiac event is 6.6% according to the Revised Cardiac Risk Index (RCRI).  Therefore, he is at high risk for perioperative complications.   His functional capacity is poor at 3.63 METs according to the [place] Activity Status Index (DASI). Recommendations: The patient is at high risk for perioperative cardiac complications and is at a low functional capacity.  However, further testing will not change how his cardiac status is managed.  Proceed with surgery at high risk if there are no other options for  treatment. Antiplatelet and/or Anticoagulation Recommendations:  Eliquis (Apixaban) can be held for 3 days prior to surgery.  Please resume post op when felt to be safe.  The patient was advised that if he develops new symptoms prior to surgery to contact our office to arrange for a follow-up visit, and he verbalized understanding.   A copy of this note will be routed to requesting surgeon.  Time:   Today, I have spent 30 minutes with the patient with telehealth technology discussing medical history, symptoms, and management plan.     Sharlene Dory, PA-C  06/26/2023, 2:23 PM

## 2023-06-26 NOTE — Telephone Encounter (Signed)
Reached pt's Barry [name], as emergency contact for pt.  Pt has been scheduled for a tele visit, today, 06/26/23 2:20.  They will call [name]'s phone, as pt is in a facility.  She will be with pt to do tele appointment for pt to have his procedure 06/28/23.

## 2023-06-27 DIAGNOSIS — Z9181 History of falling: Secondary | ICD-10-CM | POA: Diagnosis not present

## 2023-06-27 DIAGNOSIS — M6281 Muscle weakness (generalized): Secondary | ICD-10-CM | POA: Diagnosis not present

## 2023-06-27 DIAGNOSIS — I480 Paroxysmal atrial fibrillation: Secondary | ICD-10-CM | POA: Diagnosis not present

## 2023-06-27 DIAGNOSIS — N329 Bladder disorder, unspecified: Secondary | ICD-10-CM | POA: Diagnosis not present

## 2023-06-27 DIAGNOSIS — R2689 Other abnormalities of gait and mobility: Secondary | ICD-10-CM | POA: Diagnosis not present

## 2023-06-27 DIAGNOSIS — J4550 Severe persistent asthma, uncomplicated: Secondary | ICD-10-CM | POA: Diagnosis not present

## 2023-06-27 NOTE — Anesthesia Preprocedure Evaluation (Addendum)
Anesthesia Evaluation  Patient identified by MRN, date of birth, ID band Patient awake    Reviewed: Allergy & Precautions, H&P , NPO status , Patient's Chart, lab work & pertinent test results  Airway Mallampati: III   Neck ROM: limited    Dental   Pulmonary asthma    breath sounds clear to auscultation       Cardiovascular hypertension, + CAD and + CABG  + dysrhythmias Atrial Fibrillation  Rhythm:irregular Rate:Normal     Neuro/Psych  Neuromuscular disease    GI/Hepatic PUD,GERD  ,,  Endo/Other    Renal/GU      Musculoskeletal  (+) Arthritis ,    Abdominal   Peds  Hematology   Anesthesia Other Findings   Reproductive/Obstetrics                             Anesthesia Physical Anesthesia Plan  ASA: 3  Anesthesia Plan: General   Post-op Pain Management:    Induction: Intravenous  PONV Risk Score and Plan: 2 and Ondansetron, Dexamethasone and Treatment may vary due to age or medical condition  Airway Management Planned: LMA  Additional Equipment:   Intra-op Plan:   Post-operative Plan: Extubation in OR  Informed Consent: I have reviewed the patients History and Physical, chart, labs and discussed the procedure including the risks, benefits and alternatives for the proposed anesthesia with the patient or authorized representative who has indicated his/her understanding and acceptance.     Dental advisory given  Plan Discussed with: CRNA, Anesthesiologist and Surgeon  Anesthesia Plan Comments: (See PAT note 06/19/2023)       Anesthesia Quick Evaluation

## 2023-06-28 ENCOUNTER — Inpatient Hospital Stay (HOSPITAL_COMMUNITY)
Admission: RE | Admit: 2023-06-28 | Discharge: 2023-07-02 | DRG: 663 | Disposition: A | Discharge status: Skilled Nursing Facility | Payer: Medicare Other | Source: Skilled Nursing Facility | Attending: Urology | Admitting: Urology

## 2023-06-28 ENCOUNTER — Encounter (HOSPITAL_COMMUNITY): Payer: Self-pay | Admitting: Urology

## 2023-06-28 ENCOUNTER — Ambulatory Visit (HOSPITAL_BASED_OUTPATIENT_CLINIC_OR_DEPARTMENT_OTHER): Payer: Medicare Other | Admitting: Physician Assistant

## 2023-06-28 ENCOUNTER — Other Ambulatory Visit: Payer: Self-pay

## 2023-06-28 ENCOUNTER — Ambulatory Visit (HOSPITAL_COMMUNITY): Payer: Medicare Other | Admitting: Physician Assistant

## 2023-06-28 ENCOUNTER — Encounter (HOSPITAL_COMMUNITY)
Admission: RE | Disposition: A | Discharge status: Skilled Nursing Facility | Payer: Self-pay | Source: Skilled Nursing Facility | Attending: Urology

## 2023-06-28 DIAGNOSIS — C678 Malignant neoplasm of overlapping sites of bladder: Secondary | ICD-10-CM

## 2023-06-28 DIAGNOSIS — Z888 Allergy status to other drugs, medicaments and biological substances status: Secondary | ICD-10-CM

## 2023-06-28 DIAGNOSIS — R41841 Cognitive communication deficit: Secondary | ICD-10-CM | POA: Diagnosis not present

## 2023-06-28 DIAGNOSIS — Z8711 Personal history of peptic ulcer disease: Secondary | ICD-10-CM | POA: Diagnosis not present

## 2023-06-28 DIAGNOSIS — I4821 Permanent atrial fibrillation: Secondary | ICD-10-CM | POA: Diagnosis not present

## 2023-06-28 DIAGNOSIS — K219 Gastro-esophageal reflux disease without esophagitis: Secondary | ICD-10-CM | POA: Diagnosis present

## 2023-06-28 DIAGNOSIS — Z79899 Other long term (current) drug therapy: Secondary | ICD-10-CM

## 2023-06-28 DIAGNOSIS — Z7951 Long term (current) use of inhaled steroids: Secondary | ICD-10-CM

## 2023-06-28 DIAGNOSIS — M6281 Muscle weakness (generalized): Secondary | ICD-10-CM | POA: Diagnosis not present

## 2023-06-28 DIAGNOSIS — Z6835 Body mass index (BMI) 35.0-35.9, adult: Secondary | ICD-10-CM | POA: Diagnosis not present

## 2023-06-28 DIAGNOSIS — Z66 Do not resuscitate: Secondary | ICD-10-CM | POA: Diagnosis present

## 2023-06-28 DIAGNOSIS — D649 Anemia, unspecified: Secondary | ICD-10-CM | POA: Diagnosis not present

## 2023-06-28 DIAGNOSIS — R2681 Unsteadiness on feet: Secondary | ICD-10-CM | POA: Diagnosis not present

## 2023-06-28 DIAGNOSIS — E785 Hyperlipidemia, unspecified: Secondary | ICD-10-CM | POA: Diagnosis not present

## 2023-06-28 DIAGNOSIS — Z8249 Family history of ischemic heart disease and other diseases of the circulatory system: Secondary | ICD-10-CM

## 2023-06-28 DIAGNOSIS — D494 Neoplasm of unspecified behavior of bladder: Secondary | ICD-10-CM | POA: Diagnosis not present

## 2023-06-28 DIAGNOSIS — C679 Malignant neoplasm of bladder, unspecified: Secondary | ICD-10-CM | POA: Diagnosis not present

## 2023-06-28 DIAGNOSIS — B9689 Other specified bacterial agents as the cause of diseases classified elsewhere: Secondary | ICD-10-CM | POA: Diagnosis not present

## 2023-06-28 DIAGNOSIS — J45909 Unspecified asthma, uncomplicated: Secondary | ICD-10-CM | POA: Diagnosis not present

## 2023-06-28 DIAGNOSIS — Z7901 Long term (current) use of anticoagulants: Secondary | ICD-10-CM

## 2023-06-28 DIAGNOSIS — Z841 Family history of disorders of kidney and ureter: Secondary | ICD-10-CM

## 2023-06-28 DIAGNOSIS — C680 Malignant neoplasm of urethra: Secondary | ICD-10-CM | POA: Diagnosis not present

## 2023-06-28 DIAGNOSIS — I4891 Unspecified atrial fibrillation: Secondary | ICD-10-CM | POA: Diagnosis not present

## 2023-06-28 DIAGNOSIS — D62 Acute posthemorrhagic anemia: Secondary | ICD-10-CM | POA: Diagnosis not present

## 2023-06-28 DIAGNOSIS — I2510 Atherosclerotic heart disease of native coronary artery without angina pectoris: Secondary | ICD-10-CM | POA: Diagnosis present

## 2023-06-28 DIAGNOSIS — E669 Obesity, unspecified: Secondary | ICD-10-CM | POA: Diagnosis present

## 2023-06-28 DIAGNOSIS — F05 Delirium due to known physiological condition: Secondary | ICD-10-CM | POA: Diagnosis not present

## 2023-06-28 DIAGNOSIS — I480 Paroxysmal atrial fibrillation: Secondary | ICD-10-CM | POA: Diagnosis not present

## 2023-06-28 DIAGNOSIS — R1312 Dysphagia, oropharyngeal phase: Secondary | ICD-10-CM | POA: Diagnosis not present

## 2023-06-28 DIAGNOSIS — N200 Calculus of kidney: Secondary | ICD-10-CM | POA: Diagnosis present

## 2023-06-28 DIAGNOSIS — Z7401 Bed confinement status: Secondary | ICD-10-CM | POA: Diagnosis not present

## 2023-06-28 DIAGNOSIS — E66812 Obesity, class 2: Secondary | ICD-10-CM | POA: Diagnosis present

## 2023-06-28 DIAGNOSIS — D4959 Neoplasm of unspecified behavior of other genitourinary organ: Secondary | ICD-10-CM | POA: Diagnosis not present

## 2023-06-28 DIAGNOSIS — I10 Essential (primary) hypertension: Secondary | ICD-10-CM | POA: Diagnosis present

## 2023-06-28 DIAGNOSIS — Z951 Presence of aortocoronary bypass graft: Secondary | ICD-10-CM

## 2023-06-28 DIAGNOSIS — N3289 Other specified disorders of bladder: Secondary | ICD-10-CM | POA: Diagnosis not present

## 2023-06-28 DIAGNOSIS — N138 Other obstructive and reflux uropathy: Secondary | ICD-10-CM | POA: Diagnosis not present

## 2023-06-28 DIAGNOSIS — K5190 Ulcerative colitis, unspecified, without complications: Secondary | ICD-10-CM | POA: Diagnosis not present

## 2023-06-28 DIAGNOSIS — R310 Gross hematuria: Secondary | ICD-10-CM | POA: Diagnosis present

## 2023-06-28 DIAGNOSIS — Z483 Aftercare following surgery for neoplasm: Secondary | ICD-10-CM | POA: Diagnosis not present

## 2023-06-28 DIAGNOSIS — N390 Urinary tract infection, site not specified: Secondary | ICD-10-CM | POA: Diagnosis not present

## 2023-06-28 DIAGNOSIS — R531 Weakness: Secondary | ICD-10-CM | POA: Diagnosis not present

## 2023-06-28 DIAGNOSIS — I959 Hypotension, unspecified: Secondary | ICD-10-CM | POA: Diagnosis not present

## 2023-06-28 DIAGNOSIS — H409 Unspecified glaucoma: Secondary | ICD-10-CM | POA: Diagnosis present

## 2023-06-28 DIAGNOSIS — R339 Retention of urine, unspecified: Secondary | ICD-10-CM | POA: Diagnosis not present

## 2023-06-28 DIAGNOSIS — M1990 Unspecified osteoarthritis, unspecified site: Secondary | ICD-10-CM | POA: Diagnosis not present

## 2023-06-28 HISTORY — PX: TRANSURETHRAL RESECTION OF BLADDER TUMOR: SHX2575

## 2023-06-28 HISTORY — DX: Other specified disorders of bladder: N32.89

## 2023-06-28 HISTORY — DX: Anemia, unspecified: D64.9

## 2023-06-28 LAB — PHOSPHORUS: Phosphorus: 3.9 mg/dL (ref 2.5–4.6)

## 2023-06-28 LAB — CBC
HCT: 36.3 % — ABNORMAL LOW (ref 39.0–52.0)
HCT: 34.2 % — ABNORMAL LOW (ref 39.0–52.0)
Hemoglobin: 10.8 g/dL — ABNORMAL LOW (ref 13.0–17.0)
Hemoglobin: 10.1 g/dL — ABNORMAL LOW (ref 13.0–17.0)
MCH: 27.7 pg (ref 26.0–34.0)
MCH: 27.5 pg (ref 26.0–34.0)
MCHC: 29.8 g/dL — ABNORMAL LOW (ref 30.0–36.0)
MCHC: 29.5 g/dL — ABNORMAL LOW (ref 30.0–36.0)
MCV: 93.1 fL (ref 80.0–100.0)
MCV: 93.2 fL (ref 80.0–100.0)
Platelets: 295 10*3/uL (ref 150–400)
Platelets: 328 10*3/uL (ref 150–400)
RBC: 3.9 MIL/uL — ABNORMAL LOW (ref 4.22–5.81)
RBC: 3.67 MIL/uL — ABNORMAL LOW (ref 4.22–5.81)
RDW: 17.5 % — ABNORMAL HIGH (ref 11.5–15.5)
RDW: 17.3 % — ABNORMAL HIGH (ref 11.5–15.5)
WBC: 3.9 10*3/uL — ABNORMAL LOW (ref 4.0–10.5)
WBC: 4.1 10*3/uL (ref 4.0–10.5)
nRBC: 0 % (ref 0.0–0.2)
nRBC: 0 % (ref 0.0–0.2)

## 2023-06-28 LAB — PREPARE RBC (CROSSMATCH)

## 2023-06-28 LAB — COMPREHENSIVE METABOLIC PANEL
ALT: 41 U/L (ref 0–44)
AST: 35 U/L (ref 15–41)
Albumin: 2.4 g/dL — ABNORMAL LOW (ref 3.5–5.0)
Alkaline Phosphatase: 76 U/L (ref 38–126)
Anion gap: 9 (ref 5–15)
BUN: 25 mg/dL — ABNORMAL HIGH (ref 8–23)
CO2: 20 mmol/L — ABNORMAL LOW (ref 22–32)
Calcium: 8.3 mg/dL — ABNORMAL LOW (ref 8.9–10.3)
Chloride: 108 mmol/L (ref 98–111)
Creatinine, Ser: 0.82 mg/dL (ref 0.61–1.24)
GFR, Estimated: >60 mL/min (ref >60)
Glucose, Bld: 139 mg/dL — ABNORMAL HIGH (ref 70–99)
Potassium: 4.3 mmol/L (ref 3.5–5.1)
Sodium: 137 mmol/L (ref 135–145)
Total Bilirubin: 0.3 mg/dL (ref 0.3–1.2)
Total Protein: 5.5 g/dL — ABNORMAL LOW (ref 6.5–8.1)

## 2023-06-28 LAB — HEMOGLOBIN AND HEMATOCRIT, BLOOD
HCT: 34.7 % — ABNORMAL LOW (ref 39.0–52.0)
HCT: 35.4 % — ABNORMAL LOW (ref 39.0–52.0)
HCT: 24 % — ABNORMAL LOW (ref 39.0–52.0)
Hemoglobin: 9.9 g/dL — ABNORMAL LOW (ref 13.0–17.0)
Hemoglobin: 9.2 g/dL — ABNORMAL LOW (ref 13.0–17.0)
Hemoglobin: 7 g/dL — ABNORMAL LOW (ref 13.0–17.0)

## 2023-06-28 LAB — GLUCOSE, CAPILLARY: Glucose-Capillary: 126 mg/dL — ABNORMAL HIGH (ref 70–99)

## 2023-06-28 LAB — MAGNESIUM: Magnesium: 1.8 mg/dL (ref 1.7–2.4)

## 2023-06-28 LAB — MRSA NEXT GEN BY PCR, NASAL: MRSA by PCR Next Gen: NOT DETECTED

## 2023-06-28 SURGERY — TURBT (TRANSURETHRAL RESECTION OF BLADDER TUMOR)
Anesthesia: General

## 2023-06-28 MED ORDER — OXYCODONE HCL 5 MG/5ML PO SOLN: 5.0000 mg | Freq: Once | ORAL | Status: DC | PRN

## 2023-06-28 MED ORDER — LIDOCAINE HCL (PF) 2 % IJ SOLN
INTRAMUSCULAR | Status: AC
  Filled 2023-06-28: qty 5

## 2023-06-28 MED ORDER — POLYVINYL ALCOHOL 1.4 % OP SOLN: 1.0000 [drp] | Freq: Every day | OPHTHALMIC | Status: DC | PRN

## 2023-06-28 MED ORDER — NITROGLYCERIN 0.4 MG SL SUBL: 0.4000 mg | SUBLINGUAL_TABLET | SUBLINGUAL | Status: DC | PRN

## 2023-06-28 MED ORDER — SUGAMMADEX SODIUM 200 MG/2ML IV SOLN
INTRAVENOUS | Status: DC | PRN
  Administered 2023-06-28: 200 mg via INTRAVENOUS

## 2023-06-28 MED ORDER — 0.9 % SODIUM CHLORIDE (POUR BTL) OPTIME
TOPICAL | Status: DC | PRN
  Administered 2023-06-28: 1000 mL

## 2023-06-28 MED ORDER — FENTANYL CITRATE (PF) 250 MCG/5ML IJ SOLN
INTRAMUSCULAR | Status: DC | PRN
  Administered 2023-06-28 (×2): 50 ug via INTRAVENOUS
  Administered 2023-06-28: 25 ug via INTRAVENOUS
  Administered 2023-06-28: 50 ug via INTRAVENOUS
  Administered 2023-06-28: 25 ug via INTRAVENOUS

## 2023-06-28 MED ORDER — ESCITALOPRAM OXALATE 20 MG PO TABS
20.0000 mg | ORAL_TABLET | Freq: Every day | ORAL | Status: DC
  Administered 2023-06-28 – 2023-07-02 (×5): 20 mg via ORAL
  Filled 2023-06-28 (×5): qty 1

## 2023-06-28 MED ORDER — CEFAZOLIN SODIUM-DEXTROSE 2-4 GM/100ML-% IV SOLN
2.0000 g | INTRAVENOUS | Status: AC
  Administered 2023-06-28: 2 g via INTRAVENOUS
  Filled 2023-06-28: qty 100

## 2023-06-28 MED ORDER — CEFAZOLIN SODIUM-DEXTROSE 2-4 GM/100ML-% IV SOLN
2.0000 g | Freq: Three times a day (TID) | INTRAVENOUS | Status: DC
  Administered 2023-06-28 – 2023-07-02 (×12): 2 g via INTRAVENOUS
  Filled 2023-06-28 (×12): qty 100

## 2023-06-28 MED ORDER — PROPOFOL 10 MG/ML IV BOLUS
INTRAVENOUS | Status: AC
  Filled 2023-06-28: qty 20

## 2023-06-28 MED ORDER — MAGNESIUM SULFATE 2 GM/50ML IV SOLN
2.0000 g | Freq: Once | INTRAVENOUS | Status: AC
  Administered 2023-06-28: 2 g via INTRAVENOUS
  Filled 2023-06-28: qty 50

## 2023-06-28 MED ORDER — MONTELUKAST SODIUM 10 MG PO TABS
10.0000 mg | ORAL_TABLET | Freq: Every day | ORAL | Status: DC
  Administered 2023-06-28 – 2023-07-01 (×4): 10 mg via ORAL
  Filled 2023-06-28 (×4): qty 1

## 2023-06-28 MED ORDER — CYCLOSPORINE 0.05 % OP EMUL
1.0000 [drp] | Freq: Two times a day (BID) | OPHTHALMIC | Status: DC
  Administered 2023-06-28 – 2023-07-02 (×8): 1 [drp] via OPHTHALMIC
  Filled 2023-06-28 (×9): qty 30

## 2023-06-28 MED ORDER — METOPROLOL TARTRATE 5 MG/5ML IV SOLN
INTRAVENOUS | Status: AC
  Filled 2023-06-28: qty 5

## 2023-06-28 MED ORDER — DEXAMETHASONE SODIUM PHOSPHATE 10 MG/ML IJ SOLN
INTRAMUSCULAR | Status: DC | PRN
  Administered 2023-06-28: 4 mg via INTRAVENOUS

## 2023-06-28 MED ORDER — OXYCODONE HCL 5 MG PO TABS
5.0000 mg | ORAL_TABLET | ORAL | Status: DC | PRN
  Administered 2023-06-29 – 2023-06-30 (×2): 5 mg via ORAL
  Filled 2023-06-28 (×2): qty 1

## 2023-06-28 MED ORDER — ALBUMIN HUMAN 25 % IV SOLN
50.0000 g | Freq: Once | INTRAVENOUS | Status: AC
  Administered 2023-06-28: 50 g via INTRAVENOUS
  Filled 2023-06-28: qty 200

## 2023-06-28 MED ORDER — AMIODARONE IV BOLUS ONLY 150 MG/100ML
150.0000 mg | Freq: Once | INTRAVENOUS | Status: AC
  Administered 2023-06-28: 150 mg via INTRAVENOUS
  Filled 2023-06-28: qty 100

## 2023-06-28 MED ORDER — POTASSIUM CHLORIDE IN NACL 20-0.45 MEQ/L-% IV SOLN
INTRAVENOUS | Status: DC
  Filled 2023-06-28 (×5): qty 1000

## 2023-06-28 MED ORDER — OXYCODONE HCL 5 MG PO TABS: 5.0000 mg | ORAL_TABLET | Freq: Once | ORAL | Status: DC | PRN

## 2023-06-28 MED ORDER — ESMOLOL HCL 100 MG/10ML IV SOLN
INTRAVENOUS | Status: DC | PRN
  Administered 2023-06-28: 20 mg via INTRAVENOUS

## 2023-06-28 MED ORDER — ESMOLOL HCL 100 MG/10ML IV SOLN
INTRAVENOUS | Status: AC
  Filled 2023-06-28: qty 10

## 2023-06-28 MED ORDER — FLEET ENEMA 7-19 GM/118ML RE ENEM: 1.0000 | ENEMA | Freq: Once | RECTAL | Status: DC | PRN

## 2023-06-28 MED ORDER — CHLORHEXIDINE GLUCONATE CLOTH 2 % EX PADS
6.0000 | MEDICATED_PAD | Freq: Every day | CUTANEOUS | Status: DC
  Administered 2023-06-28 – 2023-07-02 (×5): 6 via TOPICAL

## 2023-06-28 MED ORDER — HYDROMORPHONE HCL 1 MG/ML IJ SOLN
0.5000 mg | INTRAMUSCULAR | Status: DC | PRN
  Administered 2023-06-28 – 2023-06-29 (×2): 1 mg via INTRAVENOUS
  Filled 2023-06-28: qty 1

## 2023-06-28 MED ORDER — METOPROLOL TARTRATE 50 MG PO TABS
50.0000 mg | ORAL_TABLET | Freq: Two times a day (BID) | ORAL | Status: DC
  Administered 2023-06-28 – 2023-07-02 (×8): 50 mg via ORAL
  Filled 2023-06-28 (×3): qty 2
  Filled 2023-06-28 (×3): qty 1
  Filled 2023-06-28 (×2): qty 2
  Filled 2023-06-28: qty 1

## 2023-06-28 MED ORDER — ONDANSETRON HCL 4 MG/2ML IJ SOLN: 4.0000 mg | INTRAMUSCULAR | Status: DC | PRN

## 2023-06-28 MED ORDER — ONDANSETRON HCL 4 MG/2ML IJ SOLN
INTRAMUSCULAR | Status: AC
  Filled 2023-06-28: qty 2

## 2023-06-28 MED ORDER — ATORVASTATIN CALCIUM 40 MG PO TABS
40.0000 mg | ORAL_TABLET | Freq: Every day | ORAL | Status: DC
  Administered 2023-06-28 – 2023-07-02 (×5): 40 mg via ORAL
  Filled 2023-06-28 (×5): qty 1

## 2023-06-28 MED ORDER — LOSARTAN POTASSIUM 50 MG PO TABS
25.0000 mg | ORAL_TABLET | Freq: Every day | ORAL | Status: DC
  Administered 2023-06-29 – 2023-07-02 (×4): 25 mg via ORAL
  Filled 2023-06-28 (×4): qty 1

## 2023-06-28 MED ORDER — DILTIAZEM HCL ER COATED BEADS 120 MG PO CP24
240.0000 mg | ORAL_CAPSULE | Freq: Every day | ORAL | Status: DC
  Filled 2023-06-28: qty 2

## 2023-06-28 MED ORDER — DILTIAZEM HCL-DEXTROSE 125-5 MG/125ML-% IV SOLN (PREMIX)
5.0000 mg/h | INTRAVENOUS | Status: DC
  Administered 2023-06-28: 5 mg/h via INTRAVENOUS
  Administered 2023-06-29: 10 mg/h via INTRAVENOUS
  Filled 2023-06-28 (×2): qty 125

## 2023-06-28 MED ORDER — FENTANYL CITRATE (PF) 100 MCG/2ML IJ SOLN
INTRAMUSCULAR | Status: AC
  Filled 2023-06-28: qty 2

## 2023-06-28 MED ORDER — FENTANYL CITRATE PF 50 MCG/ML IJ SOSY
PREFILLED_SYRINGE | INTRAMUSCULAR | Status: AC
  Filled 2023-06-28: qty 1

## 2023-06-28 MED ORDER — ORAL CARE MOUTH RINSE: 15.0000 mL | OROMUCOSAL | Status: DC | PRN

## 2023-06-28 MED ORDER — SODIUM CHLORIDE 0.9 % IR SOLN
Status: DC | PRN
  Administered 2023-06-28: 54000 mL

## 2023-06-28 MED ORDER — LIDOCAINE HCL (CARDIAC) PF 100 MG/5ML IV SOSY
PREFILLED_SYRINGE | INTRAVENOUS | Status: DC | PRN
  Administered 2023-06-28: 60 mg via INTRAVENOUS

## 2023-06-28 MED ORDER — DILTIAZEM HCL 25 MG/5ML IV SOLN
INTRAVENOUS | Status: AC
  Filled 2023-06-28: qty 5

## 2023-06-28 MED ORDER — CHLORHEXIDINE GLUCONATE 0.12 % MT SOLN
15.0000 mL | Freq: Once | OROMUCOSAL | Status: AC
  Administered 2023-06-28: 15 mL via OROMUCOSAL

## 2023-06-28 MED ORDER — ONDANSETRON HCL 4 MG/2ML IJ SOLN: 4.0000 mg | Freq: Four times a day (QID) | INTRAMUSCULAR | Status: DC | PRN

## 2023-06-28 MED ORDER — DILTIAZEM HCL 25 MG/5ML IV SOLN
10.0000 mg | Freq: Once | INTRAVENOUS | Status: AC
  Administered 2023-06-28: 10 mg via INTRAVENOUS

## 2023-06-28 MED ORDER — ZOLPIDEM TARTRATE 5 MG PO TABS
5.0000 mg | ORAL_TABLET | Freq: Every evening | ORAL | Status: DC | PRN
  Administered 2023-06-29: 5 mg via ORAL
  Filled 2023-06-28: qty 1

## 2023-06-28 MED ORDER — SULFASALAZINE 500 MG PO TABS
1000.0000 mg | ORAL_TABLET | Freq: Four times a day (QID) | ORAL | Status: DC
  Administered 2023-06-28 – 2023-07-02 (×15): 1000 mg via ORAL
  Filled 2023-06-28 (×20): qty 2

## 2023-06-28 MED ORDER — FENTANYL CITRATE PF 50 MCG/ML IJ SOSY
PREFILLED_SYRINGE | INTRAMUSCULAR | Status: AC
  Filled 2023-06-28: qty 2

## 2023-06-28 MED ORDER — BISACODYL 10 MG RE SUPP: 10.0000 mg | Freq: Every day | RECTAL | Status: DC | PRN

## 2023-06-28 MED ORDER — ORAL CARE MOUTH RINSE: 15.0000 mL | Freq: Once | OROMUCOSAL | Status: AC

## 2023-06-28 MED ORDER — ALBUTEROL SULFATE (2.5 MG/3ML) 0.083% IN NEBU: 2.5000 mg | INHALATION_SOLUTION | RESPIRATORY_TRACT | Status: DC | PRN

## 2023-06-28 MED ORDER — EZETIMIBE 10 MG PO TABS
10.0000 mg | ORAL_TABLET | Freq: Every day | ORAL | Status: DC
  Administered 2023-06-28 – 2023-07-02 (×5): 10 mg via ORAL
  Filled 2023-06-28 (×5): qty 1

## 2023-06-28 MED ORDER — SODIUM CHLORIDE 0.9 % IV SOLN: 10.0000 mL/h | Freq: Once | INTRAVENOUS | Status: DC

## 2023-06-28 MED ORDER — SODIUM CHLORIDE 0.9 % IV BOLUS
250.0000 mL | Freq: Once | INTRAVENOUS | Status: AC
  Administered 2023-06-28: 250 mL via INTRAVENOUS

## 2023-06-28 MED ORDER — LATANOPROST 0.005 % OP SOLN
1.0000 [drp] | Freq: Every day | OPHTHALMIC | Status: DC
  Administered 2023-06-28 – 2023-07-01 (×4): 1 [drp] via OPHTHALMIC
  Filled 2023-06-28: qty 2.5

## 2023-06-28 MED ORDER — ROCURONIUM BROMIDE 10 MG/ML (PF) SYRINGE
PREFILLED_SYRINGE | INTRAVENOUS | Status: DC | PRN
  Administered 2023-06-28: 50 mg via INTRAVENOUS
  Administered 2023-06-28: 20 mg via INTRAVENOUS
  Administered 2023-06-28: 30 mg via INTRAVENOUS

## 2023-06-28 MED ORDER — SODIUM CHLORIDE 0.9 % IV BOLUS
500.0000 mL | Freq: Once | INTRAVENOUS | Status: AC
  Administered 2023-06-28: 500 mL via INTRAVENOUS

## 2023-06-28 MED ORDER — ACETAMINOPHEN 325 MG PO TABS: 650.0000 mg | ORAL_TABLET | ORAL | Status: DC | PRN

## 2023-06-28 MED ORDER — LACTATED RINGERS IV SOLN: INTRAVENOUS | Status: DC

## 2023-06-28 MED ORDER — SENNOSIDES-DOCUSATE SODIUM 8.6-50 MG PO TABS: 1.0000 | ORAL_TABLET | Freq: Every evening | ORAL | Status: DC | PRN

## 2023-06-28 MED ORDER — HYDROMORPHONE HCL 1 MG/ML IJ SOLN
INTRAMUSCULAR | Status: AC
  Filled 2023-06-28: qty 1

## 2023-06-28 MED ORDER — SODIUM CHLORIDE 0.9% IV SOLUTION: Freq: Once | INTRAVENOUS | Status: DC

## 2023-06-28 MED ORDER — DEXAMETHASONE SODIUM PHOSPHATE 10 MG/ML IJ SOLN
INTRAMUSCULAR | Status: AC
  Filled 2023-06-28: qty 1

## 2023-06-28 MED ORDER — PROPOFOL 10 MG/ML IV BOLUS
INTRAVENOUS | Status: DC | PRN
Start: 2023-06-28 — End: 2023-06-28
  Administered 2023-06-28: 140 mg via INTRAVENOUS

## 2023-06-28 MED ORDER — ONDANSETRON HCL 4 MG/2ML IJ SOLN
INTRAMUSCULAR | Status: DC | PRN
Start: 2023-06-28 — End: 2023-06-28
  Administered 2023-06-28: 4 mg via INTRAVENOUS

## 2023-06-28 MED ORDER — PANTOPRAZOLE SODIUM 40 MG PO TBEC
40.0000 mg | DELAYED_RELEASE_TABLET | Freq: Every day | ORAL | Status: DC
  Administered 2023-06-28 – 2023-07-02 (×5): 40 mg via ORAL
  Filled 2023-06-28 (×5): qty 1

## 2023-06-28 MED ORDER — METOPROLOL TARTRATE 5 MG/5ML IV SOLN
INTRAVENOUS | Status: DC | PRN
  Administered 2023-06-28: 2 mg via INTRAVENOUS

## 2023-06-28 MED ORDER — FENTANYL CITRATE PF 50 MCG/ML IJ SOSY
25.0000 ug | PREFILLED_SYRINGE | INTRAMUSCULAR | Status: DC | PRN
  Administered 2023-06-28 (×3): 50 ug via INTRAVENOUS

## 2023-06-28 SURGICAL SUPPLY — 25 items
BAG DRN RND TRDRP ANRFLXCHMBR (UROLOGICAL SUPPLIES) ×1
BAG URINE DRAIN 2000ML AR STRL (UROLOGICAL SUPPLIES) IMPLANT
BAG URO CATCHER STRL LF (MISCELLANEOUS) ×1 IMPLANT
CATH FOLEY 3WAY 30CC 22FR (CATHETERS) IMPLANT
CATH HEMA 3WAY 30CC 24FR COUDE (CATHETERS) IMPLANT
CATH URETL OPEN 5X70 (CATHETERS) IMPLANT
DRAPE FOOT SWITCH (DRAPES) ×1 IMPLANT
ELECT BIVAP BIPO 22/24 DONUT (ELECTROSURGICAL) ×1
ELECT REM PT RETURN 15FT ADLT (MISCELLANEOUS) ×1 IMPLANT
ELECTRD BIVAP BIPO 22/24 DONUT (ELECTROSURGICAL) IMPLANT
GLOVE SURG SS PI 8.0 STRL IVOR (GLOVE) ×1 IMPLANT
GOWN STRL REUS W/ TWL XL LVL3 (GOWN DISPOSABLE) ×1 IMPLANT
GOWN STRL REUS W/TWL XL LVL3 (GOWN DISPOSABLE) ×1
HOLDER FOLEY CATH W/STRAP (MISCELLANEOUS) IMPLANT
KIT TURNOVER KIT A (KITS) IMPLANT
LOOP CUT BIPOLAR 24F LRG (ELECTROSURGICAL) IMPLANT
MANIFOLD NEPTUNE II (INSTRUMENTS) ×1 IMPLANT
PACK CYSTO (CUSTOM PROCEDURE TRAY) ×1 IMPLANT
PLUG CATH AND CAP STER (CATHETERS) IMPLANT
SUT ETHILON 3 0 PS 1 (SUTURE) IMPLANT
SYR 30ML LL (SYRINGE) IMPLANT
SYR TOOMEY IRRIG 70ML (MISCELLANEOUS) ×1
SYRINGE TOOMEY IRRIG 70ML (MISCELLANEOUS) IMPLANT
TUBING CONNECTING 10 (TUBING) ×1 IMPLANT
TUBING UROLOGY SET (TUBING) ×1 IMPLANT

## 2023-06-28 NOTE — Op Note (Addendum)
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  Preop diagnosis: Extensive urothelial neoplasm.  Postop diagnosis: Same with involvement of the prostatic urethra in addition to the bladder.  Surgeon: Dr. Bjorn Pippin.  Anesthesia: General.  Specimen: Chips from prostate and bladder.  Drains: 21 French coud hematuria catheter.  EBL: 500 mL.  Complications: None.  Indications: The patient is a 77 year old male who was found on CT scan done for evaluation of abdominal discomfort and hematuria to have bladder wall findings suggestive of extensive urothelial neoplasm >10cm in greatest dimension lining the bulk of the bladder wall sparing only a portion of the dome.  He also was noted to have a solitary right kidney with 8 mm stone in the right renal pelvis but it was nonobstructing.  Because of his voiding difficulty it was felt that a palliative TURP was indicated.  Procedure: He was taken the operating room where he was given 2 g of Ancef.  A general anesthetic was induced.  He was intubated and paralyzed.  He was placed in lithotomy position and fitted with PAS hose.  His perineum and genitalia were prepped with Betadine solution he was draped in usual sterile fashion.  Cystoscopy was performed using a 21 [place] scope and 30 degree lens.  Examination really normal urethra.  The external sphincter was intact.  The prostatic urethra was approximately 3 to 4 cm in length with lateral lobe hyperplasia and the middle lobe.  There were papillary tumor fronds noted primarily on the right at the apex and proximal prostatic urethra as well as circumferentially around the bladder neck involving the middle lobe.  Inspection of the bladder demonstrated extensive papillary neoplasms with an old yellowish clot in the back of the bladder and some sparing of the mucosa on the posterior wall, dome and left lateral wall.  The cystoscope  was removed and the urethra was calibrated to 34 [place] with sounds.  A 26 French continuous-flow resectoscope sheath was inserted using the aid of the visual obturator.  The visual obturator was exchanged for an [place] handle with a bipolar loop and 30 degree lens.  Saline was used the irrigant.  The bladder was evacuated free of the old clot.  Initially the tumors from the apex and right proximal prostate were resected.  I then began to resect the bladder tumor.  I had to resect most of the prostatic middle lobe to get the tumor off of the of the middle lobe area.  I then resected around the bladder neck trying to achieve hemostasis as I proceeded.  He had some bleeding on the right lateral wall that was difficult to localize requiring much more extensive resection of the right lateral wall tumors.  Eventually I was able to find the arterial bleeder and cauterized that along with many other breathers during this procedure.  The tumor of course was very large and the resection continued for approximately an hour and a half.  Inspection of the right lateral wall did reveal some perforation to the fat but he did not appear to have any extravasation based on physical exam during the procedure eventually I got to the point where I could evacuate the chips from the bladder and achieve adequate hemostasis.  I did not complete the resection because of the extent of the tumor but I did adequately clear the prostatic urethra and bladder neck which should improve his voiding function.  Once the bladder been evacuated of chips  and clots, the resectoscope was removed once hemostasis was felt to be adequate and then I placed a 24 [place] coud tip hematuria catheter.  The catheter was irrigated with pink return and the final drips were only pink so I did not feel that he had significant active bleeding.  The balloon had been filled with 30 mL of sterile fluid.  The catheter irrigation port was plugged.  He was taken down  from lithotomy position, his anesthetic was reversed and he was moved to recovery in stable condition.  There were no complications but the procedure was difficult and prolonged.  There was significant bleeding so I had a type and cross done during the procedure pending postop hemoglobin check.  I also have consulted the hospitalist service to aid in medical management.

## 2023-06-28 NOTE — Anesthesia Procedure Notes (Signed)
Procedure Name: Intubation Date/Time: 06/28/2023 7:40 AM  Performed by: Oletha Cruel, CRNAPre-anesthesia Checklist: Patient identified, Emergency Drugs available, Suction available, Patient being monitored and Timeout performed Patient Re-evaluated:Patient Re-evaluated prior to induction Oxygen Delivery Method: Circle system utilized Preoxygenation: Pre-oxygenation with 100% oxygen Induction Type: IV induction Ventilation: Mask ventilation without difficulty Laryngoscope Size: Glidescope Grade View: Grade I Tube type: Oral Tube size: 7.5 mm Number of attempts: 1 Airway Equipment and Method: Stylet Placement Confirmation: ETT inserted through vocal cords under direct vision, positive ETCO2 and breath sounds checked- equal and bilateral Secured at: 22 cm Tube secured with: Tape Dental Injury: Teeth and Oropharynx as per pre-operative assessment

## 2023-06-28 NOTE — Consult Note (Signed)
Initial Consultation Note   Patient: Barry [name] ZOX:[id number] DOB: 1946/02/03 PCP: Georgann Housekeeper, MD DOA: 06/28/2023 DOS: the patient was seen and examined on 06/28/2023 Primary service: Bjorn Pippin, MD  Referring physician: Bjorn Pippin, MD. Reason for consult: Atrial fibrillation with RVR/medical management Assessment and Plan: Principal Problem:   Gross hematuria In the setting of:   Malignant neoplasm of overlapping sites of bladder (HCC) Observation/stepdown. CBI and postop care per urology. Hold apixaban. Monitor hematocrit and hemoglobin. Transfuse as needed.  Active Problems:   ABLA (acute blood loss anemia) Monitor hematocrit and hemoglobin. Will transfuse if hemoglobin less than 8 g/dL.    Paroxysmal atrial fibrillation with RVR (HCC) CHA2DS2-VASc Score of at least 5.  Currently hypotensive with SBP ranging from the 80s-100s. Did receive esmolol and metoprolol IVP by anesthesia. Cardizem 10 mg IVP given earlier with partial results. He is unable to take his home diltiazem and metoprolol. NS 500 mL bolus plus amiodarone 150 mg IVP x 1 dose ordered DOAC held due to active bleed. Will keep electrolytes optimized.    CAD (coronary artery disease), native coronary artery Holding apixaban and antihypertensives. Continuing daily statin.    Benign essential HTN Holding antihypertensives. Monitor blood pressure.    Hyperlipidemia LDL goal <70 Continue atorvastatin 40 mg p.o. daily.    GASTROESOPHAGEAL REFLUX DISEASE Antiacid, H2 blocker or PPI as needed.    ULCERATIVE COLITIS Continue sulfasalazine 1000 mg 4 times daily.    Class 2 obesity Current BMI 35.62 kg/m. Lifestyle modifications.    TRH will continue to follow the patient.  HPI: Barry [name] is a 77 y.o. male with past medical history of paroxysmal atrial fibrillation, history of acute sinusitis, asthma, history of acute bronchitis, CAD, CABG, cataracts, chronic cough, chronic rhinitis, GERD,  glaucoma, hyperlipidemia, hypertension, class III obesity, osteoarthritis, left side paralyzed hemidiaphragm, retinal disorder, ulcerative colitis who is familiar to me as I admitted him 25 days ago due to complicated UTI with bladder mass and lower GI bleeding in the setting of ulcerative colitis who underwent TURBT with Dr. Annabell Howells earlier today and we are being consulted for medical management. He is having some lower abdomen tenderness, but no nausea or emesis.  No flank pain or dysuria, positive hematuria. No fever, chills, rhinorrhea, sore throat, wheezing or hemoptysis.  No chest pain, palpitations, diaphoresis, PND, orthopnea or pitting edema of the lower extremities, but is feeling dyspneic.  No recent diarrhea, constipation, melena or hematochezia.   No polyuria, polydipsia, polyphagia or blurred vision.   Lab work: CBC showed a white count of 3.9 and preop hemoglobin of 10.8 g/dL platelets 811.  His first postop hemoglobin level was 9.9 g/dL.  Imaging: 06/03/2023 CT abdomen/pelvis with contrast with multiple mass lesions along the bladder lumen, nonobstructing right-sided nephrolithiasis and absence of the left kidney.  Review of Systems: As mentioned in the history of present illness. All other systems reviewed and are negative. Past Medical History:  Diagnosis Date   A-fib [facility])    Acute sinusitis 03/24/2012   Followed in Pulmonary clinic/ [facility]/ [name] - CT sinus 08/07/2013  Short air-fluid levels in the maxillary sinuses bilaterally suggesting early acute sinusitis. - repeat ct sinus 04/30/2014 > ok     Acute sinusitis, unspecified 08/14/2010   Qualifier: Diagnosis of  By: Clent Ridges NP, [name]     Anemia    Asthma    PFTs 06/15/05 FEV1 85% predicted ratio 68% and truncation of resp loop in a sawtooth pattern. HFA 25% 08-01-2009 >50%  Nov 01, 2009>50% 01-31-2010   Asthma, chronic 11/24/2007   Followed in Pulmonary clinic/ [facility]/ [name]  - PFTs 06/15/2005 FEV1 85% predicted  ratio 68% and truncation of respiratory loop in a sawtooth pattern  - HFA 25% August 01, 2009 > 50% November 01, 2009 > 50% January 31, 2010 therefore changed to neb bud/brovana - HFA 75% p extensive coaching 08/07/2013  > insurance issues so try symbicort 160 2 bid instead of neb - 11/16/2015   p extensive   Bladder mass    BRONCHITIS, ACUTE 01/22/2011   Qualifier: Diagnosis of  By: Clent Ridges NP, [name]     CAD (coronary artery disease), native coronary artery 02/06/2019   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   Cataract    Chronic cough    Chronic rhinitis 05/02/2009   Followed in Pulmonary clinic/ [facility]/ [name]    - 03/24/12 CT Sinus > Negative paranasal sinuses - Sinus CT 08/07/2013 >>Short air-fluid levels in the maxillary sinuses bilaterally suggesting early acute sinusitis - Repeat augmentin x 21 days 03/27/2014 then sinus ct if not better  - flonase/afrin regimen 02/11/15 >>>      Coronary heart disease    COUGH, CHRONIC 11/24/2007   Followed in Pulmonary clinic/ [facility]/ [name]      DDD (degenerative disc disease), lumbar    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   Followed in Pulmonary clinic/ [facility]/ [name]  -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     Excessive ear wax, bilateral 09/15/2018   GASTROESOPHAGEAL REFLUX DISEASE 08/27/2008   Followed as Primary Care Patient/ GI/ Dr  Danise Edge     Glaucoma    Hyperlipidemia LDL goal <70 02/06/2019   Hypertension    Obesity    Osteoarthritis    Paralyzed hemidiaphragm    left, 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   Retina disorder 2014   Severe obesity (BMI >= 40) (HCC) 11/24/2007       - Target wt < 191 to get under BMI 30    Ulcerative colitis    ULCERATIVE COLITIS 11/24/2007   Followed as Primary Care Patient/ GI/ Dr  Danise Edge     URI 04/09/2008   Qualifier: Diagnosis of  By: Clent Ridges NP, [name]     Past Surgical History:  Procedure Laterality Date   CARDIAC CATHETERIZATION Left     COLONOSCOPY WITH PROPOFOL N/A 11/29/2015   Procedure: COLONOSCOPY WITH PROPOFOL;  Surgeon: Charolett Bumpers, MD;  Location: [facility];  Service: Endoscopy;  Laterality: N/A;   CORONARY ARTERY BYPASS GRAFT     x6   Social History:  reports that he has never smoked. He has never used smokeless tobacco. He reports that he does not drink alcohol and does not use drugs.  Allergies  Allergen Reactions   Piroxicam Itching and Rash    Family History  Problem Relation Age of Onset   Heart disease Father    Hypertension Mother    Renal Disease Mother    Pulmonary Hypertension Mother    Other Sister        UNKNOWN HISTORY    Prior to Admission medications   Medication Sig Start Date End Date Taking? Authorizing Provider  acetaminophen (TYLENOL) 325 MG tablet Take 2 tablets (650 mg total) by mouth every 4 (four) hours as needed for mild pain (or Fever >/= 101). 06/10/23  Yes Lewie Chamber, MD  apixaban (ELIQUIS) 5 MG TABS tablet Take 1 tablet (5 mg total) by mouth 2 (two) times daily. 12/07/22  Yes Wendall Stade, MD  atorvastatin (LIPITOR) 40 MG tablet TAKE 1 TABLET BY MOUTH EVERY DAY 03/04/23  Yes Wendall Stade, MD  budesonide-formoterol (SYMBICORT) 160-4.5 MCG/ACT inhaler TAKE 2 PUFFS BY MOUTH TWICE A DAY Patient taking differently: Inhale 2 puffs into the lungs 2 (two) times daily. 09/18/21  Yes Nyoka Cowden, MD  carboxymethylcellulose (REFRESH PLUS) 0.5 % SOLN Use as needed for dry eye   Yes [provider]  cycloSPORINE (RESTASIS) 0.05 % ophthalmic emulsion Place 1 drop into both eyes 2 (two) times daily.   Yes [provider]  diltiazem (CARDIZEM CD) 240 MG 24 hr capsule TAKE 1 CAPSULE BY MOUTH EVERY DAY Patient taking differently: Take 240 mg by mouth daily. 05/14/23  Yes Wendall Stade, MD  escitalopram (LEXAPRO) 20 MG tablet Take 20 mg by mouth daily.   Yes [provider]  Eyelid Cleansers (OCUSOFT EYELID CLEANSING) PADS Apply topically as needed.  Use daily   Yes [provider]  ezetimibe (ZETIA) 10 MG tablet TAKE 1 TABLET BY MOUTH EVERY DAY Patient taking differently: Take 10 mg by mouth daily. 03/04/23  Yes Wendall Stade, MD  latanoprost (XALATAN) 0.005 % ophthalmic solution Place 1 drop into both eyes at bedtime.   Yes [provider]  losartan (COZAAR) 25 MG tablet Take 25 mg by mouth daily.   Yes [provider]  metoprolol tartrate (LOPRESSOR) 50 MG tablet TAKE 1 TABLET BY MOUTH TWICE A DAY 01/09/23  Yes Wendall Stade, MD  montelukast (SINGULAIR) 10 MG tablet TAKE ONE TABLET BY MOUTH AT BEDTIME EVERY NIGHT Patient taking differently: Take 10 mg by mouth at bedtime. 02/18/23  Yes Nyoka Cowden, MD  Multiple Vitamins-Minerals (CENTRUM SILVER PO) Take 1 tablet by mouth daily.   Yes [provider]  sulfaSALAzine (AZULFIDINE) 500 MG tablet Take 1,000 mg by mouth 4 (four) times daily.   Yes [provider]  Cholecalciferol (VITAMIN D) 50 MCG (2000 UT) tablet Take 2,000 Units by mouth daily.     [provider]  Ferrous Sulfate (SLOW FE) 142 (45 Fe) MG TBCR 1 tablet Orally Once a day    [provider]  nitroGLYCERIN (NITROSTAT) 0.4 MG SL tablet Place 1 tablet (0.4 mg total) under the tongue every 5 (five) minutes as needed for chest pain. 01/12/21   Wendall Stade, MD  VENTOLIN HFA 108 (90 Base) MCG/ACT inhaler INHALE 2 PUFFS INTO THE LUNGS EVERY 4 HRS AS NEEDED FOR WHEEZING OR SHORTNESS OF BREATH Patient taking differently: Inhale 2 puffs into the lungs every 4 (four) hours as needed for wheezing or shortness of breath. 03/19/19   Parrett, Virgel Bouquet, NP   The patient was seen shortly after coming to the PACU from the OR. Physical Exam: Vitals:   06/14/23 1022 06/28/23 0551 06/28/23 0631  BP:  (!) 142/67   Pulse:  81   Resp:  17   Temp:  97.9 F (36.6 C)   TempSrc:  Oral   SpO2:  94%   Weight: 90.7 kg  90.7 kg  Height: 5\' 3"  (1.6 m)  5\' 3"  (1.6 m)   Physical  Exam Vitals reviewed.  Constitutional:      General: He is awake. He is not in acute distress.    Appearance: He is ill-appearing.  HENT:     Head: Normocephalic.     Nose: No rhinorrhea.     Mouth/Throat:     Mouth: Mucous membranes are moist.  Eyes:     General: No scleral icterus.    Pupils: Pupils are equal, round, and reactive to light.  Cardiovascular:     Rate and Rhythm: Tachycardia present. Rhythm irregular.  Pulmonary:     Effort: Pulmonary effort is normal.     Breath sounds: Normal breath sounds.  Abdominal:     General: Abdomen is protuberant. Bowel sounds are normal. There is no distension.     Palpations: Abdomen is soft.     Tenderness: There is abdominal tenderness in the suprapubic area. There is no right CVA tenderness, left CVA tenderness, guarding or rebound.  Musculoskeletal:     Cervical back: Neck supple.     Right lower leg: No edema.     Left lower leg: No edema.     Comments: Severe generalized weakness more pronounced on the left.  Skin:    General: Skin is warm and dry.  Neurological:     General: No focal deficit present.     Mental Status: He is alert and oriented to person, place, and time.  Psychiatric:        Mood and Affect: Mood normal.        Behavior: Behavior normal. Behavior is cooperative.    Data Reviewed:   Results are pending, will review when available.   Family Communication:  Primary team communication:  Thank you very much for involving [place] in the care of your patient.  Author: Bobette Mo, MD 06/28/2023 9:51 AM  For on call review [email or web address].   This document was prepared using Dragon voice recognition software and may contain some unintended transcription errors.

## 2023-06-28 NOTE — H&P (Signed)
06/28/23: [name] presents today for a debulking TURBT for his extensive urothelial neoplasm.  He has continued to have intermittent voiding issues.   Cytology in June showed atypia.   [name] is a 77yo male who was admitted today with dizziness, recurrent diarrhea and intermittently bloody stools.  He has been having some hematuria recently and has had progressive LUTS with frequency, urgency and some difficulty voiding.  He had a CT abdomen/pelvis that showed multiple bladder lesions consistent urothelial carcinoma with the largest close to 5cm in diameter.  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  There is an 8mm non-obstructing stone in the renal pelvis.   His UA has >50 WBC, >50 RBC's and budding yeast but only a few bacteria.   Apart from the nephrectomy he has no other GU history.  He has been on Eliquis for afib but that has been stopped.    His comorbidities include a medical history significant for paroxysmal atrial fibrillation, history of acute sinusitis, asthma, history of acute bronchitis, CAD, CABG, cataracts, chronic cough, chronic rhinitis, GERD, glaucoma, hyperlipidemia, hypertension, class III obesity, osteoarthritis, left side paralyzed hemidiaphragm and a retinal disorder,  ROS:   Review of Systems  Constitutional:  Positive for chills, fever and malaise/fatigue.  Respiratory:  Positive for stridor.   Gastrointestinal:        Loss of appetite.   Genitourinary:  Positive for frequency, hematuria and urgency. Negative for flank pain.  Musculoskeletal:  Positive for joint pain (both knees.).  Neurological:  Positive for focal weakness.  All other systems reviewed and are negative.     Allergies      Allergies  Allergen Reactions   Piroxicam Itching and Rash            Past Medical History:  Diagnosis Date   A-fib (HCC)     Acute sinusitis 03/24/2012    Followed in Pulmonary clinic/ [facility]/ [name] - CT sinus  08/07/2013  Short air-fluid levels in the maxillary sinuses bilaterally suggesting early acute sinusitis. - repeat ct sinus 04/30/2014 > ok     Acute sinusitis, unspecified 08/14/2010    Qualifier: Diagnosis of  By: Clent Ridges NP, [name]     Asthma      PFTs 06/15/05 FEV1 85% predicted ratio 68% and truncation of resp loop in a sawtooth pattern. HFA 25% 08-01-2009 >50% Nov 01, 2009>50% 01-31-2010   Asthma, chronic 11/24/2007    Followed in Pulmonary clinic/ [facility]/ [name]  - PFTs 06/15/2005 FEV1 85% predicted ratio 68% and truncation of respiratory loop in a sawtooth pattern  - HFA 25% August 01, 2009 > 50% November 01, 2009 > 50% January 31, 2010 therefore changed to neb bud/brovana - HFA 75% p extensive coaching 08/07/2013  > insurance issues so try symbicort 160 2 bid instead of neb - 11/16/2015   p extensive   BRONCHITIS, ACUTE 01/22/2011    Qualifier: Diagnosis of  By: Clent Ridges NP, [name]     CAD (coronary artery disease), native coronary artery 02/06/2019    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   Cataract     Chronic cough     Chronic rhinitis 05/02/2009    Followed in Pulmonary clinic/ [facility]/ [name]    - 03/24/12 CT Sinus > Negative paranasal sinuses - Sinus CT 08/07/2013 >>Short air-fluid levels in the maxillary sinuses bilaterally suggesting early acute sinusitis - Repeat augmentin x 21 days 03/27/2014 then sinus ct  if not better  - flonase/afrin regimen 02/11/15 >>>      Coronary heart disease     COUGH, CHRONIC 11/24/2007    Followed in Pulmonary clinic/ [facility]/ [name]      DDD (degenerative disc disease), lumbar     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    Followed in Pulmonary clinic/ [facility]/ [name]  -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     Excessive ear wax, bilateral 09/15/2018   GASTROESOPHAGEAL REFLUX DISEASE 08/27/2008    Followed as Primary Care Patient/ GI/ Dr  Danise Edge     Glaucoma     Hyperlipidemia LDL goal <70 02/06/2019    Hypertension     Obesity     Osteoarthritis     Paralyzed hemidiaphragm      left, 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   Retina disorder 2014   Severe obesity (BMI >= 40) (HCC) 11/24/2007        - Target wt < 191 to get under BMI 30    Ulcerative colitis     ULCERATIVE COLITIS 11/24/2007    Followed as Primary Care Patient/ GI/ Dr  Danise Edge     URI 04/09/2008    Qualifier: Diagnosis of  By: Clent Ridges NP, [name]                 Past Surgical History:  Procedure Laterality Date   CARDIAC CATHETERIZATION Left     COLONOSCOPY WITH PROPOFOL N/A 11/29/2015    Procedure: COLONOSCOPY WITH PROPOFOL;  Surgeon: Charolett Bumpers, MD;  Location: [facility];  Service: Endoscopy;  Laterality: N/A;   CORONARY ARTERY BYPASS GRAFT        x6          Social History         Socioeconomic History   Marital status: Single      Spouse name: Not on file   Number of children: 0   Years of education: Not on file   Highest education level: Not on file  Occupational History   Occupation: disabled, prev worked in Designer, fashion/clothing  Tobacco Use   Smoking status: Never   Smokeless tobacco: Never  Substance and Sexual Activity   Alcohol use: No   Drug use: No   Sexual activity: Not on file  Other Topics Concern   Not on file  Social History Narrative    ** Merged History Encounter **         Social Determinants of Health        Financial Resource Strain: Not on file  Food Insecurity: No Food Insecurity (06/03/2023)    Hunger Vital Sign     Worried About Running Out of Food in the Last Year: Never true     Ran Out of Food in the Last Year: Never true  Transportation Needs: No Transportation Needs (06/03/2023)    PRAPARE - Therapist, art (Medical): No     Lack of Transportation (Non-Medical): No  Physical Activity: Not on file  Stress: Not on file  Social Connections: Not on file  Intimate Partner Violence: Not At Risk (06/03/2023)    Humiliation, Afraid, Rape, and Kick  questionnaire     Fear of Current or Ex-Partner: No     Emotionally Abused: No     Physically Abused: No     Sexually Abused: No           Family History  Problem Relation Age of Onset   Heart disease Father  Hypertension Mother     Renal Disease Mother     Pulmonary Hypertension Mother     Other Sister          UNKNOWN HISTORY          Anti-infectives: Anti-infectives (From admission, onward)        Start     Dose/Rate Route Frequency Ordered Stop    06/04/23 1000   cefTRIAXone (ROCEPHIN) 1 g in sodium chloride 0.9 % 100 mL IVPB        1 g 200 mL/hr over 30 Minutes Intravenous Every 24 hours 06/03/23 1830      06/03/23 1115   cefTRIAXone (ROCEPHIN) 1 g in sodium chloride 0.9 % 100 mL IVPB        1 g 200 mL/hr over 30 Minutes Intravenous  Once 06/03/23 1102 06/03/23 1434                      Current Facility-Administered Medications  Medication Dose Route Frequency Provider Last Rate Last Admin   acetaminophen (TYLENOL) tablet 650 mg  650 mg Oral Q6H PRN Bobette Mo, MD        Or   acetaminophen (TYLENOL) suppository 650 mg  650 mg Rectal Q6H PRN Bobette Mo, MD       albuterol (PROVENTIL) (2.5 MG/3ML) 0.083% nebulizer solution 2.5 mg  2.5 mg Nebulization Q4H PRN Lynden Ang, RPH       atorvastatin (LIPITOR) tablet 40 mg  40 mg Oral Daily Bobette Mo, MD       [START ON 06/04/2023] cefTRIAXone (ROCEPHIN) 1 g in sodium chloride 0.9 % 100 mL IVPB  1 g Intravenous Q24H Bobette Mo, MD       cycloSPORINE (RESTASIS) 0.05 % ophthalmic emulsion 1 drop  1 drop Both Eyes BID Bobette Mo, MD       diltiazem (CARDIZEM CD) 24 hr capsule 240 mg  240 mg Oral Daily Bobette Mo, MD       escitalopram Judye Bos) tablet 20 mg  20 mg Oral Daily Bobette Mo, MD       ezetimibe (ZETIA) tablet 10 mg  10 mg Oral Daily Bobette Mo, MD       latanoprost (XALATAN) 0.005 % ophthalmic solution 1 drop  1 drop Both Eyes QHS Bobette Mo, MD       losartan (COZAAR) tablet 25 mg  25 mg Oral Daily Bobette Mo, MD       metoprolol tartrate (LOPRESSOR) tablet 50 mg  50 mg Oral BID Bobette Mo, MD       mometasone-formoterol [facility]) 200-5 MCG/ACT inhaler 2 puff  2 puff Inhalation BID Bobette Mo, MD   2 puff at 06/03/23 2033   nitroGLYCERIN (NITROSTAT) SL tablet 0.4 mg  0.4 mg Sublingual Q5 min PRN Bobette Mo, MD       ondansetron [facility]) tablet 4 mg  4 mg Oral Q6H PRN Bobette Mo, MD        Or   ondansetron [facility]) injection 4 mg  4 mg Intravenous Q6H PRN Bobette Mo, MD       pantoprazole (PROTONIX) EC tablet 40 mg  40 mg Oral Daily Bobette Mo, MD       polyvinyl alcohol (LIQUIFILM TEARS) 1.4 % ophthalmic solution 1 drop  1 drop Both Eyes TID PRN Lynden Ang, RPH       sulfaSALAzine (AZULFIDINE) tablet 1,000 mg  1,000 mg Oral QID Bobette Mo, MD                Objective: Vital signs in last 24 hours: BP (!) 142/67   Pulse 81   Temp 97.9 F (36.6 C) (Oral)   Resp 17   Ht 5\' 3"  (1.6 m)   Wt 90.7 kg   SpO2 94%   BMI 35.43 kg/m     Intake/Output from previous day: No intake/output data recorded. Intake/Output this shift: No intake/output data recorded.     Physical Exam Vitals reviewed.  Constitutional:      Appearance: Normal appearance.  Cardiovascular:     Rate and Rhythm: Regular rhythm..     Heart sounds: Normal heart sounds.  Pulmonary:     Effort: Pulmonary effort is normal. No respiratory distress.  Abdominal:     General: Abdomen is flat.     Palpations: Abdomen is soft.     Tenderness: There is abdominal tenderness (suprapubic).     Hernia: No hernia is present.  Genitourinary:    Comments: Purewick apparatus in place.  Urine is amber.  Musculoskeletal:        General: No swelling. Normal range of motion.  Skin:    General: Skin is warm and dry.  Neurological:     Mental Status: He is alert and oriented to  person, place, and time.     Motor: Weakness (left side) present.  Psychiatric:        Mood and Affect: Mood normal.        Behavior: Behavior normal.        Lab Results:  Lab Results Last 24 Hours        Results for orders placed or performed during the hospital encounter of 06/03/23 (from the past 24 hour(s))  Urinalysis, Routine w reflex microscopic -Urine, Clean Catch     Status: Abnormal    Collection Time: 06/03/23  9:12 AM  Result Value Ref Range    Color, Urine AMBER (A) YELLOW    APPearance CLOUDY (A) CLEAR    Specific Gravity, Urine 1.015 1.005 - 1.030    pH 5.0 5.0 - 8.0    Glucose, UA NEGATIVE NEGATIVE mg/dL    Hgb urine dipstick LARGE (A) NEGATIVE    Bilirubin Urine NEGATIVE NEGATIVE    Ketones, ur NEGATIVE NEGATIVE mg/dL    Protein, ur 409 (A) NEGATIVE mg/dL    Nitrite NEGATIVE NEGATIVE    Leukocytes,Ua NEGATIVE NEGATIVE    RBC / HPF >50 0 - 5 RBC/hpf    WBC, UA >50 0 - 5 WBC/hpf    Bacteria, UA RARE (A) NONE SEEN    Squamous Epithelial / HPF 0-5 0 - 5 /HPF    WBC Clumps PRESENT      Mucus PRESENT      Budding Yeast PRESENT    POC CBG, ED     Status: None    Collection Time: 06/03/23  9:18 AM  Result Value Ref Range    Glucose-Capillary 94 70 - 99 mg/dL  CBC with Differential     Status: Abnormal    Collection Time: 06/03/23  9:20 AM  Result Value Ref Range    WBC 5.0 4.0 - 10.5 K/uL    RBC 3.37 (L) 4.22 - 5.81 MIL/uL    Hemoglobin 9.5 (L) 13.0 - 17.0 g/dL    HCT 81.1 (L) 91.4 - 52.0 %    MCV 96.4 80.0 - 100.0 fL    MCH 28.2  26.0 - 34.0 pg    MCHC 29.2 (L) 30.0 - 36.0 g/dL    RDW 40.9 (H) 81.1 - 15.5 %    Platelets 239 150 - 400 K/uL    nRBC 0.0 0.0 - 0.2 %    Neutrophils Relative % 68 %    Neutro Abs 3.4 1.7 - 7.7 K/uL    Lymphocytes Relative 18 %    Lymphs Abs 0.9 0.7 - 4.0 K/uL    Monocytes Relative 11 %    Monocytes Absolute 0.5 0.1 - 1.0 K/uL    Eosinophils Relative 2 %    Eosinophils Absolute 0.1 0.0 - 0.5 K/uL    Basophils Relative 1 %     Basophils Absolute 0.0 0.0 - 0.1 K/uL    Immature Granulocytes 0 %    Abs Immature Granulocytes 0.02 0.00 - 0.07 K/uL  Comprehensive metabolic panel     Status: Abnormal    Collection Time: 06/03/23  9:20 AM  Result Value Ref Range    Sodium 141 135 - 145 mmol/L    Potassium 3.8 3.5 - 5.1 mmol/L    Chloride 109 98 - 111 mmol/L    CO2 22 22 - 32 mmol/L    Glucose, Bld 98 70 - 99 mg/dL    BUN 30 (H) 8 - 23 mg/dL    Creatinine, Ser 9.14 0.61 - 1.24 mg/dL    Calcium 8.8 (L) 8.9 - 10.3 mg/dL    Total Protein 6.4 (L) 6.5 - 8.1 g/dL    Albumin 3.3 (L) 3.5 - 5.0 g/dL    AST 18 15 - 41 U/L    ALT 16 0 - 44 U/L    Alkaline Phosphatase 59 38 - 126 U/L    Total Bilirubin 0.3 0.3 - 1.2 mg/dL    GFR, Estimated >78 >29 mL/min    Anion gap 10 5 - 15  Magnesium     Status: None    Collection Time: 06/03/23  9:20 AM  Result Value Ref Range    Magnesium 2.0 1.7 - 2.4 mg/dL  Type and screen [facility]     Status: None    Collection Time: 06/03/23  9:35 AM  Result Value Ref Range    ABO/RH(D) A POS      Antibody Screen NEG      Sample Expiration          06/06/2023,2359 Performed at [facility], [address] [place], [place] [address]    Protime-INR     Status: None    Collection Time: 06/03/23  9:35 AM  Result Value Ref Range    Prothrombin Time 13.7 11.4 - 15.2 seconds    INR 1.0 0.8 - 1.2  APTT     Status: None    Collection Time: 06/03/23  9:35 AM  Result Value Ref Range    aPTT 30 24 - 36 seconds  ABO/Rh     Status: None    Collection Time: 06/03/23  5:45 PM  Result Value Ref Range    ABO/RH(D)          A POS Performed at [facility], [address] [place], [place] [address]    Hemoglobin and hematocrit, blood     Status: Abnormal    Collection Time: 06/03/23  5:45 PM  Result Value Ref Range    Hemoglobin 9.9 (L) 13.0 - 17.0 g/dL    HCT 84.6 (L) 96.2 - 52.0 %  BMET Recent Labs (last 2 labs)     Recent  Labs    06/03/23 0920  NA 141  K 3.8  CL 109  CO2 22  GLUCOSE 98  BUN 30*  CREATININE 0.81  CALCIUM 8.8*      PT/INR Recent Labs (last 2 labs)     Recent Labs    06/03/23 0935  LABPROT 13.7  INR 1.0      ABG  Recent Labs (last 2 labs)  No results for input(s): "PHART", "HCO3" in the last 72 hours.   Invalid input(s): "PCO2", "PO2"     Studies/Results:  Imaging Results (Last 48 hours)  CT ABDOMEN PELVIS W CONTRAST   Result Date: 06/03/2023 CLINICAL DATA:  Pain history of ulcerative colitis. EXAM: CT ABDOMEN AND PELVIS WITH CONTRAST TECHNIQUE: Multidetector CT imaging of the abdomen and pelvis was performed using the standard protocol following bolus administration of intravenous contrast. RADIATION DOSE REDUCTION: This exam was performed according to the departmental dose-optimization program which includes automated exposure control, adjustment of the mA and/or kV according to patient size and/or use of iterative reconstruction technique. CONTRAST:  OMNIPAQUE IOHEXOL 300 MG/ML  SOLN COMPARISON:  None Available. FINDINGS: Lower chest: Breathing motion at the lung bases. Patient is status post median sternotomy. Coronary artery calcifications are seen. Elevated left hemidiaphragm. Calcified nodule left lower lobe series 5, image 28 consistent with old granulomatous disease. Hepatobiliary: No space-occupying liver lesion. Patent portal vein. Gallbladder is nondilated. Pancreas: Unremarkable. No pancreatic ductal dilatation or surrounding inflammatory changes. Spleen: Normal in size without focal abnormality. Adrenals/Urinary Tract: Slight nonspecific nodularity of the adrenal glands. The left kidney is absent. The remnant distal left ureter is mildly dilated. Nonobstructing right-sided renal stone along the renal pelvis measuring 8 mm on series 3, image 40. No separate enhancing right-sided renal mass or right-sided renal collecting system dilatation. The bladder however has  multiple mass lesions along the lumen diffusely distributed. Please correlate for any known history including for potential bladder cancer. Area of dystrophic calcification as well along the posterior aspect of the the bladder on the right side near the margin of the UVJ. Stomach/Bowel: No oral contrast. Stomach is nondilated. Small bowel is nondilated. There is a large left inguinal hernia involving a significant portion of the sigmoid colon. This a wide mouth hernia. No signs of obstruction. Few areas of colonic diverticula. No specific colonic wall thickening, inflammatory stranding. The appendix is poorly seen in the right lower quadrant but no pericecal stranding or fluid. Vascular/Lymphatic: Aortic atherosclerosis. No enlarged abdominal or pelvic lymph nodes. Reproductive: Prostate is unremarkable. Other: Small fat containing umbilical hernia Musculoskeletal: Curvature of the spine. Scattered degenerative changes of the spine and pelvis. IMPRESSION: Multiple mass lesions along the bladder lumen with some areas of dystrophic calcification near the margin of the right UVJ. Please correlate for any known history. Neoplasm is possible recommend further workup when appropriate otherwise. Absence of the left kidney. Nonobstructing right-sided renal stone. No bowel obstruction, free air or free fluid. Large left inguinal hernia involving sigmoid colon. Electronically Signed   By: Karen Kays M.D.   On: 06/03/2023 11:13         Assessment/Plan: Multifocal bladder lesions consistent with urothelial carcinoma. He is for a TURBT today.  Risks reviewed.    Solitary right kidney with 8mm renal pelvic stone.  This will need treatment at some point as well.   With the bladder lesions, he might need a percutaneous approach if the ureteral orifice  is involved with tumor.

## 2023-06-28 NOTE — Progress Notes (Signed)
Call friend - Consuella Lose @ [phone] (pt friend) to provide updates post surgery. Call [name] w/ [facility] to arrange discharge transportation @ [phone]

## 2023-06-28 NOTE — Plan of Care (Signed)
  Problem: Clinical Measurements: Goal: Respiratory complications will improve Outcome: Progressing   Problem: Nutrition: Goal: Adequate nutrition will be maintained Outcome: Progressing   Problem: Clinical Measurements: Goal: Ability to maintain clinical measurements within normal limits will improve Outcome: Not Progressing Goal: Cardiovascular complication will be avoided Outcome: Not Progressing   Problem: Activity: Goal: Risk for activity intolerance will decrease Outcome: Not Progressing

## 2023-06-28 NOTE — Progress Notes (Signed)
[facility] consulting physician addendum:  The patient has been having A-fib with RVR with low to soft normal BP measurements.  He partially responded to a bolus of amiodarone 150 mg IVP x 1 and NS 500 mL bolus, but given his hypotension his oral extended release diltiazem and metoprolol have been held.  He is currently in the 120s.  We will start low-dose diltiazem infusion,, NS 250 mL bolus, albumin 50 g and magnesium sulfate 2 g IVPB ordered.  His H&H continues to decrease and we will transfuse if hemoglobin goes below 8.0 g/dL, but would consider transfusing with a higher level if symptoms do not improve and hemoglobin continues to trend down.  Sanda Klein, MD.

## 2023-06-28 NOTE — Transfer of Care (Signed)
Immediate Anesthesia Transfer of Care Note  Patient: Barry [name]  Procedure(s) Performed: CYSTOSCOPY TRANSURETHRAL RESECTION OF BLADDER TUMOR (TURBT)  Patient Location: PACU  Anesthesia Type:General  Level of Consciousness: awake and patient cooperative  Airway & Oxygen Therapy: Patient Spontanous Breathing and Patient connected to face mask oxygen  Post-op Assessment: Report given to RN and Post -op Vital signs reviewed and stable  Post vital signs: Reviewed and stable  Last Vitals:  Vitals Value Taken Time  BP 152/79 06/28/23 0949  Temp    Pulse 113 06/28/23 0954  Resp 18 06/28/23 0954  SpO2 97 % 06/28/23 0954  Vitals shown include unfiled device data.  Last Pain:  Vitals:   06/28/23 0619  TempSrc:   PainSc: 0-No pain         Complications: No notable events documented.

## 2023-06-28 NOTE — Anesthesia Postprocedure Evaluation (Signed)
Anesthesia Post Note  Patient: Barry [name]  Procedure(s) Performed: CYSTOSCOPY TRANSURETHRAL RESECTION OF BLADDER TUMOR (TURBT)     Patient location during evaluation: PACU Anesthesia Type: General Level of consciousness: awake and alert Pain management: pain level controlled Vital Signs Assessment: post-procedure vital signs reviewed and stable Respiratory status: spontaneous breathing, nonlabored ventilation, respiratory function stable and patient connected to nasal cannula oxygen Cardiovascular status: blood pressure returned to baseline and stable Postop Assessment: no apparent nausea or vomiting Anesthetic complications: no   No notable events documented.  Last Vitals:  Vitals:   06/28/23 1045 06/28/23 1100  BP: 110/74 (!) 119/92  Pulse: (!) 104 (!) 113  Resp: 12 15  Temp:    SpO2: 93% 93%    Last Pain:  Vitals:   06/28/23 1100  TempSrc:   PainSc: 5                  Facundo Allemand S

## 2023-06-29 ENCOUNTER — Encounter (HOSPITAL_COMMUNITY): Payer: Self-pay | Admitting: Urology

## 2023-06-29 DIAGNOSIS — I2510 Atherosclerotic heart disease of native coronary artery without angina pectoris: Secondary | ICD-10-CM | POA: Diagnosis present

## 2023-06-29 DIAGNOSIS — I4891 Unspecified atrial fibrillation: Secondary | ICD-10-CM | POA: Diagnosis not present

## 2023-06-29 DIAGNOSIS — B9689 Other specified bacterial agents as the cause of diseases classified elsewhere: Secondary | ICD-10-CM | POA: Diagnosis not present

## 2023-06-29 DIAGNOSIS — Z8249 Family history of ischemic heart disease and other diseases of the circulatory system: Secondary | ICD-10-CM | POA: Diagnosis not present

## 2023-06-29 DIAGNOSIS — D494 Neoplasm of unspecified behavior of bladder: Secondary | ICD-10-CM | POA: Diagnosis not present

## 2023-06-29 DIAGNOSIS — M1990 Unspecified osteoarthritis, unspecified site: Secondary | ICD-10-CM | POA: Diagnosis not present

## 2023-06-29 DIAGNOSIS — R339 Retention of urine, unspecified: Secondary | ICD-10-CM | POA: Diagnosis not present

## 2023-06-29 DIAGNOSIS — N3289 Other specified disorders of bladder: Secondary | ICD-10-CM | POA: Diagnosis not present

## 2023-06-29 DIAGNOSIS — Z79899 Other long term (current) drug therapy: Secondary | ICD-10-CM | POA: Diagnosis not present

## 2023-06-29 DIAGNOSIS — Z66 Do not resuscitate: Secondary | ICD-10-CM | POA: Diagnosis present

## 2023-06-29 DIAGNOSIS — Z841 Family history of disorders of kidney and ureter: Secondary | ICD-10-CM | POA: Diagnosis not present

## 2023-06-29 DIAGNOSIS — I4821 Permanent atrial fibrillation: Secondary | ICD-10-CM | POA: Diagnosis present

## 2023-06-29 DIAGNOSIS — E785 Hyperlipidemia, unspecified: Secondary | ICD-10-CM | POA: Diagnosis present

## 2023-06-29 DIAGNOSIS — K5190 Ulcerative colitis, unspecified, without complications: Secondary | ICD-10-CM | POA: Diagnosis present

## 2023-06-29 DIAGNOSIS — Z888 Allergy status to other drugs, medicaments and biological substances status: Secondary | ICD-10-CM | POA: Diagnosis not present

## 2023-06-29 DIAGNOSIS — D649 Anemia, unspecified: Secondary | ICD-10-CM | POA: Diagnosis not present

## 2023-06-29 DIAGNOSIS — E669 Obesity, unspecified: Secondary | ICD-10-CM | POA: Diagnosis present

## 2023-06-29 DIAGNOSIS — J45909 Unspecified asthma, uncomplicated: Secondary | ICD-10-CM | POA: Diagnosis present

## 2023-06-29 DIAGNOSIS — H409 Unspecified glaucoma: Secondary | ICD-10-CM | POA: Diagnosis present

## 2023-06-29 DIAGNOSIS — R1312 Dysphagia, oropharyngeal phase: Secondary | ICD-10-CM | POA: Diagnosis not present

## 2023-06-29 DIAGNOSIS — Z951 Presence of aortocoronary bypass graft: Secondary | ICD-10-CM | POA: Diagnosis not present

## 2023-06-29 DIAGNOSIS — Z7901 Long term (current) use of anticoagulants: Secondary | ICD-10-CM | POA: Diagnosis not present

## 2023-06-29 DIAGNOSIS — R41841 Cognitive communication deficit: Secondary | ICD-10-CM | POA: Diagnosis not present

## 2023-06-29 DIAGNOSIS — Z8711 Personal history of peptic ulcer disease: Secondary | ICD-10-CM | POA: Diagnosis not present

## 2023-06-29 DIAGNOSIS — N200 Calculus of kidney: Secondary | ICD-10-CM | POA: Diagnosis present

## 2023-06-29 DIAGNOSIS — R531 Weakness: Secondary | ICD-10-CM | POA: Diagnosis not present

## 2023-06-29 DIAGNOSIS — N390 Urinary tract infection, site not specified: Secondary | ICD-10-CM | POA: Diagnosis not present

## 2023-06-29 DIAGNOSIS — K219 Gastro-esophageal reflux disease without esophagitis: Secondary | ICD-10-CM | POA: Diagnosis present

## 2023-06-29 DIAGNOSIS — D62 Acute posthemorrhagic anemia: Secondary | ICD-10-CM | POA: Diagnosis not present

## 2023-06-29 DIAGNOSIS — Z7401 Bed confinement status: Secondary | ICD-10-CM | POA: Diagnosis not present

## 2023-06-29 DIAGNOSIS — Z6835 Body mass index (BMI) 35.0-35.9, adult: Secondary | ICD-10-CM | POA: Diagnosis not present

## 2023-06-29 DIAGNOSIS — N138 Other obstructive and reflux uropathy: Secondary | ICD-10-CM | POA: Diagnosis not present

## 2023-06-29 DIAGNOSIS — R2681 Unsteadiness on feet: Secondary | ICD-10-CM | POA: Diagnosis not present

## 2023-06-29 DIAGNOSIS — R310 Gross hematuria: Secondary | ICD-10-CM

## 2023-06-29 DIAGNOSIS — Z483 Aftercare following surgery for neoplasm: Secondary | ICD-10-CM | POA: Diagnosis not present

## 2023-06-29 DIAGNOSIS — F05 Delirium due to known physiological condition: Secondary | ICD-10-CM | POA: Diagnosis not present

## 2023-06-29 DIAGNOSIS — I10 Essential (primary) hypertension: Secondary | ICD-10-CM | POA: Diagnosis present

## 2023-06-29 DIAGNOSIS — Z7951 Long term (current) use of inhaled steroids: Secondary | ICD-10-CM | POA: Diagnosis not present

## 2023-06-29 DIAGNOSIS — C678 Malignant neoplasm of overlapping sites of bladder: Secondary | ICD-10-CM | POA: Diagnosis present

## 2023-06-29 DIAGNOSIS — I959 Hypotension, unspecified: Secondary | ICD-10-CM | POA: Diagnosis not present

## 2023-06-29 DIAGNOSIS — M6281 Muscle weakness (generalized): Secondary | ICD-10-CM | POA: Diagnosis not present

## 2023-06-29 LAB — COMPREHENSIVE METABOLIC PANEL
ALT: 32 U/L (ref 0–44)
AST: 34 U/L (ref 15–41)
Albumin: 3.1 g/dL — ABNORMAL LOW (ref 3.5–5.0)
Alkaline Phosphatase: 56 U/L (ref 38–126)
Anion gap: 8 (ref 5–15)
BUN: 21 mg/dL (ref 8–23)
CO2: 22 mmol/L (ref 22–32)
Calcium: 8.3 mg/dL — ABNORMAL LOW (ref 8.9–10.3)
Chloride: 108 mmol/L (ref 98–111)
Creatinine, Ser: 0.7 mg/dL (ref 0.61–1.24)
GFR, Estimated: >60 mL/min (ref >60)
Glucose, Bld: 97 mg/dL (ref 70–99)
Potassium: 4.4 mmol/L (ref 3.5–5.1)
Sodium: 138 mmol/L (ref 135–145)
Total Bilirubin: 0.4 mg/dL (ref 0.3–1.2)
Total Protein: 5.3 g/dL — ABNORMAL LOW (ref 6.5–8.1)

## 2023-06-29 LAB — CBC
HCT: 26.8 % — ABNORMAL LOW (ref 39.0–52.0)
Hemoglobin: 8.2 g/dL — ABNORMAL LOW (ref 13.0–17.0)
MCH: 28.4 pg (ref 26.0–34.0)
MCHC: 30.6 g/dL (ref 30.0–36.0)
MCV: 92.7 fL (ref 80.0–100.0)
Platelets: 203 10*3/uL (ref 150–400)
RBC: 2.89 MIL/uL — ABNORMAL LOW (ref 4.22–5.81)
RDW: 17.1 % — ABNORMAL HIGH (ref 11.5–15.5)
WBC: 8.2 10*3/uL (ref 4.0–10.5)
nRBC: 0 % (ref 0.0–0.2)

## 2023-06-29 MED ORDER — DILTIAZEM HCL ER COATED BEADS 240 MG PO CP24
240.0000 mg | ORAL_CAPSULE | Freq: Every day | ORAL | Status: DC
  Administered 2023-06-29 – 2023-07-02 (×4): 240 mg via ORAL
  Filled 2023-06-29: qty 2
  Filled 2023-06-29 (×2): qty 1
  Filled 2023-06-29: qty 2

## 2023-06-29 MED ORDER — MOMETASONE FURO-FORMOTEROL FUM 200-5 MCG/ACT IN AERO
2.0000 | INHALATION_SPRAY | Freq: Two times a day (BID) | RESPIRATORY_TRACT | Status: DC
  Administered 2023-06-29 – 2023-07-02 (×7): 2 via RESPIRATORY_TRACT
  Filled 2023-06-29: qty 8.8

## 2023-06-29 MED ORDER — POLYETHYLENE GLYCOL 3350 17 G PO PACK
17.0000 g | PACK | Freq: Two times a day (BID) | ORAL | Status: AC
  Administered 2023-06-29 (×2): 17 g via ORAL
  Filled 2023-06-29 (×2): qty 1

## 2023-06-29 NOTE — Progress Notes (Signed)
[facility] Consult PROGRESS NOTE    Progress Note  Barry [name]  ZOX:[id number] DOB: [birth year] DOA: 06/28/2023 PCP: Georgann Housekeeper, MD     Brief Narrative:   Barry [name] is an 77 y.o. male past medical history paroxysmal atrial fibrillation on Eliquis History of sinusitis, CABG, hyperlipidemia hypertension morbid obesity left-sided paralysis diaphragm ulcerative colitis recently admitted about a month prior to admission for complicated UTI and a bladder mass along with a lower GI bleed in the setting of ulcerative colitis underwent TURBT with Dr. Annabell Howells earlier today and we are being consulted for medical management.  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  Show with persistent hematuria Recurrently holding apixaban. Monitor hemoglobin and transfuse if symptomatic or less than 8 due to history of coronary artery disease. He status post 1 unit of packed red blood cells. Consult physical therapy. Out of bed to chair. He is getting narcotics for analgesics start him on a bowel regimen.  Acute blood loss anemia: Has a history of CABG monitor hemoglobin closely transfuse if hemoglobin less than 8.  Paroxysmal atrial fibrillation With a chads Vascor of at least 5. Hypotensive currently in the 80s and 100 received esmolol and IV metoprolol by anesthesia. Also given 10 mg of IV Cardizem with partial response. He was bolused 500 cc of normal saline and a bolus of amiodarone 150 mg. The potassium greater than 4 magnesium greater than 2. Continue to hold  DOAC.  Coronary artery disease: Hold apixaban. Continue statins daily.  Essential hypertension: Hold oral antihypertensive medication blood pressure is actually quite good around 120/80.  Hyperlipidemia: Continue statins.  GERD: Continue PPI.  Ulcerative of colitis: Continue cefazolin 4 times a day.   DVT prophylaxis: None Family Communication:None Status is: Observation The patient remains OBS appropriate and will d/c before 2 midnights.    Code Status:     Code Status Orders  (From admission, onward)           Start     Ordered   06/28/23 0958  Do not attempt resuscitation (DNR)  Continuous       Question Answer Comment  If patient has no pulse and is not breathing Do Not Attempt Resuscitation   If patient has a pulse and/or is breathing: Medical Treatment Goals LIMITED ADDITIONAL INTERVENTIONS: Use medication/IV fluids and cardiac monitoring as indicated; Do not use intubation or mechanical ventilation (DNI), also provide comfort medications.  Transfer to Progressive/Stepdown as indicated, avoid Intensive Care.   Consent: Discussion documented in EHR or advanced directives reviewed      06/28/23 0957           Code Status History     Date Active Date Inactive Code Status Order ID Comments User Context   06/28/2023 0941 06/28/2023 0957 Full Code [id number]  Bjorn Pippin, MD Inpatient   06/03/2023 1621 06/10/2023 1937 Full Code [id number]  Bobette Mo, MD ED      Advance Directive Documentation    Flowsheet Row Most Recent Value  Type of Advance Directive Healthcare Power of Attorney  Pre-existing out of facility DNR order (yellow form or pink MOST form) --  "  MOST" Form in Place? --         IV Access:   Peripheral IV   Procedures and diagnostic studies:   No results found.   Medical Consultants:   None.   Subjective:    Barry [name] has no new complaints  Objective:    Vitals:   06/29/23 0342 06/29/23 0400 06/29/23 0500  06/29/23 0600  BP:  (!) 114/56 (!) 125/58 (!) 121/52  Pulse:  68 72 70  Resp:  18 19 (!) 22  Temp: 97.8 F (36.6 C)     TempSrc: Oral     SpO2:  96% 94% 96%  Weight:      Height:       SpO2: 96 % O2 Flow Rate (L/min): 2 L/min   Intake/Output Summary (Last 24 hours) at 06/29/2023 0715 Last data filed at 06/29/2023 0130 Gross per 24 hour  Intake 3340.67 ml  Output 1375 ml  Net 1965.67 ml   Filed Weights   06/14/23 1022 06/28/23 0631 06/28/23 1248  Weight: 90.7 kg 90.7 kg 91.2 kg    Exam: General exam: In no acute distress. Respiratory system: Good air movement and clear to auscultation. Cardiovascular system: S1 & S2 heard, RRR. No JVD. Gastrointestinal system: Abdomen is nondistended, soft and nontender.  Extremities: No pedal edema. Skin: No rashes, lesions or ulcers Psychiatry: Judgement and insight appear normal. Mood & affect appropriate.    Data Reviewed:    Labs: Basic Metabolic Panel: Recent Labs  Lab 06/28/23 1443 06/29/23 0309  NA 137 138  K 4.3 4.4  CL 108 108  CO2 20* 22  GLUCOSE 139* 97  BUN 25* 21  CREATININE 0.82 0.70  CALCIUM 8.3* 8.3*  MG 1.8  --   PHOS 3.9  --    GFR Estimated Creatinine Clearance: 74.8 mL/min (by C-G formula based on SCr of 0.7 mg/dL). Liver Function Tests: Recent Labs  Lab 06/28/23 1443 06/29/23 0309  AST 35 34  ALT 41 32  ALKPHOS 76 56  BILITOT 0.3 0.4  PROT 5.5* 5.3*  ALBUMIN 2.4* 3.1*   No results for input(s): "LIPASE", "AMYLASE" in the last 168 hours. No results for input(s): "AMMONIA" in the last 168 hours. Coagulation profile No results for input(s): "INR", "PROTIME" in the last 168 hours. COVID-19 Labs  No results for input(s): "DDIMER", "FERRITIN", "LDH", "CRP" in the last 72 hours.  No results found for: "SARSCOV2NAA"  CBC: Recent Labs  Lab 06/28/23 0600 06/28/23 0920 06/28/23 1017 06/28/23 1443 06/28/23 2105 06/29/23 0309  WBC 3.9* 4.1  --   --   --  8.2  HGB 10.8* 10.1* 9.9* 9.2*  7.0* 8.2*  HCT 36.3* 34.2* 34.7* 35.4* 24.0* 26.8*  MCV 93.1 93.2  --   --   --  92.7  PLT 295 328  --   --   --  203   Cardiac Enzymes: No results for input(s): "CKTOTAL", "CKMB", "CKMBINDEX", "TROPONINI" in the last 168 hours. BNP (last 3 results) No results for input(s): "PROBNP" in the last 8760 hours. CBG: Recent Labs  Lab 06/28/23 1245  GLUCAP 126*   D-Dimer: No results for input(s): "DDIMER" in the last 72 hours. Hgb A1c: No results for input(s): "HGBA1C" in the last 72 hours. Lipid Profile: No results for input(s): "CHOL", "HDL", "LDLCALC", "TRIG", "CHOLHDL", "LDLDIRECT" in the last 72 hours. Thyroid function studies: No results for input(s): "TSH", "T4TOTAL", "T3FREE", "THYROIDAB" in the last 72 hours.  Invalid input(s): "FREET3" Anemia work up: No  results for input(s): "VITAMINB12", "FOLATE", "FERRITIN", "TIBC", "IRON", "RETICCTPCT" in the last 72 hours. Sepsis Labs: Recent Labs  Lab 06/28/23 0600 06/28/23 0920 06/29/23 0309  WBC 3.9* 4.1 8.2   Microbiology Recent Results (from the past 240 hour(s))  MRSA Next Gen by PCR, Nasal     Status: None   Collection Time: 06/28/23 12:31 PM   Specimen: Nasal Mucosa; Nasal Swab  Result Value Ref Range Status   MRSA by PCR Next Gen NOT DETECTED NOT DETECTED Final    Comment: (NOTE) The GeneXpert MRSA Assay (FDA approved for NASAL specimens only), is one component of a comprehensive MRSA colonization surveillance program. It is not intended to diagnose MRSA infection nor to guide or monitor treatment for MRSA infections. Test performance is not FDA approved in patients less than 55 years old. Performed at [facility], [address] [place], [place] [address]      Medications:    sodium chloride   Intravenous Once   atorvastatin  40 mg Oral Daily   Chlorhexidine Gluconate Cloth  6 each Topical Daily   cycloSPORINE  1 drop Both Eyes BID   escitalopram  20 mg Oral Daily   ezetimibe  10 mg  Oral Daily   latanoprost  1 drop Both Eyes QHS   losartan  25 mg Oral Daily   metoprolol tartrate  50 mg Oral BID   montelukast  10 mg Oral QHS   pantoprazole  40 mg Oral Daily   sulfaSALAzine  1,000 mg Oral QID   Continuous Infusions:  0.45 % NaCl with KCl 20 mEq / L 100 mL/hr at 06/28/23 2327   sodium chloride      ceFAZolin (ANCEF) IV Stopped (06/29/23 6045)   diltiazem (CARDIZEM) infusion 10 mg/hr (06/29/23 0609)      LOS: 0 days   Marinda Elk  [facility]  06/29/2023, 7:15 AM

## 2023-06-29 NOTE — TOC Initial Note (Signed)
Transition of Care [facility]) - Initial/Assessment Note    Patient Details  Name: Barry [name] MRN: [id number] Date of Birth: [birth year]  [facility]) CM/SW Contact:    Carmina Miller, LCSWA Phone Number: 06/29/2023, 11:54 AM  Clinical Narrative:                  CSW spoke with Consuella Lose, friend of pt via phone. Consuella Lose states pt was sent to [facility] for [employer] after his last admission and had about one week left before he would be in copay status. Consuella Lose states she is concerned about pt losing his STR bed, she states pt will not be able to return home, states she was working with [employer] for [employer] (she completed the application and is waiting on a return call to submit it to the company AF contracts with). CSW reached out to AF, Angelique Blonder states pt will not loose his STR bed. Consuella Lose states she will reach back out to AF on Monday about pt's Medicaid app. TOC will continue to follow.         Patient Goals and CMS Choice            Expected Discharge Plan and Services                                              Prior Living Arrangements/Services                       Activities of Daily Living Home Assistive Devices/Equipment: Wheelchair, Eyeglasses ADL Screening (condition at time of admission) Patient's cognitive ability adequate to safely complete daily activities?: Yes Is the patient deaf or have difficulty hearing?: No Does the patient have difficulty seeing, even when wearing glasses/contacts?: No Does the patient have difficulty concentrating, remembering, or making decisions?: No Patient able to express need for assistance with ADLs?: Yes Does the patient have difficulty dressing or bathing?: Yes Independently performs ADLs?: No Communication: Independent Dressing (OT): Dependent Is this a change from baseline?: Pre-admission baseline Grooming: Dependent Is this a change from baseline?: Pre-admission baseline Feeding: Needs assistance Is  this a change from baseline?: Pre-admission baseline Bathing: Dependent Is this a change from baseline?: Pre-admission baseline Toileting: Dependent Is this a change from baseline?: Pre-admission baseline In/Out Bed: Dependent Is this a change from baseline?: Pre-admission baseline Walks in Home: Dependent Is this a change from baseline?: Pre-admission baseline Does the patient have difficulty walking or climbing stairs?: Yes Weakness of Legs: Both Weakness of Arms/Hands: None  Permission Sought/Granted                  Emotional Assessment              Admission diagnosis:  Malignant neoplasm of overlapping sites of bladder [facility]) [C67.8] Patient Active Problem List   Diagnosis Date Noted   Malignant neoplasm of overlapping sites of bladder (HCC) 06/28/2023   Gross hematuria 06/28/2023   Class 2 obesity 06/28/2023   ABLA (acute blood loss anemia) 06/28/2023   Paroxysmal atrial fibrillation with RVR (HCC) 06/28/2023   Physical deconditioning 06/05/2023   Effusion of left knee 06/05/2023   Preoperative cardiovascular examination 06/04/2023   Hx of CABG 06/04/2023   Bladder mass 06/03/2023   Pressure injury of skin 06/03/2023   Lower GI bleed 06/03/2023   PAF (paroxysmal atrial fibrillation) (  HCC) 01/11/2023   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 (HCC) 01/11/2023   Seborrheic dermatitis 09/02/2020   CAD (coronary artery disease), native coronary artery 02/06/2019   Benign essential HTN 02/06/2019   Hyperlipidemia LDL goal <70 02/06/2019   Excessive ear wax, bilateral 09/15/2018   Acute sinusitis 03/24/2012   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   Chronic rhinitis 05/02/2009   GASTROESOPHAGEAL REFLUX DISEASE 08/27/2008   Severe obesity (BMI >= 40) (HCC) 11/24/2007   Asthma, chronic 11/24/2007   ULCERATIVE COLITIS 11/24/2007   PCP:  Georgann Housekeeper, MD Pharmacy:   [facility] [phone] - [place], [place] - [address] [place] [address] Phone: [phone] Fax: [phone]     Social Determinants of Health (SDOH) Social History: SDOH Screenings   Food Insecurity: No Food Insecurity (06/28/2023)  Housing: Low Risk  (06/28/2023)  Transportation Needs: No Transportation Needs (06/28/2023)  Utilities: Not At Risk (06/28/2023)  Tobacco Use: Low Risk  (06/28/2023)   SDOH Interventions:     Readmission Risk Interventions     No data to display

## 2023-06-29 NOTE — Progress Notes (Signed)
    Patient Name: Barry [name]           DOB: [birth year]  MRN: [id number]      Admission Date: 06/28/2023  Attending Provider: Bjorn Pippin, MD  Primary Diagnosis: Malignant neoplasm of overlapping sites of bladder [facility])   Level of care: Stepdown    CROSS COVER NOTE   Date of Service   06/29/2023   Barry [name], 77 y.o. male, was admitted on 06/28/2023 for Malignant neoplasm of overlapping sites of bladder [facility]).    HPI/Events of Note   Acute Blood Loss Anemia  Gross hematuria in the setting of malignant neoplasm of overlapping sites of bladder.   Hemoglobin 9.2 -->  7.0.  Hold apixaban No acute changes reported.  Hemodynamically stable. No melena, hematochezia, or other bleeding reported tonight.   Interventions/ Plan   Blood transfusion, 1 unit PRBC  Monitor H&H Will transfuse if hemoglobin less than 8        Anthoney Harada, DNP, [employer] [facility] [employer]

## 2023-06-29 NOTE — Progress Notes (Signed)
1 Day Post-Op Subjective: Denies pain.  No nausea or emesis.  Tolerating Foley.  Objective: Vital signs in last 24 hours: Temp:  [97.4 F (36.3 C)-98.5 F (36.9 C)] 98.5 F (36.9 C) (07/13 0800) Pulse Rate:  [66-131] 70 (07/13 0600) Resp:  [12-32] 22 (07/13 0600) BP: (96-152)/(52-111) 121/52 (07/13 0600) SpO2:  [91 %-99 %] 96 % (07/13 0600) Weight:  [91.2 kg] 91.2 kg (07/12 1248)  Intake/Output from previous day: 07/12 0701 - 07/13 0700 In: 3340.7 [I.V.:2188.5; Blood:310; IV Piggyback:522.2] Out: 1375 [Urine:875; Blood:500] Intake/Output this shift: Total I/O In: 1654.1 [I.V.:1454.1; IV Piggyback:200] Out: -   UOP: 875 mL dark pink  Physical Exam:  General: Alert and oriented CV: RRR Lungs: Clear Abdomen: Soft, ND, nontender Ext: NT, No erythema  Lab Results: Recent Labs    06/28/23 1443 06/28/23 2105 06/29/23 0309  HGB 9.2* 7.0* 8.2*  HCT 35.4* 24.0* 26.8*   BMET Recent Labs    06/28/23 1443 06/29/23 0309  NA 137 138  K 4.3 4.4  CL 108 108  CO2 20* 22  GLUCOSE 139* 97  BUN 25* 21  CREATININE 0.82 0.70  CALCIUM 8.3* 8.3*     Studies/Results: No results found.  Assessment/Plan: Bladder tumor s/p palliative TURBT 06/28/2023, incomplete resection  -Pain control as needed -Regular diet as tolerated -Catheter dark pink on rounds.  This was gently irrigated with 300 mL normal saline with return of only light pink urine, no clots. -Hemoglobin 7.0 at 9:00 last night and was transfused 1 unit.  Appropriate response with hemoglobin 9.2 this morning. -Keep Foley catheter to gravity drainage.  Irrigate manually as needed.  Will not start continuous bladder irrigation given deep resection. -Appreciate hospitalist assistance. -Will plan to keep in house through the weekend   LOS: 0 days   Matt R. Dmetrius Ambs MD 06/29/2023, 9:01 AM [facility]  Pager: [phone]

## 2023-06-30 DIAGNOSIS — D649 Anemia, unspecified: Secondary | ICD-10-CM

## 2023-06-30 DIAGNOSIS — D62 Acute posthemorrhagic anemia: Secondary | ICD-10-CM | POA: Diagnosis not present

## 2023-06-30 DIAGNOSIS — I10 Essential (primary) hypertension: Secondary | ICD-10-CM

## 2023-06-30 DIAGNOSIS — C678 Malignant neoplasm of overlapping sites of bladder: Secondary | ICD-10-CM | POA: Diagnosis not present

## 2023-06-30 LAB — BASIC METABOLIC PANEL
Anion gap: 7 (ref 5–15)
BUN: 15 mg/dL (ref 8–23)
CO2: 23 mmol/L (ref 22–32)
Calcium: 8.2 mg/dL — ABNORMAL LOW (ref 8.9–10.3)
Chloride: 103 mmol/L (ref 98–111)
Creatinine, Ser: 0.64 mg/dL (ref 0.61–1.24)
GFR, Estimated: >60 mL/min (ref >60)
Glucose, Bld: 96 mg/dL (ref 70–99)
Potassium: 3.8 mmol/L (ref 3.5–5.1)
Sodium: 133 mmol/L — ABNORMAL LOW (ref 135–145)

## 2023-06-30 MED ORDER — HALOPERIDOL LACTATE 5 MG/ML IJ SOLN
1.0000 mg | Freq: Once | INTRAMUSCULAR | Status: AC
  Administered 2023-06-30: 1 mg via INTRAVENOUS
  Filled 2023-06-30: qty 1

## 2023-06-30 MED ORDER — POTASSIUM CHLORIDE IN NACL 20-0.45 MEQ/L-% IV SOLN
INTRAVENOUS | Status: AC
  Filled 2023-06-30 (×2): qty 1000

## 2023-06-30 MED ORDER — QUETIAPINE FUMARATE 25 MG PO TABS: 25.0000 mg | ORAL_TABLET | Freq: Every evening | ORAL | Status: DC | PRN

## 2023-06-30 MED ORDER — MELATONIN 3 MG PO TABS
3.0000 mg | ORAL_TABLET | Freq: Every day | ORAL | Status: DC
  Administered 2023-06-30 – 2023-07-01 (×2): 3 mg via ORAL
  Filled 2023-06-30 (×2): qty 1

## 2023-06-30 NOTE — Progress Notes (Signed)
[facility] Consult PROGRESS NOTE    Progress Note  Barry [name]  ZOX:[id number] DOB: Feb 21, 1946 DOA: 06/28/2023 PCP: Georgann Housekeeper, MD     Brief Narrative:   Barry [name] is an 77 y.o. male past medical history paroxysmal atrial fibrillation on Eliquis History of sinusitis, CABG, hyperlipidemia hypertension morbid obesity left-sided paralysis diaphragm ulcerative colitis recently admitted about a month prior to admission for complicated UTI and a bladder mass along with a lower GI bleed in the setting of ulcerative colitis underwent TURBT with Dr. Annabell Howells earlier today and we are being consulted for medical management.  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 Continue to hold apixaban. Monitor hemoglobin and transfuse if symptomatic or less than 8 due to history of coronary artery disease. Status post 1 unit of packed red blood cells. PT OT eval is pending. Out of bed to chair. He is getting narcotics for analgesics start him on a bowel regimen. Further management per urology.  Acute blood loss anemia: Has a history of CABG monitor hemoglobin closely transfuse if hemoglobin less than 8. CBC this morning is pending.  Acute confusional state: Continue melatonin at night Haldol IV as needed. He is at high risk of aspiration  Paroxysmal atrial fibrillation With a chads Vascor of at least 5. Continue oral Cardizem, try to keep potassium greater than 4 magnesium greater than 2. Continue to hold DOAC.  Coronary artery disease: Hold  apixaban. Continue statins daily.  Essential hypertension: Hold oral antihypertensive medication blood pressure is actually quite good around 120/80.  Hyperlipidemia: Continue statins.  GERD: Continue PPI.  Ulcerative of colitis: Continue cefazolin 4 times a day.   DVT prophylaxis: None Family Communication:None Status is: Observation The patient remains OBS appropriate and will d/c before 2 midnights.    Code Status:     Code Status Orders  (From admission, onward)           Start     Ordered   06/28/23 0958  Do not attempt resuscitation (DNR)  Continuous       Question Answer Comment  If patient has no pulse and is not breathing Do Not Attempt Resuscitation   If patient has a pulse and/or is breathing: Medical Treatment Goals LIMITED ADDITIONAL INTERVENTIONS: Use medication/IV fluids and cardiac monitoring as indicated; Do not use intubation or mechanical ventilation (DNI), also provide comfort medications.  Transfer to Progressive/Stepdown as indicated, avoid Intensive Care.   Consent: Discussion documented in EHR or advanced directives reviewed      06/28/23 0957           Code Status History     Date Active Date Inactive Code Status Order ID Comments User Context   06/28/2023 0941 06/28/2023 0957 Full Code [id number]  Bjorn Pippin, MD Inpatient   06/03/2023 1621 06/10/2023 1937 Full Code [id number]  Bobette Mo, MD ED      Advance Directive Documentation    Flowsheet Row Most Recent Value  Type of Advance Directive Healthcare Power of Attorney  Pre-existing out of facility DNR order (yellow form or pink MOST form) --  "MOST" Form in Place? --  IV Access:   Peripheral IV   Procedures and diagnostic studies:   No results found.   Medical Consultants:   None.   Subjective:    Barry [name] nonverbal this morning.  Objective:    Vitals:   06/30/23 0400 06/30/23 0420 06/30/23 0500 06/30/23 0600  BP: (!) 126/57  (!)  143/58 (!) 152/60  Pulse: 82  83 79  Resp: 20  (!) 21 (!) 26  Temp:  98 F (36.7 C)    TempSrc:  Oral    SpO2: 92%  95% 96%  Weight:      Height:       SpO2: 96 % O2 Flow Rate (L/min): 2 L/min   Intake/Output Summary (Last 24 hours) at 06/30/2023 0734 Last data filed at 06/30/2023 0600 Gross per 24 hour  Intake 1835.82 ml  Output 2700 ml  Net -864.18 ml   Filed Weights   06/14/23 1022 06/28/23 0631 06/28/23 1248  Weight: 90.7 kg 90.7 kg 91.2 kg    Exam: General exam: In no acute distress. Respiratory system: Good air movement and clear to auscultation. Cardiovascular system: S1 & S2 heard, RRR. No JVD. Gastrointestinal system: Abdomen is nondistended, soft and nontender.  Extremities: No pedal edema. Skin: No rashes, lesions or ulcers Psychiatry: No judgement and insight and medical condition.  Data Reviewed:    Labs: Basic Metabolic Panel: Recent Labs  Lab 06/28/23 1443 06/29/23 0309  NA 137 138  K 4.3 4.4  CL 108 108  CO2 20* 22  GLUCOSE 139* 97  BUN 25* 21  CREATININE 0.82 0.70  CALCIUM 8.3* 8.3*  MG 1.8  --   PHOS 3.9  --    GFR Estimated Creatinine Clearance: 74.8 mL/min (by C-G formula based on SCr of 0.7 mg/dL). Liver Function Tests: Recent Labs  Lab 06/28/23 1443 06/29/23 0309  AST 35 34  ALT 41 32  ALKPHOS 76 56  BILITOT 0.3 0.4  PROT 5.5* 5.3*  ALBUMIN 2.4* 3.1*   No results for input(s): "LIPASE", "AMYLASE" in the last 168 hours. No results for input(s): "AMMONIA" in the last 168 hours. Coagulation profile No results for input(s): "INR", "PROTIME" in the last 168 hours. COVID-19 Labs  No results for input(s): "DDIMER", "FERRITIN", "LDH", "CRP" in the last 72 hours.  No results found for: "SARSCOV2NAA"  CBC: Recent Labs  Lab 06/28/23 0600 06/28/23 0920 06/28/23 1017 06/28/23 1443 06/28/23 2105 06/29/23 0309  WBC 3.9* 4.1  --   --   --  8.2  HGB 10.8* 10.1* 9.9* 9.2* 7.0* 8.2*  HCT 36.3* 34.2* 34.7* 35.4* 24.0* 26.8*   MCV 93.1 93.2  --   --   --  92.7  PLT 295 328  --   --   --  203   Cardiac Enzymes: No results for input(s): "CKTOTAL", "CKMB", "CKMBINDEX", "TROPONINI" in the last 168 hours. BNP (last 3 results) No results for input(s): "PROBNP" in the last 8760 hours. CBG: Recent Labs  Lab 06/28/23 1245  GLUCAP 126*   D-Dimer: No results for input(s): "DDIMER" in the last 72 hours. Hgb A1c: No results for input(s): "HGBA1C" in the last 72 hours. Lipid Profile: No results for input(s): "CHOL", "HDL", "LDLCALC", "TRIG", "CHOLHDL", "LDLDIRECT" in the last 72 hours. Thyroid function studies: No results for input(s): "TSH", "T4TOTAL", "T3FREE", "THYROIDAB" in the last 72 hours.  Invalid input(s): "FREET3" Anemia work up: No results for input(s): "VITAMINB12", "FOLATE", "FERRITIN", "TIBC", "IRON", "RETICCTPCT" in the last 72 hours. Sepsis Labs: Recent  Labs  Lab 06/28/23 0600 06/28/23 0920 06/29/23 0309  WBC 3.9* 4.1 8.2   Microbiology Recent Results (from the past 240 hour(s))  MRSA Next Gen by PCR, Nasal     Status: None   Collection Time: 06/28/23 12:31 PM   Specimen: Nasal Mucosa; Nasal Swab  Result Value Ref Range Status   MRSA by PCR Next Gen NOT DETECTED NOT DETECTED Final    Comment: (NOTE) The GeneXpert MRSA Assay (FDA approved for NASAL specimens only), is one component of a comprehensive MRSA colonization surveillance program. It is not intended to diagnose MRSA infection nor to guide or monitor treatment for MRSA infections. Test performance is not FDA approved in patients less than 21 years old. Performed at [facility], [address] [place], [place] [address]      Medications:    sodium chloride   Intravenous Once   atorvastatin  40 mg Oral Daily   Chlorhexidine Gluconate Cloth  6 each Topical Daily   cycloSPORINE  1 drop Both Eyes BID   diltiazem  240 mg Oral Daily   escitalopram  20 mg Oral Daily   ezetimibe  10 mg Oral Daily    latanoprost  1 drop Both Eyes QHS   losartan  25 mg Oral Daily   metoprolol tartrate  50 mg Oral BID   mometasone-formoterol  2 puff Inhalation BID   montelukast  10 mg Oral QHS   pantoprazole  40 mg Oral Daily   sulfaSALAzine  1,000 mg Oral QID   Continuous Infusions:  0.45 % NaCl with KCl 20 mEq / L 100 mL/hr at 06/29/23 1854   sodium chloride      ceFAZolin (ANCEF) IV 2 g (06/30/23 0703)   diltiazem (CARDIZEM) infusion Stopped (06/29/23 0808)      LOS: 1 day   Marinda Elk  [facility]  06/30/2023, 7:34 AM

## 2023-06-30 NOTE — Progress Notes (Signed)
    Patient Name: Barry [name]           DOB: 1946-03-19  MRN: [id number]      Admission Date: 06/28/2023  Attending Provider: Bjorn Pippin, MD  Primary Diagnosis: Malignant neoplasm of overlapping sites of bladder [facility])   Level of care: Med-Surg    CROSS COVER NOTE   Date of Service   06/30/2023   Barry [name], 76 y.o. male, was admitted on 06/28/2023 for Malignant neoplasm of overlapping sites of bladder [facility]).    HPI/Events of Note   Delirium Agitated, restless, and intermittently confused Patient attempting to remove medical equipment, pulling at lines despite multiple attempts at redirection and reorientation. Patient received Ambien and Dilaudid earlier tonight.   Interventions/ Plan   Delirium precautions IV Haldol, 1 mg        Anthoney Harada, DNP, [facility] [place]

## 2023-06-30 NOTE — Progress Notes (Signed)
2 Days Post-Op Subjective: Denies pain. Foley very light pink, no clots.  Objective: Vital signs in last 24 hours: Temp:  [98 F (36.7 C)-98.7 F (37.1 C)] 98.1 F (36.7 C) (07/14 0745) Pulse Rate:  [79-92] 79 (07/14 0600) Resp:  [16-30] 26 (07/14 0600) BP: (115-152)/(49-75) 152/60 (07/14 0600) SpO2:  [89 %-97 %] 96 % (07/14 0600)  Intake/Output from previous day: 07/13 0701 - 07/14 0700 In: 1835.8 [I.V.:1635.8; IV Piggyback:200] Out: 2700 [Urine:2700] Intake/Output this shift: Total I/O In: 2133.2 [I.V.:1833.2; IV Piggyback:300] Out: -   Physical Exam:  General: Alert and oriented CV: RRR Lungs: Clear Abdomen: Soft, ND, NT Ext: NT, No erythema  Lab Results: Recent Labs    06/28/23 1443 06/28/23 2105 06/29/23 0309  HGB 9.2* 7.0* 8.2*  HCT 35.4* 24.0* 26.8*   BMET Recent Labs    06/28/23 1443 06/29/23 0309  NA 137 138  K 4.3 4.4  CL 108 108  CO2 20* 22  GLUCOSE 139* 97  BUN 25* 21  CREATININE 0.82 0.70  CALCIUM 8.3* 8.3*     Studies/Results: No results found.  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 Has not required much irrigation, no clots. -Hgb pending. Require 1u transfusion on Friday night and none since -Anticipate discharge home tomorrow with foley in place   LOS: 1 day   Matt R. Payam Gribble MD 06/30/2023, 9:20 AM [facility]  Pager: [phone]

## 2023-06-30 NOTE — Evaluation (Signed)
Physical Therapy Evaluation Patient Details Name: Barry [name] MRN: [id number] DOB: [birth year] Today's Date: 06/30/2023  History of Present Illness  77 yo male admitted with malignant neoplasm of bladder. S/P TURBT 06/28/23. Hx of Afib, CAD, obesity, severe OA, UC, CABG, paralyzed hemidiaphragm, falls  Clinical Impression  On eval, pt required Mod A +2 for safety for bed mobility. He sat EOB for ~3 minutes requiring varying levels of assistance. Repeated losses of balance in posterior and lateral directions. Assisted pt back to bed at his request. Recommend return to facility once medically ready. Patient will benefit from continued inpatient follow up therapy, <3 hours/day.       Assistance Recommended at Discharge Frequent or constant Supervision/Assistance  If plan is discharge home, recommend the following:  Can travel by private vehicle      No    Equipment Recommendations None recommended by PT  Recommendations for Other Services       Functional Status Assessment Patient has had a recent decline in their functional status and/or demonstrates limited ability to make significant improvements in function in a reasonable and predictable amount of time     Precautions / Restrictions Precautions Precautions: Fall Precaution Comments: limited bil knee flexion Restrictions Weight Bearing Restrictions: No      Mobility  Bed Mobility Overal bed mobility: Needs Assistance Bed Mobility: Supine to Sit, Sit to Supine     Supine to sit: Mod assist, HOB elevated Sit to supine: Mod assist, HOB elevated   General bed mobility comments: Assist for trunk, bil LEs, EOB positioning, bed positioning in supine. Increased time. Cues provided. Pt relied on bedrail. Poor static sitting balance with intermittent losses of balance in posterior and lateral directions    Transfers                        Ambulation/Gait                  Stairs             Wheelchair Mobility     Tilt Bed    Modified Rankin (Stroke Patients Only)       Balance Overall balance assessment: Needs assistance   Sitting balance-Leahy Scale: Poor   Postural control: Posterior lean, Right lateral lean                                   Pertinent Vitals/Pain Pain Assessment Pain Assessment: No/denies pain    Home Living Family/patient expects to be discharged to:: Skilled nursing facility     Type of Home: Apartment Home Access: Level entry       Home Layout: One level Home Equipment: Rolling Walker (2 wheels);Grab bars - tub/shower;Grab bars - toilet Additional Comments: pt reports sleeping in lift chair.    Prior Function Prior Level of Function : Needs assist;History of Falls (last six months)             Mobility Comments: minimal ambulation per patient. working with PT at SNF ADLs Comments: needs assist     Hand Dominance   Dominant Hand: Right    Extremity/Trunk Assessment   Upper Extremity Assessment Upper Extremity Assessment: Generalized weakness    Lower Extremity Assessment Lower Extremity Assessment: Generalized weakness RLE Deficits / Details: poor knee flexion. Knee ext at leat 3/5, genu valgus of knees LLE Deficits / Details: poor knee flexion. Knee ext at  leat 3/5, genu valgus of knees    Cervical / Trunk Assessment Cervical / Trunk Assessment: Kyphotic  Communication   Communication: No difficulties  Cognition Arousal/Alertness: Awake/alert Behavior During Therapy: WFL for tasks assessed/performed Overall Cognitive Status: Within Functional Limits for tasks assessed                                          General Comments      Exercises     Assessment/Plan    PT Assessment Patient needs continued PT services  PT Problem List Decreased strength;Decreased range of motion;Decreased activity tolerance;Decreased balance;Decreased mobility;Decreased knowledge of use  of DME;Pain       PT Treatment Interventions DME instruction;Gait training;Functional mobility training;Therapeutic activities;Therapeutic exercise;Balance training;Neuromuscular re-education;Patient/family education    PT Goals (Current goals can be found in the Care Plan section)  Acute Rehab PT Goals Patient Stated Goal: regain strength; go home PT Goal Formulation: With patient Time For Goal Achievement: 07/14/23 Potential to Achieve Goals: Fair    Frequency Min 1X/week     Co-evaluation               AM-PAC PT "6 Clicks" Mobility  Outcome Measure Help needed turning from your back to your side while in a flat bed without using bedrails?: Total Help needed moving from lying on your back to sitting on the side of a flat bed without using bedrails?: Total Help needed moving to and from a bed to a chair (including a wheelchair)?: Total Help needed standing up from a chair using your arms (e.g., wheelchair or bedside chair)?: Total Help needed to walk in hospital room?: Total Help needed climbing 3-5 steps with a railing? : Total 6 Click Score: 6    End of Session   Activity Tolerance: Patient tolerated treatment well Patient left: with call bell/phone within reach;with bed alarm set   PT Visit Diagnosis: Other abnormalities of gait and mobility (R26.89);Muscle weakness (generalized) (M62.81);Pain;History of falling (Z91.81)    Time: 9562-1308 PT Time Calculation (min) (ACUTE ONLY): 13 min   Charges:   PT Evaluation $PT Eval Low Complexity: 1 Low   PT General Charges $$ ACUTE PT VISIT: 1 Visit            Faye Ramsay, PT Acute Rehabilitation  Office: [phone]

## 2023-07-01 LAB — BASIC METABOLIC PANEL
Anion gap: 7 (ref 5–15)
BUN: 15 mg/dL (ref 8–23)
CO2: 24 mmol/L (ref 22–32)
Calcium: 8.3 mg/dL — ABNORMAL LOW (ref 8.9–10.3)
Chloride: 101 mmol/L (ref 98–111)
Creatinine, Ser: 0.63 mg/dL (ref 0.61–1.24)
GFR, Estimated: >60 mL/min (ref >60)
Glucose, Bld: 115 mg/dL — ABNORMAL HIGH (ref 70–99)
Potassium: 4 mmol/L (ref 3.5–5.1)
Sodium: 132 mmol/L — ABNORMAL LOW (ref 135–145)

## 2023-07-01 LAB — SURGICAL PATHOLOGY

## 2023-07-01 NOTE — TOC Progression Note (Signed)
Transition of Care [facility]) - Progression Note    Patient Details  Name: Barry [name] MRN: [id number] Date of Birth: 10/24/1946  Transition of Care [facility]) CM/SW Contact  Harriett Sine, RN Phone Number: 07/01/2023, 11:29 AM  Clinical Narrative:    Contracted Director of Admission at [facility] to confirm that pt is expected to return. FL2 completed, no insurance auth needed. Pt is okay once medically stable.       Expected Discharge Plan and Services                                               Social Determinants of Health (SDOH) Interventions SDOH Screenings   Food Insecurity: No Food Insecurity (06/28/2023)  Housing: Low Risk  (06/28/2023)  Transportation Needs: No Transportation Needs (06/28/2023)  Utilities: Not At Risk (06/28/2023)  Tobacco Use: Low Risk  (06/28/2023)    Readmission Risk Interventions     No data to display

## 2023-07-01 NOTE — Consult Note (Signed)
CONSULT  NOTE     Progress Note  Barry [name]  NGE:[id number] DOB: 1946-10-16 DOA: 06/28/2023 PCP: Georgann Housekeeper, MD     Brief Narrative:   Barry [name] is an 77 y.o. male past medical history paroxysmal atrial fibrillation on Eliquis History of sinusitis, CABG, hyperlipidemia hypertension morbid obesity left-sided paralysis diaphragm ulcerative colitis recently admitted about a month prior to admission for complicated UTI and a bladder mass WHO underwent TURBT with Dr. Annabell Howells 7/12 and we are being consulted for medical management.  The patient relates he is having some lower abdominal tenderness CT scan on 06/03/2023 of the abdomen pelvis with contrast showed multiple masses along the bladder lumen nonobstructive right-sided nephrolithiasis and an absent left kidney   Assessment/Plan:   Gross hematuria status post cystoscopy with transurethral resection of prostatic urethral neoplasm and bladder /malignant neoplasm of overlapping sites of bladder (HCC) With persistent hematuria, foley was replaced this afternoon as patient unable to void Continue to hold apixaban, would plan on holding at discharge and following up with outpatient cardiologist On cefazolin per primary team Monitor hemoglobin and transfuse if symptomatic or less than 8 due to history of coronary artery disease. Will order cbc for tomorrow Status post 1 unit of packed red blood cells. PT advising snf advise return there  Out of bed to chair. Further management per urology.  Acute blood loss anemia: Has a history of CABG monitor hemoglobin closely transfuse if hemoglobin less than 8. Will order cbc for tomorrow, consider transfusion if less than 7  Acute confusional state: resolved  Paroxysmal atrial fibrillation With a chads Vasc of 4 Continue oral Cardizem, try to keep potassium greater than 4 magnesium greater than 2. Continue to hold DOAC, given persistent hematuria think risk of bleeding outweighs short-term  stroke risk  Coronary artery disease: Hold apixaban. Continue statins daily.  Essential hypertension: Bp well controlled on current regimen, continue  Hyperlipidemia: Continue statins.  GERD: Continue PPI.  Ulcerative of colitis: Continue sulfasalazine 4 times a day.   DVT prophylaxis: None Family Communication: friend who is hcpoa updated @ bedside     Code Status:     Code Status Orders  (From admission, onward)           Start     Ordered   06/28/23 0958  Do not attempt resuscitation (DNR)  Continuous       Question Answer Comment  If patient has no pulse and is not breathing Do Not Attempt Resuscitation   If patient has a pulse and/or is breathing: Medical Treatment Goals LIMITED ADDITIONAL INTERVENTIONS: Use medication/IV fluids and cardiac monitoring as indicated; Do not use intubation or mechanical ventilation (DNI), also provide comfort medications.  Transfer to Progressive/Stepdown as indicated, avoid Intensive Care.   Consent: Discussion documented in EHR or advanced directives reviewed      06/28/23 0957           Code Status History     Date Active Date Inactive Code Status Order ID Comments User Context   06/28/2023 0941 06/28/2023 0957 Full Code [id number]  Bjorn Pippin, MD Inpatient   06/03/2023 1621 06/10/2023 1937 Full Code [id number]  Bobette Mo, MD ED      Advance Directive Documentation    Flowsheet Row Most Recent Value  Type of Advance Directive Healthcare Power of Attorney  Pre-existing out of facility DNR order (yellow form or pink MOST form) --  "MOST" Form in Place? --  IV Access:   Peripheral IV   Procedures and diagnostic studies:   No results found.    Subjective:    Barry [name] reports lower abdominal pain resolved with placement of foley, tolerating diet.  Objective:    Vitals:   06/30/23 2141 07/01/23 0414 07/01/23 0846 07/01/23 1341  BP: 129/62 (!) 109/58  104/61  Pulse: 100 78  75   Resp: 16 18  18   Temp: 97.7 F (36.5 C) 98 F (36.7 C)  97.8 F (36.6 C)  TempSrc: Oral Oral  Oral  SpO2: 91% 93% 94% 93%  Weight:      Height:       SpO2: 93 % O2 Flow Rate (L/min): 2 L/min   Intake/Output Summary (Last 24 hours) at 07/01/2023 1458 Last data filed at 07/01/2023 1400 Gross per 24 hour  Intake 1908.99 ml  Output 1832 ml  Net 76.99 ml   Filed Weights   06/14/23 1022 06/28/23 0631 06/28/23 1248  Weight: 90.7 kg 90.7 kg 91.2 kg    Exam: General exam: In no acute distress. Respiratory system: Good air movement and clear to auscultation. Cardiovascular system: S1 & S2 heard, RRR. No JVD. Gastrointestinal system: Abdomen is nondistended, soft and nontender.  Extremities: No pedal edema. Skin: No rashes, lesions or ulcers. pale Psychiatry: calm, no confusion  Data Reviewed:    Labs: Basic Metabolic Panel: Recent Labs  Lab 06/28/23 1443 06/29/23 0309 06/30/23 0821 07/01/23 0525  NA 137 138 133* 132*  K 4.3 4.4 3.8 4.0  CL 108 108 103 101  CO2 20* 22 23 24   GLUCOSE 139* 97 96 115*  BUN 25* 21 15 15   CREATININE 0.82 0.70 0.64 0.63  CALCIUM 8.3* 8.3* 8.2* 8.3*  MG 1.8  --   --   --   PHOS 3.9  --   --   --    GFR Estimated Creatinine Clearance: 74.8 mL/min (by C-G formula based on SCr of 0.63 mg/dL). Liver Function Tests: Recent Labs  Lab 06/28/23 1443 06/29/23 0309  AST 35 34  ALT 41 32  ALKPHOS 76 56  BILITOT 0.3 0.4  PROT 5.5* 5.3*  ALBUMIN 2.4* 3.1*   No results for input(s): "LIPASE", "AMYLASE" in the last 168 hours. No results for input(s): "AMMONIA" in the last 168 hours. Coagulation profile No results for input(s): "INR", "PROTIME" in the last 168 hours. COVID-19 Labs  No results for input(s): "DDIMER", "FERRITIN", "LDH", "CRP" in the last 72 hours.  No results found for: "SARSCOV2NAA"  CBC: Recent Labs  Lab 06/28/23 0600 06/28/23 0920 06/28/23 1017 06/28/23 1443 06/28/23 2105 06/29/23 0309  WBC 3.9* 4.1  --   --    --  8.2  HGB 10.8* 10.1* 9.9* 9.2* 7.0* 8.2*  HCT 36.3* 34.2* 34.7* 35.4* 24.0* 26.8*  MCV 93.1 93.2  --   --   --  92.7  PLT 295 328  --   --   --  203   Cardiac Enzymes: No results for input(s): "CKTOTAL", "CKMB", "CKMBINDEX", "TROPONINI" in the last 168 hours. BNP (last 3 results) No results for input(s): "PROBNP" in the last 8760 hours. CBG: Recent Labs  Lab 06/28/23 1245  GLUCAP 126*   D-Dimer: No results for input(s): "DDIMER" in the last 72 hours. Hgb A1c: No results for input(s): "HGBA1C" in the last 72 hours. Lipid Profile: No results for input(s): "CHOL", "HDL", "LDLCALC", "TRIG", "CHOLHDL", "LDLDIRECT" in the last 72 hours. Thyroid function studies: No results for input(s): "TSH", "  T4TOTAL", "T3FREE", "THYROIDAB" in the last 72 hours.  Invalid input(s): "FREET3" Anemia work up: No results for input(s): "VITAMINB12", "FOLATE", "FERRITIN", "TIBC", "IRON", "RETICCTPCT" in the last 72 hours. Sepsis Labs: Recent Labs  Lab 06/28/23 0600 06/28/23 0920 06/29/23 0309  WBC 3.9* 4.1 8.2   Microbiology Recent Results (from the past 240 hour(s))  MRSA Next Gen by PCR, Nasal     Status: None   Collection Time: 06/28/23 12:31 PM   Specimen: Nasal Mucosa; Nasal Swab  Result Value Ref Range Status   MRSA by PCR Next Gen NOT DETECTED NOT DETECTED Final    Comment: (NOTE) The GeneXpert MRSA Assay (FDA approved for NASAL specimens only), is one component of a comprehensive MRSA colonization surveillance program. It is not intended to diagnose MRSA infection nor to guide or monitor treatment for MRSA infections. Test performance is not FDA approved in patients less than 23 years old. Performed at [facility], [address], [place], [place] [address]      Medications:    atorvastatin  40 mg Oral Daily   Chlorhexidine Gluconate Cloth  6 each Topical Daily   cycloSPORINE  1 drop Both Eyes BID   diltiazem  240 mg Oral Daily   escitalopram  20 mg  Oral Daily   ezetimibe  10 mg Oral Daily   latanoprost  1 drop Both Eyes QHS   losartan  25 mg Oral Daily   melatonin  3 mg Oral QHS   metoprolol tartrate  50 mg Oral BID   mometasone-formoterol  2 puff Inhalation BID   montelukast  10 mg Oral QHS   pantoprazole  40 mg Oral Daily   sulfaSALAzine  1,000 mg Oral QID   Continuous Infusions:   ceFAZolin (ANCEF) IV 2 g (07/01/23 0542)      LOS: 2 days   Silvano Bilis  [facility]  07/01/2023, 2:58 PM

## 2023-07-01 NOTE — Plan of Care (Signed)

## 2023-07-01 NOTE — TOC Progression Note (Signed)
Transition of Care [facility] - Progression Note    Patient Details  Name: Barry [name] MRN: [id number] Date of Birth: 1946-03-14  Transition of Care [facility]) CM/SW Contact  Beckie Busing, RN Phone [phone]  07/01/2023, 3:02 PM  Clinical Narrative:    Cm received message to ask if patient can return to SNF today. CM has confirmed with Durward Mallard at [facility] that patient is able to return today. MD notified.         Expected Discharge Plan and Services                                               Social Determinants of Health (SDOH) Interventions SDOH Screenings   Food Insecurity: No Food Insecurity (06/28/2023)  Housing: Low Risk  (06/28/2023)  Transportation Needs: No Transportation Needs (06/28/2023)  Utilities: Not At Risk (06/28/2023)  Tobacco Use: Low Risk  (06/28/2023)    Readmission Risk Interventions     No data to display

## 2023-07-01 NOTE — Progress Notes (Signed)
   3 Days Post-Op Subjective: 7/15: NAEON. Pt resting on rounds with no specific urological complaints.   Objective: Vital signs in last 24 hours: Temp:  [97.7 F (36.5 C)-98.3 F (36.8 C)] 98 F (36.7 C) (07/15 0414) Pulse Rate:  [72-100] 78 (07/15 0414) Resp:  [16-18] 18 (07/15 0414) BP: (105-151)/(55-68) 109/58 (07/15 0414) SpO2:  [91 %-95 %] 93 % (07/15 0414)  Intake/Output from previous day: 07/14 0701 - 07/15 0700 In: 4092.2 [P.O.:240; I.V.:3291.4; IV Piggyback:510.8] Out: 2500 [Urine:2500]  Intake/Output this shift: Total I/O In: -  Out: 550 [Urine:550]  Physical Exam:  General: Alert and oriented CV: No cyanosis Lungs: equal chest rise Abdomen: Soft, NTND, no rebound or guarding Gu: 22f 3-way foley cath in place draining clear yellow urine with some old blood present in tubing.   Lab Results: Recent Labs    06/28/23 1443 06/28/23 2105 06/29/23 0309  HGB 9.2* 7.0* 8.2*  HCT 35.4* 24.0* 26.8*   BMET Recent Labs    06/30/23 0821 07/01/23 0525  NA 133* 132*  K 3.8 4.0  CL 103 101  CO2 23 24  GLUCOSE 96 115*  BUN 15 15  CREATININE 0.64 0.63  CALCIUM 8.2* 8.3*     Studies/Results: No results found.  Assessment/Plan: #Hematuria #Solitary right kidney # Nephrolithiasis #Bladder Cancer #S/p TURBT  Extensive but incomplete resection of tumor covering prostatic urethra and bladder. Patient has follow-up scheduled for 7/22.  Will reassess and discuss scheduling next surgery at that time. Patient would be high risk for extensive bleeding if he were to resume Eliquis at this time. Clot obstruction of urine following tumor resection would put him at risk for bladder rupture. Avoid anticoagulating if medically possible. Foley catheter removed at bedside this morning.  If patient voids he is okay to discharge from urologic perspective. Urology will continue to follow.   LOS: 2 days   Elmon Kirschner, NP [facility] Pager:  [phone]  07/01/2023, 8:37 AM

## 2023-07-01 NOTE — [place] FL2 (Signed)
[place] MEDICAID FL2 LEVEL OF CARE FORM     IDENTIFICATION  Patient Name: Barry [name] Birthdate: [birth year] Sex: male Admission Date (Current Location): 06/28/2023  [facility] and [place] Number:  Producer, television/film/video and Address:  [facility],  [address] [place], [place] [address]      Provider Number: [id number]  Attending Physician Name and Address:  Bjorn Pippin, MD  Relative Name and Phone Number:  Veneta Penton [phone]    Current Level of Care: Hospital Recommended Level of Care:   Prior Approval Number:    Date Approved/Denied:   PASRR Number: [id number]  Discharge Plan: SNF    Current Diagnoses: Patient Active Problem List   Diagnosis Date Noted   Malignant neoplasm of overlapping sites of bladder (HCC) 06/28/2023   Gross hematuria 06/28/2023   Class 2 obesity 06/28/2023   ABLA (acute blood loss anemia) 06/28/2023   Paroxysmal atrial fibrillation with RVR (HCC) 06/28/2023   Physical deconditioning 06/05/2023   Effusion of left knee 06/05/2023   Preoperative cardiovascular examination 06/04/2023   Hx of CABG 06/04/2023   Bladder mass 06/03/2023   Pressure injury of skin 06/03/2023   Lower GI bleed 06/03/2023   PAF (paroxysmal atrial fibrillation) (HCC) 01/11/2023   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 (HCC) 01/11/2023   Seborrheic dermatitis 09/02/2020   CAD (coronary artery disease), native coronary artery 02/06/2019   Benign essential HTN 02/06/2019   Hyperlipidemia LDL goal <70 02/06/2019   Excessive ear wax, bilateral 09/15/2018   Acute sinusitis 03/24/2012   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   Chronic rhinitis 05/02/2009   GASTROESOPHAGEAL REFLUX DISEASE 08/27/2008   Severe obesity (BMI >= 40) (HCC) 11/24/2007   Asthma, chronic 11/24/2007   ULCERATIVE COLITIS 11/24/2007    Orientation RESPIRATION BLADDER Height & Weight     Self, Time, Situation, Place  Normal Incontinent Weight: 91.2 kg Height:  5'  3" (160 cm)  BEHAVIORAL SYMPTOMS/MOOD NEUROLOGICAL BOWEL NUTRITION STATUS   (none)  (none) Incontinent Diet (heart healthy)  AMBULATORY STATUS COMMUNICATION OF NEEDS Skin   Extensive Assist Verbally  (pressure injury, bilateral stage 2,)                       Personal Care Assistance Level of Assistance  Bathing, Feeding, Dressing Bathing Assistance: Maximum assistance Feeding assistance: Maximum assistance Dressing Assistance: Maximum assistance     Functional Limitations Info  Sight Sight Info: Impaired Hearing Info: Adequate Speech Info: Adequate    SPECIAL CARE FACTORS FREQUENCY  OT (By licensed OT), PT (By licensed PT)     PT Frequency: 5X/week OT Frequency: 5X /week            Contractures Contractures Info: Present Verner Mould [phone])    Additional Factors Info  Code Status (DNR) Code Status Info: DNR Allergies Info: PIROXICAM           Current Medications (07/01/2023):  This is the current hospital active medication list Current Facility-Administered Medications  Medication Dose Route Frequency Provider Last Rate Last Admin   acetaminophen (TYLENOL) tablet 650 mg  650 mg Oral Q4H PRN Bjorn Pippin, MD       albuterol (PROVENTIL) (2.5 MG/3ML) 0.083% nebulizer solution 2.5 mg  2.5 mg Inhalation Q4H PRN Bjorn Pippin, MD       atorvastatin (LIPITOR) tablet 40 mg  40 mg Oral Daily Bjorn Pippin, MD   40 mg at 07/01/23 0933   bisacodyl (DULCOLAX) suppository 10 mg  10 mg Rectal Daily  PRN Bjorn Pippin, MD       ceFAZolin (ANCEF) IVPB 2g/100 mL premix  2 g Intravenous Q8H Bjorn Pippin, MD 200 mL/hr at 07/01/23 0542 2 g at 07/01/23 0542   Chlorhexidine Gluconate Cloth 2 % PADS 6 each  6 each Topical Daily Bjorn Pippin, MD   6 each at 07/01/23 0933   cycloSPORINE (RESTASIS) 0.05 % ophthalmic emulsion 1 drop  1 drop Both Eyes BID Bjorn Pippin, MD   1 drop at 07/01/23 0933   diltiazem (CARDIZEM CD) 24 hr capsule 240 mg  240 mg Oral Daily Marinda Elk, MD    240 mg at 07/01/23 0932   escitalopram (LEXAPRO) tablet 20 mg  20 mg Oral Daily Bjorn Pippin, MD   20 mg at 07/01/23 0933   ezetimibe (ZETIA) tablet 10 mg  10 mg Oral Daily Bjorn Pippin, MD   10 mg at 07/01/23 0933   HYDROmorphone (DILAUDID) injection 0.5-1 mg  0.5-1 mg Intravenous Q2H PRN Bjorn Pippin, MD   1 mg at 06/29/23 2356   latanoprost (XALATAN) 0.005 % ophthalmic solution 1 drop  1 drop Both Eyes QHS Bjorn Pippin, MD   1 drop at 06/30/23 2159   losartan (COZAAR) tablet 25 mg  25 mg Oral Daily Bjorn Pippin, MD   25 mg at 07/01/23 0931   melatonin tablet 3 mg  3 mg Oral QHS Marinda Elk, MD   3 mg at 06/30/23 2143   metoprolol tartrate (LOPRESSOR) tablet 50 mg  50 mg Oral BID Bjorn Pippin, MD   50 mg at 07/01/23 0931   mometasone-formoterol (DULERA) 200-5 MCG/ACT inhaler 2 puff  2 puff Inhalation BID Marinda Elk, MD   2 puff at 07/01/23 0846   montelukast (SINGULAIR) tablet 10 mg  10 mg Oral QHS Bjorn Pippin, MD   10 mg at 06/30/23 2142   nitroGLYCERIN (NITROSTAT) SL tablet 0.4 mg  0.4 mg Sublingual Q5 min PRN Bjorn Pippin, MD       ondansetron [place]) injection 4 mg  4 mg Intravenous Q4H PRN Bjorn Pippin, MD       Oral care mouth rinse  15 mL Mouth Rinse PRN Bjorn Pippin, MD       oxyCODONE (Oxy IR/ROXICODONE) immediate release tablet 5 mg  5 mg Oral Q4H PRN Bjorn Pippin, MD   5 mg at 06/30/23 2146   pantoprazole (PROTONIX) EC tablet 40 mg  40 mg Oral Daily Bobette Mo, MD   40 mg at 07/01/23 0932   polyvinyl alcohol (LIQUIFILM TEARS) 1.4 % ophthalmic solution 1 drop  1 drop Both Eyes Daily PRN Bjorn Pippin, MD       QUEtiapine (SEROQUEL) tablet 25 mg  25 mg Oral QHS PRN Marinda Elk, MD       senna-docusate (Senokot-S) tablet 1 tablet  1 tablet Oral QHS PRN Bjorn Pippin, MD       sodium phosphate (FLEET) 7-19 GM/118ML enema 1 enema  1 enema Rectal Once PRN Bjorn Pippin, MD       sulfaSALAzine (AZULFIDINE) tablet 1,000 mg  1,000 mg Oral QID Bjorn Pippin, MD   1,000 mg  at 07/01/23 0932   zolpidem (AMBIEN) tablet 5 mg  5 mg Oral QHS PRN Bjorn Pippin, MD   5 mg at 06/29/23 2218     Discharge Medications: Please see discharge summary for a list of discharge medications.  Relevant Imaging Results:  Relevant Lab Results:   Additional Information ss# [id number]  Harriett Sine,  RN

## 2023-07-02 DIAGNOSIS — J9811 Atelectasis: Secondary | ICD-10-CM | POA: Diagnosis not present

## 2023-07-02 DIAGNOSIS — Z515 Encounter for palliative care: Secondary | ICD-10-CM | POA: Diagnosis not present

## 2023-07-02 DIAGNOSIS — T83511A Infection and inflammatory reaction due to indwelling urethral catheter, initial encounter: Secondary | ICD-10-CM | POA: Diagnosis not present

## 2023-07-02 DIAGNOSIS — Z209 Contact with and (suspected) exposure to unspecified communicable disease: Secondary | ICD-10-CM | POA: Diagnosis not present

## 2023-07-02 DIAGNOSIS — K922 Gastrointestinal hemorrhage, unspecified: Secondary | ICD-10-CM | POA: Diagnosis not present

## 2023-07-02 DIAGNOSIS — I499 Cardiac arrhythmia, unspecified: Secondary | ICD-10-CM | POA: Diagnosis not present

## 2023-07-02 DIAGNOSIS — N3289 Other specified disorders of bladder: Secondary | ICD-10-CM | POA: Diagnosis not present

## 2023-07-02 DIAGNOSIS — R41841 Cognitive communication deficit: Secondary | ICD-10-CM | POA: Diagnosis not present

## 2023-07-02 DIAGNOSIS — F062 Psychotic disorder with delusions due to known physiological condition: Secondary | ICD-10-CM | POA: Diagnosis not present

## 2023-07-02 DIAGNOSIS — D649 Anemia, unspecified: Secondary | ICD-10-CM | POA: Diagnosis not present

## 2023-07-02 DIAGNOSIS — N390 Urinary tract infection, site not specified: Secondary | ICD-10-CM | POA: Diagnosis not present

## 2023-07-02 DIAGNOSIS — N138 Other obstructive and reflux uropathy: Secondary | ICD-10-CM | POA: Diagnosis not present

## 2023-07-02 DIAGNOSIS — Z483 Aftercare following surgery for neoplasm: Secondary | ICD-10-CM | POA: Diagnosis not present

## 2023-07-02 DIAGNOSIS — C689 Malignant neoplasm of urinary organ, unspecified: Secondary | ICD-10-CM | POA: Diagnosis not present

## 2023-07-02 DIAGNOSIS — Z1152 Encounter for screening for COVID-19: Secondary | ICD-10-CM | POA: Diagnosis not present

## 2023-07-02 DIAGNOSIS — R2681 Unsteadiness on feet: Secondary | ICD-10-CM | POA: Diagnosis not present

## 2023-07-02 DIAGNOSIS — I4891 Unspecified atrial fibrillation: Secondary | ICD-10-CM | POA: Diagnosis not present

## 2023-07-02 DIAGNOSIS — D509 Iron deficiency anemia, unspecified: Secondary | ICD-10-CM | POA: Diagnosis present

## 2023-07-02 DIAGNOSIS — K219 Gastro-esophageal reflux disease without esophagitis: Secondary | ICD-10-CM | POA: Diagnosis not present

## 2023-07-02 DIAGNOSIS — Z7401 Bed confinement status: Secondary | ICD-10-CM | POA: Diagnosis not present

## 2023-07-02 DIAGNOSIS — R0989 Other specified symptoms and signs involving the circulatory and respiratory systems: Secondary | ICD-10-CM | POA: Diagnosis not present

## 2023-07-02 DIAGNOSIS — Z66 Do not resuscitate: Secondary | ICD-10-CM | POA: Diagnosis present

## 2023-07-02 DIAGNOSIS — E785 Hyperlipidemia, unspecified: Secondary | ICD-10-CM | POA: Diagnosis not present

## 2023-07-02 DIAGNOSIS — I2510 Atherosclerotic heart disease of native coronary artery without angina pectoris: Secondary | ICD-10-CM | POA: Diagnosis not present

## 2023-07-02 DIAGNOSIS — B9689 Other specified bacterial agents as the cause of diseases classified elsewhere: Secondary | ICD-10-CM | POA: Diagnosis not present

## 2023-07-02 DIAGNOSIS — E669 Obesity, unspecified: Secondary | ICD-10-CM | POA: Diagnosis present

## 2023-07-02 DIAGNOSIS — J984 Other disorders of lung: Secondary | ICD-10-CM | POA: Diagnosis not present

## 2023-07-02 DIAGNOSIS — R627 Adult failure to thrive: Secondary | ICD-10-CM | POA: Diagnosis present

## 2023-07-02 DIAGNOSIS — K529 Noninfective gastroenteritis and colitis, unspecified: Secondary | ICD-10-CM | POA: Diagnosis not present

## 2023-07-02 DIAGNOSIS — R0689 Other abnormalities of breathing: Secondary | ICD-10-CM | POA: Diagnosis not present

## 2023-07-02 DIAGNOSIS — R000 Tachycardia, unspecified: Secondary | ICD-10-CM | POA: Diagnosis not present

## 2023-07-02 DIAGNOSIS — R339 Retention of urine, unspecified: Secondary | ICD-10-CM | POA: Diagnosis not present

## 2023-07-02 DIAGNOSIS — B9620 Unspecified Escherichia coli [E. coli] as the cause of diseases classified elsewhere: Secondary | ICD-10-CM | POA: Diagnosis present

## 2023-07-02 DIAGNOSIS — M1990 Unspecified osteoarthritis, unspecified site: Secondary | ICD-10-CM | POA: Diagnosis not present

## 2023-07-02 DIAGNOSIS — C678 Malignant neoplasm of overlapping sites of bladder: Secondary | ICD-10-CM | POA: Diagnosis not present

## 2023-07-02 DIAGNOSIS — E8721 Acute metabolic acidosis: Secondary | ICD-10-CM | POA: Diagnosis present

## 2023-07-02 DIAGNOSIS — K5190 Ulcerative colitis, unspecified, without complications: Secondary | ICD-10-CM | POA: Diagnosis present

## 2023-07-02 DIAGNOSIS — D62 Acute posthemorrhagic anemia: Secondary | ICD-10-CM | POA: Diagnosis not present

## 2023-07-02 DIAGNOSIS — I10 Essential (primary) hypertension: Secondary | ICD-10-CM | POA: Diagnosis not present

## 2023-07-02 DIAGNOSIS — I6782 Cerebral ischemia: Secondary | ICD-10-CM | POA: Diagnosis not present

## 2023-07-02 DIAGNOSIS — G9341 Metabolic encephalopathy: Secondary | ICD-10-CM | POA: Diagnosis present

## 2023-07-02 DIAGNOSIS — J45909 Unspecified asthma, uncomplicated: Secondary | ICD-10-CM | POA: Diagnosis not present

## 2023-07-02 DIAGNOSIS — Z7189 Other specified counseling: Secondary | ICD-10-CM | POA: Diagnosis not present

## 2023-07-02 DIAGNOSIS — H409 Unspecified glaucoma: Secondary | ICD-10-CM | POA: Diagnosis not present

## 2023-07-02 DIAGNOSIS — E871 Hypo-osmolality and hyponatremia: Secondary | ICD-10-CM | POA: Diagnosis present

## 2023-07-02 DIAGNOSIS — E872 Acidosis, unspecified: Secondary | ICD-10-CM | POA: Diagnosis present

## 2023-07-02 DIAGNOSIS — R310 Gross hematuria: Secondary | ICD-10-CM | POA: Diagnosis not present

## 2023-07-02 DIAGNOSIS — I959 Hypotension, unspecified: Secondary | ICD-10-CM | POA: Diagnosis not present

## 2023-07-02 DIAGNOSIS — R531 Weakness: Secondary | ICD-10-CM | POA: Diagnosis not present

## 2023-07-02 DIAGNOSIS — E876 Hypokalemia: Secondary | ICD-10-CM | POA: Diagnosis present

## 2023-07-02 DIAGNOSIS — C679 Malignant neoplasm of bladder, unspecified: Secondary | ICD-10-CM | POA: Diagnosis present

## 2023-07-02 DIAGNOSIS — R4182 Altered mental status, unspecified: Secondary | ICD-10-CM | POA: Diagnosis not present

## 2023-07-02 DIAGNOSIS — Y846 Urinary catheterization as the cause of abnormal reaction of the patient, or of later complication, without mention of misadventure at the time of the procedure: Secondary | ICD-10-CM | POA: Diagnosis present

## 2023-07-02 DIAGNOSIS — R5381 Other malaise: Secondary | ICD-10-CM | POA: Diagnosis not present

## 2023-07-02 DIAGNOSIS — M6281 Muscle weakness (generalized): Secondary | ICD-10-CM | POA: Diagnosis not present

## 2023-07-02 DIAGNOSIS — E860 Dehydration: Secondary | ICD-10-CM | POA: Diagnosis present

## 2023-07-02 DIAGNOSIS — R1312 Dysphagia, oropharyngeal phase: Secondary | ICD-10-CM | POA: Diagnosis not present

## 2023-07-02 DIAGNOSIS — I480 Paroxysmal atrial fibrillation: Secondary | ICD-10-CM | POA: Diagnosis present

## 2023-07-02 DIAGNOSIS — R54 Age-related physical debility: Secondary | ICD-10-CM | POA: Diagnosis present

## 2023-07-02 DIAGNOSIS — Z7901 Long term (current) use of anticoagulants: Secondary | ICD-10-CM | POA: Diagnosis not present

## 2023-07-02 DIAGNOSIS — D494 Neoplasm of unspecified behavior of bladder: Secondary | ICD-10-CM | POA: Diagnosis not present

## 2023-07-02 DIAGNOSIS — R1084 Generalized abdominal pain: Secondary | ICD-10-CM | POA: Diagnosis not present

## 2023-07-02 LAB — BASIC METABOLIC PANEL
Anion gap: 6 (ref 5–15)
BUN: 18 mg/dL (ref 8–23)
CO2: 25 mmol/L (ref 22–32)
Calcium: 8.3 mg/dL — ABNORMAL LOW (ref 8.9–10.3)
Chloride: 101 mmol/L (ref 98–111)
Creatinine, Ser: 0.71 mg/dL (ref 0.61–1.24)
GFR, Estimated: >60 mL/min (ref >60)
Glucose, Bld: 118 mg/dL — ABNORMAL HIGH (ref 70–99)
Potassium: 4.1 mmol/L (ref 3.5–5.1)
Sodium: 132 mmol/L — ABNORMAL LOW (ref 135–145)

## 2023-07-02 LAB — BPAM RBC
Blood Product Expiration Date: 202408072359
Blood Product Expiration Date: 202408072359
ISSUE DATE / TIME: 202407121033
ISSUE DATE / TIME: 202407122237
Unit Type and Rh: 6200
Unit Type and Rh: 6200

## 2023-07-02 LAB — TYPE AND SCREEN
ABO/RH(D): A POS
Antibody Screen: NEGATIVE
Unit division: 0
Unit division: 0

## 2023-07-02 LAB — CBC
HCT: 26.6 % — ABNORMAL LOW (ref 39.0–52.0)
Hemoglobin: 8.2 g/dL — ABNORMAL LOW (ref 13.0–17.0)
MCH: 28 pg (ref 26.0–34.0)
MCHC: 30.8 g/dL (ref 30.0–36.0)
MCV: 90.8 fL (ref 80.0–100.0)
Platelets: 167 10*3/uL (ref 150–400)
RBC: 2.93 MIL/uL — ABNORMAL LOW (ref 4.22–5.81)
RDW: 17.6 % — ABNORMAL HIGH (ref 11.5–15.5)
WBC: 6.2 10*3/uL (ref 4.0–10.5)
nRBC: 0 % (ref 0.0–0.2)

## 2023-07-02 MED ORDER — PANTOPRAZOLE SODIUM 40 MG PO TBEC: 40.0000 mg | DELAYED_RELEASE_TABLET | Freq: Every day | ORAL | Status: DC

## 2023-07-02 MED ORDER — QUETIAPINE FUMARATE 25 MG PO TABS: 25.0000 mg | ORAL_TABLET | Freq: Every evening | ORAL | 0 refills | Status: DC | PRN

## 2023-07-02 MED ORDER — CHOLESTYRAMINE 4 G PO PACK: 1.0000 | PACK | Freq: Two times a day (BID) | ORAL | 11 refills | Status: DC

## 2023-07-02 MED ORDER — ESCITALOPRAM OXALATE 20 MG PO TABS: 20.0000 mg | ORAL_TABLET | Freq: Every day | ORAL | 0 refills | Status: DC

## 2023-07-02 NOTE — Progress Notes (Incomplete)
HOSPITALIST CONSULT NOTE Barry [name] QIH:[id number]  DOB: 1946/03/15  DOA: 06/28/2023  PCP: Georgann Housekeeper, MD  07/02/2023,7:39 AM   LOS: 3 days      Code Status: DNR   From:  ***    Current Dispo: ***     34 black male permanent A-fib/Eliquis CHADVASC >4 CAD-CABG 2001--orthostatic hypotension--- stable echo 12/2022 no ischemia nuclear test 2023 Ulcerative colitis-last colonoscopy 2021 diverticulosis benign polyps (Dr. 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 Raynelle Jan as well as Dr.  Roosvelt Harps  ***   DVT prophylaxis: ***  Status is: Inpatient {Inpatient:23812}    Subjective: ***  Objective + exam Vitals:   07/01/23 2005 07/01/23 2137 07/01/23 2300 07/02/23 0517  BP:  118/64  114/62  Pulse:  (!) 102  79  Resp:  18  16  Temp:  98.4 F (36.9 C)  98.1 F (36.7 C)  TempSrc:  Oral  Oral  SpO2: 93% (!) 89% 93% 93%  Weight:      Height:       Filed Weights   06/14/23 1022 06/28/23 0631 06/28/23 1248  Weight: 90.7 kg 90.7 kg 91.2 kg    Examination:    Data Reviewed: reviewed   CBC    Component Value Date/Time   WBC 6.2 07/02/2023 0524   RBC 2.93 (L) 07/02/2023 0524   HGB 8.2 (L) 07/02/2023 0524   HGB 11.6 (L) 02/12/2023 1114   HCT 26.6 (L) 07/02/2023 0524   HCT 35.9 (L) 02/12/2023 1114   PLT 167 07/02/2023 0524   PLT 245 02/12/2023 1114   MCV 90.8 07/02/2023 0524   MCV 93 02/12/2023 1114   MCH 28.0 07/02/2023 0524   MCHC 30.8 07/02/2023 0524   RDW 17.6 (H) 07/02/2023 0524   RDW 14.9 02/12/2023 1114   LYMPHSABS 0.6 (L) 06/09/2023 0537   MONOABS 0.6 06/09/2023  0537   EOSABS 0.1 06/09/2023 0537   BASOSABS 0.0 06/09/2023 0537      Latest Ref Rng & Units 07/02/2023    5:24 AM 07/01/2023    5:25 AM 06/30/2023    8:21 AM  CMP  Glucose 70 - 99 mg/dL 875  643  96   BUN 8 - 23 mg/dL 18  15  15    Creatinine 0.61 - 1.24 mg/dL 3.29  5.18  8.41   Sodium 135 - 145 mmol/L 132  132  133   Potassium 3.5 - 5.1 mmol/L 4.1  4.0  3.8   Chloride 98 - 111 mmol/L 101  101  103   CO2 22 - 32 mmol/L 25  24  23    Calcium 8.9 - 10.3 mg/dL 8.3  8.3  8.2      Scheduled Meds:  atorvastatin  40 mg Oral Daily   Chlorhexidine Gluconate Cloth  6 each Topical Daily   cycloSPORINE  1 drop Both Eyes BID   diltiazem  240 mg Oral Daily   escitalopram  20 mg Oral Daily   ezetimibe  10 mg Oral Daily   latanoprost  1 drop Both Eyes QHS   losartan  25 mg Oral Daily   melatonin  3 mg Oral QHS   metoprolol tartrate  50 mg Oral BID   mometasone-formoterol  2 puff Inhalation BID   montelukast  10 mg Oral QHS   pantoprazole  40 mg Oral Daily   sulfaSALAzine  1,000 mg Oral QID   Continuous Infusions:   ceFAZolin (ANCEF) IV 2 g (07/02/23 0543)    Time  ***  Rhetta Mura, MD  [facility]

## 2023-07-02 NOTE — Procedures (Signed)
Urology Procedure Note  Foley cath Placement: 90f  Barry [name] was undergoing trial of void from this morning and unfortunately found himself unable to urinate any amount by midday.  Bladder scan only showed about 350cc but patient was becoming uncomfortable and the shared decision was made to replace his Foley catheter.  Patient was prepped and draped in the usual sterile fashion.  Following this 10cc of sterile lubricant was injected directly into the urethra.  Next a 5f straight catheter was advanced into the bladder without difficulty.  There was immediate return of clear reddish-brown urine which appeared to be mostly old blood.  The balloon was inflated with 10 cc of sterile water.  Retention strap was placed on the thigh and Foley was set to gravity drainage without dependent loops.  Around 525 cc returned.  Foley catheter to stay in place through discharge.  Trial of void can be completed in office

## 2023-07-02 NOTE — TOC Transition Note (Signed)
Transition of Care [facility]) - CM/SW Discharge Note   Patient Details  Name: Barry [name] MRN: [id number] Date of Birth: 1946-09-16  Transition of Care [facility]) CM/SW Contact:  Harriett Sine, RN Phone Number: 07/02/2023, 3:23 PM   Clinical Narrative:  Pt d/c to [facility] [phone] via [facility]. Nurse notified to call report. Spoke with pt and friend Consuella Lose about transportation and d/c status and the need to make arrangements with facility and insurance concerning LTC. D/C packet with DNR at nurses station.          Patient Goals and CMS Choice      Discharge Placement                         Discharge Plan and Services Additional resources added to the After Visit Summary for                                       Social Determinants of Health (SDOH) Interventions SDOH Screenings   Food Insecurity: No Food Insecurity (06/28/2023)  Housing: Low Risk  (06/28/2023)  Transportation Needs: No Transportation Needs (06/28/2023)  Utilities: Not At Risk (06/28/2023)  Tobacco Use: Low Risk  (06/28/2023)     Readmission Risk Interventions     No data to display

## 2023-07-02 NOTE — Discharge Summary (Signed)
Physician Discharge Summary  Patient ID: Barry [name] MRN: [id number] DOB/AGE: [birth year] 77 y.o.  Admit date: 06/28/2023 Discharge date: 07/02/2023  Admission Diagnoses:  Malignant neoplasm of overlapping sites of bladder [facility])  Discharge Diagnoses:  Principal Problem:   Malignant neoplasm of overlapping sites of bladder (HCC) Active Problems:   GASTROESOPHAGEAL REFLUX DISEASE   ULCERATIVE COLITIS   CAD (coronary artery disease), native coronary artery   Benign essential HTN   Hyperlipidemia LDL goal <70   Gross hematuria   Class 2 obesity   ABLA (acute blood loss anemia)   Paroxysmal atrial fibrillation with RVR (HCC)   Past Medical History:  Diagnosis Date   A-fib (HCC)    Acute sinusitis 03/24/2012   Followed in Pulmonary clinic/ [facility]/ [name] - CT sinus 08/07/2013  Short air-fluid levels in the maxillary sinuses bilaterally suggesting early acute sinusitis. - repeat ct sinus 04/30/2014 > ok     Acute sinusitis, unspecified 08/14/2010   Qualifier: Diagnosis of  By: Clent Ridges NP, [name]     Anemia    Asthma    PFTs 06/15/05 FEV1 85% predicted ratio 68% and truncation of resp loop in a sawtooth pattern. HFA 25% 08-01-2009 >50% Nov 01, 2009>50% 01-31-2010   Asthma, chronic 11/24/2007   Followed in Pulmonary clinic/ [facility]/ [name]  - PFTs 06/15/2005 FEV1 85% predicted ratio 68% and truncation of respiratory loop in a sawtooth pattern  - HFA 25% August 01, 2009 > 50% November 01, 2009 > 50% January 31, 2010 therefore changed to neb bud/brovana - HFA 75% p extensive coaching 08/07/2013  > insurance issues so try symbicort 160 2 bid instead of neb - 11/16/2015   p extensive   Bladder mass    BRONCHITIS, ACUTE 01/22/2011   Qualifier: Diagnosis of  By: Clent Ridges NP, [name]     CAD (coronary artery disease), native coronary artery 02/06/2019   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   Cataract    Chronic cough    Chronic rhinitis 05/02/2009   Followed  in Pulmonary clinic/ [facility]/ [name]    - 03/24/12 CT Sinus > Negative paranasal sinuses - Sinus CT 08/07/2013 >>Short air-fluid levels in the maxillary sinuses bilaterally suggesting early acute sinusitis - Repeat augmentin x 21 days 03/27/2014 then sinus ct if not better  - flonase/afrin regimen 02/11/15 >>>      Coronary heart disease    COUGH, CHRONIC 11/24/2007   Followed in Pulmonary clinic/ [facility]/ [name]      DDD (degenerative disc disease), lumbar    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   Followed in [facility]monary clinic/  [facility]/ [name]  -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     Excessive ear wax, bilateral 09/15/2018   GASTROESOPHAGEAL REFLUX DISEASE 08/27/2008   Followed as Primary Care Patient/ GI/ Dr  Danise Edge     Glaucoma    Hyperlipidemia LDL goal <70 02/06/2019   Hypertension    Obesity    Osteoarthritis    Paralyzed hemidiaphragm    left, 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 male who was admitted 06/28/2023 with a diagnosis of Malignant neoplasm of overlapping sites of bladder [facility]) and went to the operating room on 06/28/2023 and underwent the above named procedures.  He had extensive tumor involving the prostatic urethra and the bulk of the bladder wall and had a partial resection.  There was some troublesome bleeding that took time to fulgurate and he had an EBL of at least .   Post op he had a 24hr hematuria cath placed and did require  some irrigations but he eventually cleared.,  His Hgb fell from 10.8 preop to 7.0 but came up to 8.2 with 1u PRBC's and remained stable thereafter.   He had a voiding trial on 7/15 but failed that and a foley was replaced.  He will be discharged with the foley.  I will arrange f/u for a second stage procedure in 2-3 weeks.  He will be kept off of Eliquis.   He was given perioperative antibiotics:  Anti-infectives (From admission, onward)    Start     Dose/Rate Route Frequency Ordered Stop   06/28/23 1500  ceFAZolin (ANCEF) IVPB 2g/100 mL premix        2 g 200 mL/hr over 30 Minutes Intravenous Every 8 hours 06/28/23 1238 07/05/23 1359   06/28/23 0539  ceFAZolin (ANCEF) IVPB 2g/100 mL premix        2 g 200 mL/hr over 30 Minutes Intravenous 30 min pre-op 06/28/23 0539 06/28/23 0803     .  He was given sequential compression devices for DVT prophylaxis.  He benefited maximally from the hospital stay and there were no complications.    Recent vital signs:  Vitals:   07/02/23 0517 07/02/23 1403  BP: 114/62 95/63  Pulse: 79 78  Resp: 16 17  Temp: 98.1 F (36.7 C) 98.6 F (37 C)  SpO2: 93% 91%    Recent laboratory studies:  Lab Results  Component Value Date   HGB 8.2 (L) 07/02/2023   HGB 8.2 (L) 06/29/2023   HGB 7.0 (L) 06/28/2023   Lab Results  Component Value Date   WBC 6.2 07/02/2023   PLT 167 07/02/2023   Lab Results  Component Value Date   INR 1.0 06/03/2023   Lab Results  Component Value Date   NA 132 (L) 07/02/2023   K 4.1 07/02/2023   CL 101 07/02/2023   CO2 25 07/02/2023   BUN 18 07/02/2023   CREATININE 0.71 07/02/2023   GLUCOSE 118 (H) 07/02/2023    Discharge Medications:   Allergies as of 07/02/2023       Reactions   Piroxicam Itching, Rash        Medication List     STOP taking these medications    apixaban 5 MG Tabs tablet Commonly known as: ELIQUIS       TAKE these medications    acetaminophen 325 MG tablet Commonly known as:  TYLENOL Take 2 tablets (650 mg total) by mouth every 4 (four) hours as needed for mild pain (or Fever >/= 101).   atorvastatin 40 MG tablet Commonly known as: LIPITOR TAKE 1 TABLET BY MOUTH EVERY DAY   budesonide-formoterol 160-4.5 MCG/ACT inhaler Commonly known as: Symbicort TAKE 2 PUFFS BY MOUTH TWICE A DAY What changed:  how much to take how to take this when to take this additional instructions  carboxymethylcellulose 0.5 % Soln Commonly known as: REFRESH PLUS Use as needed for dry eye   CENTRUM SILVER PO Take 1 tablet by mouth daily.   cholestyramine 4 g packet Commonly known as: Questran Take 1 packet by mouth 2 (two) times daily.   cycloSPORINE 0.05 % ophthalmic emulsion Commonly known as: RESTASIS Place 1 drop into both eyes 2 (two) times daily.   diltiazem 240 MG 24 hr capsule Commonly known as: CARDIZEM CD TAKE 1 CAPSULE BY MOUTH EVERY DAY What changed: how much to take   escitalopram 20 MG tablet Commonly known as: LEXAPRO Take 1 tablet (20 mg total) by mouth daily.   ezetimibe 10 MG tablet Commonly known as: ZETIA TAKE 1 TABLET BY MOUTH EVERY DAY   latanoprost 0.005 % ophthalmic solution Commonly known as: XALATAN Place 1 drop into both eyes at bedtime.   losartan 25 MG tablet Commonly known as: COZAAR Take 25 mg by mouth daily.   metoprolol tartrate 50 MG tablet Commonly known as: LOPRESSOR TAKE 1 TABLET BY MOUTH TWICE A DAY   montelukast 10 MG tablet Commonly known as: SINGULAIR TAKE ONE TABLET BY MOUTH AT BEDTIME EVERY NIGHT What changed:  how much to take how to take this when to take this additional instructions   nitroGLYCERIN 0.4 MG SL tablet Commonly known as: NITROSTAT Place 1 tablet (0.4 mg total) under the tongue every 5 (five) minutes as needed for chest pain.   OcuSoft Eyelid Cleansing Pads Apply topically as needed. Use daily   pantoprazole 40 MG tablet Commonly known as: PROTONIX Take 1 tablet (40 mg total) by mouth  daily. Start taking on: July 03, 2023   QUEtiapine 25 MG tablet Commonly known as: SEROQUEL Take 1 tablet (25 mg total) by mouth at bedtime as needed (agitation).   Slow Fe 142 (45 Fe) MG Tbcr Generic drug: Ferrous Sulfate 1 tablet Orally Once a day   sulfaSALAzine 500 MG tablet Commonly known as: AZULFIDINE Take 1,000 mg by mouth 4 (four) times daily.   Ventolin HFA 108 (90 Base) MCG/ACT inhaler Generic drug: albuterol INHALE 2 PUFFS INTO THE LUNGS EVERY 4 HRS AS NEEDED FOR WHEEZING OR SHORTNESS OF BREATH What changed: See the new instructions.   Vitamin D 50 MCG (2000 UT) tablet Take 2,000 Units by mouth daily.        Diagnostic Studies: DG Knee 3 Views Left  Result Date: 06/04/2023 CLINICAL DATA:  Left knee pain, no known injury, initial encounter EXAM: LEFT KNEE - 3 VIEW COMPARISON:  06/25/2022 FINDINGS: Extensive degenerative changes are noted worst in the lateral joint space moderate joint effusion is seen. The overall appearance is similar to that noted on the prior exam. Postsurgical changes along the medial calf are seen. IMPRESSION: Degenerative changes stable from the prior exam. Electronically Signed   By: Alcide Clever M.D.   On: 06/04/2023 19:45   CT ABDOMEN PELVIS W CONTRAST  Result Date: 06/03/2023 CLINICAL DATA:  Pain history of ulcerative colitis. EXAM: CT ABDOMEN AND PELVIS WITH CONTRAST TECHNIQUE: Multidetector CT imaging of the abdomen and pelvis was performed using the standard protocol following bolus administration of intravenous contrast. RADIATION DOSE REDUCTION: This exam was performed according to the departmental dose-optimization program which includes automated exposure control, adjustment of the mA and/or kV according to patient size and/or use of iterative reconstruction technique. CONTRAST:  OMNIPAQUE IOHEXOL 300 MG/ML  SOLN COMPARISON:  None Available. FINDINGS: Lower chest: Breathing motion at the lung bases. Patient is status post  median  sternotomy. Coronary artery calcifications are seen. Elevated left hemidiaphragm. Calcified nodule left lower lobe series 5, image 28 consistent with old granulomatous disease. Hepatobiliary: No space-occupying liver lesion. Patent portal vein. Gallbladder is nondilated. Pancreas: Unremarkable. No pancreatic ductal dilatation or surrounding inflammatory changes. Spleen: Normal in size without focal abnormality. Adrenals/Urinary Tract: Slight nonspecific nodularity of the adrenal glands. The left kidney is absent. The remnant distal left ureter is mildly dilated. Nonobstructing right-sided renal stone along the renal pelvis measuring 8 mm on series 3, image 40. No separate enhancing right-sided renal mass or right-sided renal collecting system dilatation. The bladder however has multiple mass lesions along the lumen diffusely distributed. Please correlate for any known history including for potential bladder cancer. Area of dystrophic calcification as well along the posterior aspect of the the bladder on the right side near the margin of the UVJ. Stomach/Bowel: No oral contrast. Stomach is nondilated. Small bowel is nondilated. There is a large left inguinal hernia involving a significant portion of the sigmoid colon. This a wide mouth hernia. No signs of obstruction. Few areas of colonic diverticula. No specific colonic wall thickening, inflammatory stranding. The appendix is poorly seen in the right lower quadrant but no pericecal stranding or fluid. Vascular/Lymphatic: Aortic atherosclerosis. No enlarged abdominal or pelvic lymph nodes. Reproductive: Prostate is unremarkable. Other: Small fat containing umbilical hernia Musculoskeletal: Curvature of the spine. Scattered degenerative changes of the spine and pelvis. IMPRESSION: Multiple mass lesions along the bladder lumen with some areas of dystrophic calcification near the margin of the right UVJ. Please correlate for any known history. Neoplasm is possible  recommend further workup when appropriate otherwise. Absence of the left kidney. Nonobstructing right-sided renal stone. No bowel obstruction, free air or free fluid. Large left inguinal hernia involving sigmoid colon. Electronically Signed   By: Karen Kays M.D.   On: 06/03/2023 11:13    Disposition: Discharge disposition: 03-Skilled Nursing Facility       Discharge Instructions     Discontinue IV   Complete by: As directed         Follow-up Information     Bjorn Pippin, MD Follow up on 07/08/2023.   Specialty: Urology Why: 4pm    This appointment will be rescheduled. Contact information: [address] [place] [address] [phone]                  Signed: Bjorn Pippin 07/02/2023, 2:18 PM

## 2023-07-02 NOTE — Progress Notes (Signed)
Report called to [facility], regarding patient discharge and return to [facility]

## 2023-07-02 NOTE — Discharge Instructions (Signed)
If the catheter stops draining, please irrigate with 30-37ml of NS or sterile water using an asepto or plunger syringe and repeat as needed.

## 2023-07-02 NOTE — Progress Notes (Signed)
   4 Days Post-Op Subjective: 7/15: NAEON. Pt resting on rounds with no specific urological complaints.   07/02/23: Patient without complaints.  Bladder non-palpable and non-tender.  Foley draining minimally pink urine.   Objective: Vital signs in last 24 hours: Temp:  [97.8 F (36.6 C)-98.4 F (36.9 C)] 98.1 F (36.7 C) (07/16 0517) Pulse Rate:  [75-102] 79 (07/16 0517) Resp:  [16-18] 16 (07/16 0517) BP: (104-118)/(61-64) 114/62 (07/16 0517) SpO2:  [89 %-93 %] 93 % (07/16 0517)  Intake/Output from previous day: 07/15 0701 - 07/16 0700 In: 189.3 [IV Piggyback:189.3] Out: 882 [Urine:882]  Intake/Output this shift: Total I/O In: 240 [P.O.:240] Out: 300 [Urine:300]  Physical Exam:  General: Alert and oriented CV: No cyanosis Lungs: equal chest rise Abdomen: Soft, NTND, no rebound or guarding Gu: Foley cath in place draining clear slightly pink  urine with some old blood present in tubing.   Lab Results: Recent Labs    07/02/23 0524  HGB 8.2*  HCT 26.6*   BMET Recent Labs    07/01/23 0525 07/02/23 0524  NA 132* 132*  K 4.0 4.1  CL 101 101  CO2 24 25  GLUCOSE 115* 118*  BUN 15 18  CREATININE 0.63 0.71  CALCIUM 8.3* 8.3*     Studies/Results: No results found.  Assessment/Plan: #Hematuria #Solitary right kidney # Nephrolithiasis #Bladder Cancer #S/p TURBT  Extensive but incomplete resection of tumor covering prostatic urethra and bladder. Patient has follow-up scheduled for 7/22 but will be unlikely to be able to come to that appointment..  I discussed scheduling next surgery for 2-3 weeks out.. Patient would be high risk for extensive bleeding if he were to resume Eliquis at this time.  Will recommend long term hold.  Hospitalist agrees.  Foley catheter back in place after failed voiding trial and will keep for the time being.  Will plan for discharge to nursing facility after he is seen by Dr. Mahala Menghini and he is agreeable.   LOS: 3 days   Bjorn Pippin MD.   07/02/2023, 1:27 PM Patient ID: Barry [name], male   DOB: [birth year], 77 y.o.   MRN: [id number]

## 2023-07-02 NOTE — Final Consult Note (Signed)
  Final consult  6 black male permanent A-fib/Eliquis CHADVASC >4 CAD-CABG 2001--orthostatic hypotension--- stable echo 12/2022 no ischemia nuclear test 2023 Ulcerative colitis-last colonoscopy 2021 diverticulosis benign polyps (Dr. Dulce Sellar) Known hemidiaphragm paralysis, L side Recent diagnosis multifocal bladder lesions-urothelial CA TURBT Class III obesity Known solitary kidney on the right side after left was removed for atrophy   Hospitalized 6/17-6/24 complicated UTI bladder masses and followed up with urology  At that admission had some diarrhea and was placed on Questran  Returned 06/28/2023 for TURBT of urothelial CA largest close to 5 cm-postop developed hematuria A-fib RVR-was placed initially on IV Cardizem IV amiodarone bolus and IV fluids Foley was removed by primary service apixaban was held patient has been covered with antibiotics per primary team hemoglobin has stabilized in the 8 range  S Looks well seen at the bedside coherent no distress eating and drinking HCPOA with several questions which I answered Nursing confirms only 1 stool today which was soft-patient carries a diagnosis of ulcerative colitis and is on sulfasalazine--also is on several purgative laxatives but was discharged on?  Questran last admission?  As per PCP orders  O BP 114/62 (BP Location: Left Arm)   Pulse 79   Temp 98.1 F (36.7 C) (Oral)   Resp 16   Ht 5\' 3"  (1.6 m)   Wt 91.2 kg   SpO2 93%   BMI 35.62 kg/m  Sinus on exam abdomen is slightly tender in left upper quadrant left lower quadrant no rebound no guarding Chest is clear Trace lower extremity edema  P   A-fib RVR with hematuria  secure chatted Dr. Raynelle Jan as well as Ms Dennie Bible trent and we will try to have the patient reevaluated by cardiology soon in the outpatient setting to discuss risk benefits and alternatives to anticoagulation Had been replaced previously on the 13th on Cardizem to 40 and Lopressor 50 twice daily Can  also continue losartan but if needed discontinue this if you need enough blood pressure for rate control  He is having some diarrhea-I have reconciled the [facility] and discontinued all laxatives and patient should continue the Questran--I would defer further antibiotics as well as pain meds to primary service, he also will need a Foley at discharge  Given his hemoglobin is stable over the past several days above 8 and he is not having active bleeding (his Foley does have dark urine but it does not seem to have clots I think he is stabilized from my perspective for discharge It looks like he is being coordinated for skilled facility placement and I have included the case manager involved in the case to help plan with his HCPOA who had discussion with  We will sign off thank you for the consult  Pleas Koch, MD [facility] 1:54 PM

## 2023-07-03 DIAGNOSIS — I480 Paroxysmal atrial fibrillation: Secondary | ICD-10-CM | POA: Diagnosis not present

## 2023-07-03 DIAGNOSIS — R339 Retention of urine, unspecified: Secondary | ICD-10-CM | POA: Diagnosis not present

## 2023-07-03 DIAGNOSIS — C678 Malignant neoplasm of overlapping sites of bladder: Secondary | ICD-10-CM | POA: Diagnosis not present

## 2023-07-03 DIAGNOSIS — D649 Anemia, unspecified: Secondary | ICD-10-CM | POA: Diagnosis not present

## 2023-07-04 DIAGNOSIS — I4891 Unspecified atrial fibrillation: Secondary | ICD-10-CM | POA: Diagnosis not present

## 2023-07-04 DIAGNOSIS — R2681 Unsteadiness on feet: Secondary | ICD-10-CM | POA: Diagnosis not present

## 2023-07-04 DIAGNOSIS — N3289 Other specified disorders of bladder: Secondary | ICD-10-CM | POA: Diagnosis not present

## 2023-07-04 DIAGNOSIS — M6281 Muscle weakness (generalized): Secondary | ICD-10-CM | POA: Diagnosis not present

## 2023-07-04 DIAGNOSIS — J45909 Unspecified asthma, uncomplicated: Secondary | ICD-10-CM | POA: Diagnosis not present

## 2023-07-04 DIAGNOSIS — Z483 Aftercare following surgery for neoplasm: Secondary | ICD-10-CM | POA: Diagnosis not present

## 2023-07-08 ENCOUNTER — Encounter (INDEPENDENT_AMBULATORY_CARE_PROVIDER_SITE_OTHER): Payer: Medicare Other | Admitting: Ophthalmology

## 2023-07-08 DIAGNOSIS — N3289 Other specified disorders of bladder: Secondary | ICD-10-CM | POA: Diagnosis not present

## 2023-07-08 DIAGNOSIS — I4891 Unspecified atrial fibrillation: Secondary | ICD-10-CM | POA: Diagnosis not present

## 2023-07-08 DIAGNOSIS — R2681 Unsteadiness on feet: Secondary | ICD-10-CM | POA: Diagnosis not present

## 2023-07-08 DIAGNOSIS — Z483 Aftercare following surgery for neoplasm: Secondary | ICD-10-CM | POA: Diagnosis not present

## 2023-07-08 DIAGNOSIS — M6281 Muscle weakness (generalized): Secondary | ICD-10-CM | POA: Diagnosis not present

## 2023-07-08 DIAGNOSIS — J45909 Unspecified asthma, uncomplicated: Secondary | ICD-10-CM | POA: Diagnosis not present

## 2023-07-09 ENCOUNTER — Other Ambulatory Visit: Payer: Self-pay | Admitting: Urology

## 2023-07-09 DIAGNOSIS — R310 Gross hematuria: Secondary | ICD-10-CM | POA: Diagnosis not present

## 2023-07-09 DIAGNOSIS — K922 Gastrointestinal hemorrhage, unspecified: Secondary | ICD-10-CM | POA: Diagnosis not present

## 2023-07-09 DIAGNOSIS — I480 Paroxysmal atrial fibrillation: Secondary | ICD-10-CM | POA: Diagnosis not present

## 2023-07-09 DIAGNOSIS — N3289 Other specified disorders of bladder: Secondary | ICD-10-CM | POA: Diagnosis not present

## 2023-07-10 ENCOUNTER — Ambulatory Visit (HOSPITAL_COMMUNITY): Payer: Medicare Other | Admitting: Physician Assistant

## 2023-07-11 DIAGNOSIS — R2681 Unsteadiness on feet: Secondary | ICD-10-CM | POA: Diagnosis not present

## 2023-07-11 DIAGNOSIS — I4891 Unspecified atrial fibrillation: Secondary | ICD-10-CM | POA: Diagnosis not present

## 2023-07-11 DIAGNOSIS — Z483 Aftercare following surgery for neoplasm: Secondary | ICD-10-CM | POA: Diagnosis not present

## 2023-07-11 DIAGNOSIS — N3289 Other specified disorders of bladder: Secondary | ICD-10-CM | POA: Diagnosis not present

## 2023-07-11 DIAGNOSIS — M6281 Muscle weakness (generalized): Secondary | ICD-10-CM | POA: Diagnosis not present

## 2023-07-11 DIAGNOSIS — J45909 Unspecified asthma, uncomplicated: Secondary | ICD-10-CM | POA: Diagnosis not present

## 2023-07-15 DIAGNOSIS — J45909 Unspecified asthma, uncomplicated: Secondary | ICD-10-CM | POA: Diagnosis not present

## 2023-07-15 DIAGNOSIS — C678 Malignant neoplasm of overlapping sites of bladder: Secondary | ICD-10-CM | POA: Diagnosis not present

## 2023-07-15 DIAGNOSIS — R2681 Unsteadiness on feet: Secondary | ICD-10-CM | POA: Diagnosis not present

## 2023-07-15 DIAGNOSIS — M6281 Muscle weakness (generalized): Secondary | ICD-10-CM | POA: Diagnosis not present

## 2023-07-15 DIAGNOSIS — I10 Essential (primary) hypertension: Secondary | ICD-10-CM | POA: Diagnosis not present

## 2023-07-15 DIAGNOSIS — I480 Paroxysmal atrial fibrillation: Secondary | ICD-10-CM | POA: Diagnosis not present

## 2023-07-15 DIAGNOSIS — Z483 Aftercare following surgery for neoplasm: Secondary | ICD-10-CM | POA: Diagnosis not present

## 2023-07-15 DIAGNOSIS — I4891 Unspecified atrial fibrillation: Secondary | ICD-10-CM | POA: Diagnosis not present

## 2023-07-15 DIAGNOSIS — N3289 Other specified disorders of bladder: Secondary | ICD-10-CM | POA: Diagnosis not present

## 2023-07-18 DIAGNOSIS — C678 Malignant neoplasm of overlapping sites of bladder: Secondary | ICD-10-CM | POA: Diagnosis not present

## 2023-07-18 DIAGNOSIS — F062 Psychotic disorder with delusions due to known physiological condition: Secondary | ICD-10-CM | POA: Diagnosis not present

## 2023-07-18 DIAGNOSIS — J45909 Unspecified asthma, uncomplicated: Secondary | ICD-10-CM | POA: Diagnosis not present

## 2023-07-18 DIAGNOSIS — I10 Essential (primary) hypertension: Secondary | ICD-10-CM | POA: Diagnosis not present

## 2023-07-18 DIAGNOSIS — R2681 Unsteadiness on feet: Secondary | ICD-10-CM | POA: Diagnosis not present

## 2023-07-18 DIAGNOSIS — I4891 Unspecified atrial fibrillation: Secondary | ICD-10-CM | POA: Diagnosis not present

## 2023-07-18 DIAGNOSIS — N3289 Other specified disorders of bladder: Secondary | ICD-10-CM | POA: Diagnosis not present

## 2023-07-18 DIAGNOSIS — Z483 Aftercare following surgery for neoplasm: Secondary | ICD-10-CM | POA: Diagnosis not present

## 2023-07-18 DIAGNOSIS — M6281 Muscle weakness (generalized): Secondary | ICD-10-CM | POA: Diagnosis not present

## 2023-07-19 NOTE — Patient Instructions (Addendum)
Preop instructions for:     Barry [name] Date of Birth:  March 15, 1946                      Date of Procedure:   Tuesday, Aug. 6, 2024 Procedure:  TRANSURETHRAL RESECTION OF BLADDER TUMOR (TURBT)      Surgeon: Dr. Bjorn Pippin Facility contact: [name]    Phone:  [phone]               Health Care POA: RN contact name/phone#:                          and Fax #: [phone]   Transportation contact phone#: [name]) [phone]    Please send day of procedure:       Current med list  Medications taken the day of procedure (return attached form to hospital) confirm time of nothing by mouth status (return attached form to hospital) Patient Demographic info( to include DNR status, problem list, allergies) Bring Insurance card and picture ID     Time to arrive at [name]:  6:15 AM   Report to: Admitting (On your left hand side)    Do not eat solid food or drink past midnight the night before your procedure.(To include any tube feedings-must be discontinued)   Per office protocol, patient can hold Eliquis for 3 days prior to procedure.     Take these morning medications only with sips of water.(or give through gastrostomy or feeding tube).  Atorvastatin Diltiazem Escitalopram Ezetimibe Metoprolol Sulfasalazine Tramadol Pantoprazole Use Budesonide-Formoterol   Use Cyclosporine         Bring Rescue Inhaler                                                                                                                                                                    Note: No Insulin or Diabetic meds should be given or taken the morning of the procedure!   Oral Hygiene is also important to reduce your risk of infection.                                    Remember - BRUSH YOUR TEETH THE MORNING OF SURGERY WITH YOUR REGULAR TOOTHPASTE   DENTURES WILL BE REMOVED PRIOR TO SURGERY PLEASE DO NOT APPLY "Poly grip" OR  ADHESIVES!!!   Leave all jewelry and other valuables at place where living( no metal or rings to be worn) No contact lens   Men-no colognes,lotions   Any questions day of procedure,call  SHORT STAY-[phone]     Sent  from :[facility]                   [phone]                   Fax:[phone]   Sent by :  [name] BSN,  RN

## 2023-07-21 ENCOUNTER — Emergency Department (HOSPITAL_COMMUNITY): Payer: Medicare Other

## 2023-07-21 ENCOUNTER — Other Ambulatory Visit: Payer: Self-pay

## 2023-07-21 ENCOUNTER — Encounter (HOSPITAL_COMMUNITY): Payer: Self-pay

## 2023-07-21 ENCOUNTER — Inpatient Hospital Stay (HOSPITAL_COMMUNITY)
Admission: EM | Admit: 2023-07-21 | Discharge: 2023-08-03 | DRG: 662 | Disposition: A | Discharge status: Skilled Nursing Facility | Payer: Medicare Other | Source: Skilled Nursing Facility | Attending: Internal Medicine | Admitting: Internal Medicine

## 2023-07-21 DIAGNOSIS — R627 Adult failure to thrive: Secondary | ICD-10-CM | POA: Diagnosis present

## 2023-07-21 DIAGNOSIS — Z8249 Family history of ischemic heart disease and other diseases of the circulatory system: Secondary | ICD-10-CM

## 2023-07-21 DIAGNOSIS — I10 Essential (primary) hypertension: Secondary | ICD-10-CM | POA: Diagnosis not present

## 2023-07-21 DIAGNOSIS — E8721 Acute metabolic acidosis: Secondary | ICD-10-CM | POA: Diagnosis present

## 2023-07-21 DIAGNOSIS — E876 Hypokalemia: Secondary | ICD-10-CM | POA: Diagnosis present

## 2023-07-21 DIAGNOSIS — D849 Immunodeficiency, unspecified: Secondary | ICD-10-CM | POA: Diagnosis not present

## 2023-07-21 DIAGNOSIS — Z7901 Long term (current) use of anticoagulants: Secondary | ICD-10-CM | POA: Diagnosis not present

## 2023-07-21 DIAGNOSIS — D62 Acute posthemorrhagic anemia: Secondary | ICD-10-CM | POA: Diagnosis not present

## 2023-07-21 DIAGNOSIS — E872 Acidosis, unspecified: Secondary | ICD-10-CM | POA: Diagnosis present

## 2023-07-21 DIAGNOSIS — G9341 Metabolic encephalopathy: Secondary | ICD-10-CM | POA: Diagnosis present

## 2023-07-21 DIAGNOSIS — E669 Obesity, unspecified: Secondary | ICD-10-CM | POA: Diagnosis not present

## 2023-07-21 DIAGNOSIS — Z7189 Other specified counseling: Secondary | ICD-10-CM | POA: Diagnosis not present

## 2023-07-21 DIAGNOSIS — C679 Malignant neoplasm of bladder, unspecified: Secondary | ICD-10-CM | POA: Diagnosis present

## 2023-07-21 DIAGNOSIS — Z7951 Long term (current) use of inhaled steroids: Secondary | ICD-10-CM

## 2023-07-21 DIAGNOSIS — M4692 Unspecified inflammatory spondylopathy, cervical region: Secondary | ICD-10-CM | POA: Diagnosis not present

## 2023-07-21 DIAGNOSIS — I480 Paroxysmal atrial fibrillation: Secondary | ICD-10-CM | POA: Diagnosis present

## 2023-07-21 DIAGNOSIS — E860 Dehydration: Secondary | ICD-10-CM | POA: Diagnosis present

## 2023-07-21 DIAGNOSIS — Z888 Allergy status to other drugs, medicaments and biological substances status: Secondary | ICD-10-CM

## 2023-07-21 DIAGNOSIS — Z79899 Other long term (current) drug therapy: Secondary | ICD-10-CM

## 2023-07-21 DIAGNOSIS — Z66 Do not resuscitate: Secondary | ICD-10-CM | POA: Diagnosis present

## 2023-07-21 DIAGNOSIS — E871 Hypo-osmolality and hyponatremia: Secondary | ICD-10-CM | POA: Diagnosis present

## 2023-07-21 DIAGNOSIS — J45909 Unspecified asthma, uncomplicated: Secondary | ICD-10-CM | POA: Diagnosis present

## 2023-07-21 DIAGNOSIS — D509 Iron deficiency anemia, unspecified: Secondary | ICD-10-CM | POA: Diagnosis present

## 2023-07-21 DIAGNOSIS — T83511A Infection and inflammatory reaction due to indwelling urethral catheter, initial encounter: Secondary | ICD-10-CM | POA: Diagnosis not present

## 2023-07-21 DIAGNOSIS — J9811 Atelectasis: Secondary | ICD-10-CM | POA: Diagnosis not present

## 2023-07-21 DIAGNOSIS — R000 Tachycardia, unspecified: Secondary | ICD-10-CM | POA: Diagnosis present

## 2023-07-21 DIAGNOSIS — C678 Malignant neoplasm of overlapping sites of bladder: Secondary | ICD-10-CM | POA: Diagnosis not present

## 2023-07-21 DIAGNOSIS — N139 Obstructive and reflux uropathy, unspecified: Secondary | ICD-10-CM | POA: Diagnosis not present

## 2023-07-21 DIAGNOSIS — R1084 Generalized abdominal pain: Secondary | ICD-10-CM | POA: Diagnosis not present

## 2023-07-21 DIAGNOSIS — Z8673 Personal history of transient ischemic attack (TIA), and cerebral infarction without residual deficits: Secondary | ICD-10-CM

## 2023-07-21 DIAGNOSIS — C689 Malignant neoplasm of urinary organ, unspecified: Secondary | ICD-10-CM | POA: Diagnosis not present

## 2023-07-21 DIAGNOSIS — N390 Urinary tract infection, site not specified: Secondary | ICD-10-CM | POA: Diagnosis not present

## 2023-07-21 DIAGNOSIS — Z7401 Bed confinement status: Secondary | ICD-10-CM | POA: Diagnosis not present

## 2023-07-21 DIAGNOSIS — B9620 Unspecified Escherichia coli [E. coli] as the cause of diseases classified elsewhere: Secondary | ICD-10-CM | POA: Diagnosis present

## 2023-07-21 DIAGNOSIS — K5190 Ulcerative colitis, unspecified, without complications: Secondary | ICD-10-CM | POA: Diagnosis present

## 2023-07-21 DIAGNOSIS — I2510 Atherosclerotic heart disease of native coronary artery without angina pectoris: Secondary | ICD-10-CM | POA: Diagnosis present

## 2023-07-21 DIAGNOSIS — R2681 Unsteadiness on feet: Secondary | ICD-10-CM | POA: Diagnosis not present

## 2023-07-21 DIAGNOSIS — R310 Gross hematuria: Secondary | ICD-10-CM | POA: Diagnosis not present

## 2023-07-21 DIAGNOSIS — D640 Hereditary sideroblastic anemia: Secondary | ICD-10-CM | POA: Diagnosis not present

## 2023-07-21 DIAGNOSIS — R54 Age-related physical debility: Secondary | ICD-10-CM | POA: Diagnosis not present

## 2023-07-21 DIAGNOSIS — J984 Other disorders of lung: Secondary | ICD-10-CM | POA: Diagnosis not present

## 2023-07-21 DIAGNOSIS — T83511D Infection and inflammatory reaction due to indwelling urethral catheter, subsequent encounter: Secondary | ICD-10-CM | POA: Diagnosis not present

## 2023-07-21 DIAGNOSIS — J952 Acute pulmonary insufficiency following nonthoracic surgery: Secondary | ICD-10-CM | POA: Diagnosis not present

## 2023-07-21 DIAGNOSIS — R0989 Other specified symptoms and signs involving the circulatory and respiratory systems: Secondary | ICD-10-CM | POA: Diagnosis not present

## 2023-07-21 DIAGNOSIS — Z1152 Encounter for screening for COVID-19: Secondary | ICD-10-CM | POA: Diagnosis not present

## 2023-07-21 DIAGNOSIS — Z905 Acquired absence of kidney: Secondary | ICD-10-CM

## 2023-07-21 DIAGNOSIS — D494 Neoplasm of unspecified behavior of bladder: Secondary | ICD-10-CM | POA: Diagnosis not present

## 2023-07-21 DIAGNOSIS — Z209 Contact with and (suspected) exposure to unspecified communicable disease: Secondary | ICD-10-CM | POA: Diagnosis not present

## 2023-07-21 DIAGNOSIS — E785 Hyperlipidemia, unspecified: Secondary | ICD-10-CM | POA: Diagnosis present

## 2023-07-21 DIAGNOSIS — I6782 Cerebral ischemia: Secondary | ICD-10-CM | POA: Diagnosis not present

## 2023-07-21 DIAGNOSIS — R5381 Other malaise: Secondary | ICD-10-CM | POA: Diagnosis not present

## 2023-07-21 DIAGNOSIS — M6281 Muscle weakness (generalized): Secondary | ICD-10-CM | POA: Diagnosis not present

## 2023-07-21 DIAGNOSIS — Z515 Encounter for palliative care: Secondary | ICD-10-CM

## 2023-07-21 DIAGNOSIS — H409 Unspecified glaucoma: Secondary | ICD-10-CM | POA: Diagnosis present

## 2023-07-21 DIAGNOSIS — Z6833 Body mass index (BMI) 33.0-33.9, adult: Secondary | ICD-10-CM

## 2023-07-21 DIAGNOSIS — R41841 Cognitive communication deficit: Secondary | ICD-10-CM | POA: Diagnosis not present

## 2023-07-21 DIAGNOSIS — Y846 Urinary catheterization as the cause of abnormal reaction of the patient, or of later complication, without mention of misadventure at the time of the procedure: Secondary | ICD-10-CM | POA: Diagnosis present

## 2023-07-21 DIAGNOSIS — N200 Calculus of kidney: Secondary | ICD-10-CM | POA: Diagnosis present

## 2023-07-21 DIAGNOSIS — R4182 Altered mental status, unspecified: Secondary | ICD-10-CM | POA: Diagnosis not present

## 2023-07-21 DIAGNOSIS — D539 Nutritional anemia, unspecified: Secondary | ICD-10-CM | POA: Diagnosis present

## 2023-07-21 DIAGNOSIS — R0689 Other abnormalities of breathing: Secondary | ICD-10-CM | POA: Diagnosis not present

## 2023-07-21 DIAGNOSIS — K529 Noninfective gastroenteritis and colitis, unspecified: Secondary | ICD-10-CM | POA: Diagnosis not present

## 2023-07-21 DIAGNOSIS — R262 Difficulty in walking, not elsewhere classified: Secondary | ICD-10-CM | POA: Diagnosis not present

## 2023-07-21 DIAGNOSIS — Z951 Presence of aortocoronary bypass graft: Secondary | ICD-10-CM

## 2023-07-21 DIAGNOSIS — R1310 Dysphagia, unspecified: Secondary | ICD-10-CM | POA: Diagnosis not present

## 2023-07-21 DIAGNOSIS — K219 Gastro-esophageal reflux disease without esophagitis: Secondary | ICD-10-CM | POA: Diagnosis present

## 2023-07-21 DIAGNOSIS — I499 Cardiac arrhythmia, unspecified: Secondary | ICD-10-CM | POA: Diagnosis not present

## 2023-07-21 LAB — URINALYSIS, W/ REFLEX TO CULTURE (INFECTION SUSPECTED)
Bilirubin Urine: NEGATIVE
Glucose, UA: NEGATIVE mg/dL
Ketones, ur: NEGATIVE mg/dL
Nitrite: NEGATIVE
Protein, ur: 100 mg/dL — AB
RBC / HPF: >50 RBC/hpf (ref 0–5)
Specific Gravity, Urine: 1.017 (ref 1.005–1.030)
WBC, UA: >50 WBC/hpf (ref 0–5)
pH: 5 (ref 5.0–8.0)

## 2023-07-21 LAB — PROTIME-INR
INR: 1.1 (ref 0.8–1.2)
Prothrombin Time: 14.2 seconds (ref 11.4–15.2)

## 2023-07-21 LAB — RESP PANEL BY RT-PCR (RSV, FLU A&B, COVID)  RVPGX2
Influenza A by PCR: NEGATIVE
Influenza B by PCR: NEGATIVE
Resp Syncytial Virus by PCR: NEGATIVE
SARS Coronavirus 2 by RT PCR: NEGATIVE

## 2023-07-21 LAB — COMPREHENSIVE METABOLIC PANEL
ALT: 30 U/L (ref 0–44)
AST: 38 U/L (ref 15–41)
Albumin: 2.6 g/dL — ABNORMAL LOW (ref 3.5–5.0)
Alkaline Phosphatase: 105 U/L (ref 38–126)
Anion gap: 12 (ref 5–15)
BUN: 19 mg/dL (ref 8–23)
CO2: 18 mmol/L — ABNORMAL LOW (ref 22–32)
Calcium: 9.1 mg/dL (ref 8.9–10.3)
Chloride: 101 mmol/L (ref 98–111)
Creatinine, Ser: 0.81 mg/dL (ref 0.61–1.24)
GFR, Estimated: >60 mL/min (ref >60)
Glucose, Bld: 112 mg/dL — ABNORMAL HIGH (ref 70–99)
Potassium: 3.9 mmol/L (ref 3.5–5.1)
Sodium: 131 mmol/L — ABNORMAL LOW (ref 135–145)
Total Bilirubin: 0.5 mg/dL (ref 0.3–1.2)
Total Protein: 6.5 g/dL (ref 6.5–8.1)

## 2023-07-21 LAB — CBC WITH DIFFERENTIAL/PLATELET
Abs Immature Granulocytes: 0.12 10*3/uL — ABNORMAL HIGH (ref 0.00–0.07)
Basophils Absolute: 0.1 10*3/uL (ref 0.0–0.1)
Basophils Relative: 1 %
Eosinophils Absolute: 0.1 10*3/uL (ref 0.0–0.5)
Eosinophils Relative: 1 %
HCT: 32.3 % — ABNORMAL LOW (ref 39.0–52.0)
Hemoglobin: 9.6 g/dL — ABNORMAL LOW (ref 13.0–17.0)
Immature Granulocytes: 1 %
Lymphocytes Relative: 13 %
Lymphs Abs: 1.2 10*3/uL (ref 0.7–4.0)
MCH: 27.4 pg (ref 26.0–34.0)
MCHC: 29.7 g/dL — ABNORMAL LOW (ref 30.0–36.0)
MCV: 92.3 fL (ref 80.0–100.0)
Monocytes Absolute: 0.9 10*3/uL (ref 0.1–1.0)
Monocytes Relative: 10 %
Neutro Abs: 6.7 10*3/uL (ref 1.7–7.7)
Neutrophils Relative %: 74 %
Platelets: 443 10*3/uL — ABNORMAL HIGH (ref 150–400)
RBC: 3.5 MIL/uL — ABNORMAL LOW (ref 4.22–5.81)
RDW: 19.4 % — ABNORMAL HIGH (ref 11.5–15.5)
WBC: 9.1 10*3/uL (ref 4.0–10.5)
nRBC: 0.3 % — ABNORMAL HIGH (ref 0.0–0.2)

## 2023-07-21 LAB — I-STAT CG4 LACTIC ACID, ED
Lactic Acid, Venous: 1.9 mmol/L (ref 0.5–1.9)
Lactic Acid, Venous: 1.5 mmol/L (ref 0.5–1.9)

## 2023-07-21 LAB — APTT: aPTT: 26 seconds (ref 24–36)

## 2023-07-21 MED ORDER — LATANOPROST 0.005 % OP SOLN
1.0000 [drp] | Freq: Every day | OPHTHALMIC | Status: DC
  Administered 2023-07-21 – 2023-08-02 (×13): 1 [drp] via OPHTHALMIC
  Filled 2023-07-21: qty 2.5

## 2023-07-21 MED ORDER — FERROUS SULFATE 325 (65 FE) MG PO TABS
325.0000 mg | ORAL_TABLET | Freq: Every day | ORAL | Status: DC
  Administered 2023-07-22 – 2023-08-03 (×12): 325 mg via ORAL
  Filled 2023-07-21 (×12): qty 1

## 2023-07-21 MED ORDER — LACTATED RINGERS IV BOLUS (SEPSIS)
1000.0000 mL | Freq: Once | INTRAVENOUS | Status: AC
  Administered 2023-07-21: 1000 mL via INTRAVENOUS

## 2023-07-21 MED ORDER — DILTIAZEM HCL ER COATED BEADS 240 MG PO CP24
240.0000 mg | ORAL_CAPSULE | Freq: Every day | ORAL | Status: DC
  Administered 2023-07-21 – 2023-07-24 (×4): 240 mg via ORAL
  Filled 2023-07-21 (×4): qty 1

## 2023-07-21 MED ORDER — MOMETASONE FURO-FORMOTEROL FUM 200-5 MCG/ACT IN AERO
2.0000 | INHALATION_SPRAY | Freq: Two times a day (BID) | RESPIRATORY_TRACT | Status: DC
  Administered 2023-07-21 – 2023-08-03 (×25): 2 via RESPIRATORY_TRACT
  Filled 2023-07-21: qty 8.8

## 2023-07-21 MED ORDER — PANTOPRAZOLE SODIUM 40 MG PO TBEC
40.0000 mg | DELAYED_RELEASE_TABLET | Freq: Every day | ORAL | Status: DC
  Administered 2023-07-21 – 2023-08-03 (×13): 40 mg via ORAL
  Filled 2023-07-21 (×13): qty 1

## 2023-07-21 MED ORDER — MONTELUKAST SODIUM 10 MG PO TABS
10.0000 mg | ORAL_TABLET | Freq: Every day | ORAL | Status: DC
  Administered 2023-07-21 – 2023-08-02 (×13): 10 mg via ORAL
  Filled 2023-07-21 (×13): qty 1

## 2023-07-21 MED ORDER — EZETIMIBE 10 MG PO TABS
10.0000 mg | ORAL_TABLET | Freq: Every day | ORAL | Status: DC
  Administered 2023-07-21 – 2023-08-03 (×13): 10 mg via ORAL
  Filled 2023-07-21 (×13): qty 1

## 2023-07-21 MED ORDER — SODIUM CHLORIDE 0.9 % IV SOLN
1.0000 g | INTRAVENOUS | Status: DC
  Administered 2023-07-22 – 2023-07-24 (×3): 1 g via INTRAVENOUS
  Filled 2023-07-21 (×4): qty 10

## 2023-07-21 MED ORDER — ALBUTEROL SULFATE (2.5 MG/3ML) 0.083% IN NEBU: 2.5000 mg | INHALATION_SOLUTION | RESPIRATORY_TRACT | Status: DC | PRN

## 2023-07-21 MED ORDER — GERHARDT'S BUTT CREAM
TOPICAL_CREAM | Freq: Three times a day (TID) | CUTANEOUS | Status: DC
  Administered 2023-07-21 – 2023-08-02 (×9): 1 via TOPICAL
  Filled 2023-07-21 (×3): qty 1

## 2023-07-21 MED ORDER — ACETAMINOPHEN 650 MG RE SUPP: 650.0000 mg | Freq: Four times a day (QID) | RECTAL | Status: DC | PRN

## 2023-07-21 MED ORDER — SODIUM CHLORIDE 0.9 % IV SOLN
2.0000 g | INTRAVENOUS | Status: DC
  Administered 2023-07-21: 2 g via INTRAVENOUS
  Filled 2023-07-21: qty 20

## 2023-07-21 MED ORDER — METOPROLOL TARTRATE 50 MG PO TABS
50.0000 mg | ORAL_TABLET | Freq: Two times a day (BID) | ORAL | Status: DC
  Administered 2023-07-21 – 2023-07-24 (×7): 50 mg via ORAL
  Filled 2023-07-21 (×2): qty 1
  Filled 2023-07-21: qty 2
  Filled 2023-07-21 (×3): qty 1

## 2023-07-21 MED ORDER — MUPIROCIN 2 % EX OINT
1.0000 | TOPICAL_OINTMENT | Freq: Two times a day (BID) | CUTANEOUS | Status: AC
  Administered 2023-07-21 – 2023-07-26 (×9): 1 via NASAL
  Filled 2023-07-21: qty 22

## 2023-07-21 MED ORDER — CHOLESTYRAMINE LIGHT 4 G PO PACK
4.0000 g | PACK | Freq: Two times a day (BID) | ORAL | Status: DC
  Administered 2023-07-21 – 2023-08-03 (×24): 4 g via ORAL
  Filled 2023-07-21 (×24): qty 1

## 2023-07-21 MED ORDER — ORAL CARE MOUTH RINSE: 15.0000 mL | OROMUCOSAL | Status: DC | PRN

## 2023-07-21 MED ORDER — LOSARTAN POTASSIUM 25 MG PO TABS
25.0000 mg | ORAL_TABLET | Freq: Every day | ORAL | Status: DC
  Administered 2023-07-21 – 2023-07-22 (×2): 25 mg via ORAL
  Filled 2023-07-21 (×3): qty 1

## 2023-07-21 MED ORDER — ENOXAPARIN SODIUM 40 MG/0.4ML IJ SOSY
40.0000 mg | PREFILLED_SYRINGE | INTRAMUSCULAR | Status: DC
  Administered 2023-07-21: 40 mg via SUBCUTANEOUS
  Filled 2023-07-21: qty 0.4

## 2023-07-21 MED ORDER — CYCLOSPORINE 0.05 % OP EMUL
1.0000 [drp] | Freq: Two times a day (BID) | OPHTHALMIC | Status: DC
  Administered 2023-07-21 – 2023-08-03 (×25): 1 [drp] via OPHTHALMIC
  Filled 2023-07-21 (×26): qty 30

## 2023-07-21 MED ORDER — LACTATED RINGERS IV SOLN: INTRAVENOUS | Status: AC

## 2023-07-21 MED ORDER — ACETAMINOPHEN 325 MG PO TABS: 650.0000 mg | ORAL_TABLET | Freq: Four times a day (QID) | ORAL | Status: DC | PRN

## 2023-07-21 MED ORDER — ESCITALOPRAM OXALATE 20 MG PO TABS
20.0000 mg | ORAL_TABLET | Freq: Every day | ORAL | Status: DC
  Administered 2023-07-21 – 2023-08-03 (×13): 20 mg via ORAL
  Filled 2023-07-21 (×13): qty 1

## 2023-07-21 MED ORDER — ATORVASTATIN CALCIUM 40 MG PO TABS
40.0000 mg | ORAL_TABLET | Freq: Every day | ORAL | Status: DC
  Administered 2023-07-21 – 2023-08-03 (×13): 40 mg via ORAL
  Filled 2023-07-21 (×13): qty 1

## 2023-07-21 MED ORDER — ENSURE ENLIVE PO LIQD
237.0000 mL | Freq: Two times a day (BID) | ORAL | Status: DC
  Administered 2023-07-22 – 2023-07-30 (×13): 237 mL via ORAL

## 2023-07-21 NOTE — H&P (Signed)
History and Physical  BRNADON EOFF QMV:[id number] DOB: 04-02-1946 DOA: 07/21/2023  PCP: Georgann Housekeeper, MD   Chief Complaint: Altered mental status  HPI: Barry [name] is a 77 y.o. male with medical history significant for atrial fibrillation on anticoagulation, chronic anemia, bladder cancer under the care of urology Dr. Annabell Howells being admitted to the hospital with altered mental status likely due to catheter associated UTI.  History is provided mainly by his friend and healthcare power of attorney Barry [name] who was at the bedside in the ER today.  She states that visiting him and his nursing home, she noticed over the last 3 to 4 days that he seems to be intermittently more more confused.  She is not aware of any fevers chills or nausea.  Currently the patient is alert and oriented to place and self, denies any discomfort.  He has had a chronic Foley catheter since the middle of July, when he was admitted by the urology service for TURBT.  States that he has had some blood in his urine.  Brought to the ER by EMS due to concern for the altered mental status.  ED Course: On evaluation in the emergency department, patient is tachycardic in atrial fibrillation.  Blood pressure stable 120/93, saturating well on room air.  Lab work was obtained, CBC demonstrates no leukocytosis, hemoglobin stable at 9.6 (actually improved from prior hospital stay), platelets 443, sodium 131, creatinine 0.81.  LFTs unremarkable.  Lactate 1.9.  He was given IV fluid bolus, empiric IV Rocephin after urinalysis shows evidence of possible UTI.  Review of Systems: Please see HPI for pertinent positives and negatives. A complete 10 system review of systems are otherwise negative.  Past Medical History:  Diagnosis Date   A-fib [facility])    Acute sinusitis 03/24/2012   Followed in Pulmonary clinic/ [facility]/ [name] - CT sinus 08/07/2013  Short air-fluid levels in the maxillary sinuses bilaterally suggesting early acute sinusitis.  - repeat ct sinus 04/30/2014 > ok     Acute sinusitis, unspecified 08/14/2010   Qualifier: Diagnosis of  By: Clent Ridges NP, [name]     Anemia    Asthma    PFTs 06/15/05 FEV1 85% predicted ratio 68% and truncation of resp loop in a sawtooth pattern. HFA 25% 08-01-2009 >50% Nov 01, 2009>50% 01-31-2010   Asthma, chronic 11/24/2007   Followed in Pulmonary clinic/ [facility]/ [name]  - PFTs 06/15/2005 FEV1 85% predicted ratio 68% and truncation of respiratory loop in a sawtooth pattern  - HFA 25% August 01, 2009 > 50% November 01, 2009 > 50% January 31, 2010 therefore changed to neb bud/brovana - HFA 75% p extensive coaching 08/07/2013  > insurance issues so try symbicort 160 2 bid instead of neb - 11/16/2015   p extensive   Bladder mass    BRONCHITIS, ACUTE 01/22/2011   Qualifier: Diagnosis of  By: Clent Ridges NP, [name]     CAD (coronary artery disease), native coronary artery 02/06/2019   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   Cataract    Chronic cough    Chronic rhinitis 05/02/2009   Followed in Pulmonary clinic/ [facility]/ [name]    - 03/24/12 CT Sinus > Negative paranasal sinuses - Sinus CT 08/07/2013 >>Short air-fluid levels in the maxillary sinuses bilaterally suggesting early acute sinusitis - Repeat augmentin x 21 days 03/27/2014 then sinus ct if not better  - flonase/afrin regimen 02/11/15 >>>      Coronary heart disease    COUGH, CHRONIC 11/24/2007  Followed in Pulmonary clinic/ [facility]/ [name]      DDD (degenerative disc disease), lumbar    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   Followed in Pulmonary clinic/ [facility]/ [name]  -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     Excessive ear wax, bilateral 09/15/2018   GASTROESOPHAGEAL REFLUX DISEASE 08/27/2008   Followed as Primary Care Patient/ GI/ Dr  Danise Edge     Glaucoma    Hyperlipidemia LDL goal <70 02/06/2019   Hypertension    Obesity    Osteoarthritis    Paralyzed hemidiaphragm     left, 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   Retina disorder 2014   Severe obesity (BMI >= 40) (HCC) 11/24/2007       - Target wt < 191 to get under BMI 30    Ulcerative colitis    ULCERATIVE COLITIS 11/24/2007   Followed as Primary Care Patient/ GI/ Dr  Danise Edge     URI 04/09/2008   Qualifier: Diagnosis of  By: Clent Ridges NP, [name]     Past Surgical History:  Procedure Laterality Date   CARDIAC CATHETERIZATION Left    COLONOSCOPY WITH PROPOFOL N/A 11/29/2015   Procedure: COLONOSCOPY WITH PROPOFOL;  Surgeon: Charolett Bumpers, MD;  Location: [facility] ENDOSCOPY;  Service: Endoscopy;  Laterality: N/A;   CORONARY ARTERY BYPASS GRAFT     x6   TRANSURETHRAL RESECTION OF BLADDER TUMOR N/A 06/28/2023   Procedure: CYSTOSCOPY TRANSURETHRAL RESECTION OF BLADDER TUMOR (TURBT);  Surgeon: Bjorn Pippin, MD;  Location: [facility] ORS;  Service: Urology;  Laterality: N/A;    Social History:  reports that he has never smoked. He has never used smokeless tobacco. He reports that he does not drink alcohol and does not use drugs.   Allergies  Allergen Reactions   Piroxicam Itching and Rash    Family History  Problem Relation Age of Onset   Heart disease Father    Hypertension Mother    Renal Disease Mother    Pulmonary Hypertension Mother    Other Sister        UNKNOWN HISTORY     Prior to Admission medications   Medication Sig Start Date End Date Taking? Authorizing Provider  acetaminophen (TYLENOL) 325 MG tablet Take 2 tablets (650 mg total) by mouth every 4 (four) hours as needed for mild pain (or Fever >/= 101). 06/10/23  Yes Lewie Chamber, MD  albuterol (VENTOLIN HFA) 108 (90 Base) MCG/ACT inhaler Inhale 2 puffs into the lungs every 4 (four) hours as needed for wheezing or shortness of breath.   Yes [provider]  atorvastatin (LIPITOR) 40 MG tablet Take 40 mg by mouth daily.   Yes [provider]  bisacodyl (DULCOLAX) 10 MG suppository Place 10 mg rectally daily as needed (Constipation not relieved  by milk of magnesium).   Yes [provider]  budesonide-formoterol (SYMBICORT) 160-4.5 MCG/ACT inhaler Inhale 2 puffs into the lungs 2 (two) times daily.   Yes [provider]  carboxymethylcellulose (REFRESH PLUS) 0.5 % SOLN Place 2 drops into both eyes every 8 (eight) hours as needed (dry eyes).   Yes [provider]  Cholecalciferol (VITAMIN D) 50 MCG (2000 UT) tablet Take 2,000 Units by mouth daily.    Yes [provider]  cholestyramine (QUESTRAN) 4 g packet Take 1 packet by mouth 2 (two) times daily. 07/02/23 07/01/24 Yes Rhetta Mura, MD  cycloSPORINE (RESTASIS) 0.05 % ophthalmic emulsion Place 1 drop into both eyes 2 (two) times daily.   Yes [provider]  diltiazem (  CARDIZEM CD) 240 MG 24 hr capsule Take 240 mg by mouth daily.   Yes [provider]  escitalopram (LEXAPRO) 20 MG tablet Take 1 tablet (20 mg total) by mouth daily. 07/02/23  Yes Rhetta Mura, MD  Eyelid Cleansers (OCUSOFT LID SCRUB EX) Apply 1 Application topically as needed (reason not listed on MAR).   Yes [provider]  ezetimibe (ZETIA) 10 MG tablet Take 10 mg by mouth daily.   Yes [provider]  Ferrous Sulfate (SLOW FE) 142 (45 Fe) MG TBCR Take 1 tablet by mouth daily.   Yes [provider]  latanoprost (XALATAN) 0.005 % ophthalmic solution Place 1 drop into both eyes at bedtime.   Yes [provider]  loperamide (IMODIUM) 2 MG capsule Take 4 mg by mouth in the morning and at bedtime.   Yes [provider]  losartan (COZAAR) 25 MG tablet Take 25 mg by mouth daily.   Yes [provider]  magnesium hydroxide (MILK OF MAGNESIA) 400 MG/5ML suspension Take 30 mLs by mouth daily as needed for mild constipation.   Yes [provider]  metoprolol tartrate (LOPRESSOR) 50 MG tablet Take 50 mg by mouth 2 (two) times daily.   Yes [provider]  montelukast (SINGULAIR) 10 MG tablet Take 10  mg by mouth at bedtime.   Yes [provider]  Multiple Vitamins-Minerals (CERTAVITE SENIOR) TABS Take 1 tablet by mouth daily.   Yes [provider]  nitroGLYCERIN (NITROSTAT) 0.4 MG SL tablet Place 1 tablet (0.4 mg total) under the tongue every 5 (five) minutes as needed for chest pain. 01/12/21  Yes Wendall Stade, MD  pantoprazole (PROTONIX) 40 MG tablet Take 1 tablet (40 mg total) by mouth daily. 07/03/23  Yes Rhetta Mura, MD  Sodium Phosphates (RA SALINE ENEMA RE) Place 1 enema rectally daily as needed (Constipation unrelieved by milk of mag or bisacodyl suppository).   Yes [provider]  sulfaSALAzine (AZULFIDINE) 500 MG tablet Take 1,000 mg by mouth 4 (four) times daily.   Yes [provider]  QUEtiapine (SEROQUEL) 25 MG tablet Take 1 tablet (25 mg total) by mouth at bedtime as needed (agitation). Patient not taking: Reported on 07/19/2023 07/02/23   Rhetta Mura, MD  traMADol HCl 25 MG TABS Take 25 mg by mouth every 8 (eight) hours. Patient not taking: Reported on 07/21/2023    [provider]    Physical Exam: BP (!) 120/93   Pulse (!) 143   Temp 98.7 F (37.1 C) (Oral)   Resp 20   SpO2 98%   General:  Alert, oriented to self and place, calm, in no acute distress, elderly gentleman who looks chronically ill, his friend Barry [name] is at the bedside.  Mucous membranes are dry appearing Eyes: EOMI, clear conjuctivae, white sclerea Neck: supple, no masses, trachea mildline  Cardiovascular: RRR, no murmurs or rubs, no peripheral edema  Respiratory: clear to auscultation bilaterally, no wheezes, no crackles  Abdomen: soft, nontender, nondistended, normal bowel tones heard  Skin: dry, no rashes  Musculoskeletal: no joint effusions, normal range of motion  Psychiatric: appropriate affect, normal speech  Neurologic: extraocular muscles intact, clear speech, moving all extremities with intact sensorium  GU: Foley catheter in place  with concentrated appearing urine         Labs on Admission:  Basic Metabolic Panel: Recent Labs  Lab 07/21/23 1205  NA 131*  K 3.9  CL 101  CO2 18*  GLUCOSE 112*  BUN 19  CREATININE 0.81  CALCIUM 9.1   Liver Function Tests: Recent Labs  Lab 07/21/23 1205  AST 38  ALT 30  ALKPHOS 105  BILITOT 0.5  PROT 6.5  ALBUMIN 2.6*   No results for input(s): "LIPASE", "AMYLASE" in the last 168 hours. No results for input(s): "AMMONIA" in the last 168 hours. CBC: Recent Labs  Lab 07/21/23 1205  WBC 9.1  NEUTROABS 6.7  HGB 9.6*  HCT 32.3*  MCV 92.3  PLT 443*   Cardiac Enzymes: No results for input(s): "CKTOTAL", "CKMB", "CKMBINDEX", "TROPONINI" in the last 168 hours.  BNP (last 3 results) No results for input(s): "BNP" in the last 8760 hours.  ProBNP (last 3 results) No results for input(s): "PROBNP" in the last 8760 hours.  CBG: No results for input(s): "GLUCAP" in the last 168 hours.  Radiological Exams on Admission: CT Head Wo Contrast  Result Date: 07/21/2023 CLINICAL DATA:  Mental status change, unknown cause EXAM: CT HEAD WITHOUT CONTRAST TECHNIQUE: Contiguous axial images were obtained from the base of the skull through the vertex without intravenous contrast. RADIATION DOSE REDUCTION: This exam was performed according to the departmental dose-optimization program which includes automated exposure control, adjustment of the mA and/or kV according to patient size and/or use of iterative reconstruction technique. COMPARISON:  None Available. FINDINGS: Brain: No evidence of acute infarction, hemorrhage, hydrocephalus, extra-axial collection or mass lesion/mass effect. Sequela of mild chronic microvascular ischemic change. Generalized volume loss. Vascular: No hyperdense vessel or unexpected calcification. Skull: Normal. Negative for fracture or focal lesion. Sinuses/Orbits: No middle ear or mastoid effusion. Paranasal sinuses are clear. Bilateral lens replacement. Orbits  are otherwise unremarkable. Other: None. IMPRESSION: No acute intracranial abnormality. Electronically Signed   By: Lorenza [name] M.D.   On: 07/21/2023 13:31   DG Chest Port 1 View  Result Date: 07/21/2023 CLINICAL DATA:  Altered mental status, questionable sepsis. EXAM: PORTABLE CHEST 1 VIEW COMPARISON:  Chest radiograph dated 06/07/2021. FINDINGS: The heart size and mediastinal contours are within normal limits. The lung volumes are low. There is mild left basilar atelectasis/airspace disease. The right lung is clear. No large pleural effusion or pneumothorax is identified. The visualized skeletal structures are unremarkable. IMPRESSION: Low lung volumes with mild left basilar atelectasis/airspace disease. Electronically Signed   By: Romona Curls M.D.   On: 07/21/2023 13:15    Assessment/Plan 77 year old gentleman with history of atrial fibrillation not on anticoagulation, prior CVA, hypertension, recent TURBT with chronic Foley catheter being admitted to the hospital with altered mental status likely due to catheter associated UTI.  Catheter associated UTI-due to chronic Foley catheter from urinary retention.  Follow-up as an outpatient by urology. -Inpatient admission, not meeting sepsis criteria -Follow urine culture data -Continue empiric IV Rocephin -Will ensure that urine culture is obtained from new catheter  Hyponatremia-chronic and stable, unlikely to be of clinical significance  Atrial fibrillation-continue Lopressor and Cardizem, note he was off of his Xarelto during his last hospital stay due to persistent hematuria  Deficiency anemia-stable, continue iron supplementation  Hypertension-continue losartan  Chronic diarrhea-continue Questran, likely would benefit from outpatient GI workup, note stool studies in June that were negative for infectious cause.  C. difficile was apparently checked as an outpatient and negative, will recheck now and start Imodium if negative. -Follow-up  C. Difficile -If C. difficile negative, start Imodium as needed  History of bladder tumor-status post recent resection in July, continue chronic Foley catheter due to retention.  Has repeat surgery scheduled with Dr. Annabell Howells later this  week. -Secure chat message sent to Dr. Annabell Howells to alert him of the patient's admission  DVT prophylaxis: Lovenox   DNR-confirmed with his friend Barry [name] at the bedside this afternoon, who is also his healthcare power of attorney.  Consults called: None  Admission status: The appropriate patient status for this patient is INPATIENT. Inpatient status is judged to be reasonable and necessary in order to provide the required intensity of service to ensure the patient's safety. The patient's presenting symptoms, physical exam findings, and initial radiographic and laboratory data in the context of their chronic comorbidities is felt to place them at high risk for further clinical deterioration. Furthermore, it is not anticipated that the patient will be medically stable for discharge from the hospital within 2 midnights of admission.    I certify that at the point of admission it is my clinical judgment that the patient will require inpatient hospital care spanning beyond 2 midnights from the point of admission due to high intensity of service, high risk for further deterioration and high frequency of surveillance required  Time spent: 49 minutes   Sharlette Dense MD [facility] Pager [phone]  If 7PM-7AM, please contact night-coverage [email or web address] Password [facility]  07/21/2023, 2:47 PM

## 2023-07-21 NOTE — ED Provider Notes (Signed)
[facility] AT [facility] Provider Note   CSN: [id number] Arrival date & time: 07/21/23  1131     History  Chief Complaint  Patient presents with   Altered Mental Status    BRONX BROGDEN is a 77 y.o. male.  77 year old male who presented from nursing home due to altered mental status.  Concern for possible UTI.  Patient was found by staff this morning not acting himself.  Normally he is awake and alert and oriented x 4.  Does have a history of A-fib and does not appear to be on blood thinners.  No reported fever or emesis.  Has had diffuse weakness and some visual loose Nations.  Patient given half liter of saline and transported here       Home Medications Prior to Admission medications   Medication Sig Start Date End Date Taking? Authorizing Provider  acetaminophen (TYLENOL) 325 MG tablet Take 2 tablets (650 mg total) by mouth every 4 (four) hours as needed for mild pain (or Fever >/= 101). 06/10/23   Lewie Chamber, MD  albuterol (VENTOLIN HFA) 108 (90 Base) MCG/ACT inhaler Inhale 2 puffs into the lungs every 4 (four) hours as needed for wheezing or shortness of breath.    [provider]  atorvastatin (LIPITOR) 40 MG tablet Take 40 mg by mouth daily.    [provider]  bisacodyl (DULCOLAX) 10 MG suppository Place 10 mg rectally daily as needed (Constipation not relieved by milk of magnesium).    [provider]  budesonide-formoterol (SYMBICORT) 160-4.5 MCG/ACT inhaler Inhale 2 puffs into the lungs 2 (two) times daily.    [provider]  carboxymethylcellulose (REFRESH PLUS) 0.5 % SOLN Place 2 drops into both eyes every 8 (eight) hours as needed (dry eyes).    [provider]  Cholecalciferol (VITAMIN D) 50 MCG (2000 UT) tablet Take 2,000 Units by mouth daily.     [provider]  cholestyramine (QUESTRAN) 4 g packet Take 1 packet by mouth 2 (two) times daily. 07/02/23 07/01/24  Rhetta Mura, MD  cycloSPORINE (RESTASIS) 0.05 % ophthalmic emulsion Place 1 drop into both eyes 2 (two) times daily.    [provider]  diltiazem (CARDIZEM CD) 240 MG 24 hr capsule Take 240 mg by mouth daily.    [provider]  escitalopram (LEXAPRO) 20 MG tablet Take 1 tablet (20 mg total) by mouth daily. 07/02/23   Rhetta Mura, MD  Eyelid Cleansers (OCUSOFT EYELID CLEANSING) PADS Apply topically as needed. Use daily    [provider]  ezetimibe (ZETIA) 10 MG tablet Take 10 mg by mouth daily.    [provider]  Ferrous Sulfate (SLOW FE) 142 (45 Fe) MG TBCR Take 1 tablet by mouth daily.    [provider]  latanoprost (XALATAN) 0.005 % ophthalmic solution Place 1 drop into both eyes at bedtime.    [provider]  losartan (COZAAR) 25 MG tablet Take 25 mg by mouth daily.    [provider]  magnesium hydroxide (MILK OF MAGNESIA) 400 MG/5ML suspension Take 30 mLs by mouth daily as needed for mild constipation.    [provider]  metoprolol tartrate (LOPRESSOR) 50 MG tablet Take 50 mg by mouth 2 (two) times daily.    [provider]  montelukast (SINGULAIR) 10 MG tablet Take 10 mg by mouth at bedtime.    [provider]  Multiple Vitamins-Minerals (CENTRUM SILVER PO) Take 1 tablet by mouth daily.  [provider]  nitroGLYCERIN (NITROSTAT) 0.4 MG SL tablet Place 1 tablet (0.4 mg total) under the tongue every 5 (five) minutes as needed for chest pain. 01/12/21   Wendall Stade, MD  pantoprazole (PROTONIX) 40 MG tablet Take 1 tablet (40 mg total) by mouth daily. 07/03/23   Rhetta Mura, MD  QUEtiapine (SEROQUEL) 25 MG tablet Take 1 tablet (25 mg total) by mouth at bedtime as needed (agitation). Patient not taking: Reported on 07/19/2023 07/02/23   Rhetta Mura, MD  Sodium Phosphates (RA SALINE ENEMA RE) Place 1 enema rectally daily as needed (Constipation unrelieved by milk of  mag or bisacodyl suppository).    [provider]  sulfaSALAzine (AZULFIDINE) 500 MG tablet Take 1,000 mg by mouth 4 (four) times daily.    [provider]      Allergies    Piroxicam    Review of Systems   Review of Systems  Unable to perform ROS: Mental status change    Physical Exam Updated Vital Signs BP 116/77 (BP Location: Left Arm)   Pulse (!) 121   Temp 98.7 F (37.1 C) (Rectal)   Resp (!) 24   SpO2 93%  Physical Exam Vitals and nursing note reviewed.  Constitutional:      General: He is not in acute distress.    Appearance: Normal appearance. He is well-developed. He is not toxic-appearing.  HENT:     Head: Normocephalic and atraumatic.  Eyes:     General: Lids are normal.     Conjunctiva/sclera: Conjunctivae normal.     Pupils: Pupils are equal, round, and reactive to light.  Neck:     Thyroid: No thyroid mass.     Trachea: No tracheal deviation.  Cardiovascular:     Rate and Rhythm: Normal rate and regular rhythm.     Heart sounds: Normal heart sounds. No murmur heard.    No gallop.  Pulmonary:     Effort: Pulmonary effort is normal. No respiratory distress.     Breath sounds: Normal breath sounds. No stridor. No decreased breath sounds, wheezing, rhonchi or rales.  Abdominal:     General: There is no distension.     Palpations: Abdomen is soft.     Tenderness: There is no abdominal tenderness. There is no rebound.  Musculoskeletal:        General: No tenderness. Normal range of motion.     Cervical back: Normal range of motion and neck supple.  Skin:    General: Skin is warm and dry.     Findings: No abrasion or rash.  Neurological:     Mental Status: He is oriented to person, place, and time. He is lethargic.     GCS: GCS eye subscore is 4. GCS verbal subscore is 5. GCS motor subscore is 6.     Cranial Nerves: Cranial nerves are intact. No cranial nerve deficit.     Sensory: No sensory deficit.     Motor: No tremor.  Psychiatric:         Attention and Perception: Attention normal.        Mood and Affect: Affect is blunt.     ED Results / Procedures / Treatments   Labs (all labs ordered are listed, but only abnormal results are displayed) Labs Reviewed  RESP PANEL BY RT-PCR (RSV, FLU A&B, COVID)  [id number]  CULTURE, BLOOD (ROUTINE X 2)  CULTURE, BLOOD (ROUTINE X 2)  COMPREHENSIVE METABOLIC PANEL  CBC WITH DIFFERENTIAL/PLATELET  PROTIME-INR  APTT  URINALYSIS, W/ REFLEX TO CULTURE (INFECTION SUSPECTED)  I-STAT CG4 LACTIC ACID, ED    EKG EKG Interpretation Date/Time:  Sunday July 21 2023 12:12:31 EDT Ventricular Rate:  114 PR Interval:  170 QRS Duration:  101 QT Interval:  340 QTC Calculation: 469 R Axis:   -22  Text Interpretation: Sinus tachycardia with irregular rate Abnormal R-wave progression, early transition Inferior infarct, old Confirmed by Lorre Nick ([id number]) on 07/21/2023 1:10:28 PM  Radiology No results found.  Procedures Procedures    Medications Ordered in ED Medications  lactated ringers infusion (has no administration in time range)  lactated ringers bolus 1,000 mL (has no administration in time range)    And  lactated ringers bolus 1,000 mL (has no administration in time range)    And  lactated ringers bolus 1,000 mL (has no administration in time range)  cefTRIAXone (ROCEPHIN) 2 g in sodium chloride 0.9 % 100 mL IVPB (has no administration in time range)    ED Course/ Medical Decision Making/ A&P                                 Medical Decision Making Amount and/or Complexity of Data Reviewed Labs: ordered. Radiology: ordered. ECG/medicine tests: ordered.  Risk Prescription drug management.   Patient is EKG per interpretation shows sinus tach.  Sepsis protocol started.  Given IV fluids as well as IV antibiotics.  Urinalysis consistent with infection.  Head CT performed due to altered to status and per my dictation no acute findings.  Chest x-ray also -2.  Plan will  be to admit the patient to the hospital.  Will consult hospitalist team        Final Clinical Impression(s) / ED Diagnoses Final diagnoses:  None    Rx / DC Orders ED Discharge Orders     None         Lorre Nick, MD 07/21/23 1337

## 2023-07-21 NOTE — ED Notes (Signed)
ED TO INPATIENT HANDOFF REPORT  ED Nurse Name and Phone #: [phone]   S Name/Age/Gender Barry [name] 77 y.o. male Room/Bed: WA21/WA21  Code Status   Code Status: DNR  Home/SNF/Other Nursing Home  possibly a hospice house Patient oriented to: self and place Is this baseline? Yes      Chief Complaint UTI (urinary tract infection) [N39.0]  Triage Note Pt arrives via [facility] from [facility] for altered mental status. Staff reported LKW 1900 yesterday and has been having decreased orientation and visual hallucinations as well as worsening weakness. Pt met sepsis criteria with EMS in the field. Given 500 mL LR bolus enroute.    Allergies Allergies  Allergen Reactions   Piroxicam Itching and Rash    Level of Care/Admitting Diagnosis ED Disposition     ED Disposition  Admit   Condition  --   Comment  Hospital Area: [facility] [[id number]]  Level of Care: Med-Surg [16]  May admit patient to [facility] or [facility] if equivalent level of care is available:: Yes  Covid Evaluation: Asymptomatic - no recent exposure (last 10 days) testing not required  Diagnosis: UTI (urinary tract infection) [829562]  Admitting Physician: Maryln Gottron [[id number]]  Attending Physician: Olexa.Dam, MIR Jaxson.Roy [[id number]]  Certification:: I certify this patient will need inpatient services for at least 2 midnights  Estimated Length of Stay: 3          B Medical/Surgery History Past Medical History:  Diagnosis Date   A-fib (HCC)    Acute sinusitis 03/24/2012   Followed in Pulmonary clinic/ [facility]/ [name] - CT sinus 08/07/2013  Short air-fluid levels in the maxillary sinuses bilaterally suggesting early acute sinusitis. - repeat ct sinus 04/30/2014 > ok     Acute sinusitis, unspecified 08/14/2010   Qualifier: Diagnosis of  By: Clent Ridges NP, [name]     Anemia    Asthma    PFTs 06/15/05 FEV1 85% predicted ratio 68% and truncation of resp loop in a sawtooth pattern.  HFA 25% 08-01-2009 >50% Nov 01, 2009>50% 01-31-2010   Asthma, chronic 11/24/2007   Followed in Pulmonary clinic/ [facility]/ [name]  - PFTs 06/15/2005 FEV1 85% predicted ratio 68% and truncation of respiratory loop in a sawtooth pattern  - HFA 25% August 01, 2009 > 50% November 01, 2009 > 50% January 31, 2010 therefore changed to neb bud/brovana - HFA 75% p extensive coaching 08/07/2013  > insurance issues so try symbicort 160 2 bid instead of neb - 11/16/2015   p extensive   Bladder mass    BRONCHITIS, ACUTE 01/22/2011   Qualifier: Diagnosis of  By: Clent Ridges NP, [name]     CAD (coronary artery disease), native coronary artery 02/06/2019   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   Cataract    Chronic cough    Chronic rhinitis 05/02/2009   Followed in Pulmonary clinic/ [facility]/ [name]    - 03/24/12 CT Sinus > Negative paranasal sinuses - Sinus CT 08/07/2013 >>Short air-fluid levels in the maxillary sinuses bilaterally suggesting early acute sinusitis - Repeat augmentin x 21 days 03/27/2014 then sinus ct if not better  - flonase/afrin regimen 02/11/15 >>>      Coronary heart disease    COUGH, CHRONIC 11/24/2007   Followed in Pulmonary clinic/ [facility]/ [name]      DDD (degenerative disc disease), lumbar    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   Followed in Pulmonary clinic/ [facility]/ [name]  -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     Excessive ear wax, bilateral 09/15/2018   GASTROESOPHAGEAL REFLUX DISEASE 08/27/2008   Followed as Primary Care Patient/ GI/ Dr  Danise Edge     Glaucoma    Hyperlipidemia LDL goal <70 02/06/2019   Hypertension    Obesity    Osteoarthritis    Paralyzed hemidiaphragm    left, 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   Retina disorder 2014   Severe obesity (BMI >= 40) (HCC) 11/24/2007       - Target wt < 191 to get under BMI 30    Ulcerative colitis    ULCERATIVE COLITIS 11/24/2007   Followed as Primary Care Patient/ GI/ Dr  Danise Edge     URI 04/09/2008   Qualifier: Diagnosis of  By: Clent Ridges NP, [name]     Past Surgical History:  Procedure Laterality Date   CARDIAC CATHETERIZATION Left    COLONOSCOPY WITH PROPOFOL N/A 11/29/2015   Procedure: COLONOSCOPY WITH PROPOFOL;  Surgeon: Charolett Bumpers, MD;  Location: [facility];  Service: Endoscopy;  Laterality: N/A;   CORONARY ARTERY BYPASS GRAFT     x6   TRANSURETHRAL RESECTION OF BLADDER TUMOR N/A 06/28/2023   Procedure: CYSTOSCOPY TRANSURETHRAL RESECTION OF BLADDER TUMOR (TURBT);  Surgeon: Bjorn Pippin, MD;  Location: [facility];  Service: Urology;  Laterality: N/A;     A IV Location/Drains/Wounds Patient Lines/Drains/Airways Status     Active Line/Drains/Airways     Name Placement date Placement time Site Days   Peripheral IV 07/21/23 22 G 1" Posterior;Right Hand 07/21/23  1205  Hand  less than 1   Peripheral IV 07/21/23 20 G 1" Right Antecubital 07/21/23  1236  Antecubital  less than 1   Peripheral IV 07/21/23 20 G Left Antecubital 07/21/23  1131  Antecubital  less than 1   Urethral Catheter Gabby E. RN Straight-tip 20 Fr. 07/21/23  1235  Straight-tip  less than 1   Pressure Injury 06/03/23 Buttocks Bilateral Stage 2 -  Partial thickness loss of dermis presenting as a shallow open injury with a red, pink wound bed without slough. 06/03/23  1740  -- 48            Intake/Output Last 24 hours No intake or output data in the 24 hours ending 07/21/23 1617  Labs/Imaging Results for orders placed or performed during the hospital encounter of 07/21/23 (from the past 48 hour(s))  Comprehensive metabolic panel     Status: Abnormal   Collection Time: 07/21/23 12:05 PM  Result Value Ref Range   Sodium 131 (L) 135 - 145 mmol/L   Potassium 3.9 3.5 - 5.1 mmol/L   Chloride 101 98 - 111 mmol/L   CO2 18 (L) 22 - 32 mmol/L   Glucose, Bld 112 (H) 70 - 99 mg/dL    Comment: Glucose reference range applies only to samples taken after fasting for at least 8 hours.   BUN  19 8 - 23 mg/dL   Creatinine, Ser 4.09 0.61 - 1.24 mg/dL   Calcium 9.1 8.9 - 81.1 mg/dL   Total Protein 6.5 6.5 - 8.1 g/dL   Albumin 2.6 (L) 3.5 - 5.0 g/dL   AST 38 15 - 41 U/L   ALT 30 0 - 44 U/L   Alkaline Phosphatase 105 38 - 126 U/L   Total Bilirubin 0.5 0.3 - 1.2 mg/dL   GFR, Estimated >91 >47 mL/min    Comment: (NOTE) Calculated using the CKD-EPI Creatinine Equation (2021)    Anion gap 12 5 - 15  Comment: Performed at [facility], [address], [place], [place] [address]  CBC with Differential     Status: Abnormal   Collection Time: 07/21/23 12:05 PM  Result Value Ref Range   WBC 9.1 4.0 - 10.5 K/uL   RBC 3.50 (L) 4.22 - 5.81 MIL/uL   Hemoglobin 9.6 (L) 13.0 - 17.0 g/dL   HCT 82.9 (L) 56.2 - 13.0 %   MCV 92.3 80.0 - 100.0 fL   MCH 27.4 26.0 - 34.0 pg   MCHC 29.7 (L) 30.0 - 36.0 g/dL   RDW 86.5 (H) 78.4 - 69.6 %   Platelets 443 (H) 150 - 400 K/uL   nRBC 0.3 (H) 0.0 - 0.2 %   Neutrophils Relative % 74 %   Neutro Abs 6.7 1.7 - 7.7 K/uL   Lymphocytes Relative 13 %   Lymphs Abs 1.2 0.7 - 4.0 K/uL   Monocytes Relative 10 %   Monocytes Absolute 0.9 0.1 - 1.0 K/uL   Eosinophils Relative 1 %   Eosinophils Absolute 0.1 0.0 - 0.5 K/uL   Basophils Relative 1 %   Basophils Absolute 0.1 0.0 - 0.1 K/uL   Immature Granulocytes 1 %   Abs Immature Granulocytes 0.12 (H) 0.00 - 0.07 K/uL    Comment: Performed at [facility], [address] [place], [place] [address]  Protime-INR     Status: None   Collection Time: 07/21/23 12:05 PM  Result Value Ref Range   Prothrombin Time 14.2 11.4 - 15.2 seconds   INR 1.1 0.8 - 1.2    Comment: (NOTE) INR goal varies based on device and disease states. Performed at [facility], [address] [place], [place] [address]   APTT     Status: None   Collection Time: 07/21/23 12:05 PM  Result Value Ref Range   aPTT 26 24 - 36 seconds    Comment: Performed at [facility], [address], [place], [place] [address]  I-Stat Lactic Acid, ED     Status: None   Collection Time: 07/21/23 12:25 PM  Result Value Ref Range   Lactic Acid, Venous 1.9 0.5 - 1.9 mmol/L  Urinalysis, w/ Reflex to Culture (Infection Suspected) -Urine, Clean Catch     Status: Abnormal   Collection Time: 07/21/23 12:35 PM  Result Value Ref Range   Specimen Source URINE, CLEAN CATCH    Color, Urine AMBER (A) YELLOW    Comment: BIOCHEMICALS MAY BE AFFECTED BY COLOR   APPearance TURBID (A) CLEAR   Specific Gravity, Urine 1.017 1.005 - 1.030   pH 5.0 5.0 - 8.0   Glucose, UA NEGATIVE NEGATIVE mg/dL   Hgb urine dipstick MODERATE (A) NEGATIVE   Bilirubin Urine NEGATIVE NEGATIVE   Ketones, ur NEGATIVE NEGATIVE mg/dL   Protein, ur 725 (A) NEGATIVE mg/dL   Nitrite NEGATIVE NEGATIVE   Leukocytes,Ua LARGE (A) NEGATIVE   RBC / HPF >50 0 - 5 RBC/hpf   WBC, UA >50 0 - 5 WBC/hpf    Comment:        Reflex urine culture not performed if WBC <=10, OR if Squamous epithelial cells >5. If Squamous epithelial cells >5 suggest recollection.    Bacteria, UA MANY (A) NONE SEEN   Squamous Epithelial / HPF 0-5 0 - 5 /HPF   WBC Clumps PRESENT    Mucus PRESENT     Comment: Performed at  Hospital, [address], [place], [place] [address]  Resp  panel by RT-PCR (RSV, Flu A&B, Covid) Anterior Nasal Swab     Status: None   Collection Time: 07/21/23  2:14 PM   Specimen: Anterior Nasal Swab  Result Value Ref Range   SARS Coronavirus 2 by RT PCR NEGATIVE NEGATIVE    Comment: (NOTE) SARS-CoV-2 target nucleic acids are NOT DETECTED.  The SARS-CoV-2 RNA is generally detectable in upper respiratory specimens during the acute phase of infection. The lowest concentration of SARS-CoV-2 viral copies this assay can detect is 138 copies/mL. A negative result does not preclude SARS-Cov-2 infection and should not be used as the sole basis for treatment or other patient management  decisions. A negative result may occur with  improper specimen collection/handling, submission of specimen other than nasopharyngeal swab, presence of viral mutation(s) within the areas targeted by this assay, and inadequate number of viral copies(<138 copies/mL). A negative result must be combined with clinical observations, patient history, and epidemiological information. The expected result is Negative.  Fact Sheet for Patients:  BloggerCourse.com  Fact Sheet for Healthcare Providers:  SeriousBroker.it  This test is no t yet approved or cleared by the [place] FDA and  has been authorized for detection and/or diagnosis of SARS-CoV-2 by FDA under an Emergency Use Authorization (EUA). This EUA will remain  in effect (meaning this test can be used) for the duration of the COVID-19 declaration under Section 564(b)(1) of the Act, 21 U.S.C.section 360bbb-3(b)(1), unless the authorization is terminated  or revoked sooner.       Influenza A by PCR NEGATIVE NEGATIVE   Influenza B by PCR NEGATIVE NEGATIVE    Comment: (NOTE) The Xpert Xpress SARS-CoV-2/FLU/RSV plus assay is intended as an aid in the diagnosis of influenza from Nasopharyngeal swab specimens and should not be used as a sole basis for treatment. Nasal washings and aspirates are unacceptable for Xpert Xpress SARS-CoV-2/FLU/RSV testing.  Fact Sheet for Patients: BloggerCourse.com  Fact Sheet for Healthcare Providers: SeriousBroker.it  This test is not yet approved or cleared by the [place] FDA and has been authorized for detection and/or diagnosis of SARS-CoV-2 by FDA under an Emergency Use Authorization (EUA). This EUA will remain in effect (meaning this test can be used) for the duration of the COVID-19 declaration under Section 564(b)(1) of the Act, 21 U.S.C. section 360bbb-3(b)(1), unless the authorization  is terminated or revoked.     Resp Syncytial Virus by PCR NEGATIVE NEGATIVE    Comment: (NOTE) Fact Sheet for Patients: BloggerCourse.com  Fact Sheet for Healthcare Providers: SeriousBroker.it  This test is not yet approved or cleared by the [place] FDA and has been authorized for detection and/or diagnosis of SARS-CoV-2 by FDA under an Emergency Use Authorization (EUA). This EUA will remain in effect (meaning this test can be used) for the duration of the COVID-19 declaration under Section 564(b)(1) of the Act, 21 U.S.C. section 360bbb-3(b)(1), unless the authorization is terminated or revoked.  Performed at [facility], [address] [place], [place] [address]    CT Head Wo Contrast  Result Date: 07/21/2023 CLINICAL DATA:  Mental status change, unknown cause EXAM: CT HEAD WITHOUT CONTRAST TECHNIQUE: Contiguous axial images were obtained from the base of the skull through the vertex without intravenous contrast. RADIATION DOSE REDUCTION: This exam was performed according to the departmental dose-optimization program which includes automated exposure control, adjustment of the mA and/or kV according to patient size and/or use of iterative reconstruction technique. COMPARISON:  None Available. FINDINGS: Brain: No evidence of acute infarction,  hemorrhage, hydrocephalus, extra-axial collection or mass lesion/mass effect. Sequela of mild chronic microvascular ischemic change. Generalized volume loss. Vascular: No hyperdense vessel or unexpected calcification. Skull: Normal. Negative for fracture or focal lesion. Sinuses/Orbits: No middle ear or mastoid effusion. Paranasal sinuses are clear. Bilateral lens replacement. Orbits are otherwise unremarkable. Other: None. IMPRESSION: No acute intracranial abnormality. Electronically Signed   By: Lorenza [name] M.D.   On: 07/21/2023 13:31   DG Chest Port 1 View  Result Date:  07/21/2023 CLINICAL DATA:  Altered mental status, questionable sepsis. EXAM: PORTABLE CHEST 1 VIEW COMPARISON:  Chest radiograph dated 06/07/2021. FINDINGS: The heart size and mediastinal contours are within normal limits. The lung volumes are low. There is mild left basilar atelectasis/airspace disease. The right lung is clear. No large pleural effusion or pneumothorax is identified. The visualized skeletal structures are unremarkable. IMPRESSION: Low lung volumes with mild left basilar atelectasis/airspace disease. Electronically Signed   By: Romona Curls M.D.   On: 07/21/2023 13:15    Pending Labs Unresulted Labs (From admission, onward)     Start     Ordered   07/22/23 0500  Basic metabolic panel  Tomorrow morning,   R        07/21/23 1448   07/22/23 0500  CBC  Tomorrow morning,   R        07/21/23 1448   07/21/23 1452  C Difficile Quick Screen w PCR reflex  (C Difficile quick screen w PCR reflex panel )  Once, for 24 hours,   TIMED       References:    CDiff Information Tool   07/21/23 1451   07/21/23 1235  Urine Culture  Once,   R        07/21/23 1235   07/21/23 1155  Blood Culture (routine x 2)  (Septic presentation on arrival (screening labs, nursing and treatment orders for obvious sepsis))  BLOOD CULTURE X 2,   STAT      07/21/23 1155            Vitals/Pain Today's Vitals   07/21/23 1148 07/21/23 1154 07/21/23 1400  BP: 116/77  (!) 120/93  Pulse: (!) 121  (!) 143  Resp:  (!) 24 20  Temp: 98.7 F (37.1 C)  98.7 F (37.1 C)  TempSrc: Rectal  Oral  SpO2: 93%  98%    Isolation Precautions Enteric precautions (UV disinfection)  Medications Medications  lactated ringers infusion (has no administration in time range)  cefTRIAXone (ROCEPHIN) 1 g in sodium chloride 0.9 % 100 mL IVPB (has no administration in time range)  atorvastatin (LIPITOR) tablet 40 mg (has no administration in time range)  cholestyramine (QUESTRAN) packet 1 packet (has no administration in time  range)  diltiazem (CARDIZEM CD) 24 hr capsule 240 mg (has no administration in time range)  ezetimibe (ZETIA) tablet 10 mg (has no administration in time range)  losartan (COZAAR) tablet 25 mg (has no administration in time range)  metoprolol tartrate (LOPRESSOR) tablet 50 mg (has no administration in time range)  escitalopram (LEXAPRO) tablet 20 mg (has no administration in time range)  pantoprazole (PROTONIX) EC tablet 40 mg (has no administration in time range)  ferrous sulfate tablet 325 mg (has no administration in time range)  mometasone-formoterol (DULERA) 200-5 MCG/ACT inhaler 2 puff (has no administration in time range)  montelukast (SINGULAIR) tablet 10 mg (has no administration in time range)  cycloSPORINE (RESTASIS) 0.05 % ophthalmic emulsion 1 drop (has no administration in time range)  latanoprost (  XALATAN) 0.005 % ophthalmic solution 1 drop (has no administration in time range)  enoxaparin (LOVENOX) injection 40 mg (has no administration in time range)  acetaminophen (TYLENOL) tablet 650 mg (has no administration in time range)    Or  acetaminophen (TYLENOL) suppository 650 mg (has no administration in time range)  albuterol (PROVENTIL) (2.5 MG/3ML) 0.083% nebulizer solution 2.5 mg (has no administration in time range)  lactated ringers bolus 1,000 mL (1,000 mLs Intravenous New Bag/Given 07/21/23 1546)    And  lactated ringers bolus 1,000 mL (1,000 mLs Intravenous New Bag/Given 07/21/23 1239)    And  lactated ringers bolus 1,000 mL (1,000 mLs Intravenous New Bag/Given 07/21/23 1250)    Mobility non-ambulatory     Focused Assessments     R Recommendations: See Admitting Provider Note  Report given to:   Additional Notes:

## 2023-07-21 NOTE — Progress Notes (Signed)
   07/21/23 1724  Assess: MEWS Score  Temp 97.7 F (36.5 C)  BP 127/86  MAP (mmHg) 95  Pulse Rate (!) 132  Resp 19  Level of Consciousness Alert  SpO2 100 %  O2 Device Room Air  Patient Activity (if Appropriate) In bed  Assess: MEWS Score  MEWS Temp 0  MEWS Systolic 0  MEWS Pulse 3  MEWS RR 0  MEWS LOC 0  MEWS Score 3  MEWS Score Color Yellow  Assess: if the MEWS score is Yellow or Red  Were vital signs accurate and taken at a resting state? Yes  Does the patient meet 2 or more of the SIRS criteria? No  MEWS guidelines implemented  Yes, yellow  Treat  MEWS Interventions Considered administering scheduled or prn medications/treatments as ordered  Take Vital Signs  Increase Vital Sign Frequency  Yellow: Q2hr x1, continue Q4hrs until patient remains green for 12hrs  Escalate  MEWS: Escalate Yellow: Discuss with charge nurse and consider notifying provider and/or RRT  Notify: Charge Nurse/RN  Name of Charge Nurse/RN Notified Jiles Prows, RN  Provider Notification  Provider Name/Title Mir Sharlette Dense, MD  Date Provider Notified 07/21/23  Time Provider Notified 1728  Method of Notification Page  Notification Reason Other (Comment) (elevated HR)  Provider response Other (Comment) (MD told RN to give scheduled Cardizem and continue to monitor HR)  Date of Provider Response 07/21/23  Time of Provider Response 1730  Assess: SIRS CRITERIA  SIRS Temperature  0  SIRS Pulse 1  SIRS Respirations  0  SIRS WBC 0  SIRS Score Sum  1

## 2023-07-21 NOTE — Plan of Care (Signed)

## 2023-07-21 NOTE — Plan of Care (Signed)
  Problem: Nutrition: Goal: Adequate nutrition will be maintained Outcome: Progressing   Problem: Coping: Goal: Level of anxiety will decrease Outcome: Progressing   Problem: Pain Managment: Goal: General experience of comfort will improve Outcome: Progressing   

## 2023-07-21 NOTE — ED Triage Notes (Signed)
Pt arrives via [facility] from [facility] for altered mental status. Staff reported LKW 1900 yesterday and has been having decreased orientation and visual hallucinations as well as worsening weakness. Pt met sepsis criteria with EMS in the field. Given 500 mL LR bolus enroute.

## 2023-07-21 NOTE — Progress Notes (Signed)
[facility] following for sepsis protocol. 

## 2023-07-22 DIAGNOSIS — E871 Hypo-osmolality and hyponatremia: Secondary | ICD-10-CM | POA: Diagnosis not present

## 2023-07-22 DIAGNOSIS — N390 Urinary tract infection, site not specified: Secondary | ICD-10-CM | POA: Diagnosis not present

## 2023-07-22 DIAGNOSIS — R310 Gross hematuria: Secondary | ICD-10-CM

## 2023-07-22 DIAGNOSIS — R5381 Other malaise: Secondary | ICD-10-CM

## 2023-07-22 DIAGNOSIS — T83511A Infection and inflammatory reaction due to indwelling urethral catheter, initial encounter: Secondary | ICD-10-CM | POA: Diagnosis not present

## 2023-07-22 DIAGNOSIS — K529 Noninfective gastroenteritis and colitis, unspecified: Secondary | ICD-10-CM

## 2023-07-22 DIAGNOSIS — D494 Neoplasm of unspecified behavior of bladder: Secondary | ICD-10-CM

## 2023-07-22 DIAGNOSIS — I480 Paroxysmal atrial fibrillation: Secondary | ICD-10-CM

## 2023-07-22 LAB — BPAM RBC
Blood Product Expiration Date: 202408292359
Blood Product Expiration Date: 202408292359
Blood Product Expiration Date: 202408292359
ISSUE DATE / TIME: 202408051328
Unit Type and Rh: 6200
Unit Type and Rh: 6200
Unit Type and Rh: 6200

## 2023-07-22 LAB — TYPE AND SCREEN
ABO/RH(D): A POS
Antibody Screen: NEGATIVE
Unit division: 0
Unit division: 0
Unit division: 0

## 2023-07-22 LAB — PREPARE RBC (CROSSMATCH)

## 2023-07-22 MED ORDER — LOPERAMIDE HCL 2 MG PO CAPS: 4.0000 mg | ORAL_CAPSULE | ORAL | Status: DC | PRN

## 2023-07-22 MED ORDER — CHLORHEXIDINE GLUCONATE CLOTH 2 % EX PADS
6.0000 | MEDICATED_PAD | Freq: Every day | CUTANEOUS | Status: DC
  Administered 2023-07-22 – 2023-08-03 (×13): 6 via TOPICAL

## 2023-07-22 MED ORDER — SODIUM CHLORIDE 0.9% IV SOLUTION: Freq: Once | INTRAVENOUS | Status: AC

## 2023-07-22 NOTE — Progress Notes (Signed)
Progress Note   Patient: Barry [name]:[id number] DOB: 09/24/1946 DOA: 07/21/2023     1 DOS: the patient was seen and examined on 07/22/2023   Brief hospital course:  Barry [name] is a 77 y.o. male with medical history significant for atrial fibrillation on anticoagulation, chronic anemia, bladder cancer under the care of urology Dr. Annabell Howells being admitted to the hospital with altered mental status likely due to catheter associated UTI.  Has been over last 3 to 4 days seems to be intermittently more more confused. No fever/ chills. Has foley catheter since July s/p TURBT. He is started on IV fluids, Rocephin therapy admitted for further management and evaluation.   Assessment and Plan: Catheter associated UTI- due to chronic Foley catheter from urinary retention.   Urine cultures positive for E.coli Continue IV Rocephin therapy.   History of bladder tumor- status post recent TURBT in July,  continue chronic Foley catheter due to retention.   Has repeat surgery scheduled with urology tomorrow. A unit of PRBC ordered for Hb less than 7 pre-op.  Acute on chronic anemia of blood loss- Will give a unit of blood pre- op Hb <8. Continue to monitor h/h, transfuse as needed.  Hyponatremia- chronic and stable, unlikely to be of clinical significance. Trend sodium.   Atrial fibrillation- continue Lopressor and Cardizem,  He is off Xarelto his last hospital stay due to persistent hematuria.   Hypertension-continue losartan   Chronic diarrhea-  continue Questran, likely would benefit from outpatient GI workup, C. difficile was apparently checked as an outpatient and negative, recheck is also negative, Will start Imodium prn Patient's POA asks for GI evaluation while inpatient, advised will consider after the urology procedure.          Subjective: Patient is seen and examined today morning, he is lying comfortably. Denies any complaints of abdominal pain, nausea or vomiting. Gave  consent for blood transfusion knows he is going for urologic procedure.  Physical Exam: Vitals:   07/22/23 0550 07/22/23 0754 07/22/23 0920 07/22/23 1030  BP: 123/62   108/68  Pulse: 78   93  Resp: 18   20  Temp: 98.3 F (36.8 C)   98.9 F (37.2 C)  TempSrc: Oral   Axillary  SpO2: 95% 93% 94% 94%  Weight:      Height:        Data Reviewed:     Latest Ref Rng & Units 07/22/2023    3:46 AM 07/21/2023   12:05 PM 07/02/2023    5:24 AM  CBC  WBC 4.0 - 10.5 K/uL 7.0  9.1  6.2   Hemoglobin 13.0 - 17.0 g/dL 7.8  9.6  8.2   Hematocrit 39.0 - 52.0 % 26.9  32.3  26.6   Platelets 150 - 400 K/uL 324  443  167        Latest Ref Rng & Units 07/22/2023    3:46 AM 07/21/2023   12:05 PM 07/02/2023    5:24 AM  BMP  Glucose 70 - 99 mg/dL 89  956  213   BUN 8 - 23 mg/dL 14  19  18    Creatinine 0.61 - 1.24 mg/dL 0.86  5.78  4.69   Sodium 135 - 145 mmol/L 131  131  132   Potassium 3.5 - 5.1 mmol/L 3.5  3.9  4.1   Chloride 98 - 111 mmol/L 104  101  101   CO2 22 - 32 mmol/L 18  18  25  Calcium 8.9 - 10.3 mg/dL 8.2  9.1  8.3     Family Communication: discussed with his POA over phone. She asked for GI eval prior to his discharge for chronic diarrhea.   Disposition: Status is: Inpatient Remains inpatient appropriate because: urologic procedure scheduled for tomorrow.  Planned Discharge Destination: Skilled nursing facility    Time spent: 44 minutes  Author: Marcelino Duster, MD 07/22/2023 1:12 PM  For on call review [email or web address].

## 2023-07-22 NOTE — H&P (View-Only) (Signed)
Subjective:    Consult requested by Dr. Marcelino Duster  07/22/23:  [name] was admitted yesterday for a possible catheter associated UTI with MS changes.  He has a large bladder tumor that was partially resected in July and is scheduled for the second stage on 07/23/23.   Today he reports some suprapubic discomfort but no flank pin.  His urine is clear amber.   His Cr. Is 0.56 with a solitary right kidney.  His WBC count is 7 with a Hgb of 7.8.     06/03/23: [name] is a 77yo male who was admitted today with dizziness, recurrent diarrhea and intermittently bloody stools.  He has been having some hematuria recently and has had progressive LUTS with frequency, urgency and some difficulty voiding.  He had a CT abdomen/pelvis that showed multiple bladder lesions consistent urothelial carcinoma with the largest close to 5cm in diameter.  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  There is an 8mm non-obstructing stone in the renal pelvis.   His UA has >50 WBC, >50 RBC's and budding yeast but only a few bacteria.   Apart from the nephrectomy he has no other GU history.  He has been on Eliquis for afib but that has been stopped.   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 Negative for chills and fever.  Respiratory:  Negative for shortness of breath and stridor.   Cardiovascular:  Negative for chest pain.  Gastrointestinal:        Loss of appetite.   Genitourinary:  Negative for flank pain.  Musculoskeletal:  Negative for joint pain (both knees.).  Neurological:  Positive for focal  weakness.  All other systems reviewed and are negative.   Allergies  Allergen Reactions   Piroxicam Itching and Rash    Past Medical History:  Diagnosis Date   A-fib (HCC)    Acute sinusitis 03/24/2012   Followed in Pulmonary clinic/ [facility]/ [name] - CT sinus 08/07/2013  Short air-fluid levels in the maxillary sinuses bilaterally suggesting early acute sinusitis. - repeat ct sinus 04/30/2014 > ok     Acute sinusitis, unspecified 08/14/2010   Qualifier: Diagnosis of  By: Clent Ridges NP, [name]     Anemia    Asthma    PFTs 06/15/05 FEV1 85% predicted ratio 68% and truncation of resp loop in a sawtooth pattern. HFA 25% 08-01-2009 >50% Nov 01, 2009>50% 01-31-2010   Asthma, chronic 11/24/2007   Followed in Pulmonary clinic/ [facility]/ [name]  - PFTs 06/15/2005 FEV1 85% predicted ratio 68% and truncation of respiratory loop in a sawtooth pattern  - HFA 25% August 01, 2009 > 50% November 01, 2009 > 50% January 31, 2010 therefore changed to neb bud/brovana - HFA 75% p extensive coaching 08/07/2013  > insurance issues so try symbicort 160 2 bid instead of neb - 11/16/2015   p extensive   Bladder mass    BRONCHITIS, ACUTE 01/22/2011   Qualifier: Diagnosis of  By: Clent Ridges NP, [name]     CAD (coronary artery disease), native coronary artery 02/06/2019   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   Cataract    Chronic cough  Chronic rhinitis 05/02/2009   Followed in Pulmonary clinic/ [facility]/ [name]    - 03/24/12 CT Sinus > Negative paranasal sinuses - Sinus CT 08/07/2013 >>Short air-fluid levels in the maxillary sinuses bilaterally suggesting early acute sinusitis - Repeat augmentin x 21 days 03/27/2014 then sinus ct if not better  - flonase/afrin regimen 02/11/15 >>>      Coronary heart disease    COUGH, CHRONIC 11/24/2007   Followed in Pulmonary clinic/ [facility]/ [name]      DDD (degenerative disc disease), lumbar    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    Followed in Pulmonary clinic/ [facility]/ [name]  -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     Excessive ear wax, bilateral 09/15/2018   GASTROESOPHAGEAL REFLUX DISEASE 08/27/2008   Followed as Primary Care Patient/ GI/ Dr  Danise Edge     Glaucoma    Hyperlipidemia LDL goal <70 02/06/2019   Hypertension    Obesity    Osteoarthritis    Paralyzed hemidiaphragm    left, 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   Retina disorder 2014   Severe obesity (BMI >= 40) (HCC) 11/24/2007       - Target wt < 191 to get under BMI 30    Ulcerative colitis    ULCERATIVE COLITIS 11/24/2007   Followed as Primary Care Patient/ GI/ Dr  Danise Edge     URI 04/09/2008   Qualifier: Diagnosis of  By: Clent Ridges NP, [name]      Past Surgical History:  Procedure Laterality Date   CARDIAC CATHETERIZATION Left    COLONOSCOPY WITH PROPOFOL N/A 11/29/2015   Procedure: COLONOSCOPY WITH PROPOFOL;  Surgeon: Charolett Bumpers, MD;  Location: [facility];  Service: Endoscopy;  Laterality: N/A;   CORONARY ARTERY BYPASS GRAFT     x6   TRANSURETHRAL RESECTION OF BLADDER TUMOR N/A 06/28/2023   Procedure: CYSTOSCOPY TRANSURETHRAL RESECTION OF BLADDER TUMOR (TURBT);  Surgeon: Bjorn Pippin, MD;  Location: [facility];  Service: Urology;  Laterality: N/A;    Social History   Socioeconomic History   Marital status: Single    Spouse name: Not on file   Number of children: 0   Years of education: Not on file   Highest education level: Not on file  Occupational History   Occupation: disabled, prev worked in Designer, fashion/clothing  Tobacco Use   Smoking status: Never   Smokeless tobacco: Never  Substance and Sexual Activity   Alcohol use: No   Drug use: No   Sexual activity: Not on file  Other Topics Concern   Not on file  Social History Narrative   ** Merged History Encounter **       Social Determinants of Health   Financial Resource Strain: Not on file  Food Insecurity: No Food Insecurity (07/21/2023)   Hunger Vital Sign    Worried  About Running Out of Food in the Last Year: Never true    Ran Out of Food in the Last Year: Never true  Transportation Needs: No Transportation Needs (07/21/2023)   PRAPARE - Administrator, Civil Service (Medical): No    Lack of Transportation (Non-Medical): No  Physical Activity: Not on file  Stress: Not on file  Social Connections: Not on file  Intimate Partner Violence: Not At Risk (07/21/2023)   Humiliation, Afraid, Rape, and Kick questionnaire    Fear of Current or Ex-Partner: No    Emotionally Abused: No    Physically Abused: No    Sexually Abused: No    Family History  Problem Relation Age of Onset   Heart disease Father    Hypertension Mother    Renal Disease Mother    Pulmonary Hypertension Mother    Other Sister        UNKNOWN HISTORY    Anti-infectives: Anti-infectives (From admission, onward)    Start     Dose/Rate Route Frequency Ordered Stop   07/22/23 1200  cefTRIAXone (ROCEPHIN) 1 g in sodium chloride 0.9 % 100 mL IVPB        1 g 200 mL/hr over 30 Minutes Intravenous Every 24 hours 07/21/23 1447     07/21/23 1200  cefTRIAXone (ROCEPHIN) 2 g in sodium chloride 0.9 % 100 mL IVPB  Status:  Discontinued        2 g 200 mL/hr over 30 Minutes Intravenous Every 24 hours 07/21/23 1155 07/21/23 1447       Current Facility-Administered Medications  Medication Dose Route Frequency Provider Last Rate Last Admin   acetaminophen (TYLENOL) tablet 650 mg  650 mg Oral Q6H PRN Kirby Crigler, Mir M, MD       Or   acetaminophen (TYLENOL) suppository 650 mg  650 mg Rectal Q6H PRN Kirby Crigler, Mir M, MD       albuterol (PROVENTIL) (2.5 MG/3ML) 0.083% nebulizer solution 2.5 mg  2.5 mg Nebulization Q2H PRN Kirby Crigler, Mir M, MD       atorvastatin (LIPITOR) tablet 40 mg  40 mg Oral Daily Kirby Crigler, Mir M, MD   40 mg at 07/21/23 1633   cefTRIAXone (ROCEPHIN) 1 g in sodium chloride 0.9 % 100 mL IVPB  1 g Intravenous Q24H Kirby Crigler, Mir M, MD       cholestyramine light  (PREVALITE) packet 4 g  4 g Oral BID Kirby Crigler, Mir M, MD   4 g at 07/21/23 2203   cycloSPORINE (RESTASIS) 0.05 % ophthalmic emulsion 1 drop  1 drop Both Eyes BID Kirby Crigler, Mir M, MD   1 drop at 07/21/23 2200   diltiazem (CARDIZEM CD) 24 hr capsule 240 mg  240 mg Oral Daily Kirby Crigler, Mir M, MD   240 mg at 07/21/23 1809   enoxaparin (LOVENOX) injection 40 mg  40 mg Subcutaneous Q24H Kirby Crigler, Mir M, MD   40 mg at 07/21/23 2159   escitalopram (LEXAPRO) tablet 20 mg  20 mg Oral Daily Kirby Crigler, Mir M, MD   20 mg at 07/21/23 1810   ezetimibe (ZETIA) tablet 10 mg  10 mg Oral Daily Kirby Crigler, Mir M, MD   10 mg at 07/21/23 1810   feeding supplement (ENSURE ENLIVE / ENSURE PLUS) liquid 237 mL  237 mL Oral BID BM Kirby Crigler, Mir M, MD       ferrous sulfate tablet 325 mg  325 mg Oral Q breakfast Kirby Crigler, Mir M, MD       Gerhardt's butt cream   Topical TID Kirby Crigler, Mir M, MD   1 Application at 07/21/23 2203   latanoprost (XALATAN) 0.005 % ophthalmic solution 1 drop  1 drop Both Eyes QHS Kirby Crigler, Mir M, MD   1 drop at 07/21/23 2203   losartan (COZAAR) tablet 25 mg  25 mg Oral Daily Kirby Crigler, Mir M, MD   25 mg at 07/21/23 1632   metoprolol tartrate (LOPRESSOR) tablet 50 mg  50 mg Oral BID Kirby Crigler, Mir M, MD   50 mg at 07/21/23 2159   mometasone-formoterol (DULERA) 200-5 MCG/ACT inhaler 2 puff  2 puff Inhalation BID Maryln Gottron, MD   2 puff at 07/22/23 0754   montelukast (SINGULAIR)  tablet 10 mg  10 mg Oral QHS Kirby Crigler, Mir M, MD   10 mg at 07/21/23 2203   mupirocin ointment (BACTROBAN) 2 % 1 Application  1 Application Nasal BID Maryln Gottron, MD   1 Application at 07/21/23 2203   Oral care mouth rinse  15 mL Mouth Rinse PRN Kirby Crigler, Mir M, MD       pantoprazole (PROTONIX) EC tablet 40 mg  40 mg Oral Daily Kirby Crigler, Mir M, MD   40 mg at 07/21/23 1632     Objective: Vital signs in last 24 hours: BP 123/62 (BP Location: Right Arm)   Pulse 78   Temp 98.3 F (36.8 C)  (Oral)   Resp 18   Ht 5\' 3"  (1.6 m)   Wt 84.6 kg   SpO2 93%   BMI 33.04 kg/m   Intake/Output from previous day: 08/04 0701 - 08/05 0700 In: 1896.3 [P.O.:270; I.V.:626.3; IV Piggyback:1000] Out: 975 [Urine:975] Intake/Output this shift: No intake/output data recorded.   Physical Exam Vitals reviewed.  Constitutional:      Appearance: Normal appearance.  Cardiovascular:     Rate and Rhythm: Normal rate. Rhythm irregular.  Pulmonary:     Effort: Pulmonary effort is normal. No respiratory distress.  Abdominal:     Tenderness: Tenderness: suprapubic.  Genitourinary:    Comments: Foley in place.  Urine is amber.  Musculoskeletal:        General: No swelling. Normal range of motion.  Skin:    General: Skin is warm and dry.  Neurological:     Mental Status: He is alert.     Motor: Weakness (left side) present.     Comments: He is alert and oriented.   Psychiatric:        Mood and Affect: Mood normal.        Behavior: Behavior normal.     Lab Results:  Results for orders placed or performed during the hospital encounter of 07/21/23 (from the past 24 hour(s))  Comprehensive metabolic panel     Status: Abnormal   Collection Time: 07/21/23 12:05 PM  Result Value Ref Range   Sodium 131 (L) 135 - 145 mmol/L   Potassium 3.9 3.5 - 5.1 mmol/L   Chloride 101 98 - 111 mmol/L   CO2 18 (L) 22 - 32 mmol/L   Glucose, Bld 112 (H) 70 - 99 mg/dL   BUN 19 8 - 23 mg/dL   Creatinine, Ser 5.62 0.61 - 1.24 mg/dL   Calcium 9.1 8.9 - 13.0 mg/dL   Total Protein 6.5 6.5 - 8.1 g/dL   Albumin 2.6 (L) 3.5 - 5.0 g/dL   AST 38 15 - 41 U/L   ALT 30 0 - 44 U/L   Alkaline Phosphatase 105 38 - 126 U/L   Total Bilirubin 0.5 0.3 - 1.2 mg/dL   GFR, Estimated >86 >57 mL/min   Anion gap 12 5 - 15  CBC with Differential     Status: Abnormal   Collection Time: 07/21/23 12:05 PM  Result Value Ref Range   WBC 9.1 4.0 - 10.5 K/uL   RBC 3.50 (L) 4.22 - 5.81 MIL/uL   Hemoglobin 9.6 (L) 13.0 - 17.0 g/dL    HCT 84.6 (L) 96.2 - 52.0 %   MCV 92.3 80.0 - 100.0 fL   MCH 27.4 26.0 - 34.0 pg   MCHC 29.7 (L) 30.0 - 36.0 g/dL   RDW 95.2 (H) 84.1 - 32.4 %   Platelets 443 (H) 150 - 400 K/uL  nRBC 0.3 (H) 0.0 - 0.2 %   Neutrophils Relative % 74 %   Neutro Abs 6.7 1.7 - 7.7 K/uL   Lymphocytes Relative 13 %   Lymphs Abs 1.2 0.7 - 4.0 K/uL   Monocytes Relative 10 %   Monocytes Absolute 0.9 0.1 - 1.0 K/uL   Eosinophils Relative 1 %   Eosinophils Absolute 0.1 0.0 - 0.5 K/uL   Basophils Relative 1 %   Basophils Absolute 0.1 0.0 - 0.1 K/uL   Immature Granulocytes 1 %   Abs Immature Granulocytes 0.12 (H) 0.00 - 0.07 K/uL  Protime-INR     Status: None   Collection Time: 07/21/23 12:05 PM  Result Value Ref Range   Prothrombin Time 14.2 11.4 - 15.2 seconds   INR 1.1 0.8 - 1.2  APTT     Status: None   Collection Time: 07/21/23 12:05 PM  Result Value Ref Range   aPTT 26 24 - 36 seconds  I-Stat Lactic Acid, ED     Status: None   Collection Time: 07/21/23 12:25 PM  Result Value Ref Range   Lactic Acid, Venous 1.9 0.5 - 1.9 mmol/L  Urinalysis, w/ Reflex to Culture (Infection Suspected) -Urine, Clean Catch     Status: Abnormal   Collection Time: 07/21/23 12:35 PM  Result Value Ref Range   Specimen Source URINE, CLEAN CATCH    Color, Urine AMBER (A) YELLOW   APPearance TURBID (A) CLEAR   Specific Gravity, Urine 1.017 1.005 - 1.030   pH 5.0 5.0 - 8.0   Glucose, UA NEGATIVE NEGATIVE mg/dL   Hgb urine dipstick MODERATE (A) NEGATIVE   Bilirubin Urine NEGATIVE NEGATIVE   Ketones, ur NEGATIVE NEGATIVE mg/dL   Protein, ur 409 (A) NEGATIVE mg/dL   Nitrite NEGATIVE NEGATIVE   Leukocytes,Ua LARGE (A) NEGATIVE   RBC / HPF >50 0 - 5 RBC/hpf   WBC, UA >50 0 - 5 WBC/hpf   Bacteria, UA MANY (A) NONE SEEN   Squamous Epithelial / HPF 0-5 0 - 5 /HPF   WBC Clumps PRESENT    Mucus PRESENT   Resp panel by RT-PCR (RSV, Flu A&B, Covid) Anterior Nasal Swab     Status: None   Collection Time: 07/21/23  2:14 PM    Specimen: Anterior Nasal Swab  Result Value Ref Range   SARS Coronavirus 2 by RT PCR NEGATIVE NEGATIVE   Influenza A by PCR NEGATIVE NEGATIVE   Influenza B by PCR NEGATIVE NEGATIVE   Resp Syncytial Virus by PCR NEGATIVE NEGATIVE  I-Stat Lactic Acid, ED     Status: None   Collection Time: 07/21/23  4:18 PM  Result Value Ref Range   Lactic Acid, Venous 1.5 0.5 - 1.9 mmol/L  Basic metabolic panel     Status: Abnormal   Collection Time: 07/22/23  3:46 AM  Result Value Ref Range   Sodium 131 (L) 135 - 145 mmol/L   Potassium 3.5 3.5 - 5.1 mmol/L   Chloride 104 98 - 111 mmol/L   CO2 18 (L) 22 - 32 mmol/L   Glucose, Bld 89 70 - 99 mg/dL   BUN 14 8 - 23 mg/dL   Creatinine, Ser 8.11 (L) 0.61 - 1.24 mg/dL   Calcium 8.2 (L) 8.9 - 10.3 mg/dL   GFR, Estimated >91 >47 mL/min   Anion gap 9 5 - 15  CBC     Status: Abnormal   Collection Time: 07/22/23  3:46 AM  Result Value Ref Range   WBC 7.0 4.0 -  10.5 K/uL   RBC 2.87 (L) 4.22 - 5.81 MIL/uL   Hemoglobin 7.8 (L) 13.0 - 17.0 g/dL   HCT 09.8 (L) 11.9 - 14.7 %   MCV 93.7 80.0 - 100.0 fL   MCH 27.2 26.0 - 34.0 pg   MCHC 29.0 (L) 30.0 - 36.0 g/dL   RDW 82.9 (H) 56.2 - 13.0 %   Platelets 324 150 - 400 K/uL   nRBC 0.0 0.0 - 0.2 %  C Difficile Quick Screen w PCR reflex     Status: None   Collection Time: 07/22/23  6:12 AM   Specimen: STOOL  Result Value Ref Range   C Diff antigen NEGATIVE NEGATIVE   C Diff toxin NEGATIVE NEGATIVE   C Diff interpretation No C. difficile detected.   Surgical PCR screen     Status: Abnormal   Collection Time: 07/22/23  6:12 AM   Specimen: STOOL; Nasal Swab  Result Value Ref Range   MRSA, PCR NEGATIVE NEGATIVE   Staphylococcus aureus POSITIVE (A) NEGATIVE    BMET Recent Labs    07/21/23 1205 07/22/23 0346  NA 131* 131*  K 3.9 3.5  CL 101 104  CO2 18* 18*  GLUCOSE 112* 89  BUN 19 14  CREATININE 0.81 0.56*  CALCIUM 9.1 8.2*   PT/INR Recent Labs    07/21/23 1205  LABPROT 14.2  INR 1.1   ABG No  results for input(s): "PHART", "HCO3" in the last 72 hours.  Invalid input(s): "PCO2", "PO2"  Studies/Results: CT Head Wo Contrast  Result Date: 07/21/2023 CLINICAL DATA:  Mental status change, unknown cause EXAM: CT HEAD WITHOUT CONTRAST TECHNIQUE: Contiguous axial images were obtained from the base of the skull through the vertex without intravenous contrast. RADIATION DOSE REDUCTION: This exam was performed according to the departmental dose-optimization program which includes automated exposure control, adjustment of the mA and/or kV according to patient size and/or use of iterative reconstruction technique. COMPARISON:  None Available. FINDINGS: Brain: No evidence of acute infarction, hemorrhage, hydrocephalus, extra-axial collection or mass lesion/mass effect. Sequela of mild chronic microvascular ischemic change. Generalized volume loss. Vascular: No hyperdense vessel or unexpected calcification. Skull: Normal. Negative for fracture or focal lesion. Sinuses/Orbits: No middle ear or mastoid effusion. Paranasal sinuses are clear. Bilateral lens replacement. Orbits are otherwise unremarkable. Other: None. IMPRESSION: No acute intracranial abnormality. Electronically Signed   By: Lorenza [name] M.D.   On: 07/21/2023 13:31   DG Chest Port 1 View  Result Date: 07/21/2023 CLINICAL DATA:  Altered mental status, questionable sepsis. EXAM: PORTABLE CHEST 1 VIEW COMPARISON:  Chest radiograph dated 06/07/2021. FINDINGS: The heart size and mediastinal contours are within normal limits. The lung volumes are low. There is mild left basilar atelectasis/airspace disease. The right lung is clear. No large pleural effusion or pneumothorax is identified. The visualized skeletal structures are unremarkable. IMPRESSION: Low lung volumes with mild left basilar atelectasis/airspace disease. Electronically Signed   By: Romona Curls M.D.   On: 07/21/2023 13:15     Assessment/Plan: Multifocal bladder lesions consistent  with urothelial carcinoma.  He is on for second stage resection tomorrow.       Solitary right kidney with 8mm renal pelvic stone.  His Cr is normal.   Anemia.   He will be transfused today.   Hematuria.  Hold Lovenox.         No follow-ups on file.    CC: Dr. Richarda Overlie      Bjorn Pippin 07/22/2023

## 2023-07-22 NOTE — Progress Notes (Signed)
CCC Pre-op Review  Pre-op checklist: asked nurse to complete  NPO: yes  Labs: WNL  Consent: completed  H&P: needs to be completed  Vitals: WNL 07/22/23 at 1545  O2 requirements: RA 95%  MAR/PTA review: completed  IV: 20g RFA  Floor nurse name:  8/5-Dianna  Additional info: foley in place

## 2023-07-22 NOTE — TOC Initial Note (Signed)
Transition of Care [facility]) - Initial/Assessment Note    Patient Details  Name: Barry [name] MRN: [id number] Date of Birth: 12-02-1946  Transition of [facility]) CM/SW Contact:    Howell Rucks, RN Phone Number: 07/22/2023, 1:08 PM  Clinical Narrative:  [facility] consulted for short term rehab placement-SNF (pt admitted from [facility] short term rehab level of care. Met with pt and pt's friend Consuella Lose) at bedside to introduce role of TOC/NCM and review for dc planning. Per Consuella Lose, plan is for pt to return to [facility]. Consuella Lose expressed concern regarding cost of holding pt's current room. NCM outreached to [place], admissions coordinator at [facility], spoke with Consuella Lose on speaker phone, discussed with decision made not to hold the room due to daily cost and request for bed offer once pt more stable and  closer to dc. TOC will continue to follow.                Expected Discharge Plan: Skilled Nursing Facility Barriers to Discharge: Continued Medical Work up   Patient Goals and CMS Choice Patient states their goals for this hospitalization and ongoing recovery are:: return to [facility] for short term rehab CMS Medicare.gov Compare Post Acute Care list provided to:: Patient Represenative (must comment) Verner Mould (friend)) Choice offered to / list presented to :  Designer, television/film set (friend))      Expected Discharge Plan and Services       Living arrangements for the past 2 months: Apartment                                      Prior Living Arrangements/Services Living arrangements for the past 2 months: Apartment Lives with:: Self Patient language and need for interpreter reviewed:: Yes        Need for Family Participation in Patient Care: Yes (Comment) Care giver support system in place?: Yes (comment)   Criminal Activity/Legal Involvement Pertinent to Current Situation/Hospitalization: No - Comment as needed  Activities of Daily Living Home Assistive  Devices/Equipment: Eyeglasses, Wheelchair, Other (Comment) ADL Screening (condition at time of admission) Patient's cognitive ability adequate to safely complete daily activities?: No Is the patient deaf or have difficulty hearing?: No Does the patient have difficulty seeing, even when wearing glasses/contacts?: No Does the patient have difficulty concentrating, remembering, or making decisions?: Yes Patient able to express need for assistance with ADLs?: Yes Does the patient have difficulty dressing or bathing?: Yes Independently performs ADLs?: No Communication: Independent Dressing (OT): Dependent Is this a change from baseline?: Pre-admission baseline Grooming: Dependent Is this a change from baseline?: Pre-admission baseline Feeding: Needs assistance Is this a change from baseline?: Pre-admission baseline Bathing: Dependent Is this a change from baseline?: Pre-admission baseline Toileting: Dependent Is this a change from baseline?: Pre-admission baseline In/Out Bed: Dependent Is this a change from baseline?: Pre-admission baseline Walks in Home: Dependent Is this a change from baseline?: Pre-admission baseline Does the patient have difficulty walking or climbing stairs?: Yes Weakness of Legs: Both Weakness of Arms/Hands: Both  Permission Sought/Granted Permission sought to share information with : Case Manager Permission granted to share information with : Yes, Verbal Permission Granted  Share Information with NAME: Fannie Knee, RN           Emotional Assessment Appearance:: Appears stated age Attitude/Demeanor/Rapport: Gracious Affect (typically observed): Accepting Orientation: : Oriented to Self, Oriented to Place, Oriented to  Time, Oriented to Situation Alcohol /  Substance Use: Not Applicable Psych Involvement: No (comment)  Admission diagnosis:  Lower urinary tract infectious disease [N39.0] UTI (urinary tract infection) [N39.0] Patient Active Problem List    Diagnosis Date Noted   UTI (urinary tract infection) 07/21/2023   Malignant neoplasm of overlapping sites of bladder (HCC) 06/28/2023   Gross hematuria 06/28/2023   Class 2 obesity 06/28/2023   ABLA (acute blood loss anemia) 06/28/2023   Paroxysmal atrial fibrillation with RVR (HCC) 06/28/2023   Physical deconditioning 06/05/2023   Effusion of left knee 06/05/2023   Preoperative cardiovascular examination 06/04/2023   Hx of CABG 06/04/2023   Bladder mass 06/03/2023   Pressure injury of skin 06/03/2023   Lower GI bleed 06/03/2023   PAF (paroxysmal atrial fibrillation) (HCC) 01/11/2023   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 (HCC) 01/11/2023   Seborrheic dermatitis 09/02/2020   CAD (coronary artery disease), native coronary artery 02/06/2019   Benign essential HTN 02/06/2019   Hyperlipidemia LDL goal <70 02/06/2019   Excessive ear wax, bilateral 09/15/2018   Acute sinusitis 03/24/2012   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   Chronic rhinitis 05/02/2009   GASTROESOPHAGEAL REFLUX DISEASE 08/27/2008   Severe obesity (BMI >= 40) (HCC) 11/24/2007   Asthma, chronic 11/24/2007   ULCERATIVE COLITIS 11/24/2007   PCP:  Georgann Housekeeper, MD Pharmacy:  No Pharmacies Listed    Social Determinants of Health (SDOH) Social History: SDOH Screenings   Food Insecurity: No Food Insecurity (07/21/2023)  Housing: Low Risk  (07/21/2023)  Transportation Needs: No Transportation Needs (07/21/2023)  Utilities: Not At Risk (07/21/2023)  Tobacco Use: Low Risk  (07/21/2023)   SDOH Interventions:     Readmission Risk Interventions    07/22/2023    1:05 PM  Readmission Risk Prevention Plan  Transportation Screening Complete  PCP or Specialist Appt within 5-7 Days Complete  Home Care Screening Complete  Medication Review (RN CM) Complete

## 2023-07-22 NOTE — Plan of Care (Signed)

## 2023-07-22 NOTE — Consult Note (Signed)
Subjective:    Consult requested by Dr. Marcelino Duster  07/22/23:  Barry [name] was admitted yesterday for a possible catheter associated UTI with MS changes.  He has a large bladder tumor that was partially resected in July and is scheduled for the second stage on 07/23/23.   Today he reports some suprapubic discomfort but no flank pin.  His urine is clear amber.   His Cr. Is 0.56 with a solitary right kidney.  His WBC count is 7 with a Hgb of 7.8.     06/03/23: Barry [name] is a 77yo male who was admitted today with dizziness, recurrent diarrhea and intermittently bloody stools.  He has been having some hematuria recently and has had progressive LUTS with frequency, urgency and some difficulty voiding.  He had a CT abdomen/pelvis that showed multiple bladder lesions consistent urothelial carcinoma with the largest close to 5cm in diameter.  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  There is an 8mm non-obstructing stone in the renal pelvis.   His UA has >50 WBC, >50 RBC's and budding yeast but only a few bacteria.   Apart from the nephrectomy he has no other GU history.  He has been on Eliquis for afib but that has been stopped.   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 Negative for chills and fever.  Respiratory:  Negative for shortness of breath and stridor.   Cardiovascular:  Negative for chest pain.  Gastrointestinal:        Loss of appetite.   Genitourinary:  Negative for flank pain.  Musculoskeletal:  Negative for joint pain (both knees.).  Neurological:  Positive for focal  weakness.  All other systems reviewed and are negative.   Allergies  Allergen Reactions   Piroxicam Itching and Rash    Past Medical History:  Diagnosis Date   A-fib (HCC)    Acute sinusitis 03/24/2012   Followed in Pulmonary clinic/ [facility]/ [name] - CT sinus 08/07/2013  Short air-fluid levels in the maxillary sinuses bilaterally suggesting early acute sinusitis. - repeat ct sinus 04/30/2014 > ok     Acute sinusitis, unspecified 08/14/2010   Qualifier: Diagnosis of  By: Clent Ridges NP, [name]     Anemia    Asthma    PFTs 06/15/05 FEV1 85% predicted ratio 68% and truncation of resp loop in a sawtooth pattern. HFA 25% 08-01-2009 >50% Nov 01, 2009>50% 01-31-2010   Asthma, chronic 11/24/2007   Followed in Pulmonary clinic/ [facility]/ [name]  - PFTs 06/15/2005 FEV1 85% predicted ratio 68% and truncation of respiratory loop in a sawtooth pattern  - HFA 25% August 01, 2009 > 50% November 01, 2009 > 50% January 31, 2010 therefore changed to neb bud/brovana - HFA 75% p extensive coaching 08/07/2013  > insurance issues so try symbicort 160 2 bid instead of neb - 11/16/2015   p extensive   Bladder mass    BRONCHITIS, ACUTE 01/22/2011   Qualifier: Diagnosis of  By: Clent Ridges NP, [name]     CAD (coronary artery disease), native coronary artery 02/06/2019   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   Cataract    Chronic cough  Chronic rhinitis 05/02/2009   Followed in Pulmonary clinic/ [facility]/ [name]    - 03/24/12 CT Sinus > Negative paranasal sinuses - Sinus CT 08/07/2013 >>Short air-fluid levels in the maxillary sinuses bilaterally suggesting early acute sinusitis - Repeat augmentin x 21 days 03/27/2014 then sinus ct if not better  - flonase/afrin regimen 02/11/15 >>>      Coronary heart disease    COUGH, CHRONIC 11/24/2007   Followed in Pulmonary clinic/ [facility]/ [name]      DDD (degenerative disc disease), lumbar    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    Followed in Pulmonary clinic/ [facility]/ [name]  -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     Excessive ear wax, bilateral 09/15/2018   GASTROESOPHAGEAL REFLUX DISEASE 08/27/2008   Followed as Primary Care Patient/ GI/ Dr  Danise Edge     Glaucoma    Hyperlipidemia LDL goal <70 02/06/2019   Hypertension    Obesity    Osteoarthritis    Paralyzed hemidiaphragm    left, 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   Retina disorder 2014   Severe obesity (BMI >= 40) (HCC) 11/24/2007       - Target wt < 191 to get under BMI 30    Ulcerative colitis    ULCERATIVE COLITIS 11/24/2007   Followed as Primary Care Patient/ GI/ Dr  Danise Edge     URI 04/09/2008   Qualifier: Diagnosis of  By: Clent Ridges NP, [name]      Past Surgical History:  Procedure Laterality Date   CARDIAC CATHETERIZATION Left    COLONOSCOPY WITH PROPOFOL N/A 11/29/2015   Procedure: COLONOSCOPY WITH PROPOFOL;  Surgeon: Charolett Bumpers, MD;  Location: [facility];  Service: Endoscopy;  Laterality: N/A;   CORONARY ARTERY BYPASS GRAFT     x6   TRANSURETHRAL RESECTION OF BLADDER TUMOR N/A 06/28/2023   Procedure: CYSTOSCOPY TRANSURETHRAL RESECTION OF BLADDER TUMOR (TURBT);  Surgeon: Bjorn Pippin, MD;  Location: [facility];  Service: Urology;  Laterality: N/A;    Social History   Socioeconomic History   Marital status: Single    Spouse name: Not on file   Number of children: 0   Years of education: Not on file   Highest education level: Not on file  Occupational History   Occupation: disabled, prev worked in Designer, fashion/clothing  Tobacco Use   Smoking status: Never   Smokeless tobacco: Never  Substance and Sexual Activity   Alcohol use: No   Drug use: No   Sexual activity: Not on file  Other Topics Concern   Not on file  Social History Narrative   ** Merged History Encounter **       Social Determinants of Health   Financial Resource Strain: Not on file  Food Insecurity: No Food Insecurity (07/21/2023)   Hunger Vital Sign    Worried  About Running Out of Food in the Last Year: Never true    Ran Out of Food in the Last Year: Never true  Transportation Needs: No Transportation Needs (07/21/2023)   PRAPARE - Administrator, Civil Service (Medical): No    Lack of Transportation (Non-Medical): No  Physical Activity: Not on file  Stress: Not on file  Social Connections: Not on file  Intimate Partner Violence: Not At Risk (07/21/2023)   Humiliation, Afraid, Rape, and Kick questionnaire    Fear of Current or Ex-Partner: No    Emotionally Abused: No    Physically Abused: No    Sexually Abused: No    Family History  Problem Relation Age of Onset   Heart disease Father    Hypertension Mother    Renal Disease Mother    Pulmonary Hypertension Mother    Other Sister        UNKNOWN HISTORY    Anti-infectives: Anti-infectives (From admission, onward)    Start     Dose/Rate Route Frequency Ordered Stop   07/22/23 1200  cefTRIAXone (ROCEPHIN) 1 g in sodium chloride 0.9 % 100 mL IVPB        1 g 200 mL/hr over 30 Minutes Intravenous Every 24 hours 07/21/23 1447     07/21/23 1200  cefTRIAXone (ROCEPHIN) 2 g in sodium chloride 0.9 % 100 mL IVPB  Status:  Discontinued        2 g 200 mL/hr over 30 Minutes Intravenous Every 24 hours 07/21/23 1155 07/21/23 1447       Current Facility-Administered Medications  Medication Dose Route Frequency Provider Last Rate Last Admin   acetaminophen (TYLENOL) tablet 650 mg  650 mg Oral Q6H PRN Kirby Crigler, Mir M, MD       Or   acetaminophen (TYLENOL) suppository 650 mg  650 mg Rectal Q6H PRN Kirby Crigler, Mir M, MD       albuterol (PROVENTIL) (2.5 MG/3ML) 0.083% nebulizer solution 2.5 mg  2.5 mg Nebulization Q2H PRN Kirby Crigler, Mir M, MD       atorvastatin (LIPITOR) tablet 40 mg  40 mg Oral Daily Kirby Crigler, Mir M, MD   40 mg at 07/21/23 1633   cefTRIAXone (ROCEPHIN) 1 g in sodium chloride 0.9 % 100 mL IVPB  1 g Intravenous Q24H Kirby Crigler, Mir M, MD       cholestyramine light  (PREVALITE) packet 4 g  4 g Oral BID Kirby Crigler, Mir M, MD   4 g at 07/21/23 2203   cycloSPORINE (RESTASIS) 0.05 % ophthalmic emulsion 1 drop  1 drop Both Eyes BID Kirby Crigler, Mir M, MD   1 drop at 07/21/23 2200   diltiazem (CARDIZEM CD) 24 hr capsule 240 mg  240 mg Oral Daily Kirby Crigler, Mir M, MD   240 mg at 07/21/23 1809   enoxaparin (LOVENOX) injection 40 mg  40 mg Subcutaneous Q24H Kirby Crigler, Mir M, MD   40 mg at 07/21/23 2159   escitalopram (LEXAPRO) tablet 20 mg  20 mg Oral Daily Kirby Crigler, Mir M, MD   20 mg at 07/21/23 1810   ezetimibe (ZETIA) tablet 10 mg  10 mg Oral Daily Kirby Crigler, Mir M, MD   10 mg at 07/21/23 1810   feeding supplement (ENSURE ENLIVE / ENSURE PLUS) liquid 237 mL  237 mL Oral BID BM Kirby Crigler, Mir M, MD       ferrous sulfate tablet 325 mg  325 mg Oral Q breakfast Kirby Crigler, Mir M, MD       Gerhardt's butt cream   Topical TID Kirby Crigler, Mir M, MD   1 Application at 07/21/23 2203   latanoprost (XALATAN) 0.005 % ophthalmic solution 1 drop  1 drop Both Eyes QHS Kirby Crigler, Mir M, MD   1 drop at 07/21/23 2203   losartan (COZAAR) tablet 25 mg  25 mg Oral Daily Kirby Crigler, Mir M, MD   25 mg at 07/21/23 1632   metoprolol tartrate (LOPRESSOR) tablet 50 mg  50 mg Oral BID Kirby Crigler, Mir M, MD   50 mg at 07/21/23 2159   mometasone-formoterol (DULERA) 200-5 MCG/ACT inhaler 2 puff  2 puff Inhalation BID Maryln Gottron, MD   2 puff at 07/22/23 0754   montelukast (SINGULAIR)  tablet 10 mg  10 mg Oral QHS Kirby Crigler, Mir M, MD   10 mg at 07/21/23 2203   mupirocin ointment (BACTROBAN) 2 % 1 Application  1 Application Nasal BID Maryln Gottron, MD   1 Application at 07/21/23 2203   Oral care mouth rinse  15 mL Mouth Rinse PRN Kirby Crigler, Mir M, MD       pantoprazole (PROTONIX) EC tablet 40 mg  40 mg Oral Daily Kirby Crigler, Mir M, MD   40 mg at 07/21/23 1632     Objective: Vital signs in last 24 hours: BP 123/62 (BP Location: Right Arm)   Pulse 78   Temp 98.3 F (36.8 C)  (Oral)   Resp 18   Ht 5\' 3"  (1.6 m)   Wt 84.6 kg   SpO2 93%   BMI 33.04 kg/m   Intake/Output from previous day: 08/04 0701 - 08/05 0700 In: 1896.3 [P.O.:270; I.V.:626.3; IV Piggyback:1000] Out: 975 [Urine:975] Intake/Output this shift: No intake/output data recorded.   Physical Exam Vitals reviewed.  Constitutional:      Appearance: Normal appearance.  Cardiovascular:     Rate and Rhythm: Normal rate. Rhythm irregular.  Pulmonary:     Effort: Pulmonary effort is normal. No respiratory distress.  Abdominal:     Tenderness: Tenderness: suprapubic.  Genitourinary:    Comments: Foley in place.  Urine is amber.  Musculoskeletal:        General: No swelling. Normal range of motion.  Skin:    General: Skin is warm and dry.  Neurological:     Mental Status: He is alert.     Motor: Weakness (left side) present.     Comments: He is alert and oriented.   Psychiatric:        Mood and Affect: Mood normal.        Behavior: Behavior normal.     Lab Results:  Results for orders placed or performed during the hospital encounter of 07/21/23 (from the past 24 hour(s))  Comprehensive metabolic panel     Status: Abnormal   Collection Time: 07/21/23 12:05 PM  Result Value Ref Range   Sodium 131 (L) 135 - 145 mmol/L   Potassium 3.9 3.5 - 5.1 mmol/L   Chloride 101 98 - 111 mmol/L   CO2 18 (L) 22 - 32 mmol/L   Glucose, Bld 112 (H) 70 - 99 mg/dL   BUN 19 8 - 23 mg/dL   Creatinine, Ser 5.62 0.61 - 1.24 mg/dL   Calcium 9.1 8.9 - 13.0 mg/dL   Total Protein 6.5 6.5 - 8.1 g/dL   Albumin 2.6 (L) 3.5 - 5.0 g/dL   AST 38 15 - 41 U/L   ALT 30 0 - 44 U/L   Alkaline Phosphatase 105 38 - 126 U/L   Total Bilirubin 0.5 0.3 - 1.2 mg/dL   GFR, Estimated >86 >57 mL/min   Anion gap 12 5 - 15  CBC with Differential     Status: Abnormal   Collection Time: 07/21/23 12:05 PM  Result Value Ref Range   WBC 9.1 4.0 - 10.5 K/uL   RBC 3.50 (L) 4.22 - 5.81 MIL/uL   Hemoglobin 9.6 (L) 13.0 - 17.0 g/dL    HCT 84.6 (L) 96.2 - 52.0 %   MCV 92.3 80.0 - 100.0 fL   MCH 27.4 26.0 - 34.0 pg   MCHC 29.7 (L) 30.0 - 36.0 g/dL   RDW 95.2 (H) 84.1 - 32.4 %   Platelets 443 (H) 150 - 400 K/uL  nRBC 0.3 (H) 0.0 - 0.2 %   Neutrophils Relative % 74 %   Neutro Abs 6.7 1.7 - 7.7 K/uL   Lymphocytes Relative 13 %   Lymphs Abs 1.2 0.7 - 4.0 K/uL   Monocytes Relative 10 %   Monocytes Absolute 0.9 0.1 - 1.0 K/uL   Eosinophils Relative 1 %   Eosinophils Absolute 0.1 0.0 - 0.5 K/uL   Basophils Relative 1 %   Basophils Absolute 0.1 0.0 - 0.1 K/uL   Immature Granulocytes 1 %   Abs Immature Granulocytes 0.12 (H) 0.00 - 0.07 K/uL  Protime-INR     Status: None   Collection Time: 07/21/23 12:05 PM  Result Value Ref Range   Prothrombin Time 14.2 11.4 - 15.2 seconds   INR 1.1 0.8 - 1.2  APTT     Status: None   Collection Time: 07/21/23 12:05 PM  Result Value Ref Range   aPTT 26 24 - 36 seconds  I-Stat Lactic Acid, ED     Status: None   Collection Time: 07/21/23 12:25 PM  Result Value Ref Range   Lactic Acid, Venous 1.9 0.5 - 1.9 mmol/L  Urinalysis, w/ Reflex to Culture (Infection Suspected) -Urine, Clean Catch     Status: Abnormal   Collection Time: 07/21/23 12:35 PM  Result Value Ref Range   Specimen Source URINE, CLEAN CATCH    Color, Urine AMBER (A) YELLOW   APPearance TURBID (A) CLEAR   Specific Gravity, Urine 1.017 1.005 - 1.030   pH 5.0 5.0 - 8.0   Glucose, UA NEGATIVE NEGATIVE mg/dL   Hgb urine dipstick MODERATE (A) NEGATIVE   Bilirubin Urine NEGATIVE NEGATIVE   Ketones, ur NEGATIVE NEGATIVE mg/dL   Protein, ur 409 (A) NEGATIVE mg/dL   Nitrite NEGATIVE NEGATIVE   Leukocytes,Ua LARGE (A) NEGATIVE   RBC / HPF >50 0 - 5 RBC/hpf   WBC, UA >50 0 - 5 WBC/hpf   Bacteria, UA MANY (A) NONE SEEN   Squamous Epithelial / HPF 0-5 0 - 5 /HPF   WBC Clumps PRESENT    Mucus PRESENT   Resp panel by RT-PCR (RSV, Flu A&B, Covid) Anterior Nasal Swab     Status: None   Collection Time: 07/21/23  2:14 PM    Specimen: Anterior Nasal Swab  Result Value Ref Range   SARS Coronavirus 2 by RT PCR NEGATIVE NEGATIVE   Influenza A by PCR NEGATIVE NEGATIVE   Influenza B by PCR NEGATIVE NEGATIVE   Resp Syncytial Virus by PCR NEGATIVE NEGATIVE  I-Stat Lactic Acid, ED     Status: None   Collection Time: 07/21/23  4:18 PM  Result Value Ref Range   Lactic Acid, Venous 1.5 0.5 - 1.9 mmol/L  Basic metabolic panel     Status: Abnormal   Collection Time: 07/22/23  3:46 AM  Result Value Ref Range   Sodium 131 (L) 135 - 145 mmol/L   Potassium 3.5 3.5 - 5.1 mmol/L   Chloride 104 98 - 111 mmol/L   CO2 18 (L) 22 - 32 mmol/L   Glucose, Bld 89 70 - 99 mg/dL   BUN 14 8 - 23 mg/dL   Creatinine, Ser 8.11 (L) 0.61 - 1.24 mg/dL   Calcium 8.2 (L) 8.9 - 10.3 mg/dL   GFR, Estimated >91 >47 mL/min   Anion gap 9 5 - 15  CBC     Status: Abnormal   Collection Time: 07/22/23  3:46 AM  Result Value Ref Range   WBC 7.0 4.0 -  10.5 K/uL   RBC 2.87 (L) 4.22 - 5.81 MIL/uL   Hemoglobin 7.8 (L) 13.0 - 17.0 g/dL   HCT 09.8 (L) 11.9 - 14.7 %   MCV 93.7 80.0 - 100.0 fL   MCH 27.2 26.0 - 34.0 pg   MCHC 29.0 (L) 30.0 - 36.0 g/dL   RDW 82.9 (H) 56.2 - 13.0 %   Platelets 324 150 - 400 K/uL   nRBC 0.0 0.0 - 0.2 %  C Difficile Quick Screen w PCR reflex     Status: None   Collection Time: 07/22/23  6:12 AM   Specimen: STOOL  Result Value Ref Range   C Diff antigen NEGATIVE NEGATIVE   C Diff toxin NEGATIVE NEGATIVE   C Diff interpretation No C. difficile detected.   Surgical PCR screen     Status: Abnormal   Collection Time: 07/22/23  6:12 AM   Specimen: STOOL; Nasal Swab  Result Value Ref Range   MRSA, PCR NEGATIVE NEGATIVE   Staphylococcus aureus POSITIVE (A) NEGATIVE    BMET Recent Labs    07/21/23 1205 07/22/23 0346  NA 131* 131*  K 3.9 3.5  CL 101 104  CO2 18* 18*  GLUCOSE 112* 89  BUN 19 14  CREATININE 0.81 0.56*  CALCIUM 9.1 8.2*   PT/INR Recent Labs    07/21/23 1205  LABPROT 14.2  INR 1.1   ABG No  results for input(s): "PHART", "HCO3" in the last 72 hours.  Invalid input(s): "PCO2", "PO2"  Studies/Results: CT Head Wo Contrast  Result Date: 07/21/2023 CLINICAL DATA:  Mental status change, unknown cause EXAM: CT HEAD WITHOUT CONTRAST TECHNIQUE: Contiguous axial images were obtained from the base of the skull through the vertex without intravenous contrast. RADIATION DOSE REDUCTION: This exam was performed according to the departmental dose-optimization program which includes automated exposure control, adjustment of the mA and/or kV according to patient size and/or use of iterative reconstruction technique. COMPARISON:  None Available. FINDINGS: Brain: No evidence of acute infarction, hemorrhage, hydrocephalus, extra-axial collection or mass lesion/mass effect. Sequela of mild chronic microvascular ischemic change. Generalized volume loss. Vascular: No hyperdense vessel or unexpected calcification. Skull: Normal. Negative for fracture or focal lesion. Sinuses/Orbits: No middle ear or mastoid effusion. Paranasal sinuses are clear. Bilateral lens replacement. Orbits are otherwise unremarkable. Other: None. IMPRESSION: No acute intracranial abnormality. Electronically Signed   By: Lorenza [name] M.D.   On: 07/21/2023 13:31   DG Chest Port 1 View  Result Date: 07/21/2023 CLINICAL DATA:  Altered mental status, questionable sepsis. EXAM: PORTABLE CHEST 1 VIEW COMPARISON:  Chest radiograph dated 06/07/2021. FINDINGS: The heart size and mediastinal contours are within normal limits. The lung volumes are low. There is mild left basilar atelectasis/airspace disease. The right lung is clear. No large pleural effusion or pneumothorax is identified. The visualized skeletal structures are unremarkable. IMPRESSION: Low lung volumes with mild left basilar atelectasis/airspace disease. Electronically Signed   By: Romona Curls M.D.   On: 07/21/2023 13:15     Assessment/Plan: Multifocal bladder lesions consistent  with urothelial carcinoma.  He is on for second stage resection tomorrow.       Solitary right kidney with 8mm renal pelvic stone.  His Cr is normal.   Anemia.   He will be transfused today.   Hematuria.  Hold Lovenox.         No follow-ups on file.    CC: Dr. Richarda Overlie      Bjorn Pippin 07/22/2023

## 2023-07-23 ENCOUNTER — Inpatient Hospital Stay (HOSPITAL_COMMUNITY): Payer: Medicare Other | Admitting: Certified Registered"

## 2023-07-23 ENCOUNTER — Encounter (HOSPITAL_COMMUNITY): Payer: Self-pay | Admitting: Internal Medicine

## 2023-07-23 ENCOUNTER — Ambulatory Visit (HOSPITAL_COMMUNITY): Admission: RE | Admit: 2023-07-23 | Payer: Medicare Other | Source: Home / Self Care | Admitting: Urology

## 2023-07-23 ENCOUNTER — Encounter (HOSPITAL_COMMUNITY)
Admission: EM | Disposition: A | Discharge status: Skilled Nursing Facility | Payer: Self-pay | Source: Skilled Nursing Facility | Attending: Internal Medicine

## 2023-07-23 DIAGNOSIS — I10 Essential (primary) hypertension: Secondary | ICD-10-CM

## 2023-07-23 DIAGNOSIS — I480 Paroxysmal atrial fibrillation: Secondary | ICD-10-CM

## 2023-07-23 DIAGNOSIS — C678 Malignant neoplasm of overlapping sites of bladder: Secondary | ICD-10-CM | POA: Diagnosis not present

## 2023-07-23 DIAGNOSIS — C679 Malignant neoplasm of bladder, unspecified: Secondary | ICD-10-CM

## 2023-07-23 DIAGNOSIS — K922 Gastrointestinal hemorrhage, unspecified: Secondary | ICD-10-CM

## 2023-07-23 DIAGNOSIS — I2510 Atherosclerotic heart disease of native coronary artery without angina pectoris: Secondary | ICD-10-CM | POA: Diagnosis not present

## 2023-07-23 DIAGNOSIS — N390 Urinary tract infection, site not specified: Secondary | ICD-10-CM | POA: Diagnosis not present

## 2023-07-23 DIAGNOSIS — T83511A Infection and inflammatory reaction due to indwelling urethral catheter, initial encounter: Secondary | ICD-10-CM | POA: Diagnosis not present

## 2023-07-23 HISTORY — PX: TRANSURETHRAL RESECTION OF BLADDER TUMOR: SHX2575

## 2023-07-23 LAB — GLUCOSE, CAPILLARY: Glucose-Capillary: 89 mg/dL (ref 70–99)

## 2023-07-23 SURGERY — TURBT (TRANSURETHRAL RESECTION OF BLADDER TUMOR)
Anesthesia: General

## 2023-07-23 MED ORDER — FENTANYL CITRATE (PF) 100 MCG/2ML IJ SOLN
INTRAMUSCULAR | Status: DC | PRN
  Administered 2023-07-23 (×2): 25 ug via INTRAVENOUS

## 2023-07-23 MED ORDER — ROCURONIUM BROMIDE 10 MG/ML (PF) SYRINGE
PREFILLED_SYRINGE | INTRAVENOUS | Status: AC
  Filled 2023-07-23: qty 10

## 2023-07-23 MED ORDER — ORAL CARE MOUTH RINSE: 15.0000 mL | Freq: Once | OROMUCOSAL | Status: AC

## 2023-07-23 MED ORDER — PROPOFOL 10 MG/ML IV BOLUS
INTRAVENOUS | Status: DC | PRN
Start: 2023-07-23 — End: 2023-07-23
  Administered 2023-07-23: 70 mg via INTRAVENOUS

## 2023-07-23 MED ORDER — LIDOCAINE HCL (PF) 2 % IJ SOLN
INTRAMUSCULAR | Status: AC
  Filled 2023-07-23: qty 10

## 2023-07-23 MED ORDER — DEXAMETHASONE SODIUM PHOSPHATE 10 MG/ML IJ SOLN
INTRAMUSCULAR | Status: AC
  Filled 2023-07-23: qty 1

## 2023-07-23 MED ORDER — ONDANSETRON HCL 4 MG/2ML IJ SOLN
INTRAMUSCULAR | Status: AC
  Filled 2023-07-23: qty 2

## 2023-07-23 MED ORDER — LACTATED RINGERS IV SOLN: INTRAVENOUS | Status: DC

## 2023-07-23 MED ORDER — SODIUM CHLORIDE 0.9 % IV SOLN: INTRAVENOUS | Status: DC

## 2023-07-23 MED ORDER — SODIUM CHLORIDE 0.9 % IR SOLN
Status: DC | PRN
  Administered 2023-07-23 (×2): 3000 mL
  Administered 2023-07-23 (×2): 6000 mL
  Administered 2023-07-23: 3000 mL
  Administered 2023-07-23 (×2): 6000 mL
  Administered 2023-07-23 (×2): 3000 mL

## 2023-07-23 MED ORDER — METOPROLOL TARTRATE 5 MG/5ML IV SOLN
INTRAVENOUS | Status: DC | PRN
  Administered 2023-07-23: 2.5 mg via INTRAVENOUS
  Administered 2023-07-23: 1 mg via INTRAVENOUS

## 2023-07-23 MED ORDER — SUGAMMADEX SODIUM 200 MG/2ML IV SOLN
INTRAVENOUS | Status: DC | PRN
  Administered 2023-07-23: 200 mg via INTRAVENOUS

## 2023-07-23 MED ORDER — SODIUM CHLORIDE 0.9 % IR SOLN
3000.0000 mL | Status: DC
  Administered 2023-07-23: 3000 mL

## 2023-07-23 MED ORDER — SULFASALAZINE 500 MG PO TABS
1000.0000 mg | ORAL_TABLET | Freq: Four times a day (QID) | ORAL | Status: DC
  Administered 2023-07-23 – 2023-08-03 (×44): 1000 mg via ORAL
  Filled 2023-07-23 (×46): qty 2

## 2023-07-23 MED ORDER — 0.9 % SODIUM CHLORIDE (POUR BTL) OPTIME
TOPICAL | Status: DC | PRN
  Administered 2023-07-23: 1000 mL

## 2023-07-23 MED ORDER — LACTATED RINGERS IV SOLN
INTRAVENOUS | Status: DC | PRN
Start: 2023-07-23 — End: 2023-07-23

## 2023-07-23 MED ORDER — FENTANYL CITRATE (PF) 100 MCG/2ML IJ SOLN
INTRAMUSCULAR | Status: AC
  Filled 2023-07-23: qty 2

## 2023-07-23 MED ORDER — PROPOFOL 10 MG/ML IV BOLUS
INTRAVENOUS | Status: AC
  Filled 2023-07-23: qty 20

## 2023-07-23 MED ORDER — PHENYLEPHRINE HCL-NACL 20-0.9 MG/250ML-% IV SOLN
INTRAVENOUS | Status: DC | PRN
  Administered 2023-07-23: 20 ug/min via INTRAVENOUS

## 2023-07-23 MED ORDER — METOPROLOL TARTRATE 5 MG/5ML IV SOLN
INTRAVENOUS | Status: AC
  Filled 2023-07-23: qty 5

## 2023-07-23 MED ORDER — ONDANSETRON HCL 4 MG/2ML IJ SOLN
INTRAMUSCULAR | Status: DC | PRN
  Administered 2023-07-23: 4 mg via INTRAVENOUS

## 2023-07-23 MED ORDER — LIDOCAINE HCL (PF) 2 % IJ SOLN
INTRAMUSCULAR | Status: AC
  Filled 2023-07-23: qty 5

## 2023-07-23 MED ORDER — LOPERAMIDE HCL 2 MG PO CAPS
2.0000 mg | ORAL_CAPSULE | Freq: Four times a day (QID) | ORAL | Status: DC
  Administered 2023-07-23 – 2023-07-26 (×10): 2 mg via ORAL
  Filled 2023-07-23 (×10): qty 1

## 2023-07-23 MED ORDER — ROCURONIUM BROMIDE 10 MG/ML (PF) SYRINGE
PREFILLED_SYRINGE | INTRAVENOUS | Status: DC | PRN
  Administered 2023-07-23: 10 mg via INTRAVENOUS
  Administered 2023-07-23: 50 mg via INTRAVENOUS
  Administered 2023-07-23 (×3): 10 mg via INTRAVENOUS

## 2023-07-23 MED ORDER — LIDOCAINE 2% (20 MG/ML) 5 ML SYRINGE
INTRAMUSCULAR | Status: DC | PRN
  Administered 2023-07-23 (×2): 40 mg via INTRAVENOUS

## 2023-07-23 MED ORDER — CHLORHEXIDINE GLUCONATE 0.12 % MT SOLN
15.0000 mL | Freq: Once | OROMUCOSAL | Status: AC
  Administered 2023-07-23: 15 mL via OROMUCOSAL

## 2023-07-23 MED ORDER — DEXAMETHASONE SODIUM PHOSPHATE 10 MG/ML IJ SOLN
INTRAMUSCULAR | Status: DC | PRN
  Administered 2023-07-23: 4 mg via INTRAVENOUS

## 2023-07-23 SURGICAL SUPPLY — 23 items
BAG DRN RND TRDRP ANRFLXCHMBR (UROLOGICAL SUPPLIES)
BAG URINE DRAIN 2000ML AR STRL (UROLOGICAL SUPPLIES) IMPLANT
BAG URO CATCHER STRL LF (MISCELLANEOUS) ×1 IMPLANT
CATH FOLEY 3WAY 30CC 22FR (CATHETERS) IMPLANT
CATH HEMA 3WAY 30CC 24FR COUDE (CATHETERS) IMPLANT
CATH URETL OPEN 5X70 (CATHETERS) IMPLANT
DRAPE FOOT SWITCH (DRAPES) ×1 IMPLANT
ELECT REM PT RETURN 15FT ADLT (MISCELLANEOUS) ×1 IMPLANT
GLOVE SURG SS PI 8.0 STRL IVOR (GLOVE) ×1 IMPLANT
GOWN STRL REUS W/ TWL XL LVL3 (GOWN DISPOSABLE) ×1 IMPLANT
GOWN STRL REUS W/TWL XL LVL3 (GOWN DISPOSABLE) ×1
HOLDER FOLEY CATH W/STRAP (MISCELLANEOUS) IMPLANT
KIT TURNOVER KIT A (KITS) IMPLANT
LOOP CUT BIPOLAR 24F LRG (ELECTROSURGICAL) IMPLANT
MANIFOLD NEPTUNE II (INSTRUMENTS) ×1 IMPLANT
PACK CYSTO (CUSTOM PROCEDURE TRAY) ×1 IMPLANT
SET IRRIG Y TYPE TUR BLADDER L (SET/KITS/TRAYS/PACK) IMPLANT
SUT ETHILON 3 0 PS 1 (SUTURE) IMPLANT
SYR 30ML LL (SYRINGE) IMPLANT
SYR TOOMEY IRRIG 70ML (MISCELLANEOUS) ×1
SYRINGE TOOMEY IRRIG 70ML (MISCELLANEOUS) IMPLANT
TUBING CONNECTING 10 (TUBING) ×1 IMPLANT
TUBING UROLOGY SET (TUBING) ×1 IMPLANT

## 2023-07-23 NOTE — Anesthesia Preprocedure Evaluation (Signed)
Anesthesia Evaluation  Patient identified by MRN, date of birth, ID band Patient confused    Reviewed: Allergy & Precautions, NPO status , Patient's Chart, lab work & pertinent test results, reviewed documented beta blocker date and time   Airway Mallampati: II  TM Distance: >3 FB Neck ROM: Full    Dental  (+) Poor Dentition, Missing, Chipped, Dental Advisory Given   Pulmonary asthma  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   Pulmonary exam normal breath sounds clear to auscultation       Cardiovascular hypertension, Pt. on home beta blockers and Pt. on medications + CAD and + CABG  Normal cardiovascular exam+ dysrhythmias Atrial Fibrillation  Rhythm:Regular Rate:Normal  TTE 2024  1. Left ventricular ejection fraction, by estimation, is 50 to 55%. The  left ventricle has low normal function. The left ventricle demonstrates  regional wall motion abnormalities (see scoring diagram/findings for  description). Left ventricular diastolic   parameters are indeterminate.   2. Right ventricular systolic function is normal. The right ventricular  size is normal.   3. The mitral valve is normal in structure. Trivial mitral valve  regurgitation. No evidence of mitral stenosis.   4. The aortic valve is normal in structure. Aortic valve regurgitation is  trivial. Aortic valve sclerosis/calcification is present, without any  evidence of aortic stenosis.   5. The inferior vena cava is normal in size with greater than 50%  respiratory variability, suggesting right atrial pressure of 3 mmHg.     Neuro/Psych negative neurological ROS  negative psych ROS   GI/Hepatic Neg liver ROS, PUD,GERD  ,,UC   Endo/Other  negative endocrine ROS    Renal/GU negative Renal ROS  negative genitourinary   Musculoskeletal  (+) Arthritis ,    Abdominal   Peds  Hematology  (+) Blood dyscrasia, anemia Lab Results      Component                Value                Date                      WBC                      6.8                 07/23/2023                HGB                      9.0 (L)             07/23/2023                HCT                      30.8 (L)            07/23/2023                MCV                      91.9                07/23/2023                PLT  291                 07/23/2023              Anesthesia Other Findings Pt oxygen sats 82% on room air upon arrival to preop, placed on O2  Reproductive/Obstetrics                             Anesthesia Physical Anesthesia Plan  ASA: 3  Anesthesia Plan: General   Post-op Pain Management:    Induction: Intravenous  PONV Risk Score and Plan: Dexamethasone, Ondansetron and Treatment may vary due to age or medical condition  Airway Management Planned: Oral ETT  Additional Equipment:   Intra-op Plan:   Post-operative Plan: Extubation in OR and Possible Post-op intubation/ventilation  Informed Consent: I have reviewed the patients History and Physical, chart, labs and discussed the procedure including the risks, benefits and alternatives for the proposed anesthesia with the patient or authorized representative who has indicated his/her understanding and acceptance.   Patient has DNR.  Discussed DNR with power of attorney and Continue DNR.   Dental advisory given  Plan Discussed with: CRNA  Anesthesia Plan Comments: (Due to confusion and respiratory status, there is a concern pt may require post-op ventilation. Discussed this risk with POA, and the decision was made to proceed with surgery. Per POA, intra-op CPR. )        Anesthesia Quick Evaluation

## 2023-07-23 NOTE — Transfer of Care (Signed)
Immediate Anesthesia Transfer of Care Note  Patient: Barry [name]  Procedure(s) Performed: TRANSURETHRAL RESECTION OF BLADDER TUMOR (TURBT)  Patient Location: PACU  Anesthesia Type:General  Level of Consciousness: awake and drowsy  Airway & Oxygen Therapy: Patient Spontanous Breathing and Patient connected to face mask oxygen  Post-op Assessment: Report given to RN and Post -op Vital signs reviewed and stable  Post vital signs: Reviewed and stable  Last Vitals:  Vitals Value Taken Time  BP 113/80 07/23/23 1121  Temp    Pulse 93 07/23/23 1124  Resp 20 07/23/23 1124  SpO2 100 % 07/23/23 1124  Vitals shown include unfiled device data.  Last Pain:  Vitals:   07/23/23 0645  TempSrc: Oral  PainSc: 0-No pain         Complications: No notable events documented.

## 2023-07-23 NOTE — Anesthesia Procedure Notes (Signed)
Procedure Name: Intubation Date/Time: 07/23/2023 8:48 AM  Performed by: Sindy Guadeloupe, CRNAPre-anesthesia Checklist: Patient identified, Emergency Drugs available, Suction available, Patient being monitored and Timeout performed Patient Re-evaluated:Patient Re-evaluated prior to induction Oxygen Delivery Method: Circle system utilized Preoxygenation: Pre-oxygenation with 100% oxygen Induction Type: IV induction Ventilation: Mask ventilation without difficulty Laryngoscope Size: Mac and 4 Grade View: Grade II Tube type: Oral Tube size: 7.5 mm Number of attempts: 1 Airway Equipment and Method: Stylet Placement Confirmation: ETT inserted through vocal cords under direct vision, positive ETCO2 and breath sounds checked- equal and bilateral Secured at: 23 cm Tube secured with: Tape Dental Injury: Teeth and Oropharynx as per pre-operative assessment

## 2023-07-23 NOTE — Op Note (Signed)
Procedure: Cystoscopy with transurethral resection of large greater than 5 cm bladder tumor.  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  Postop diagnosis: Same.  Surgeon: Dr. Bjorn Pippin.  Anesthesia: General.  Specimen: Bladder tumor chips.  Drains: 24 French three-way hematuria Foley.  EBL: 250 ml.  Complications: None.  Indications: The patient is a 77 year old male with extensive urothelial carcinoma of overlapping sites in the bladder who previously undergone an initial resection.  He was found to have T1 high-grade urothelial carcinoma but due to the extent of the disease was only partially resected.  It was felt that 3 resection was indicated to try to given the best chance of being hematuria and catheter free.  Procedure: He was taken the operating room was given Rocephin.  A general anesthetic with a paralytic agent was given.  He is placed in lithotomy position and fitted with PAS hose.  His perineum and genitalia were prepped with Betadine solution draped in usual sterile fashion.  Cystoscopy diascopy was performed using the 26 French continuous-flow resectoscope sheath with a visual obturator.  Examination: Normal urethra.  The external sphincter was intact.  The prostatic urethra had evidence of recent resection from removal of bladder tumors from the prostatic urethra.  Inspection of the bladder revealed extensive urothelial carcinoma with some areas of recent resection.  The visual obturator was replaced with an Wandra Scot handle with a bipolar loop and 30 degree lens.  Saline was used as the irrigant.  Of note he has a solitary right kidney and the ureteral orifice was never seen on the prior resection.  Initially resection of tumor at the base and bladder neck was performed which helped cleared an area in the trigone but once again ureteral orifice was not seen.  I then resected tumor from the right lateral wall and anterior wall but it remained  quite extensive despite significant resection.  Hemostasis was problematic throughout due to the friable extensive tumor.  I also resected tumor from the left lateral wall and dome but once again was unable to remove all the cancer in this area.  The total area of resection exceeded 5 cm in greatest diameter.  I did resected bladder wall and several areas.  Periodically throughout the procedure the bladder was evacuated free of clots and chips.  Eventually after approximately an hour and a half to an hour and 45 minutes of resection I realized that I was not going to be able to remove the entire tumor bulk but had cleared an extensive area so I achieved what I thought initially was acceptable hemostasis and remove the scope.  A 24 French hematuria catheter was placed and was hand irrigated and placed on CBI but the return remained very bloody.  The Foley was removed and the scope was replaced and I meticulously searched the bladder wall for active bleeding and I did find an area on the left anterior lateral wall with an arterial bleeder that I fulgurated.  There was considerable oozing from several other areas of papillary tumor which I fulgurated as generously as possible.  I also found another arterial bleeder on the right posterior lateral wall along with some oozing from fronds of the tumor that were in a posterior wall diverticulum which I fulgurated generously.  Eventually I was able to achieve adequate hemostasis and made sure all clots and tumor chips were removed.  The scope was then removed and the Foley catheter was replaced.  The balloon was filled with 30  mL of sterile fluid.  Catheter was placed to CBI and was hand irrigated with light pink return, significantly improved over the initial attempt.  The catheter was then placed to straight drainage.  He was taken down from lithotomy position, his anesthetic was reversed and he was moved to recovery room in guarded condition.  There were no  complications.

## 2023-07-23 NOTE — Progress Notes (Signed)
  PT Cancellation Note  Patient Details Name: Barry [name] MRN: [id number] DOB: Sep 14, 1946   Cancelled Treatment:    Reason Eval/Treat Not Completed: Patient at procedure or test/unavailable Blanchard Kelch PT Acute Rehabilitation Services Office [phone] Weekend [phone]   Rada Hay 07/23/2023, 7:26 AM

## 2023-07-23 NOTE — Anesthesia Postprocedure Evaluation (Signed)
Anesthesia Post Note  Patient: Barry [name]  Procedure(s) Performed: TRANSURETHRAL RESECTION OF BLADDER TUMOR (TURBT)     Patient location during evaluation: PACU Anesthesia Type: General Level of consciousness: awake and alert Pain management: pain level controlled Vital Signs Assessment: post-procedure vital signs reviewed and stable Respiratory status: spontaneous breathing, nonlabored ventilation, respiratory function stable and patient connected to nasal cannula oxygen Cardiovascular status: blood pressure returned to baseline and stable Postop Assessment: no apparent nausea or vomiting Anesthetic complications: no  No notable events documented.  Last Vitals:  Vitals:   07/23/23 1200 07/23/23 1224  BP: 112/68 (!) 107/59  Pulse: 88 85  Resp: 19 20  Temp: 37.2 C 36.8 C  SpO2: 96% 95%    Last Pain:  Vitals:   07/23/23 1224  TempSrc: Axillary  PainSc:                   L 

## 2023-07-23 NOTE — Progress Notes (Signed)
PROGRESS NOTE  Barry [name]  DOB: February 01, 1946  PCP: Georgann Housekeeper, MD ZOX:[id number]  DOA: 07/21/2023  LOS: 2 days  Hospital Day: 3  Brief narrative: Barry [name] is a 77 y.o. male with PMH significant for obesity, HTN, HLD, A-fib on anticoagulation, CAD/CABG,  GERD, ulcerative colitis, chronic anemia, asthma, bladder cancer under the care of urology Dr. Annabell Howells. Recently underwent partial resection of a large bladder tumor in July and was scheduled for second stage on 07/23/2023. After his surgery in July, he was discharged to adult family for rehab but apparently patient has physical and mental declined and mostly remained bedbound for last several days.  8/4, patient presented to the ED with complaint altered mental status, progressively worsening for few days.  In the ED, patient was tachycardic in A-fib Labs with normal WBC count, hemoglobin at baseline Urinalysis showed turbid amber-colored urine with moderate hemoglobin with large leukocytes, many bacteria Patient was started on IV Rocephin Admitted to [facility] Urology consulted Urine culture grew E. coli.   Subjective: Patient was seen and examined this afternoon. Elderly Caucasian male.  Somnolent.  Opens eyes on command.  Not in pain.  Slow to respond but oriented to place person and month.  His friend/POA Consuella Lose at bedside.  We had a long conversation about patient's current medical condition, most likely natural course events and disposition  Assessment and plan: UTI associated with Foley catheter Urinalysis showed turbid amber-colored urine with moderate hemoglobin with large leukocytes, many bacteria Urine culture pending. Currently on IV Rocephin  Progressive urothelial carcinoma bladder S/p partial resection July as well as repeat resection today 8/6 - Dr. Annabell Howells Per operative note, patient had extensive urothelial carcinoma.  The total area of resection exceeded 5 cm and probably unable to remove the entire tumor bulk.   Hematuria catheter was irrigated and CBI started. Urology to follow-up  Chronic diarrhea History of ulcerative colitis Has history of ulcerative colitis and follows up with GI Dr. Levora Angel.  He is on sulfasalazine despite which was last 7 days, patient had significant diarrhea without blood Last CT abdomen from June 2024 did not show any evidence of active lesions. Continue sulfasalazine, Questran.  Also started on Imodium scheduled for next 3 days. C. difficile and GI pathogen panel negative Continue to monitor bowel movements.  Hyponatremia chronic and stable, unlikely to be of clinical significance Recent Labs  Lab 07/21/23 1205 07/22/23 0346 07/23/23 0359  NA 131* 131* 133*   Acute metabolic encephalopathy Lethargic on admission due to combination of UTI, malignancy, dehydration, low electrolytes. Continues to remain slow to respond.  Per POA, patient has been physically and mentally declining for the last few weeks. Continue to monitor mental status change  Atrial fibrillation continue Lopressor and Cardizem It seems, he was on Xarelto in the past which was appropriately stopped because of persistent hematuria   Iron deficiency anemia stable, continue iron supplementation Recent Labs    06/29/23 0309 07/02/23 0524 07/21/23 1205 07/22/23 0346 07/23/23 0359  HGB 8.2* 8.2* 9.6* 7.8* 9.0*  MCV 92.7 90.8 92.3 93.7 91.9   Hypertension continue losartan    Mobility: PT eval pending  Goals of care   Code Status: DNR.  Palliative care consulted for goals of care    DVT prophylaxis:  SCDs Start: 07/21/23 1446   Antimicrobials: IV Rocephin Fluid: NS at 75 to continue Consultants: Urology, palliative care Family Communication: Patient had his no immediate family member.  His friend/POA Consuella Lose at bedside  Status: Inpatient Level  of care:  Med-Surg   Patient from: [facility] from short-term rehab Anticipated d/c to: Pending clinical course Needs to continue  in-hospital care:  Continues to have diarrhea, altered mental status   Diet:  Diet Order             Diet regular Room service appropriate? Yes; Fluid consistency: Thin  Diet effective now                   Scheduled Meds:  atorvastatin  40 mg Oral Daily   Chlorhexidine Gluconate Cloth  6 each Topical Daily   cholestyramine light  4 g Oral BID   cycloSPORINE  1 drop Both Eyes BID   diltiazem  240 mg Oral Daily   escitalopram  20 mg Oral Daily   ezetimibe  10 mg Oral Daily   feeding supplement  237 mL Oral BID BM   ferrous sulfate  325 mg Oral Q breakfast   Gerhardt's butt cream   Topical TID   latanoprost  1 drop Both Eyes QHS   loperamide  2 mg Oral Q6H   losartan  25 mg Oral Daily   metoprolol tartrate  50 mg Oral BID   mometasone-formoterol  2 puff Inhalation BID   montelukast  10 mg Oral QHS   mupirocin ointment  1 Application Nasal BID   pantoprazole  40 mg Oral Daily   sulfaSALAzine  1,000 mg Oral QID    PRN meds: acetaminophen **OR** acetaminophen, albuterol, mouth rinse   Infusions:   sodium chloride     cefTRIAXone (ROCEPHIN)  IV 0 g (07/22/23 1302)   sodium chloride irrigation      Antimicrobials: Anti-infectives (From admission, onward)    Start     Dose/Rate Route Frequency Ordered Stop   07/22/23 1200  cefTRIAXone (ROCEPHIN) 1 g in sodium chloride 0.9 % 100 mL IVPB        1 g 200 mL/hr over 30 Minutes Intravenous Every 24 hours 07/21/23 1447     07/21/23 1200  cefTRIAXone (ROCEPHIN) 2 g in sodium chloride 0.9 % 100 mL IVPB  Status:  Discontinued        2 g 200 mL/hr over 30 Minutes Intravenous Every 24 hours 07/21/23 1155 07/21/23 1447       Nutritional status:  Body mass index is 33.04 kg/m.          Objective: Vitals:   07/23/23 1224 07/23/23 1608  BP: (!) 107/59 114/61  Pulse: 85 93  Resp: 20 20  Temp: 98.3 F (36.8 C) 98 F (36.7 C)  SpO2: 95% 95%    Intake/Output Summary (Last 24 hours) at 07/23/2023 1623 Last data  filed at 07/23/2023 1541 Gross per 24 hour  Intake 7730.75 ml  Output 9400 ml  Net -1669.25 ml   Filed Weights   07/21/23 1530 07/23/23 0645  Weight: 84.6 kg 84.6 kg   Weight change: 0 kg Body mass index is 33.04 kg/m.   Physical Exam: General exam: Pleasant elderly Caucasian male.  Not in pain Skin: No rashes, lesions or ulcers. HEENT: Atraumatic, normocephalic, no obvious bleeding Lungs: Clear to auscultation bilaterally CVS: Regular rate and rhythm, no murmur GI/Abd soft, nontender, nondistended, bowel sound present CNS: Somnolent, opens eyes on command.  Slow to respond but oriented to place and person and month Psychiatry: Sad affect Extremities: No pedal edema, no calf tenderness  Data Review: I have personally reviewed the laboratory data and studies available.  F/u labs ordered [employer] (From  admission, onward)    None       Total time spent in review of labs and imaging, patient evaluation, formulation of plan, documentation and communication with family: 65 minutes  Signed, Lorin Glass, MD [facility] 07/23/2023

## 2023-07-23 NOTE — Interval H&P Note (Signed)
History and Physical Interval Note: Urine remains clear.  Hgb is 9.   He has some persistent bladder discomfort.    07/23/2023 7:44 AM  [name]  has presented today for surgery, with the diagnosis of BLADDER TUMOR.  The various methods of treatment have been discussed with the patient and family. After consideration of risks, benefits and other options for treatment, the patient has consented to  Procedure(s) with comments: TRANSURETHRAL RESECTION OF BLADDER TUMOR (TURBT) (N/A) - 90 MINS FOR CASE as a surgical intervention.  The patient's history has been reviewed, patient examined, no change in status, stable for surgery.  I have reviewed the patient's chart and labs.  Questions were answered to the patient's satisfaction.     Barry [name]

## 2023-07-24 ENCOUNTER — Encounter (HOSPITAL_COMMUNITY): Payer: Self-pay | Admitting: Urology

## 2023-07-24 DIAGNOSIS — T83511A Infection and inflammatory reaction due to indwelling urethral catheter, initial encounter: Secondary | ICD-10-CM | POA: Diagnosis not present

## 2023-07-24 DIAGNOSIS — N390 Urinary tract infection, site not specified: Secondary | ICD-10-CM | POA: Diagnosis not present

## 2023-07-24 MED ORDER — METOPROLOL TARTRATE 25 MG PO TABS
12.5000 mg | ORAL_TABLET | Freq: Two times a day (BID) | ORAL | Status: DC
  Administered 2023-07-24 – 2023-08-03 (×20): 12.5 mg via ORAL
  Filled 2023-07-24 (×20): qty 1

## 2023-07-24 MED ORDER — DILTIAZEM HCL ER COATED BEADS 120 MG PO CP24
120.0000 mg | ORAL_CAPSULE | Freq: Every day | ORAL | Status: DC
  Administered 2023-07-25 – 2023-08-03 (×10): 120 mg via ORAL
  Filled 2023-07-24 (×10): qty 1

## 2023-07-24 NOTE — Progress Notes (Signed)
1 Day Post-Op  Subjective: Barry [name] is doing well with decreased pain.  He has no complaints and his urine is clear on minimal CBI.   ROS:  Review of Systems  All other systems reviewed and are negative.   Anti-infectives: Anti-infectives (From admission, onward)    Start     Dose/Rate Route Frequency Ordered Stop   07/22/23 1200  cefTRIAXone (ROCEPHIN) 1 g in sodium chloride 0.9 % 100 mL IVPB        1 g 200 mL/hr over 30 Minutes Intravenous Every 24 hours 07/21/23 1447     07/21/23 1200  cefTRIAXone (ROCEPHIN) 2 g in sodium chloride 0.9 % 100 mL IVPB  Status:  Discontinued        2 g 200 mL/hr over 30 Minutes Intravenous Every 24 hours 07/21/23 1155 07/21/23 1447       Current Facility-Administered Medications  Medication Dose Route Frequency Provider Last Rate Last Admin   0.9 %  sodium chloride infusion   Intravenous Continuous Dahal, Binaya, MD 75 mL/hr at 07/23/23 1834 Infusion Verify at 07/23/23 1834   acetaminophen (TYLENOL) tablet 650 mg  650 mg Oral Q6H PRN Kirby Crigler, Mir M, MD       Or   acetaminophen (TYLENOL) suppository 650 mg  650 mg Rectal Q6H PRN Kirby Crigler, Mir M, MD       albuterol (PROVENTIL) (2.5 MG/3ML) 0.083% nebulizer solution 2.5 mg  2.5 mg Nebulization Q2H PRN Kirby Crigler, Mir M, MD       atorvastatin (LIPITOR) tablet 40 mg  40 mg Oral Daily Kirby Crigler, Mir M, MD   40 mg at 07/22/23 1053   cefTRIAXone (ROCEPHIN) 1 g in sodium chloride 0.9 % 100 mL IVPB  1 g Intravenous Q24H Kirby Crigler, Mir M, MD 0 mL/hr at 07/22/23 1302 1 g at 07/23/23 0900   Chlorhexidine Gluconate Cloth 2 % PADS 6 each  6 each Topical Daily Marcelino Duster, MD   6 each at 07/23/23 1755   cholestyramine light (PREVALITE) packet 4 g  4 g Oral BID Kirby Crigler, Mir M, MD   4 g at 07/23/23 2212   cycloSPORINE (RESTASIS) 0.05 % ophthalmic emulsion 1 drop  1 drop Both Eyes BID Kirby Crigler, Mir M, MD   1 drop at 07/23/23 2213   diltiazem (CARDIZEM CD) 24 hr capsule 240 mg  240 mg Oral Daily  Kirby Crigler, Mir M, MD   240 mg at 07/23/23 1811   escitalopram (LEXAPRO) tablet 20 mg  20 mg Oral Daily Kirby Crigler, Mir M, MD   20 mg at 07/22/23 1052   ezetimibe (ZETIA) tablet 10 mg  10 mg Oral Daily Kirby Crigler, Mir M, MD   10 mg at 07/22/23 1052   feeding supplement (ENSURE ENLIVE / ENSURE PLUS) liquid 237 mL  237 mL Oral BID BM Kirby Crigler, Mir M, MD   237 mL at 07/23/23 1701   ferrous sulfate tablet 325 mg  325 mg Oral Q breakfast Kirby Crigler, Mir M, MD   325 mg at 07/22/23 7829   Gerhardt's butt cream   Topical TID Maryln Gottron, MD   1 Application at 07/23/23 2213   latanoprost (XALATAN) 0.005 % ophthalmic solution 1 drop  1 drop Both Eyes QHS Kirby Crigler, Mir M, MD   1 drop at 07/23/23 2213   loperamide (IMODIUM) capsule 2 mg  2 mg Oral Q6H Dahal, Binaya, MD   2 mg at 07/24/23 0119   losartan (COZAAR) tablet 25 mg  25 mg Oral Daily Kirby Crigler, Mir Judie Petit, MD  25 mg at 07/22/23 1053   metoprolol tartrate (LOPRESSOR) tablet 50 mg  50 mg Oral BID Kirby Crigler, Mir M, MD   50 mg at 07/23/23 2214   mometasone-formoterol (DULERA) 200-5 MCG/ACT inhaler 2 puff  2 puff Inhalation BID Maryln Gottron, MD   2 puff at 07/23/23 1936   montelukast (SINGULAIR) tablet 10 mg  10 mg Oral QHS Kirby Crigler, Mir M, MD   10 mg at 07/23/23 2214   mupirocin ointment (BACTROBAN) 2 % 1 Application  1 Application Nasal BID Maryln Gottron, MD   1 Application at 07/23/23 2213   Oral care mouth rinse  15 mL Mouth Rinse PRN Kirby Crigler, Mir M, MD       pantoprazole (PROTONIX) EC tablet 40 mg  40 mg Oral Daily Kirby Crigler, Mir M, MD   40 mg at 07/22/23 1052   sulfaSALAzine (AZULFIDINE) tablet 1,000 mg  1,000 mg Oral QID Lorin Glass, MD   1,000 mg at 07/23/23 2213     Objective: Vital signs in last 24 hours: Temp:  [97.4 F (36.3 C)-99 F (37.2 C)] 97.8 F (36.6 C) (08/07 0417) Pulse Rate:  [64-125] 64 (08/07 0417) Resp:  [13-24] 17 (08/07 0417) BP: (101-126)/(53-80) 105/59 (08/07 0417) SpO2:  [93 %-100 %] 95 %  (08/07 0417) Weight:  [84.6 kg] 84.6 kg (08/06 0645)  Intake/Output from previous day: 08/06 0701 - 08/07 0700 In: 7580.1 [P.O.:240; I.V.:1940.1; IV Piggyback:100] Out: 14782 [Urine:11350; Blood:250] Intake/Output this shift: Total I/O In: 710.1 [I.V.:710.1] Out: 2900 [Urine:2900]   Physical Exam Vitals reviewed.  Constitutional:      Appearance: Normal appearance.  Neurological:     Mental Status: He is alert.     Lab Results:  Recent Labs    07/22/23 0346 07/23/23 0359  WBC 7.0 6.8  HGB 7.8* 9.0*  HCT 26.9* 30.8*  PLT 324 291   BMET Recent Labs    07/22/23 0346 07/23/23 0359  NA 131* 133*  K 3.5 3.5  CL 104 104  CO2 18* 20*  GLUCOSE 89 96  BUN 14 15  CREATININE 0.56* 0.58*  CALCIUM 8.2* 8.7*   PT/INR Recent Labs    07/21/23 1205  LABPROT 14.2  INR 1.1   ABG No results for input(s): "PHART", "HCO3" in the last 72 hours.  Invalid input(s): "PCO2", "PO2"  Studies/Results: No results found.   Assessment and Plan: Extensive bladder cancer with incomplete resection.   His urine is clear today.  I will stop CBI.  We could consider a trial without catheter tomorrow.   He is not a good candidate for further therapy.   Solitary right kidney with renal pelvic stone.  His UOP is adequate and his Cr was normal on admission.  If the stone becomes obstructing, he would need nephrostomy drainage.       LOS: 3 days    Bjorn Pippin 8/7/2024Patient ID: Barry [name], male   DOB: Dec 25, 1945, 77 y.o.   MRN: [id number]

## 2023-07-24 NOTE — Progress Notes (Signed)
Initial Nutrition Assessment  DOCUMENTATION CODES:   Obesity unspecified  INTERVENTION:  - Regular diet.  - Ensure Plus High Protein po BID, each supplement provides 350 kcal and 20 grams of protein. - Encourage intake. - Monitor weight trends.   NUTRITION DIAGNOSIS:   Unintentional weight loss related to chronic illness, acute illness as evidenced by percent weight loss (11% in ~2 months).  GOAL:   Patient will meet greater than or equal to 90% of their needs  MONITOR:   PO intake, Supplement acceptance, Weight trends  REASON FOR ASSESSMENT:   Malnutrition Screening Tool    ASSESSMENT:   77 y.o. male with PMH significant for obesity, HTN, HLD, A-fib on anticoagulation, CAD/CABG,  GERD, ulcerative colitis, chronic anemia, asthma, bladder cancer. Recently underwent partial resection of a large bladder tumor in July and was scheduled for second stage on 07/23/2023.  After his surgery in July, he was discharged to rehab but apparently declined physically and mentally and mostly remained bedbound for several days. Presented to the ED with complaint altered mental status, progressively worsening. Admitted for UTI associated with foley catheter.   Patient endorses a UBW of 200# and denies any recent changes in weight.  However, EMR indicated patient has had a possible 24# or 11% weight loss in the past ~2 months. When asked about these changes, patient reports they "could be true".   Endorses eating fairly well PTA, with 3 meals a day. Notes he was only eating about half of his meals. Then stated he has had no appetite recently. Given report in H&P of patient mentally and physically declining and being bedbound PTA question accuracy of patient's reports of intake. Especially given extent of weight loss.   He reports appetite is a little better now and that he has been eating okay. Documented to be consuming an average of 71% of meals since admission.  Reports he doesn't feel he has  been receiving Ensure but agreeable to receive during admission. However, per [facility] patient has been drinking Ensure around once daily.   Medications reviewed and include: 325mg  ferrous sulfate, Imodium  Labs reviewed:  No BMP today   NUTRITION - FOCUSED PHYSICAL EXAM:  Flowsheet Row Most Recent Value  Orbital Region No depletion  Upper Arm Region No depletion  Thoracic and Lumbar Region No depletion  Buccal Region No depletion  Temple Region No depletion  Clavicle Bone Region No depletion  Clavicle and Acromion Bone Region No depletion  Scapular Bone Region Unable to assess  Dorsal Hand No depletion  Patellar Region No depletion  Anterior Thigh Region No depletion  Posterior Calf Region No depletion  Edema (RD Assessment) None  Hair Reviewed  Eyes Reviewed  Mouth Reviewed  Skin Reviewed  Nails Reviewed       Diet Order:   Diet Order             Diet regular Room service appropriate? Yes; Fluid consistency: Thin  Diet effective now                   EDUCATION NEEDS:  Education needs have been addressed  Skin:  Skin Assessment: Skin Integrity Issues: Skin Integrity Issues:: Stage II Stage II: Bilateral buttocks  Last BM:  8/7  Height:  Ht Readings from Last 1 Encounters:  07/23/23 5\' 3"  (1.6 m)   Weight:  Wt Readings from Last 1 Encounters:  07/23/23 84.6 kg    BMI:  Body mass index is 33.04 kg/m.  Estimated Nutritional Needs:  Kcal:  1750-1900 kcals Protein:  65-85 grams Fluid:  >/= 1.8L    Shelle Iron RD, LDN For contact information, refer to [facility].

## 2023-07-24 NOTE — Plan of Care (Signed)
°  Problem: Health Behavior/Discharge Planning: Goal: Ability to manage health-related needs will improve Outcome: Progressing   Problem: Clinical Measurements: Goal: Respiratory complications will improve Outcome: Progressing   Problem: Activity: Goal: Risk for activity intolerance will decrease Outcome: Progressing   Problem: Nutrition: Goal: Adequate nutrition will be maintained Outcome: Progressing   Problem: Coping: Goal: Level of anxiety will decrease Outcome: Progressing

## 2023-07-24 NOTE — Progress Notes (Signed)
PROGRESS NOTE  Barry [name]  DOB: [birth year]  PCP: Georgann Housekeeper, MD ZOX:[id number]  DOA: 07/21/2023  LOS: 3 days  Hospital Day: 4  Brief narrative: Barry [name] is a 77 y.o. male with PMH significant for obesity, HTN, HLD, A-fib on anticoagulation, CAD/CABG,  GERD, ulcerative colitis, chronic anemia, asthma, bladder cancer under the care of urology Dr. Annabell Howells. Recently underwent partial resection of a large bladder tumor in July and was scheduled for second stage on 07/23/2023. After his surgery in July, he was discharged to adult family for rehab but apparently patient has physical and mental declined and mostly remained bedbound for last several days.  8/4, patient presented to the ED with complaint altered mental status, progressively worsening for few days.  In the ED, patient was tachycardic in A-fib Labs with normal WBC count, hemoglobin at baseline Urinalysis showed turbid amber-colored urine with moderate hemoglobin with large leukocytes, many bacteria Patient was started on IV Rocephin Admitted to [facility] Urology consulted Urine culture grew E. coli.   Subjective: Patient was seen and examined this morning.  Pleasant elderly Caucasian male.  Lying on bed.  More awake than yesterday.  Able to have some conversation.  Not in distress.  Foley catheter with blood mixed urine. POA Consuella Lose not at bedside today.  Assessment and plan: UTI associated with Foley catheter Urinalysis showed turbid amber-colored urine with moderate hemoglobin with large leukocytes, many bacteria Urine culture pending. Currently on IV Rocephin  Progressive urothelial carcinoma bladder S/p partial resection July as well as repeat resection today 8/6 - Dr. Annabell Howells Per operative note, patient had extensive urothelial carcinoma.  The total area of resection exceeded 5 cm and probably unable to remove the entire tumor bulk.  Hematuria catheter was irrigated and CBI started. Currently CBI on hold. Urology  following.  Chronic diarrhea History of ulcerative colitis Has history of ulcerative colitis and follows up with GI Dr. Levora Angel.  He is on sulfasalazine despite which was last 7 days, patient had significant diarrhea without blood Last CT abdomen from June 2024 did not show any evidence of active lesions. Currently on sulfasalazine, Questran.  Also on Imodium scheduled since 8/6. C. difficile and GI pathogen panel negative Still has loose bowel movements but slowing down.  Continue to monitor bowel movements.  Hyponatremia chronic and stable, unlikely to be of clinical significance Recent Labs  Lab 07/21/23 1205 07/22/23 0346 07/23/23 0359  NA 131* 131* 133*   Acute metabolic encephalopathy Lethargic on admission due to combination of UTI, malignancy, dehydration, low electrolytes. Gradually improving but still slow to respond.  Per POA, patient has been physically and mentally declining for the last few weeks. Continue to monitor mental status change  Atrial fibrillation continue Lopressor and Cardizem It seems, he was on Xarelto in the past which was appropriately stopped because of persistent hematuria   Iron deficiency anemia stable, continue iron supplementation Recent Labs    06/29/23 0309 07/02/23 0524 07/21/23 1205 07/22/23 0346 07/23/23 0359  HGB 8.2* 8.2* 9.6* 7.8* 9.0*  MCV 92.7 90.8 92.3 93.7 91.9   Hypertension continue losartan    Mobility: PT eval pending  Goals of care   Code Status: DNR.  Palliative care consulted for goals of care    DVT prophylaxis:  SCDs Start: 07/21/23 1446   Antimicrobials: IV Rocephin Fluid: NS at 7ml/hr to continue. Consultants: Urology, palliative care Family Communication: friend/POA Consuella Lose not at bedside  Status: Inpatient Level of care:  Med-Surg   [facility] Anticipated d/c to: Pending clinical course Needs to continue in-hospital care:  Continues to have diarrhea, altered  mental status Pending palliative care consult.   Diet:  Diet Order             Diet regular Room service appropriate? Yes; Fluid consistency: Thin  Diet effective now                   Scheduled Meds:  atorvastatin  40 mg Oral Daily   Chlorhexidine Gluconate Cloth  6 each Topical Daily   cholestyramine light  4 g Oral BID   cycloSPORINE  1 drop Both Eyes BID   [START ON 07/25/2023] diltiazem  120 mg Oral Daily   escitalopram  20 mg Oral Daily   ezetimibe  10 mg Oral Daily   feeding supplement  237 mL Oral BID BM   ferrous sulfate  325 mg Oral Q breakfast   Gerhardt's butt cream   Topical TID   latanoprost  1 drop Both Eyes QHS   loperamide  2 mg Oral Q6H   metoprolol tartrate  12.5 mg Oral BID   mometasone-formoterol  2 puff Inhalation BID   montelukast  10 mg Oral QHS   mupirocin ointment  1 Application Nasal BID   pantoprazole  40 mg Oral Daily   sulfaSALAzine  1,000 mg Oral QID    PRN meds: acetaminophen **OR** acetaminophen, albuterol, mouth rinse   Infusions:   sodium chloride 75 mL/hr at 07/23/23 1834   cefTRIAXone (ROCEPHIN)  IV 0 g (07/22/23 1302)    Antimicrobials: Anti-infectives (From admission, onward)    Start     Dose/Rate Route Frequency Ordered Stop   07/22/23 1200  cefTRIAXone (ROCEPHIN) 1 g in sodium chloride 0.9 % 100 mL IVPB        1 g 200 mL/hr over 30 Minutes Intravenous Every 24 hours 07/21/23 1447     07/21/23 1200  cefTRIAXone (ROCEPHIN) 2 g in sodium chloride 0.9 % 100 mL IVPB  Status:  Discontinued        2 g 200 mL/hr over 30 Minutes Intravenous Every 24 hours 07/21/23 1155 07/21/23 1447       Nutritional status:  Body mass index is 33.04 kg/m.          Objective: Vitals:   07/24/23 0812 07/24/23 0955  BP:  106/62  Pulse:  77  Resp: 17   Temp:    SpO2: 95%     Intake/Output Summary (Last 24 hours) at 07/24/2023 1133 Last data filed at 07/24/2023 0934 Gross per 24 hour  Intake 6606.35 ml  Output 9850 ml  Net  -3243.65 ml   Filed Weights   07/21/23 1530 07/23/23 0645  Weight: 84.6 kg 84.6 kg   Weight change:  Body mass index is 33.04 kg/m.   Physical Exam: General exam: Pleasant elderly Caucasian male.  Not in pain Skin: No rashes, lesions or ulcers. HEENT: Atraumatic, normocephalic, no obvious bleeding Lungs: Clear to auscultation bilaterally CVS: Regular rate and rhythm, no murmur GI/Abd soft, nontender, nondistended, bowel sound present CNS: More awake and alert.  Remains slow to respond. Psychiatry: Sad affect Extremities: No pedal edema, no calf tenderness  Data Review: I have personally reviewed the laboratory data and studies available.  F/u labs ordered Unresulted Labs (From admission, onward)     Start     Ordered   07/25/23 0500  Basic metabolic panel  Tomorrow morning,   R  Question:  Specimen collection method  Answer:  Lab=Lab collect   07/24/23 0736   07/25/23 0500  CBC with Differential/Platelet  Tomorrow morning,   R       Question:  Specimen collection method  Answer:  Lab=Lab collect   07/24/23 0736            Total time spent in review of labs and imaging, patient evaluation, formulation of plan, documentation and communication with family: 45 minutes  Signed, Lorin Glass, MD [facility] 07/24/2023

## 2023-07-24 NOTE — Evaluation (Signed)
Physical Therapy Evaluation Patient Details Name: Barry [name] MRN: [id number] DOB: 07-10-1946 Today's Date: 07/24/2023  History of Present Illness  77 yo male admitted with UTI, AMS. S/P TURBT 07/23/23. Hx of TURBT 06/28/23. Afib, CAD, obesity, severe OA, UC, CABG, paralyzed hemidiaphragm, falls  Clinical Impression  Bed level eval only. Pt politely declined sitting EOB on today. Pt has severe arthritis affecting his UEs and LEs. Total A +2 for repositioning in bed. Pt reports he has not been working with therapy at [facility]. Per chart review, he has been minimally mobile/requiring +2 A (visitor reports "since fall"-admitted for fall back in 05/2023). Anticipate pt will need to return to SNF once medically ready.       If plan is discharge home, recommend the following: Two people to help with walking and/or transfers;Two people to help with bathing/dressing/bathroom;Help with stairs or ramp for entrance;Assist for transportation;Assistance with cooking/housework;Direct supervision/assist for financial management   Can travel by private vehicle   No    Equipment Recommendations None recommended by PT  Recommendations for Other Services       Functional Status Assessment Patient has not had a recent decline in their functional status     Precautions / Restrictions Precautions Precautions: Fall Precaution Comments: limited bil knee flexion; severely arthritic Restrictions Weight Bearing Restrictions: No      Mobility  Bed Mobility               General bed mobility comments: Total A +2 for repositioning in bed. Pt politely declined sitting EOB    Transfers                        Ambulation/Gait                  Stairs            Wheelchair Mobility     Tilt Bed    Modified Rankin (Stroke Patients Only)       Balance                                             Pertinent Vitals/Pain Pain Assessment Pain Assessment:  No/denies pain    Home Living Family/patient expects to be discharged to:: Skilled nursing facility                   Additional Comments: pt reports sleeping in lift chair.    Prior Function Prior Level of Function : Needs assist;History of Falls (last six months)             Mobility Comments: minimal mobility per patient. has been at [facility]. ADLs Comments: needs assist     Extremity/Trunk Assessment   Upper Extremity Assessment Upper Extremity Assessment: Generalized weakness (severe arthritis)    Lower Extremity Assessment Lower Extremity Assessment: Generalized weakness (severe arthritis) RLE Deficits / Details: poor knee flexion. Knee ext at leat 3/5, genu valgus of knees; external roation of foot; severe arthritis LLE Deficits / Details: poor knee flexion. Knee ext at leat 3/5, genu valgus of knees; exterbak rotaiton of feet; severe arthritis    Cervical / Trunk Assessment Cervical / Trunk Assessment: Kyphotic  Communication   Communication Communication: No apparent difficulties  Cognition Arousal: Alert Behavior During Therapy: WFL for tasks assessed/performed Overall Cognitive Status: Impaired/Different from baseline Area of Impairment: Memory, Following commands,  Problem solving                     Memory: Decreased short-term memory Following Commands: Follows one step commands with increased time     Problem Solving: Requires verbal cues, Requires tactile cues          General Comments      Exercises     Assessment/Plan    PT Assessment All further PT needs can be met in the next venue of care  PT Problem List Decreased strength;Decreased range of motion;Decreased activity tolerance;Decreased balance;Decreased mobility;Decreased knowledge of use of DME;Pain       PT Treatment Interventions DME instruction;Gait training;Functional mobility training;Therapeutic activities;Therapeutic exercise;Balance training;Neuromuscular  re-education;Patient/family education    PT Goals (Current goals can be found in the Care Plan section)  Acute Rehab PT Goals Patient Stated Goal: per friend, meet with MD, palliative care to discuss plan PT Goal Formulation: With patient/family Time For Goal Achievement: 08/07/23 Potential to Achieve Goals: Poor    Frequency Min 1X/week     Co-evaluation               AM-PAC PT "6 Clicks" Mobility  Outcome Measure Help needed turning from your back to your side while in a flat bed without using bedrails?: Total Help needed moving from lying on your back to sitting on the side of a flat bed without using bedrails?: Total Help needed moving to and from a bed to a chair (including a wheelchair)?: Total Help needed standing up from a chair using your arms (e.g., wheelchair or bedside chair)?: Total Help needed to walk in hospital room?: Total Help needed climbing 3-5 steps with a railing? : Total 6 Click Score: 6    End of Session   Activity Tolerance: Patient tolerated treatment well Patient left: with call bell/phone within reach;with bed alarm set;with family/visitor present   PT Visit Diagnosis: Other abnormalities of gait and mobility (R26.89);Muscle weakness (generalized) (M62.81);Pain;History of falling (Z91.81)    Time: 4098-1191 PT Time Calculation (min) (ACUTE ONLY): 18 min   Charges:   PT Evaluation $PT Eval Low Complexity: 1 Low   PT General Charges $$ ACUTE PT VISIT: 1 Visit            Faye Ramsay, PT Acute Rehabilitation  Office: [phone]

## 2023-07-25 DIAGNOSIS — C689 Malignant neoplasm of urinary organ, unspecified: Secondary | ICD-10-CM | POA: Diagnosis not present

## 2023-07-25 DIAGNOSIS — N390 Urinary tract infection, site not specified: Secondary | ICD-10-CM | POA: Diagnosis not present

## 2023-07-25 DIAGNOSIS — Z7189 Other specified counseling: Secondary | ICD-10-CM | POA: Diagnosis not present

## 2023-07-25 DIAGNOSIS — T83511A Infection and inflammatory reaction due to indwelling urethral catheter, initial encounter: Secondary | ICD-10-CM | POA: Diagnosis not present

## 2023-07-25 MED ORDER — SACCHAROMYCES BOULARDII 250 MG PO CAPS
250.0000 mg | ORAL_CAPSULE | Freq: Two times a day (BID) | ORAL | Status: DC
  Administered 2023-07-25 – 2023-08-03 (×19): 250 mg via ORAL
  Filled 2023-07-25 (×19): qty 1

## 2023-07-25 MED ORDER — ACETAMINOPHEN 500 MG PO TABS
1000.0000 mg | ORAL_TABLET | Freq: Three times a day (TID) | ORAL | Status: DC
  Administered 2023-07-25 – 2023-08-03 (×27): 1000 mg via ORAL
  Filled 2023-07-25 (×27): qty 2

## 2023-07-25 MED ORDER — DICLOFENAC SODIUM 1 % EX GEL
2.0000 g | Freq: Four times a day (QID) | CUTANEOUS | Status: DC
  Administered 2023-07-25 – 2023-08-03 (×34): 2 g via TOPICAL
  Filled 2023-07-25 (×2): qty 100

## 2023-07-25 MED ORDER — CEFADROXIL 500 MG PO CAPS
1000.0000 mg | ORAL_CAPSULE | Freq: Two times a day (BID) | ORAL | Status: DC
  Administered 2023-07-25 – 2023-07-31 (×13): 1000 mg via ORAL
  Filled 2023-07-25 (×13): qty 2

## 2023-07-25 NOTE — Consult Note (Signed)
Consultation Note Date: 07/25/2023   Patient Name: Barry [name]  DOB: [birth year]  MRN: [id number]  Age / Sex: 77 y.o., male  PCP: Georgann Housekeeper, MD Referring Physician: Lorin Glass, MD  Reason for Consultation: GOC    HPI/Patient Profile: 77 y.o. male  with past medical history of  GERD, ulcerative colitis, a-fib, ashtma, CAD, GERD, glaucoma, HLD, HTN, obesity, hemidiaphragm paralysis, osteoarthritis, s/p TURBT on 06/28/23 for extensive T1 high-grade urothelial carcinoma- incomplete resection, admitted on 07/21/2023 with altered mental status. Urine culture positive for E. Coli. 8/6 underwent cystoscopy with transurethral resection of greater than 5cm tumor with goal of decreasing hematuria. Tumor was found to be friable and complete resection was not achieved. Per urology he is not a candidate for further interventions. Palliative medicine consulted for GOC.   Primary Decision Maker HCPOA - Verner Mould  Discussion: Chart reviewed including labs, progress notes, imaging from this and previous encounters.  Spoke with Consuella Lose via phone and Azad in person.  Consuella Lose has been helping Eustacio for over 20 years. They met when Consuella Lose assisted Bodie in getting his cat Annia Friendly and she has been helping him with housing and daily life every since. She is his dedicated HCPOA and durable POA- HCPOA is on chart.  Hollace is oriented, although child- like.  We discussed Zyheir's bladder cancer. He understands that his cancer is incurable and he will likely succumb to it.  His goals of care are to be as comfortable as possible. He doesn't want to be pushed to participate in physical therapy. He tells me he is agreeable to going back to [facility], but he doesn't want to be on the side where they make him do exercise. He wants to be left alone in his bed, eat and drink when and if he wants, and not be in pain, even if that  means he gets weaker over time and eventually dies. He does not want to return to the hospital.  I discussed with Consuella Lose the difficult circumstance of disposition. Hospice was discussed. Consuella Lose would like to seek assistance from hospice and TOC to help with disposition.  Symptomatically- Abraham reports pain in his leg and knees, and ongoing bothersome diarrhea.      SUMMARY OF RECOMMENDATIONS -GOC- primarily comfort, would like to discharge with hospice -Disposition may be an issue- pt agrees to go back to [facility], but doesn't want to do rehab -Symptom management-  -Tylenol 650mg  TID -Diclofenac gel to knees and ankles QID -Florastor 40mg  po daily    Code Status/Advance Care Planning: DNR   Prognosis:   < 6 months  Discharge Planning: To Be Determined  Primary Diagnoses: Present on Admission:  UTI (urinary tract infection)   Review of Systems  Physical Exam  Vital Signs: BP 110/63 (BP Location: Left Arm)   Pulse 89   Temp 97.7 F (36.5 C) (Oral)   Resp 18   Ht 5\' 3"  (1.6 m)   Wt 84.6 kg   SpO2 93%   BMI 33.04 kg/m  Pain  Scale: 0-10   Pain Score: 0-No pain   SpO2: SpO2: 93 % O2 Device:SpO2: 93 % O2 Flow Rate: .O2 Flow Rate (L/min): 4 L/min  IO: Intake/output summary:  Intake/Output Summary (Last 24 hours) at 07/25/2023 1215 Last data filed at 07/25/2023 1000 Gross per 24 hour  Intake 480 ml  Output 950 ml  Net -470 ml    LBM: Last BM Date : 07/24/23 Baseline Weight: Weight: 84.6 kg Most recent weight: Weight: 84.6 kg       Thank you for this consult. Palliative medicine will continue to follow and assist as needed.  Time Total: 90 minutes Greater than 50%  of this time was spent counseling and coordinating care related to the above assessment and plan.  Signed by: Ocie Bob, AGNP-C Palliative Medicine    Please contact Palliative Medicine Team phone at [phone] for questions and concerns.  For individual provider: See  Loretha Stapler

## 2023-07-25 NOTE — Progress Notes (Signed)
[facility] Liaison Note  Referral received for patient/family interest in hospice at LTC. No facility selected at this time.   [facility] continuing to following for discharge disposition for hospice services at LTC.    Please call with any questions or concerns. Thank you  Dionicio Stall, LCSW [facility] hospital liaison [phone]

## 2023-07-25 NOTE — TOC Progression Note (Addendum)
Transition of Care [facility]) - Progression Note    Patient Details  Name: Barry [name] MRN: [id number] Date of Birth: [birth year]  [facility]) CM/SW Contact  Howell Rucks, RN Phone Number: 07/25/2023, 10:15 AM  Clinical Narrative:   PT eval completed- PT recommendation for short term rehab- SNF. FL2 completed, faxed out for bed offers. Pt admitted from [facility], preference is to return there. TOC will continue to follow.   -10:52am Call to pt's Consuella Lose (friend on file) left vm informing PT recommendation for short term rehab, will present bed offers when available.   -12:12pm Teams chat receive Ocie Bob, NP with Palliative Medicine Team, reports after discussion with pt and pt's HCPOA Consuella Lose), pt has declined short term rehab, requesting assistance with option for possible LTC with hospice. NCM outreached to [facility], discussed dc plan with TOC CSW as well. Consuella Lose confirmed pt only has Medicare A/B coverage at this time but she is working with someone at [facility] for LTC placement and is evaluating pt's assets to qualify for [employer]. TOC CSW explained that pt will need to pay upfront for LTC until patient qualifies for Medicaid which could take 45-60 days. CSW suggested home with Hospice. Consuella Lose reports pt is unable to care for himself and needs 24/7 care due to his medical conditions. TOC CSW discussed option for personal care services and areferral to [facility], agency assist with senior care services, such as [facility]. Consuella Lose agreeable to referral. NCM will initiate referral. NCM will provide Consuella Lose with a list of Private Duty Care Agencies for home personal care services. Referral to [facility], rep-Shanita, states will follow for dc disposition.  -12:31pm NCM called to Whitney with [facility], vm left with NCM name and phone number requesting call back.   -12:32pm Teams chat received from attending inquiring if pt qualifies for residential hospice, Kasie  with PMT and [place] with [facility] confirmed pt does no meet criteria for residential hospice.   -1:41pm Call from [facility] with [facility], discussed dc planning for pt. Advise pt's HCPOA Consuella Lose)  go in person to [facility] office to initial application for Medicaid and obtain information on how to best spend down pt's assets. Per Alphonzo Lemmings, agency will be able to offer some services after that process has been completed.  NCM will meet with Consuella Lose at bedside tomorrow.   Expected Discharge Plan: Skilled Nursing Facility Barriers to Discharge: Continued Medical Work up  Expected Discharge Plan and Services       Living arrangements for the past 2 months: Apartment                                       Social Determinants of Health (SDOH) Interventions SDOH Screenings   Food Insecurity: No Food Insecurity (07/21/2023)  Housing: Low Risk  (07/21/2023)  Transportation Needs: No Transportation Needs (07/21/2023)  Utilities: Not At Risk (07/21/2023)  Tobacco Use: Low Risk  (07/23/2023)    Readmission Risk Interventions    07/22/2023    1:05 PM  Readmission Risk Prevention Plan  Transportation Screening Complete  PCP or Specialist Appt within 5-7 Days Complete  Home Care Screening Complete  Medication Review (RN CM) Complete

## 2023-07-25 NOTE — Progress Notes (Addendum)
2 Days Post-Op Subjective: Patient reports feeling "pretty good".   Objective: Vital signs in last 24 hours: Temp:  [97.7 F (36.5 C)-99.2 F (37.3 C)] 97.7 F (36.5 C) (08/08 0601) Pulse Rate:  [70-89] 89 (08/08 0803) Resp:  [16-20] 18 (08/08 0803) BP: (102-113)/(59-63) 110/63 (08/08 0601) SpO2:  [93 %-96 %] 93 % (08/08 0803)  Intake/Output from previous day: 08/07 0701 - 08/08 0700 In: 240 [P.O.:240] Out: 1600 [Urine:1600] Intake/Output this shift: Total I/O In: 480 [P.O.:480] Out: -   Physical Exam:  General:alert and cooperative GI: soft Foley in place with dark yellow urine in bag  Lab Results: Recent Labs    07/23/23 0359 07/25/23 0431  HGB 9.0* 7.6*  HCT 30.8* 26.0*   BMET Recent Labs    07/23/23 0359 07/25/23 0431  NA 133* 135  K 3.5 3.5  CL 104 105  CO2 20* 20*  GLUCOSE 96 97  BUN 15 12  CREATININE 0.58* 0.58*  CALCIUM 8.7* 7.9*   No results for input(s): "LABPT", "INR" in the last 72 hours. No results for input(s): "LABURIN" in the last 72 hours. Results for orders placed or performed during the hospital encounter of 07/21/23  Blood Culture (routine x 2)     Status: None (Preliminary result)   Collection Time: 07/21/23 12:05 PM   Specimen: BLOOD  Result Value Ref Range Status   Specimen Description   Final    BLOOD BLOOD RIGHT HAND Performed at [facility], [address] [place], [place] [address]    Special Requests   Final    BOTTLES DRAWN AEROBIC AND ANAEROBIC Blood Culture adequate volume Performed at [facility], [address], [place], [place] [address]    Culture   Final    NO GROWTH 4 DAYS Performed at [facility], [address], [place], [place] [address]    Report Status PENDING  Incomplete  Blood Culture (routine x 2)     Status: None (Preliminary result)   Collection Time: 07/21/23 12:18 PM   Specimen: BLOOD  Result Value Ref Range Status   Specimen Description   Final     BLOOD RIGHT ANTECUBITAL Performed at [facility], [address], [place], [place] [address]    Special Requests   Final    BOTTLES DRAWN AEROBIC AND ANAEROBIC Blood Culture adequate volume Performed at [facility], [address], [place], [place] [address]    Culture   Final    NO GROWTH 4 DAYS Performed at [facility], [address], [place], [place] [address]    Report Status PENDING  Incomplete  Urine Culture     Status: Abnormal   Collection Time: 07/21/23 12:35 PM   Specimen: Urine, Random  Result Value Ref Range Status   Specimen Description   Final    URINE, RANDOM Performed at [facility], [address] [place], [place] [address]    Special Requests   Final    NONE Reflexed from [id number] Performed at [facility], [address] [place], [place], [place] [address]    Culture >=100,000 COLONIES/mL ESCHERICHIA COLI (A)  Final   Report Status 07/23/2023 FINAL  Final   Organism ID, Bacteria ESCHERICHIA COLI (A)  Final      Susceptibility   Escherichia coli - MIC*    AMPICILLIN 4 SENSITIVE Sensitive     CEFAZOLIN <=4 SENSITIVE Sensitive     CEFEPIME <=0.12 SENSITIVE Sensitive  CEFTRIAXONE <=0.25 SENSITIVE Sensitive     CIPROFLOXACIN <=0.25 SENSITIVE Sensitive     GENTAMICIN >=16 RESISTANT Resistant     IMIPENEM <=0.25 SENSITIVE Sensitive     NITROFURANTOIN <=16 SENSITIVE Sensitive     TRIMETH/SULFA <=20 SENSITIVE Sensitive     AMPICILLIN/SULBACTAM <=2 SENSITIVE Sensitive     PIP/TAZO <=4 SENSITIVE Sensitive     * >=100,000 COLONIES/mL ESCHERICHIA COLI  Resp panel by RT-PCR (RSV, Flu A&B, Covid) Anterior Nasal Swab     Status: None   Collection Time: 07/21/23  2:14 PM   Specimen: Anterior Nasal Swab  Result Value Ref Range Status   SARS Coronavirus 2 by RT PCR NEGATIVE NEGATIVE Final    Comment: (NOTE) SARS-CoV-2 target nucleic acids are NOT DETECTED.  The SARS-CoV-2 RNA is  generally detectable in upper respiratory specimens during the acute phase of infection. The lowest concentration of SARS-CoV-2 viral copies this assay can detect is 138 copies/mL. A negative result does not preclude SARS-Cov-2 infection and should not be used as the sole basis for treatment or other patient management decisions. A negative result may occur with  improper specimen collection/handling, submission of specimen other than nasopharyngeal swab, presence of viral mutation(s) within the areas targeted by this assay, and inadequate number of viral copies(<138 copies/mL). A negative result must be combined with clinical observations, patient history, and epidemiological information. The expected result is Negative.  Fact Sheet for Patients:  BloggerCourse.com  Fact Sheet for Healthcare Providers:  SeriousBroker.it  This test is no t yet approved or cleared by the [place] FDA and  has been authorized for detection and/or diagnosis of SARS-CoV-2 by FDA under an Emergency Use Authorization (EUA). This EUA will remain  in effect (meaning this test can be used) for the duration of the COVID-19 declaration under Section 564(b)(1) of the Act, 21 U.S.C.section 360bbb-3(b)(1), unless the authorization is terminated  or revoked sooner.       Influenza A by PCR NEGATIVE NEGATIVE Final   Influenza B by PCR NEGATIVE NEGATIVE Final    Comment: (NOTE) The Xpert Xpress SARS-CoV-2/FLU/RSV plus assay is intended as an aid in the diagnosis of influenza from Nasopharyngeal swab specimens and should not be used as a sole basis for treatment. Nasal washings and aspirates are unacceptable for Xpert Xpress SARS-CoV-2/FLU/RSV testing.  Fact Sheet for Patients: BloggerCourse.com  Fact Sheet for Healthcare Providers: SeriousBroker.it  This test is not yet approved or cleared by the [place] FDA and has been authorized for detection and/or diagnosis of SARS-CoV-2 by FDA under an Emergency Use Authorization (EUA). This EUA will remain in effect (meaning this test can be used) for the duration of the COVID-19 declaration under Section 564(b)(1) of the Act, 21 U.S.C. section 360bbb-3(b)(1), unless the authorization is terminated or revoked.     Resp Syncytial Virus by PCR NEGATIVE NEGATIVE Final    Comment: (NOTE) Fact Sheet for Patients: BloggerCourse.com  Fact Sheet for Healthcare Providers: SeriousBroker.it  This test is not yet approved or cleared by the [place] FDA and has been authorized for detection and/or diagnosis of SARS-CoV-2 by FDA under an Emergency Use Authorization (EUA). This EUA will remain in effect (meaning this test can be used) for the duration of the COVID-19 declaration under Section 564(b)(1) of the Act, 21 U.S.C. section 360bbb-3(b)(1), unless the authorization is terminated or revoked.  Performed at [facility], [address] [place], [place] [address]   C Difficile Quick Screen w PCR reflex  Status: None   Collection Time: 07/22/23  6:12 AM   Specimen: STOOL  Result Value Ref Range Status   C Diff antigen NEGATIVE NEGATIVE Final   C Diff toxin NEGATIVE NEGATIVE Final   C Diff interpretation No C. difficile detected.  Final    Comment: Performed at [facility], [address], [place], [place] [address]  Surgical PCR screen     Status: Abnormal   Collection Time: 07/22/23  6:12 AM   Specimen: STOOL; Nasal Swab  Result Value Ref Range Status   MRSA, PCR NEGATIVE NEGATIVE Final   Staphylococcus aureus POSITIVE (A) NEGATIVE Final    Comment: (NOTE) The Xpert SA Assay (FDA approved for NASAL specimens in patients 2 years of age and older), is one component of a comprehensive surveillance program. It is not intended to diagnose  infection nor to guide or monitor treatment. Performed at [facility], [address] [place], [place] [address]     Studies/Results: No results found.  Assessment/Plan: 2 Days Post-Op Procedure(s) (LRB): TRANSURETHRAL RESECTION OF BLADDER TUMOR (TURBT) (N/A) Doing well off CBI Per request of IM will leave foley in place as pt is going to be discharged with hospice Anemia per primary team   LOS: 4 days   Harrie Foreman 07/25/2023, 11:10 AM

## 2023-07-25 NOTE — Progress Notes (Signed)
PROGRESS NOTE  Barry [name]  DOB: [birth year]  PCP: Georgann Housekeeper, MD WJX:[id number]  DOA: 07/21/2023  LOS: 4 days  Hospital Day: 5  Brief narrative: Barry [name] is a 77 y.o. male with PMH significant for obesity, HTN, HLD, A-fib on anticoagulation, CAD/CABG,  GERD, ulcerative colitis, chronic anemia, asthma, bladder cancer under the care of urology Dr. Annabell Howells. Recently underwent partial resection of a large bladder tumor in July and was scheduled for second stage on 07/23/2023. After his surgery in July, he was discharged to adult family for rehab but apparently patient has physical and mental declined and mostly remained bedbound for last several days.  8/4, patient presented to the ED with complaint altered mental status, progressively worsening for few days.  In the ED, patient was tachycardic in A-fib Labs with normal WBC count, hemoglobin at baseline Urinalysis showed turbid amber-colored urine with moderate hemoglobin with large leukocytes, many bacteria Patient was started on IV Rocephin Admitted to [facility] Urology consulted Urine culture grew E. coli.  Subjective: Patient was seen and examined this morning.  Pleasant elderly Caucasian male.  Lying down in bed.  Mental status improving.  Palliative care consult appreciated.  Assessment and plan: UTI associated with Foley catheter Urinalysis showed turbid amber-colored urine with moderate hemoglobin with large leukocytes, many bacteria Urine culture pending. Currently on IV Rocephin  Progressive urothelial carcinoma bladder S/p partial resection July as well as repeat resection today 8/6 - Dr. Annabell Howells Per operative note, patient had extensive urothelial carcinoma.  The total area of resection exceeded 5 cm and probably unable to remove the entire tumor bulk.  Hematuria catheter was irrigated and CBI started. CBI has been stopped.  Hematuria resolved.  Discussed with urology.  We decided to leave the Foley in place given  patient's overall poor clinical condition, large tumor burden at risk of bleeding and direction of hospice care.  Chronic diarrhea History of ulcerative colitis Has history of ulcerative colitis and follows up with GI Dr. Levora Angel.  He is on sulfasalazine despite which was last 7 days, patient had significant diarrhea without blood Last CT abdomen from June 2024 did not show any evidence of active lesions. Currently on sulfasalazine, Questran.  Also on Imodium scheduled since 8/6. C. difficile and GI pathogen panel negative Per documentation only had 2 bowel movements in last 24 hours.  Hyponatremia chronic and stable, unlikely to be of clinical significance Recent Labs  Lab 07/21/23 1205 07/22/23 0346 07/23/23 0359 07/25/23 0431  NA 131* 131* 133* 135   Acute metabolic encephalopathy Lethargic on admission due to combination of UTI, malignancy, dehydration, low electrolytes. Gradually improving but still slow to respond.  Per POA, patient has been physically and mentally declining for the last few weeks. Mental status gradually improving.  Atrial fibrillation continue Lopressor and Cardizem It seems, he was on Xarelto in the past which was appropriately stopped because of persistent hematuria   Iron deficiency anemia stable, continue iron supplementation Recent Labs    07/02/23 0524 07/21/23 1205 07/22/23 0346 07/23/23 0359 07/25/23 0431  HGB 8.2* 9.6* 7.8* 9.0* 7.6*  MCV 90.8 92.3 93.7 91.9 94.9   Hypertension continue losartan   Goals of care   Code Status: DNR.  Palliative care consulted for goals of care.  Home hospice likely    DVT prophylaxis:  SCDs Start: 07/21/23 1446   Antimicrobials: IV Rocephin Fluid: Stop IV fluid today Consultants: Urology, palliative care Family Communication: friend/POA Consuella Lose not at bedside  Status: Inpatient Level of  care:  Med-Surg   Patient from: [facility] short-term rehab Anticipated d/c to: Home hospice  likely.   Diet:  Diet Order             Diet regular Room service appropriate? Yes; Fluid consistency: Thin  Diet effective now                   Scheduled Meds:  acetaminophen  1,000 mg Oral TID   atorvastatin  40 mg Oral Daily   Chlorhexidine Gluconate Cloth  6 each Topical Daily   cholestyramine light  4 g Oral BID   cycloSPORINE  1 drop Both Eyes BID   diclofenac Sodium  2 g Topical QID   diltiazem  120 mg Oral Daily   escitalopram  20 mg Oral Daily   ezetimibe  10 mg Oral Daily   feeding supplement  237 mL Oral BID BM   ferrous sulfate  325 mg Oral Q breakfast   Gerhardt's butt cream   Topical TID   latanoprost  1 drop Both Eyes QHS   loperamide  2 mg Oral Q6H   metoprolol tartrate  12.5 mg Oral BID   mometasone-formoterol  2 puff Inhalation BID   montelukast  10 mg Oral QHS   mupirocin ointment  1 Application Nasal BID   pantoprazole  40 mg Oral Daily   saccharomyces boulardii  250 mg Oral BID   sulfaSALAzine  1,000 mg Oral QID    PRN meds: acetaminophen **OR** acetaminophen, albuterol, mouth rinse   Infusions:   sodium chloride 75 mL/hr at 07/24/23 1851   cefTRIAXone (ROCEPHIN)  IV 1 g (07/24/23 1236)    Antimicrobials: Anti-infectives (From admission, onward)    Start     Dose/Rate Route Frequency Ordered Stop   07/22/23 1200  cefTRIAXone (ROCEPHIN) 1 g in sodium chloride 0.9 % 100 mL IVPB        1 g 200 mL/hr over 30 Minutes Intravenous Every 24 hours 07/21/23 1447     07/21/23 1200  cefTRIAXone (ROCEPHIN) 2 g in sodium chloride 0.9 % 100 mL IVPB  Status:  Discontinued        2 g 200 mL/hr over 30 Minutes Intravenous Every 24 hours 07/21/23 1155 07/21/23 1447       Nutritional status:  Body mass index is 33.04 kg/m.  Nutrition Problem: Unintentional weight loss Etiology: chronic illness, acute illness Signs/Symptoms: percent weight loss (11% in ~2 months) Percent weight loss: 11 % (in ~2 months)     Objective: Vitals:   07/25/23 0601  07/25/23 0803  BP: 110/63   Pulse: 84 89  Resp: 16 18  Temp: 97.7 F (36.5 C)   SpO2: 95% 93%    Intake/Output Summary (Last 24 hours) at 07/25/2023 1226 Last data filed at 07/25/2023 1000 Gross per 24 hour  Intake 480 ml  Output 950 ml  Net -470 ml   Filed Weights   07/21/23 1530 07/23/23 0645  Weight: 84.6 kg 84.6 kg   Weight change:  Body mass index is 33.04 kg/m.   Physical Exam: General exam: Pleasant elderly Caucasian male.  Not in pain.  Foley catheter with clear urine. Skin: No rashes, lesions or ulcers. HEENT: Atraumatic, normocephalic, no obvious bleeding Lungs: Clear to auscultation bilaterally CVS: Regular rate and rhythm, no murmur GI/Abd soft, nontender, nondistended, bowel sound present CNS: Mental status gradually improving. Psychiatry: Sad affect Extremities: No pedal edema, no calf tenderness  Data Review: I have personally reviewed the laboratory data  and studies available.  F/u labs ordered Unresulted Labs (From admission, onward)     Start     Ordered   07/26/23 0500  CBC with Differential/Platelet  Daily,   R     Question:  Specimen collection method  Answer:  Lab=Lab collect   07/25/23 0758   07/26/23 0500  Basic metabolic panel  Daily,   R     Question:  Specimen collection method  Answer:  Lab=Lab collect   07/25/23 0758            Total time spent in review of labs and imaging, patient evaluation, formulation of plan, documentation and communication with family: 45 minutes  Signed, Lorin Glass, MD [facility] 07/25/2023

## 2023-07-25 NOTE — Plan of Care (Signed)
  Problem: Clinical Measurements: Goal: Respiratory complications will improve Outcome: Progressing   Problem: Nutrition: Goal: Adequate nutrition will be maintained Outcome: Progressing   Problem: Elimination: Goal: Will not experience complications related to bowel motility Outcome: Progressing   Problem: Pain Managment: Goal: General experience of comfort will improve Outcome: Progressing   Problem: Safety: Goal: Ability to remain free from injury will improve Outcome: Progressing   Problem: Skin Integrity: Goal: Risk for impaired skin integrity will decrease Outcome: Progressing

## 2023-07-26 DIAGNOSIS — N390 Urinary tract infection, site not specified: Secondary | ICD-10-CM | POA: Diagnosis not present

## 2023-07-26 DIAGNOSIS — T83511A Infection and inflammatory reaction due to indwelling urethral catheter, initial encounter: Secondary | ICD-10-CM | POA: Diagnosis not present

## 2023-07-26 MED ORDER — LOPERAMIDE HCL 2 MG PO CAPS
2.0000 mg | ORAL_CAPSULE | Freq: Three times a day (TID) | ORAL | Status: DC
  Administered 2023-07-26 – 2023-07-27 (×4): 2 mg via ORAL
  Filled 2023-07-26 (×4): qty 1

## 2023-07-26 MED ORDER — POTASSIUM CHLORIDE CRYS ER 20 MEQ PO TBCR
40.0000 meq | EXTENDED_RELEASE_TABLET | Freq: Once | ORAL | Status: AC
  Administered 2023-07-26: 40 meq via ORAL
  Filled 2023-07-26: qty 2

## 2023-07-26 NOTE — Progress Notes (Signed)
[facility] Liaison Note   [facility] continuing to following for discharge disposition for hospice services at [facility].    Dionicio Stall, [facility] Liaison [phone]

## 2023-07-26 NOTE — Progress Notes (Signed)
PROGRESS NOTE  Barry [name]  DOB: 08-01-1946  PCP: Georgann Housekeeper, MD NFA:[id number]  DOA: 07/21/2023  LOS: 5 days  Hospital Day: 6  Brief narrative: Barry [name] is a 77 y.o. male with PMH significant for obesity, HTN, HLD, A-fib on anticoagulation, CAD/CABG,  GERD, ulcerative colitis, chronic anemia, asthma, bladder cancer under the care of urology Dr. Annabell Howells. Recently underwent partial resection of a large bladder tumor in July and was scheduled for second stage on 07/23/2023. After his surgery in July, he was discharged to adult family for rehab but apparently patient has physical and mental declined and mostly remained bedbound for last several days.  8/4, patient presented to the ED with complaint altered mental status, progressively worsening for few days.  In the ED, patient was tachycardic in A-fib Labs with normal WBC count, hemoglobin at baseline Urinalysis showed turbid amber-colored urine with moderate hemoglobin with large leukocytes, many bacteria Patient was started on IV Rocephin Admitted to [facility] Urology consulted Urine culture grew E. coli.  Subjective: Patient was seen and examined this morning.  Propped up in bed.  Not in distress.  More awake and conversational today.  His friend/POA Consuella Lose is at bedside.  Foley catheter with clear urine.  Assessment and plan: UTI associated with Foley catheter Urinalysis showed turbid amber-colored urine with moderate hemoglobin with large leukocytes, many bacteria Urine culture pending. Initially started on IV Rocephin.  Subsequent switched to oral cefadroxil  Progressive urothelial carcinoma bladder S/p partial resection July as well as repeat resection today 8/6 - Dr. Annabell Howells Per operative note, patient had extensive urothelial carcinoma.  The total area of resection exceeded 5 cm and probably unable to remove the entire tumor bulk.  Hematuria catheter was irrigated and CBI started. CBI has been stopped.  Hematuria resolved.    8/8, I discussed with urology.  We decided to leave the Foley in place given patient's overall poor clinical condition, large tumor burden at risk of bleeding and direction of hospice care.  Chronic diarrhea History of ulcerative colitis Has history of ulcerative colitis and follows up with GI Dr. Levora Angel.  He is on sulfasalazine despite which was last 7 days, patient had significant diarrhea without blood Last CT abdomen from June 2024 did not show any evidence of active lesions. Currently on sulfasalazine, Questran.  Also on Imodium scheduled since 8/6. C. difficile and GI pathogen panel negative Per documentation only had 2 bowel movements in last 24 hours.  Hyponatremia chronic and stable, unlikely to be of clinical significance. Recent Labs  Lab 07/21/23 1205 07/22/23 0346 07/23/23 0359 07/25/23 0431 07/26/23 0404  NA 131* 131* 133* 135 135   Acute metabolic encephalopathy Lethargic on admission due to combination of UTI, malignancy, dehydration, low electrolytes. Gradually improving but still slow to respond.  Per POA, patient has been physically and mentally declining for the last few weeks. Mental status much better  Atrial fibrillation Continue Lopressor and Cardizem It seems, he was on Xarelto in the past which was appropriately stopped because of persistent hematuria   Acute on chronic anemia  iron deficiency anemia Hemoglobin trending as below.  No active bleeding though.  Urine looks clear.   Continue iron supplementation Continue monitor. Recent Labs    07/21/23 1205 07/22/23 0346 07/23/23 0359 07/25/23 0431 07/26/23 0404  HGB 9.6* 7.8* 9.0* 7.6* 7.2*  MCV 92.3 93.7 91.9 94.9 95.8   Hypertension continue losartan   Goals of care   Code Status: DNR.  Palliative care consulted for  goals of care.  Home hospice likely    DVT prophylaxis:  SCDs Start: 07/21/23 1446   Antimicrobials: IV Rocephin Fluid: Not on IV hydration Consultants: Urology,  palliative care Family Communication: Discussed with friend/POA Consuella Lose at bedside  Status: Inpatient Level of care:  Med-Surg   Patient from: [facility] short-term re[facility] Anticipated d/c to: Patient has no Medicaid for long-term facility.  Lives at home alone.  Case management aware.   Diet:  Diet Order             Diet regular Room service appropriate? Yes; Fluid consistency: Thin  Diet effective now                   Scheduled Meds:  acetaminophen  1,000 mg Oral TID   atorvastatin  40 mg Oral Daily   cefadroxil  1,000 mg Oral BID   Chlorhexidine Gluconate Cloth  6 each Topical Daily   cholestyramine light  4 g Oral BID   cycloSPORINE  1 drop Both Eyes BID   diclofenac Sodium  2 g Topical QID   diltiazem  120 mg Oral Daily   escitalopram  20 mg Oral Daily   ezetimibe  10 mg Oral Daily   feeding supplement  237 mL Oral BID BM   ferrous sulfate  325 mg Oral Q breakfast   Gerhardt's butt cream   Topical TID   latanoprost  1 drop Both Eyes QHS   loperamide  2 mg Oral Q8H   metoprolol tartrate  12.5 mg Oral BID   mometasone-formoterol  2 puff Inhalation BID   montelukast  10 mg Oral QHS   mupirocin ointment  1 Application Nasal BID   pantoprazole  40 mg Oral Daily   saccharomyces boulardii  250 mg Oral BID   sulfaSALAzine  1,000 mg Oral QID    PRN meds: acetaminophen **OR** acetaminophen, albuterol, mouth rinse   Infusions:     Antimicrobials: Anti-infectives (From admission, onward)    Start     Dose/Rate Route Frequency Ordered Stop   07/25/23 1430  cefadroxil (DURICEF) capsule 1,000 mg        1,000 mg Oral 2 times daily 07/25/23 1335     07/22/23 1200  cefTRIAXone (ROCEPHIN) 1 g in sodium chloride 0.9 % 100 mL IVPB  Status:  Discontinued        1 g 200 mL/hr over 30 Minutes Intravenous Every 24 hours 07/21/23 1447 07/25/23 1335   07/21/23 1200  cefTRIAXone (ROCEPHIN) 2 g in sodium chloride 0.9 % 100 mL IVPB  Status:  Discontinued        2 g 200 mL/hr  over 30 Minutes Intravenous Every 24 hours 07/21/23 1155 07/21/23 1447       Nutritional status:  Body mass index is 33.04 kg/m.  Nutrition Problem: Unintentional weight loss Etiology: chronic illness, acute illness Signs/Symptoms: percent weight loss (11% in ~2 months) Percent weight loss: 11 % (in ~2 months)     Objective: Vitals:   07/26/23 0544 07/26/23 0821  BP: 111/71   Pulse: 77   Resp: 20   Temp: 97.9 F (36.6 C)   SpO2: 95% 94%    Intake/Output Summary (Last 24 hours) at 07/26/2023 1416 Last data filed at 07/26/2023 0929 Gross per 24 hour  Intake 840 ml  Output 800 ml  Net 40 ml   Filed Weights   07/21/23 1530 07/23/23 0645  Weight: 84.6 kg 84.6 kg   Weight change:  Body mass index is  33.04 kg/m.   Physical Exam: General exam: Pleasant elderly Caucasian male.  Not in pain.  Foley catheter with clear urine. Skin: No rashes, lesions or ulcers. HEENT: Atraumatic, normocephalic, no obvious bleeding Lungs: Clear to auscultation bilaterally CVS: Regular rate and rhythm, no murmur GI/Abd soft, nontender, nondistended, bowel sound present CNS: Mental status improved Psychiatry: Mood appropriate Extremities: No pedal edema, no calf tenderness  Data Review: I have personally reviewed the laboratory data and studies available.  F/u labs ordered Unresulted Labs (From admission, onward)     Start     Ordered   07/26/23 0500  CBC with Differential/Platelet  Daily,   R     Question:  Specimen collection method  Answer:  Lab=Lab collect   07/25/23 0758   07/26/23 0500  Basic metabolic panel  Daily,   R     Question:  Specimen collection method  Answer:  Lab=Lab collect   07/25/23 0758            Total time spent in review of labs and imaging, patient evaluation, formulation of plan, documentation and communication with family: 45 minutes  Signed, Lorin Glass, MD [facility] 07/26/2023

## 2023-07-26 NOTE — TOC Progression Note (Signed)
Transition of Care [facility]) - Progression Note    Patient Details  Name: Barry [name] MRN: [id number] Date of Birth: 16-Nov-1946  Transition of Care [facility]) CM/SW Contact  Howell Rucks, RN Phone Number: 07/26/2023, 4:26 PM  Clinical Narrative:   Met with Consuella Lose (pts HCPOA) at bedside to discuss dc planning. NCM called to [employer], per Consuella Lose, she was being assisted with application for Medicaid, sw [place]ward Mallard, placed on speaker phone, per [place], pt was declined for Medicaid, will need assets spundown to $2000 with reapplication to Medicaid at DSS, Consuella Lose verbalized understanding. Per [facility] is offering patient a short term rehab bed, will need $5000 up front, per Consuella Lose, pt does not have $5000 available. Per Consuella Lose, patient returning home with Hospice is not an option because pt needs 24/7 care, paying out of pocket for personal care services is not an option at this time. NCM will consult TOC supervisor for assistance with discharge planning.     Expected Discharge Plan: Skilled Nursing Facility Barriers to Discharge: Continued Medical Work up  Expected Discharge Plan and Services       Living arrangements for the past 2 months: Apartment                                       Social Determinants of Health (SDOH) Interventions SDOH Screenings   Food Insecurity: No Food Insecurity (07/21/2023)  Housing: Low Risk  (07/21/2023)  Transportation Needs: No Transportation Needs (07/21/2023)  Utilities: Not At Risk (07/21/2023)  Tobacco Use: Low Risk  (07/23/2023)    Readmission Risk Interventions    07/22/2023    1:05 PM  Readmission Risk Prevention Plan  Transportation Screening Complete  PCP or Specialist Appt within 5-7 Days Complete  Home Care Screening Complete  Medication Review (RN CM) Complete

## 2023-07-26 NOTE — Plan of Care (Signed)
  Problem: Clinical Measurements: Goal: Will remain free from infection Outcome: Progressing Goal: Respiratory complications will improve Outcome: Progressing   Problem: Nutrition: Goal: Adequate nutrition will be maintained Outcome: Progressing   Problem: Coping: Goal: Level of anxiety will decrease Outcome: Progressing   Problem: Elimination: Goal: Will not experience complications related to bowel motility Outcome: Progressing Goal: Will not experience complications related to urinary retention Outcome: Progressing   Problem: Pain Managment: Goal: General experience of comfort will improve Outcome: Progressing   Problem: Safety: Goal: Ability to remain free from injury will improve Outcome: Progressing   Problem: Skin Integrity: Goal: Risk for impaired skin integrity will decrease Outcome: Progressing

## 2023-07-27 DIAGNOSIS — T83511A Infection and inflammatory reaction due to indwelling urethral catheter, initial encounter: Secondary | ICD-10-CM | POA: Diagnosis not present

## 2023-07-27 DIAGNOSIS — N390 Urinary tract infection, site not specified: Secondary | ICD-10-CM | POA: Diagnosis not present

## 2023-07-27 LAB — CBC WITH DIFFERENTIAL/PLATELET
Abs Immature Granulocytes: 0.04 10*3/uL (ref 0.00–0.07)
Basophils Absolute: 0 10*3/uL (ref 0.0–0.1)
Basophils Relative: 0 %
Eosinophils Absolute: 0.1 10*3/uL (ref 0.0–0.5)
Eosinophils Relative: 2 %
HCT: 26.8 % — ABNORMAL LOW (ref 39.0–52.0)
Hemoglobin: 8.3 g/dL — ABNORMAL LOW (ref 13.0–17.0)
Immature Granulocytes: 1 %
Lymphocytes Relative: 25 %
Lymphs Abs: 1.2 10*3/uL (ref 0.7–4.0)
MCH: 28.6 pg (ref 26.0–34.0)
MCHC: 31 g/dL (ref 30.0–36.0)
MCV: 92.4 fL (ref 80.0–100.0)
Monocytes Absolute: 0.5 10*3/uL (ref 0.1–1.0)
Monocytes Relative: 9 %
Neutro Abs: 3.1 10*3/uL (ref 1.7–7.7)
Neutrophils Relative %: 63 %
Platelets: 197 10*3/uL (ref 150–400)
RBC: 2.9 MIL/uL — ABNORMAL LOW (ref 4.22–5.81)
RDW: 19.1 % — ABNORMAL HIGH (ref 11.5–15.5)
WBC: 4.9 10*3/uL (ref 4.0–10.5)
nRBC: 0.4 % — ABNORMAL HIGH (ref 0.0–0.2)

## 2023-07-27 LAB — PREPARE RBC (CROSSMATCH)

## 2023-07-27 MED ORDER — SODIUM CHLORIDE 0.9% IV SOLUTION: Freq: Once | INTRAVENOUS | Status: AC

## 2023-07-27 MED ORDER — LOPERAMIDE HCL 2 MG PO CAPS
2.0000 mg | ORAL_CAPSULE | Freq: Two times a day (BID) | ORAL | Status: DC | PRN
  Administered 2023-07-29 – 2023-08-02 (×9): 2 mg via ORAL
  Filled 2023-07-27 (×9): qty 1

## 2023-07-27 NOTE — Progress Notes (Signed)
    Patient Name: Barry [name]           DOB: 1946-01-03  MRN: [id number]      Admission Date: 07/21/2023  Attending Provider: Lorin Glass, MD  Primary Diagnosis: UTI (urinary tract infection)   Level of care: Med-Surg    CROSS COVER NOTE   Date of Service   07/27/2023   Barry [name], 77 y.o. male, was admitted on 07/21/2023 for UTI (urinary tract infection).    HPI/Events of Note   Acute on chronic anemia  iron deficiency anemia Hemoglobin 7.2 -->  6.8.  No acute changes reported.  Hemodynamically stable. No melena, hematochezia, hematuria, or other bleeding reported tonight.    Interventions/ Plan   Blood transfusion, 1 unit PRBC        Anthoney Harada, DNP, [employer] [facility] [place]

## 2023-07-27 NOTE — Plan of Care (Signed)

## 2023-07-27 NOTE — Progress Notes (Signed)
  [facility] Liaison Note   [facility] continuing to following for discharge disposition for hospice services at LTC.    Dionicio Stall, [facility] Liaison [phone]

## 2023-07-27 NOTE — Progress Notes (Signed)
PROGRESS NOTE  Barry [name]  DOB: September 14, 1946  PCP: Georgann Housekeeper, MD NWG:[id number]  DOA: 07/21/2023  LOS: 6 days  Hospital Day: 7  Brief narrative: Barry [name] is a 77 y.o. male with PMH significant for obesity, HTN, HLD, A-fib on anticoagulation, CAD/CABG,  GERD, ulcerative colitis, chronic anemia, asthma, bladder cancer under the care of urology Dr. Annabell Howells. Recently underwent partial resection of a large bladder tumor in July and was scheduled for second stage on 07/23/2023. After his surgery in July, he was discharged to adult family for rehab but apparently patient has physical and mental declined and mostly remained bedbound for last several days.  8/4, patient presented to the ED with complaint altered mental status, progressively worsening for few days.  In the ED, patient was tachycardic in A-fib Labs with normal WBC count, hemoglobin at baseline Urinalysis showed turbid amber-colored urine with moderate hemoglobin with large leukocytes, many bacteria Patient was started on IV Rocephin Admitted to [facility] Urology consulted Urine culture grew E. coli.  Subjective: Patient was seen and examined this afternoon. Propped up in bed.  Not in distress.  [name]/POA at bedside.  Labs from this morning showed hemoglobin low at 6.8.  100 PRBC transfusion was ordered.  Currently getting transfused. Foley catheter with clear urine.  Assessment and plan: UTI associated with Foley catheter Urinalysis showed turbid amber-colored urine with moderate hemoglobin with large leukocytes, many bacteria Urine culture pending. Initially started on IV Rocephin.  Subsequent switched to oral cefadroxil  Progressive urothelial carcinoma bladder S/p partial resection July as well as repeat resection today 8/6 - Dr. Annabell Howells Per operative note, patient had extensive urothelial carcinoma.  The total area of resection exceeded 5 cm and probably unable to remove the entire tumor bulk.  Hematuria catheter was  irrigated and CBI started. CBI has been stopped.  Hematuria resolved.   8/8, I discussed with urology.  We decided to leave the Foley in place given patient's overall poor clinical condition, large tumor burden at risk of bleeding and direction of hospice care. Foley catheter with clear urine.   Chronic diarrhea History of ulcerative colitis Has history of ulcerative colitis and follows up with GI Dr. Levora Angel.  He is on sulfasalazine despite which was last 7 days, patient had significant diarrhea without blood Last CT abdomen from June 2024 did not show any evidence of active lesions. Currently improving on on sulfasalazine, Questran and is scheduled Imodium.[name]   1-2 bowel monitor last 24 hours. Switch from scheduled to as needed Imodium. C. difficile and GI pathogen panel negative  Hyponatremia Chronic and stable, unlikely to be of clinical significance. Recent Labs  Lab 07/21/23 1205 07/22/23 0346 07/23/23 0359 07/25/23 0431 07/26/23 0404 07/27/23 0418  NA 131* 131* 133* 135 135 136   Acute metabolic encephalopathy Lethargic on admission due to combination of UTI, malignancy, dehydration, low electrolytes. Gradually improving but still slow to respond.  Per POA, patient has been physically and mentally declining for the last few weeks. Mental status much better  Atrial fibrillation Continue Lopressor and Cardizem It seems, he was on Xarelto in the past which was appropriately stopped because of persistent hematuria   Acute on chronic anemia  iron deficiency anemia Hemoglobin down trended as below.  6.8 this morning.  Earlier in the hospital course, patient had bleeding from bladder tumor but no bleeding in last 5 days.  Urine looks clear.   1 unit of PRBC transfusion given today. Continue iron supplementation Continue to monitor. Recent  Labs    07/22/23 0346 07/23/23 0359 07/25/23 0431 07/26/23 0404 07/27/23 0418  HGB 7.8* 9.0* 7.6* 7.2* 6.8*  MCV 93.7 91.9 94.9  95.8 95.9   Hypertension continue losartan   Goals of care   Code Status: DNR.  Palliative care consult appreciated.     DVT prophylaxis:  SCDs Start: 07/21/23 1446   Antimicrobials: IV Rocephin Fluid: Not on IV hydration Consultants: Urology, palliative care Family Communication: Discussed with friend/POA Consuella Lose at bedside  Status: Inpatient Level of care:  Med-Surg   Patient from: [facility] short-term rehab Anticipated d/c to: Home hospice was recommended but patient lives alone and is not able to go back to living alone by himself. POA Consuella Lose and case management trying to find a long-term place for him.   Diet:  Diet Order             Diet regular Room service appropriate? Yes; Fluid consistency: Thin  Diet effective now                   Scheduled Meds:  acetaminophen  1,000 mg Oral TID   atorvastatin  40 mg Oral Daily   cefadroxil  1,000 mg Oral BID   Chlorhexidine Gluconate Cloth  6 each Topical Daily   cholestyramine light  4 g Oral BID   cycloSPORINE  1 drop Both Eyes BID   diclofenac Sodium  2 g Topical QID   diltiazem  120 mg Oral Daily   escitalopram  20 mg Oral Daily   ezetimibe  10 mg Oral Daily   feeding supplement  237 mL Oral BID BM   ferrous sulfate  325 mg Oral Q breakfast   Gerhardt's butt cream   Topical TID   latanoprost  1 drop Both Eyes QHS   loperamide  2 mg Oral Q8H   metoprolol tartrate  12.5 mg Oral BID   mometasone-formoterol  2 puff Inhalation BID   montelukast  10 mg Oral QHS   pantoprazole  40 mg Oral Daily   saccharomyces boulardii  250 mg Oral BID   sulfaSALAzine  1,000 mg Oral QID    PRN meds: acetaminophen **OR** acetaminophen, albuterol, mouth rinse   Infusions:     Antimicrobials: Anti-infectives (From admission, onward)    Start     Dose/Rate Route Frequency Ordered Stop   07/25/23 1430  cefadroxil (DURICEF) capsule 1,000 mg        1,000 mg Oral 2 times daily 07/25/23 1335     07/22/23 1200  cefTRIAXone  (ROCEPHIN) 1 g in sodium chloride 0.9 % 100 mL IVPB  Status:  Discontinued        1 g 200 mL/hr over 30 Minutes Intravenous Every 24 hours 07/21/23 1447 07/25/23 1335   07/21/23 1200  cefTRIAXone (ROCEPHIN) 2 g in sodium chloride 0.9 % 100 mL IVPB  Status:  Discontinued        2 g 200 mL/hr over 30 Minutes Intravenous Every 24 hours 07/21/23 1155 07/21/23 1447       Nutritional status:  Body mass index is 33.04 kg/m.  Nutrition Problem: Unintentional weight loss Etiology: chronic illness, acute illness Signs/Symptoms: percent weight loss (11% in ~2 months) Percent weight loss: 11 % (in ~2 months)     Objective: Vitals:   07/27/23 1139 07/27/23 1159  BP: (!) 89/66 110/66  Pulse: 90 95  Resp: (!) 22 (!) 22  Temp: 98.5 F (36.9 C) 98.4 F (36.9 C)  SpO2: 96% 96%  Intake/Output Summary (Last 24 hours) at 07/27/2023 1409 Last data filed at 07/27/2023 0500 Gross per 24 hour  Intake 270 ml  Output 1125 ml  Net -855 ml   Filed Weights   07/21/23 1530 07/23/23 0645  Weight: 84.6 kg 84.6 kg   Weight change:  Body mass index is 33.04 kg/m.   Physical Exam: General exam: Pleasant elderly Caucasian male.  Not in pain.  Foley catheter with clear urine. Skin: No rashes, lesions or ulcers. HEENT: Atraumatic, normocephalic, no obvious bleeding Lungs: Clear to auscultation bilaterally CVS: Regular rate and rhythm, no murmur GI/Abd soft, nontender, nondistended, bowel sound present CNS: Mental status intact. Psychiatry: Mood appropriate Extremities: No pedal edema, no calf tenderness  Data Review: I have personally reviewed the laboratory data and studies available.  F/u labs ordered Unresulted Labs (From admission, onward)     Start     Ordered   07/26/23 0500  CBC with Differential/Platelet  Daily,   R     Question:  Specimen collection method  Answer:  Lab=Lab collect   07/25/23 0758   07/26/23 0500  Basic metabolic panel  Daily,   R     Question:  Specimen  collection method  Answer:  Lab=Lab collect   07/25/23 0758            Total time spent in review of labs and imaging, patient evaluation, formulation of plan, documentation and communication with family: 45 minutes  Signed, Lorin Glass, MD [facility] 07/27/2023

## 2023-07-28 DIAGNOSIS — Z515 Encounter for palliative care: Secondary | ICD-10-CM | POA: Diagnosis not present

## 2023-07-28 DIAGNOSIS — N390 Urinary tract infection, site not specified: Secondary | ICD-10-CM | POA: Diagnosis not present

## 2023-07-28 DIAGNOSIS — T83511A Infection and inflammatory reaction due to indwelling urethral catheter, initial encounter: Secondary | ICD-10-CM | POA: Diagnosis not present

## 2023-07-28 LAB — CBC WITH DIFFERENTIAL/PLATELET
Abs Immature Granulocytes: 0.03 10*3/uL (ref 0.00–0.07)
Basophils Absolute: 0 10*3/uL (ref 0.0–0.1)
Basophils Relative: 1 %
Eosinophils Absolute: 0.1 10*3/uL (ref 0.0–0.5)
Eosinophils Relative: 2 %
HCT: 28 % — ABNORMAL LOW (ref 39.0–52.0)
Hemoglobin: 8.4 g/dL — ABNORMAL LOW (ref 13.0–17.0)
Immature Granulocytes: 1 %
Lymphocytes Relative: 27 %
Lymphs Abs: 1.3 10*3/uL (ref 0.7–4.0)
MCH: 28 pg (ref 26.0–34.0)
MCHC: 30 g/dL (ref 30.0–36.0)
MCV: 93.3 fL (ref 80.0–100.0)
Monocytes Absolute: 0.5 10*3/uL (ref 0.1–1.0)
Monocytes Relative: 10 %
Neutro Abs: 2.8 10*3/uL (ref 1.7–7.7)
Neutrophils Relative %: 59 %
Platelets: 208 10*3/uL (ref 150–400)
RBC: 3 MIL/uL — ABNORMAL LOW (ref 4.22–5.81)
RDW: 19 % — ABNORMAL HIGH (ref 11.5–15.5)
WBC: 4.6 10*3/uL (ref 4.0–10.5)
nRBC: 0 % (ref 0.0–0.2)

## 2023-07-28 LAB — TYPE AND SCREEN
ABO/RH(D): A POS
Antibody Screen: NEGATIVE
Unit division: 0

## 2023-07-28 LAB — BASIC METABOLIC PANEL WITH GFR
Anion gap: 7 (ref 5–15)
BUN: 13 mg/dL (ref 8–23)
CO2: 24 mmol/L (ref 22–32)
Calcium: 8.1 mg/dL — ABNORMAL LOW (ref 8.9–10.3)
Chloride: 102 mmol/L (ref 98–111)
Creatinine, Ser: 0.58 mg/dL — ABNORMAL LOW (ref 0.61–1.24)
GFR, Estimated: >60 mL/min (ref >60)
Glucose, Bld: 91 mg/dL (ref 70–99)
Potassium: 4.1 mmol/L (ref 3.5–5.1)
Sodium: 133 mmol/L — ABNORMAL LOW (ref 135–145)

## 2023-07-28 LAB — BPAM RBC
Blood Product Expiration Date: 202409032359
ISSUE DATE / TIME: 202408101132
Unit Type and Rh: 6200

## 2023-07-28 LAB — GLUCOSE, CAPILLARY: Glucose-Capillary: 101 mg/dL — ABNORMAL HIGH (ref 70–99)

## 2023-07-28 NOTE — Progress Notes (Signed)
[facility] Liaison Note  [facility] liaison team continues to follow for discharge disposition for hospice services at LTC.  Doreatha Martin, RN, [facility] [phone]

## 2023-07-28 NOTE — Progress Notes (Signed)
Palliative Medicine  Name: Barry [name] Date: 07/28/2023 MRN: [id number]  DOB: Aug 07, 1946  Patient Care Team: Georgann Housekeeper, MD as PCP - General (Internal Medicine) Wendall Stade, MD as PCP - Cardiology (Cardiology) Nyoka Cowden, MD as Consulting Physician (Pulmonary Disease)    REASON FOR CONSULTATION: Barry [name] is a 77 y.o. male with multiple medical problems including obesity, hypertension, hyperlipidemia, A-fib on anticoagulation, CAD/CABG, and bladder cancer status post partial resection.  Patient admitted to rehab following bladder surgery and has had physical decline.  He was admitted with UTI and hematuria due to progressive bladder cancer.  Palliative care was consulted to address goals.  CODE STATUS: DNR  PAST MEDICAL HISTORY: Past Medical History:  Diagnosis Date   A-fib (HCC)    Acute sinusitis 03/24/2012   Followed in Pulmonary clinic/ [facility]/ [name] - CT sinus 08/07/2013  Short air-fluid levels in the maxillary sinuses bilaterally suggesting early acute sinusitis. - repeat ct sinus 04/30/2014 > ok     Acute sinusitis, unspecified 08/14/2010   Qualifier: Diagnosis of  By: Clent Ridges NP, [name]     Anemia    Asthma    PFTs 06/15/05 FEV1 85% predicted ratio 68% and truncation of resp loop in a sawtooth pattern. HFA 25% 08-01-2009 >50% Nov 01, 2009>50% 01-31-2010   Asthma, chronic 11/24/2007   Followed in Pulmonary clinic/ [facility]/ [name]  - PFTs 06/15/2005 FEV1 85% predicted ratio 68% and truncation of respiratory loop in a sawtooth pattern  - HFA 25% August 01, 2009 > 50% November 01, 2009 > 50% January 31, 2010 therefore changed to neb bud/brovana - HFA 75% p extensive coaching 08/07/2013  > insurance issues so try symbicort 160 2 bid instead of neb - 11/16/2015   p extensive   Bladder mass    BRONCHITIS, ACUTE 01/22/2011   Qualifier: Diagnosis of  By: Clent Ridges NP, [name]     CAD (coronary artery disease), native coronary artery 02/06/2019   S/p  CABG 2001 complicated by paralyzed left hemidiaphram   Cataract    Chronic cough    Chronic rhinitis 05/02/2009   Followed in Pulmonary clinic/ [facility]/ [name]    - 03/24/12 CT Sinus > Negative paranasal sinuses - Sinus CT 08/07/2013 >>Short air-fluid levels in the maxillary sinuses bilaterally suggesting early acute sinusitis - Repeat augmentin x 21 days 03/27/2014 then sinus ct if not better  - flonase/afrin regimen 02/11/15 >>>      Coronary heart disease    COUGH, CHRONIC 11/24/2007   Followed in Pulmonary clinic/ [facility]/ [name]      DDD (degenerative disc disease), lumbar    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   Followed in Pulmonary clinic/ [name]  -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     Excessive ear wax, bilateral 09/15/2018   GASTROESOPHAGEAL REFLUX DISEASE 08/27/2008   Followed as Primary Care Patient/ GI/ Dr  Danise Edge     Glaucoma    Hyperlipidemia LDL goal <70 02/06/2019   Hypertension    Obesity    Osteoarthritis    Paralyzed hemidiaphragm    left, 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   Retina disorder 2014   Severe obesity (BMI >= 40) (HCC) 11/24/2007       - Target wt < 191 to get under BMI 30    Ulcerative colitis    ULCERATIVE COLITIS 11/24/2007   Followed as Primary Care Patient/ GI/ Dr  Danise Edge     URI 04/09/2008   Qualifier: Diagnosis of  ByClent Ridges NP, [name]      PAST SURGICAL HISTORY:  Past Surgical History:  Procedure Laterality Date   CARDIAC CATHETERIZATION Left    COLONOSCOPY WITH PROPOFOL N/A 11/29/2015   Procedure: COLONOSCOPY WITH PROPOFOL;  Surgeon: Charolett Bumpers, MD;  Location: [facility] ENDOSCOPY;  Service: Endoscopy;  Laterality: N/A;   CORONARY ARTERY BYPASS GRAFT     x6   TRANSURETHRAL RESECTION OF BLADDER TUMOR N/A 06/28/2023   Procedure: CYSTOSCOPY TRANSURETHRAL RESECTION OF BLADDER TUMOR (TURBT);  Surgeon: Bjorn Pippin, MD;  Location: [facility] ORS;  Service: Urology;  Laterality: N/A;   TRANSURETHRAL RESECTION OF  BLADDER TUMOR N/A 07/23/2023   Procedure: TRANSURETHRAL RESECTION OF BLADDER TUMOR (TURBT);  Surgeon: Bjorn Pippin, MD;  Location: [facility] ORS;  Service: Urology;  Laterality: N/A;  90 MINS FOR CASE    HEMATOLOGY/ONCOLOGY HISTORY:  Oncology History   No history exists.    ALLERGIES:  is allergic to piroxicam.  MEDICATIONS:  Current Facility-Administered Medications  Medication Dose Route Frequency Provider Last Rate Last Admin   acetaminophen (TYLENOL) tablet 650 mg  650 mg Oral Q6H PRN Kirby Crigler, Mir M, MD       Or   acetaminophen (TYLENOL) suppository 650 mg  650 mg Rectal Q6H PRN Kirby Crigler, Mir M, MD       acetaminophen (TYLENOL) tablet 1,000 mg  1,000 mg Oral TID Barbara Cower, NP   1,000 mg at 07/28/23 1434   albuterol (PROVENTIL) (2.5 MG/3ML) 0.083% nebulizer solution 2.5 mg  2.5 mg Nebulization Q2H PRN Kirby Crigler, Mir M, MD       atorvastatin (LIPITOR) tablet 40 mg  40 mg Oral Daily Kirby Crigler, Mir M, MD   40 mg at 07/28/23 0754   cefadroxil (DURICEF) capsule 1,000 mg  1,000 mg Oral BID Lorin Glass, MD   1,000 mg at 07/28/23 0754   Chlorhexidine Gluconate Cloth 2 % PADS 6 each  6 each Topical Daily Marcelino Duster, MD   6 each at 07/28/23 0756   cholestyramine light (PREVALITE) packet 4 g  4 g Oral BID Kirby Crigler, Mir M, MD   4 g at 07/28/23 1232   cycloSPORINE (RESTASIS) 0.05 % ophthalmic emulsion 1 drop  1 drop Both Eyes BID Kirby Crigler, Mir M, MD   1 drop at 07/28/23 0753   diclofenac Sodium (VOLTAREN) 1 % topical gel 2 g  2 g Topical QID Barbara Cower, NP   2 g at 07/28/23 1232   diltiazem (CARDIZEM CD) 24 hr capsule 120 mg  120 mg Oral Daily Dahal, Melina Schools, MD   120 mg at 07/28/23 0754   escitalopram (LEXAPRO) tablet 20 mg  20 mg Oral Daily Kirby Crigler, Mir M, MD   20 mg at 07/28/23 0754   ezetimibe (ZETIA) tablet 10 mg  10 mg Oral Daily Kirby Crigler, Mir M, MD   10 mg at 07/28/23 0754   feeding supplement (ENSURE ENLIVE / ENSURE PLUS) liquid 237 mL  237 mL Oral BID BM  Kirby Crigler, Mir M, MD   237 mL at 07/28/23 1434   ferrous sulfate tablet 325 mg  325 mg Oral Q breakfast Kirby Crigler, Mir M, MD   325 mg at 07/28/23 0754   Gerhardt's butt cream   Topical TID Maryln Gottron, MD   Given at 07/28/23 1434   latanoprost (XALATAN) 0.005 % ophthalmic solution 1 drop  1 drop Both Eyes QHS Kirby Crigler, Mir M, MD   1 drop at 07/27/23 2127   loperamide (IMODIUM) capsule 2 mg  2 mg Oral  BID PRN Lorin Glass, MD       metoprolol tartrate (LOPRESSOR) tablet 12.5 mg  12.5 mg Oral BID Dahal, Melina Schools, MD   12.5 mg at 07/28/23 0754   mometasone-formoterol (DULERA) 200-5 MCG/ACT inhaler 2 puff  2 puff Inhalation BID Kirby Crigler, Mir M, MD   2 puff at 07/28/23 0755   montelukast (SINGULAIR) tablet 10 mg  10 mg Oral QHS Kirby Crigler, Mir M, MD   10 mg at 07/27/23 2124   Oral care mouth rinse  15 mL Mouth Rinse PRN Kirby Crigler, Mir M, MD       pantoprazole (PROTONIX) EC tablet 40 mg  40 mg Oral Daily Kirby Crigler, Mir M, MD   40 mg at 07/28/23 0754   saccharomyces boulardii (FLORASTOR) capsule 250 mg  250 mg Oral BID Barbara Cower, NP   250 mg at 07/28/23 0755   sulfaSALAzine (AZULFIDINE) tablet 1,000 mg  1,000 mg Oral QID Lorin Glass, MD   1,000 mg at 07/28/23 1232    VITAL SIGNS: BP (!) 127/59 (BP Location: Left Arm)   Pulse 89   Temp 97.7 F (36.5 C) (Oral)   Resp 16   Ht 5\' 3"  (1.6 m)   Wt 186 lb 8.2 oz (84.6 kg)   SpO2 96%   BMI 33.04 kg/m  Filed Weights   07/21/23 1530 07/23/23 0645  Weight: 186 lb 8.2 oz (84.6 kg) 186 lb 8.2 oz (84.6 kg)    Estimated body mass index is 33.04 kg/m as calculated from the following:   Height as of this encounter: 5\' 3"  (1.6 m).   Weight as of this encounter: 186 lb 8.2 oz (84.6 kg).  LABS: CBC:    Component Value Date/Time   WBC 4.6 07/28/2023 0407   HGB 8.4 (L) 07/28/2023 0407   HGB 11.6 (L) 02/12/2023 1114   HCT 28.0 (L) 07/28/2023 0407   HCT 35.9 (L) 02/12/2023 1114   PLT 208 07/28/2023 0407   PLT 245 02/12/2023 1114    MCV 93.3 07/28/2023 0407   MCV 93 02/12/2023 1114   NEUTROABS 2.8 07/28/2023 0407   LYMPHSABS 1.3 07/28/2023 0407   MONOABS 0.5 07/28/2023 0407   EOSABS 0.1 07/28/2023 0407   BASOSABS 0.0 07/28/2023 0407   Comprehensive Metabolic Panel:    Component Value Date/Time   NA 133 (L) 07/28/2023 0407   NA 142 12/07/2022 0904   K 4.1 07/28/2023 0407   CL 102 07/28/2023 0407   CO2 24 07/28/2023 0407   BUN 13 07/28/2023 0407   BUN 28 (H) 12/07/2022 0904   CREATININE 0.58 (L) 07/28/2023 0407   GLUCOSE 91 07/28/2023 0407   CALCIUM 8.1 (L) 07/28/2023 0407   AST 38 07/21/2023 1205   ALT 30 07/21/2023 1205   ALKPHOS 105 07/21/2023 1205   BILITOT 0.5 07/21/2023 1205   PROT 6.5 07/21/2023 1205   ALBUMIN 2.6 (L) 07/21/2023 1205    RADIOGRAPHIC STUDIES: CT Head Wo Contrast  Result Date: 07/21/2023 CLINICAL DATA:  Mental status change, unknown cause EXAM: CT HEAD WITHOUT CONTRAST TECHNIQUE: Contiguous axial images were obtained from the base of the skull through the vertex without intravenous contrast. RADIATION DOSE REDUCTION: This exam was performed according to the departmental dose-optimization program which includes automated exposure control, adjustment of the mA and/or kV according to patient size and/or use of iterative reconstruction technique. COMPARISON:  None Available. FINDINGS: Brain: No evidence of acute infarction, hemorrhage, hydrocephalus, extra-axial collection or mass lesion/mass effect. Sequela of mild chronic microvascular ischemic change. Generalized  volume loss. Vascular: No hyperdense vessel or unexpected calcification. Skull: Normal. Negative for fracture or focal lesion. Sinuses/Orbits: No middle ear or mastoid effusion. Paranasal sinuses are clear. Bilateral lens replacement. Orbits are otherwise unremarkable. Other: None. IMPRESSION: No acute intracranial abnormality. Electronically Signed   By: Lorenza [name] M.D.   On: 07/21/2023 13:31   DG Chest Port 1 View  Result Date:  07/21/2023 CLINICAL DATA:  Altered mental status, questionable sepsis. EXAM: PORTABLE CHEST 1 VIEW COMPARISON:  Chest radiograph dated 06/07/2021. FINDINGS: The heart size and mediastinal contours are within normal limits. The lung volumes are low. There is mild left basilar atelectasis/airspace disease. The right lung is clear. No large pleural effusion or pneumothorax is identified. The visualized skeletal structures are unremarkable. IMPRESSION: Low lung volumes with mild left basilar atelectasis/airspace disease. Electronically Signed   By: Romona Curls M.D.   On: 07/21/2023 13:15    PERFORMANCE STATUS (ECOG) : 4 - Bedbound  Review of Systems Unable to complete  Physical Exam General: NAD Abdomen: soft, nontender, + bowel sounds GU: no suprapubic tenderness Extremities: no edema, no joint deformities Skin: no rashes Neurological: Weakness with some confusion  IMPRESSION: Follow-up visit.  Patient comfortable appearing.  Denies any acute symptomatic complaints or concerns.  I met with his friend and POA, Consuella Lose.  She describes overall decline over the past several months.  She had decided to pursue hospice involvement after previous discussion with PMT.  However, she verbalized some concern with disposition as she does not feel that patient can return home with hospice and he does not yet have Medicaid to allow for hospice at an SNF.  It does not appear that patient is appropriate for hospice IPU.  She plans to speak with social work tomorrow regarding discharge planning.  PLAN: -Continue current scope of treatment -TOC to assist with discharge planning  Time Total: 25 minutes  Visit consisted of counseling and education dealing with the complex and emotionally intense issues of symptom management and palliative care in the setting of serious and potentially life-threatening illness.Greater than 50%  of this time was spent counseling and coordinating care related to the above  assessment and plan.  Signed by: Laurette Schimke, PhD, NP-C

## 2023-07-28 NOTE — Progress Notes (Signed)
PROGRESS NOTE  Barry [name]  DOB: 11-29-1946  PCP: Georgann Housekeeper, MD OAC:[id number]  DOA: 07/21/2023  LOS: 7 days  Hospital Day: 8  Brief narrative: Barry [name] is a 77 y.o. male with PMH significant for obesity, HTN, HLD, A-fib on anticoagulation, CAD/CABG,  GERD, ulcerative colitis, chronic anemia, asthma, bladder cancer under the care of urology Dr. Annabell Howells. Recently underwent partial resection of a large bladder tumor in July and was scheduled for second stage on 07/23/2023. After his surgery in July, he was discharged to adult family for rehab but apparently patient has physical and mental declined and mostly remained bedbound for last several days.  8/4, patient presented to the ED with complaint altered mental status, progressively worsening for few days.  In the ED, patient was tachycardic in A-fib Labs with normal WBC count, hemoglobin at baseline Urinalysis showed turbid amber-colored urine with moderate hemoglobin with large leukocytes, many bacteria Patient was started on IV Rocephin Admitted to [facility] consulted Urine culture grew E. coli.  8/6, patient underwent repeat partial resection of bladder tumor by Dr. Harrietta Guardian course prolonged because of unclear disposition plan.  See below for details.  Subjective: Patient was seen and examined this morning.. Propped up in bed.  Not in distress.  Barry [name] not at bedside today. Hemoglobin level improved with blood transfusion yesterday. Foley catheter with clear urine.  Assessment and plan: UTI associated with Foley catheter Urinalysis showed turbid amber-colored urine with moderate hemoglobin with large leukocytes, many bacteria Urine culture pending. Initially started on IV Rocephin.  Subsequently switched to oral cefadroxil  Progressive urothelial carcinoma bladder S/p partial resection July as well as repeat resection today 8/6 - Dr. Annabell Howells Per operative note, patient had extensive urothelial carcinoma.   The total area of resection exceeded 5 cm and probably unable to remove the entire tumor bulk.  Hematuria catheter was irrigated and CBI started. CBI has been stopped.  Hematuria resolved.   8/8, I discussed with urology.  We decided to leave the Foley in place given patient's overall poor clinical condition, large tumor burden at risk of bleeding and direction of hospice care. Foley catheter with clear urine.  Chronic diarrhea History of ulcerative colitis Has history of ulcerative colitis and follows up with GI Dr. Levora Angel.  He is on sulfasalazine despite which, patient had significant diarrhea without blood Last CT abdomen from June 2024 did not show any evidence of active lesions. Currently improving on on sulfasalazine, Questran and PRN Imodium..   No BM documented in last 24 hours.  Acute on chronic anemia  iron deficiency anemia Hemoglobin down trended as below.  6.8 this morning.  Earlier in the hospital course, patient had bleeding from bladder tumor but no bleeding in last 5 days.  Urine looks clear.   1 unit of PRBC transfusion given on 8/10. Continue iron supplementation Continue to monitor. Recent Labs    07/25/23 0431 07/26/23 0404 07/27/23 0418 07/27/23 2113 07/28/23 0407  HGB 7.6* 7.2* 6.8* 8.3* 8.4*  MCV 94.9 95.8 95.9 92.4 93.3   Acute metabolic encephalopathy Patient was lethargic on admission due to combination of UTI, malignancy, dehydration, low electrolytes. Mental status much improved now.   Per POA, patient has been physically and mentally declining for the last few weeks.  Hyponatremia Chronic and stable Recent Labs  Lab 07/21/23 1205 07/22/23 0346 07/23/23 0359 07/25/23 0431 07/26/23 0404 07/27/23 0418 07/28/23 0407  NA 131* 131* 133* 135 135 136 133*   Atrial fibrillation Continue Lopressor  and Cardizem It seems, he was on Xarelto in the past which was appropriately stopped because of persistent hematuria   Hypertension continue  losartan   Goals of care   Code Status: DNR.  Palliative care consult appreciated.     DVT prophylaxis:  SCDs Start: 07/21/23 1446   Antimicrobials: IV Rocephin Fluid: Not on IV hydration Consultants: Urology, palliative care Family Communication: None at bedside today  Status: Inpatient Level of care:  Med-Surg   Patient from: [facility] short-term rehab Anticipated d/c to: Home hospice was recommended but patient lives alone and is not able to go back to living alone by himself. POA Consuella Lose and case management trying to find a long-term place for him.   Diet:  Diet Order             Diet regular Room service appropriate? Yes; Fluid consistency: Thin  Diet effective now                   Scheduled Meds:  acetaminophen  1,000 mg Oral TID   atorvastatin  40 mg Oral Daily   cefadroxil  1,000 mg Oral BID   Chlorhexidine Gluconate Cloth  6 each Topical Daily   cholestyramine light  4 g Oral BID   cycloSPORINE  1 drop Both Eyes BID   diclofenac Sodium  2 g Topical QID   diltiazem  120 mg Oral Daily   escitalopram  20 mg Oral Daily   ezetimibe  10 mg Oral Daily   feeding supplement  237 mL Oral BID BM   ferrous sulfate  325 mg Oral Q breakfast   Gerhardt's butt cream   Topical TID   latanoprost  1 drop Both Eyes QHS   metoprolol tartrate  12.5 mg Oral BID   mometasone-formoterol  2 puff Inhalation BID   montelukast  10 mg Oral QHS   pantoprazole  40 mg Oral Daily   saccharomyces boulardii  250 mg Oral BID   sulfaSALAzine  1,000 mg Oral QID    PRN meds: acetaminophen **OR** acetaminophen, albuterol, loperamide, mouth rinse   Infusions:     Antimicrobials: Anti-infectives (From admission, onward)    Start     Dose/Rate Route Frequency Ordered Stop   07/25/23 1430  cefadroxil (DURICEF) capsule 1,000 mg        1,000 mg Oral 2 times daily 07/25/23 1335     07/22/23 1200  cefTRIAXone (ROCEPHIN) 1 g in sodium chloride 0.9 % 100 mL IVPB  Status:  Discontinued         1 g 200 mL/hr over 30 Minutes Intravenous Every 24 hours 07/21/23 1447 07/25/23 1335   07/21/23 1200  cefTRIAXone (ROCEPHIN) 2 g in sodium chloride 0.9 % 100 mL IVPB  Status:  Discontinued        2 g 200 mL/hr over 30 Minutes Intravenous Every 24 hours 07/21/23 1155 07/21/23 1447       Nutritional status:  Body mass index is 33.04 kg/m.  Nutrition Problem: Unintentional weight loss Etiology: chronic illness, acute illness Signs/Symptoms: percent weight loss (11% in ~2 months) Percent weight loss: 11 % (in ~2 months)     Objective: Vitals:   07/28/23 0601 07/28/23 1119  BP: (!) 127/93 (!) 127/59  Pulse: 70 89  Resp: 14 16  Temp: 98.1 F (36.7 C) 97.7 F (36.5 C)  SpO2: 95% 96%    Intake/Output Summary (Last 24 hours) at 07/28/2023 1202 Last data filed at 07/28/2023 1100 Gross per 24 hour  Intake 1425.25 ml  Output 3900 ml  Net -2474.75 ml   Filed Weights   07/21/23 1530 07/23/23 0645  Weight: 84.6 kg 84.6 kg   Weight change:  Body mass index is 33.04 kg/m.   Physical Exam: General exam: Pleasant elderly Caucasian male.  Not in pain.  Foley catheter with clear urine. Skin: No rashes, lesions or ulcers. HEENT: Atraumatic, normocephalic, no obvious bleeding Lungs: Clear to auscultation bilaterally CVS: Regular rate and rhythm, no murmur GI/Abd soft, nontender, nondistended, bowel sound present CNS: Mental status intact. Psychiatry: Mood appropriate Extremities: No pedal edema, no calf tenderness  Data Review: I have personally reviewed the laboratory data and studies available.  F/u labs ordered Unresulted Labs (From admission, onward)    None       Total time spent in review of labs and imaging, patient evaluation, formulation of plan, documentation and communication with family: 25 minutes  Signed, Lorin Glass, MD [facility] 07/28/2023

## 2023-07-29 DIAGNOSIS — N390 Urinary tract infection, site not specified: Secondary | ICD-10-CM | POA: Diagnosis not present

## 2023-07-29 NOTE — Plan of Care (Signed)
?  Problem: Clinical Measurements: ?Goal: Will remain free from infection ?Outcome: Progressing ?  ?

## 2023-07-29 NOTE — Progress Notes (Signed)
PROGRESS NOTE    Barry [name]  ZOX:[id number] DOB: 1945-12-22 DOA: 07/21/2023 PCP: Georgann Housekeeper, MD    Brief Narrative:  77 year old with obesity, A-fib anticoagulated, coronary artery disease and CABG, ulcerative colitis, chronic anemia, bladder cancer who recently underwent resection of large bladder tumor in July and was scheduled for second stage of surgery on 8/6.  Patient has been declining since last surgery.  8/4, presented to the ER with altered mental status and progressively worsening lethargy.  In the emergency room tachycardic in A-fib.  Hemoglobin baseline.  Urine analysis with infection. 8/6, underwent repeat partial resection of bladder tumor, catheter placement.  Hospital course prolonged because of unclear disposition plan.   Assessment & Plan:   UTI associated with Foley catheter: Catheter exchanged.  Blood cultures negative.  Urine cultures with E. coli.  Treated with Rocephin.  Now on cefadroxil.  Acute blood loss anemia: Received blood transfusions.  Continue iron supplementation.  Hemoglobin is stable.  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  Unsafe discharge.  Palliative following.  DVT prophylaxis: SCDs Start: 07/21/23 1446   Code Status: DNR Family Communication: None at the bedside Disposition Plan: Status is: Inpatient Remains inpatient appropriate because: Unsafe discharge disposition plan     Consultants:  Neurology Palliative Hospice  Procedures:  Multiple procedures as above  Antimicrobials:  Multiple antibiotics, currently on cefadroxil   Subjective: Patient seen and examined.  Pleasant.  Flat affect.  Denies any complaints.  He was trying to eat breakfast.  Objective: Vitals:   07/28/23 1119 07/28/23 2013 07/29/23 0613 07/29/23 0743  BP: (!) 127/59 129/64 (!) 136/97    Pulse: 89 87 96   Resp: 16 17 16    Temp: 97.7 F (36.5 C) 98.4 F (36.9 C) 98.5 F (36.9 C)   TempSrc: Oral Oral Oral   SpO2: 96% 93% 93% 92%  Weight:      Height:        Intake/Output Summary (Last 24 hours) at 07/29/2023 1328 Last data filed at 07/29/2023 0900 Gross per 24 hour  Intake 120 ml  Output 525 ml  Net -405 ml   Filed Weights   07/21/23 1530 07/23/23 0645  Weight: 84.6 kg 84.6 kg    Examination:  Chronically sick looking.  Looks comfortable. Foley catheter with clear urine.    Data Reviewed: I have personally reviewed following labs and imaging studies  CBC: Recent Labs  Lab 07/25/23 0431 07/26/23 0404 07/27/23 0418 07/27/23 2113 07/28/23 0407  WBC 6.0 5.1 4.0 4.9 4.6  NEUTROABS 4.0 3.3 2.3 3.1 2.8  HGB 7.6* 7.2* 6.8* 8.3* 8.4*  HCT 26.0* 25.2* 23.1* 26.8* 28.0*  MCV 94.9 95.8 95.9 92.4 93.3  PLT 229 198 199 197 208   Basic Metabolic Panel: Recent Labs  Lab 07/23/23 0359 07/25/23 0431 07/26/23 0404 07/27/23 0418 07/28/23 0407  NA 133* 135 135 136 133*  K 3.5 3.5 3.1* 4.0 4.1  CL 104 105 105 107 102  CO2 20* 20* 21* 22 24  GLUCOSE 96 97 99 110* 91  BUN 15 12 10 12 13   CREATININE 0.58* 0.58* 0.53* 0.57* 0.58*  CALCIUM 8.7* 7.9* 8.0* 8.1* 8.1*   GFR: Estimated Creatinine Clearance: 72 mL/min (A) (by C-G formula based on SCr of 0.58 mg/dL (L)). Liver Function Tests: No results for input(s): "AST", "ALT", "ALKPHOS", "BILITOT", "PROT", "ALBUMIN" in the last 168 hours. No results for  input(s): "LIPASE", "AMYLASE" in the last 168 hours. No results for input(s): "AMMONIA" in the last 168 hours. Coagulation Profile: No results for input(s): "INR", "PROTIME" in the last 168 hours. Cardiac Enzymes: No results for input(s): "CKTOTAL", "CKMB", "CKMBINDEX", "TROPONINI" in the last 168 hours. BNP (last 3 results) No results for input(s): "PROBNP" in the last 8760 hours. HbA1C: No results for input(s): "HGBA1C" in the last 72  hours. CBG: Recent Labs  Lab 07/23/23 0521 07/28/23 2015  GLUCAP 89 101*   Lipid Profile: No results for input(s): "CHOL", "HDL", "LDLCALC", "TRIG", "CHOLHDL", "LDLDIRECT" in the last 72 hours. Thyroid Function Tests: No results for input(s): "TSH", "T4TOTAL", "FREET4", "T3FREE", "THYROIDAB" in the last 72 hours. Anemia Panel: No results for input(s): "VITAMINB12", "FOLATE", "FERRITIN", "TIBC", "IRON", "RETICCTPCT" in the last 72 hours. Sepsis Labs: No results for input(s): "PROCALCITON", "LATICACIDVEN" in the last 168 hours.  Recent Results (from the past 240 hour(s))  Blood Culture (routine x 2)     Status: None   Collection Time: 07/21/23 12:05 PM   Specimen: BLOOD  Result Value Ref Range Status   Specimen Description   Final    BLOOD BLOOD RIGHT HAND Performed at [facility], [address], [place], [place] [address]    Special Requests   Final    BOTTLES DRAWN AEROBIC AND ANAEROBIC Blood Culture adequate volume Performed at [facility], [address], [place], [place] [address]    Culture   Final    NO GROWTH 5 DAYS Performed at [facility], [address] [place], [place] [address]    Report Status 07/26/2023 FINAL  Final  Blood Culture (routine x 2)     Status: None   Collection Time: 07/21/23 12:18 PM   Specimen: BLOOD  Result Value Ref Range Status   Specimen Description   Final    BLOOD RIGHT ANTECUBITAL Performed at [facility], [address], [place], [place] [address]    Special Requests   Final    BOTTLES DRAWN AEROBIC AND ANAEROBIC Blood Culture adequate volume Performed at [facility], [address], [place], [place] [address]    Culture   Final    NO GROWTH 5 DAYS Performed at [facility], [address] [place], [place] [address]    Report Status 07/26/2023 FINAL  Final  Urine Culture     Status: Abnormal   Collection Time: 07/21/23 12:35 PM    Specimen: Urine, Random  Result Value Ref Range Status   Specimen Description   Final    URINE, RANDOM Performed at [facility], [address] [place], [place] [address]    Special Requests   Final    NONE Reflexed from [id number] Performed at [facility] [place], [address], [place], [place] [address]    Culture >=100,000 COLONIES/mL ESCHERICHIA COLI (A)  Final   Report Status 07/23/2023 FINAL  Final   Organism ID, Bacteria ESCHERICHIA COLI (A)  Final      Susceptibility   Escherichia coli - MIC*    AMPICILLIN 4 SENSITIVE Sensitive     CEFAZOLIN <=4 SENSITIVE Sensitive     CEFEPIME <=0.12 SENSITIVE Sensitive     CEFTRIAXONE <=0.25 SENSITIVE Sensitive     CIPROFLOXACIN <=0.25 SENSITIVE Sensitive     GENTAMICIN >=16 RESISTANT Resistant     IMIPENEM <=0.25 SENSITIVE Sensitive     NITROFURANTOIN <=16 SENSITIVE Sensitive     TRIMETH/SULFA <=20 SENSITIVE  Sensitive     AMPICILLIN/SULBACTAM <=2 SENSITIVE Sensitive     PIP/TAZO <=4 SENSITIVE Sensitive     * >=100,000 COLONIES/mL ESCHERICHIA COLI  Resp panel by RT-PCR (RSV, Flu A&B, Covid) Anterior Nasal Swab     Status: None   Collection Time: 07/21/23  2:14 PM   Specimen: Anterior Nasal Swab  Result Value Ref Range Status   SARS Coronavirus 2 by RT PCR NEGATIVE NEGATIVE Final    Comment: (NOTE) SARS-CoV-2 target nucleic acids are NOT DETECTED.  The SARS-CoV-2 RNA is generally detectable in upper respiratory specimens during the acute phase of infection. The lowest concentration of SARS-CoV-2 viral copies this assay can detect is 138 copies/mL. A negative result does not preclude SARS-Cov-2 infection and should not be used as the sole basis for treatment or other patient management decisions. A negative result may occur with  improper specimen collection/handling, submission of specimen other than nasopharyngeal swab, presence of viral mutation(s) within the areas targeted by this assay, and  inadequate number of viral copies(<138 copies/mL). A negative result must be combined with clinical observations, patient history, and epidemiological information. The expected result is Negative.  Fact Sheet for Patients:  BloggerCourse.com  Fact Sheet for Healthcare Providers:  SeriousBroker.it  This test is no t yet approved or cleared by the [place] FDA and  has been authorized for detection and/or diagnosis of SARS-CoV-2 by FDA under an Emergency Use Authorization (EUA). This EUA will remain  in effect (meaning this test can be used) for the duration of the COVID-19 declaration under Section 564(b)(1) of the Act, 21 U.S.C.section 360bbb-3(b)(1), unless the authorization is terminated  or revoked sooner.       Influenza A by PCR NEGATIVE NEGATIVE Final   Influenza B by PCR NEGATIVE NEGATIVE Final    Comment: (NOTE) The Xpert Xpress SARS-CoV-2/FLU/RSV plus assay is intended as an aid in the diagnosis of influenza from Nasopharyngeal swab specimens and should not be used as a sole basis for treatment. Nasal washings and aspirates are unacceptable for Xpert Xpress SARS-CoV-2/FLU/RSV testing.  Fact Sheet for Patients: BloggerCourse.com  Fact Sheet for Healthcare Providers: SeriousBroker.it  This test is not yet approved or cleared by the [place] FDA and has been authorized for detection and/or diagnosis of SARS-CoV-2 by FDA under an Emergency Use Authorization (EUA). This EUA will remain in effect (meaning this test can be used) for the duration of the COVID-19 declaration under Section 564(b)(1) of the Act, 21 U.S.C. section 360bbb-3(b)(1), unless the authorization is terminated or revoked.     Resp Syncytial Virus by PCR NEGATIVE NEGATIVE Final    Comment: (NOTE) Fact Sheet for Patients: BloggerCourse.com  Fact Sheet for Healthcare  Providers: SeriousBroker.it  This test is not yet approved or cleared by the [place] FDA and has been authorized for detection and/or diagnosis of SARS-CoV-2 by FDA under an Emergency Use Authorization (EUA). This EUA will remain in effect (meaning this test can be used) for the duration of the COVID-19 declaration under Section 564(b)(1) of the Act, 21 U.S.C. section 360bbb-3(b)(1), unless the authorization is terminated or revoked.  Performed at [facility], [address] [place], [place] [address]   C Difficile Quick Screen w PCR reflex     Status: None   Collection Time: 07/22/23  6:12 AM   Specimen: STOOL  Result Value Ref Range Status   C Diff antigen NEGATIVE NEGATIVE Final   C Diff toxin NEGATIVE NEGATIVE Final   C Diff interpretation  No C. difficile detected.  Final    Comment: Performed at [facility], [address], [place], [place] [address]  Surgical PCR screen     Status: Abnormal   Collection Time: 07/22/23  6:12 AM   Specimen: STOOL; Nasal Swab  Result Value Ref Range Status   MRSA, PCR NEGATIVE NEGATIVE Final   Staphylococcus aureus POSITIVE (A) NEGATIVE Final    Comment: (NOTE) The Xpert SA Assay (FDA approved for NASAL specimens in patients 51 years of age and older), is one component of a comprehensive surveillance program. It is not intended to diagnose infection nor to guide or monitor treatment. Performed at [facility], [address], [place], [place] [address]          Radiology Studies: No results found.      Scheduled Meds:  acetaminophen  1,000 mg Oral TID   atorvastatin  40 mg Oral Daily   cefadroxil  1,000 mg Oral BID   Chlorhexidine Gluconate Cloth  6 each Topical Daily   cholestyramine light  4 g Oral BID   cycloSPORINE  1 drop Both Eyes BID   diclofenac Sodium  2 g Topical QID   diltiazem  120 mg Oral Daily   escitalopram  20 mg Oral  Daily   ezetimibe  10 mg Oral Daily   feeding supplement  237 mL Oral BID BM   ferrous sulfate  325 mg Oral Q breakfast   Gerhardt's butt cream   Topical TID   latanoprost  1 drop Both Eyes QHS   metoprolol tartrate  12.5 mg Oral BID   mometasone-formoterol  2 puff Inhalation BID   montelukast  10 mg Oral QHS   pantoprazole  40 mg Oral Daily   saccharomyces boulardii  250 mg Oral BID   sulfaSALAzine  1,000 mg Oral QID   Continuous Infusions:   LOS: 8 days    Time spent: 25 minutes    Dorcas Carrow, MD [facility]

## 2023-07-29 NOTE — Progress Notes (Signed)
PT Cancellation Note  Patient Details Name: Barry [name] MRN: [id number] DOB: 19-Feb-1946   Cancelled Treatment:    Reason Eval/Treat Not Completed: Patient declined, no reason specified    Faye Ramsay, PT Acute Rehabilitation  Office: [phone]

## 2023-07-30 DIAGNOSIS — N390 Urinary tract infection, site not specified: Secondary | ICD-10-CM | POA: Diagnosis not present

## 2023-07-30 MED ORDER — ENSURE ENLIVE PO LIQD
237.0000 mL | ORAL | Status: DC
  Administered 2023-07-31 – 2023-08-03 (×4): 237 mL via ORAL

## 2023-07-30 NOTE — Progress Notes (Signed)
PROGRESS NOTE    Barry [name]  YSA:[id number] DOB: 04-22-1946 DOA: 07/21/2023 PCP: Georgann Housekeeper, MD    Brief Narrative:  77 year old with obesity, A-fib anticoagulated, coronary artery disease and CABG, ulcerative colitis, chronic anemia, bladder cancer who recently underwent resection of large bladder tumor in July and was scheduled for second stage of surgery on 8/6.  Patient has been declining since last surgery.  8/4, presented to the ER with altered mental status and progressively worsening lethargy.  In the emergency room tachycardic in A-fib.  Hemoglobin baseline.  Urine analysis with infection. 8/6, underwent repeat partial resection of bladder tumor, catheter placement.  Hospital course prolonged because of unsafe disposition plan.   Assessment & Plan:   UTI associated with Foley catheter: Catheter exchanged.  Blood cultures negative.  Urine cultures with E. coli.  Treated with Rocephin.  Now on cefadroxil.  Acute blood loss anemia: Received blood transfusions.  Continue iron supplementation.  Hemoglobin is stable.  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  Unsafe discharge.  Palliative following. Patient is not at end-of-life.  He may benefit with transitioning to a SNF pending clinical improvement.  Continue to work with PT OT.  DVT prophylaxis: SCDs Start: 07/21/23 1446   Code Status: DNR Family Communication: None at the bedside Disposition Plan: Status is: Inpatient Remains inpatient appropriate because: Unsafe discharge disposition plan     Consultants:  Neurology Palliative Hospice  Procedures:  Multiple procedures as above  Antimicrobials:  Multiple antibiotics, currently on cefadroxil   Subjective:  Patient seen and examined.  Denies any complaints.  Objective: Vitals:   07/29/23 1946  07/29/23 2019 07/30/23 0604 07/30/23 0947  BP:  117/65 120/68   Pulse:  (!) 109 89   Resp:  18 18   Temp:  99.5 F (37.5 C) 97.9 F (36.6 C)   TempSrc:  Oral Oral   SpO2: 96% 95% 92% 94%  Weight:      Height:        Intake/Output Summary (Last 24 hours) at 07/30/2023 1248 Last data filed at 07/30/2023 0900 Gross per 24 hour  Intake 300 ml  Output 850 ml  Net -550 ml   Filed Weights   07/21/23 1530 07/23/23 0645  Weight: 84.6 kg 84.6 kg    Examination:  Chronically sick looking.  Not in distress.  On room air. Foley with clear urine.    Data Reviewed: I have personally reviewed following labs and imaging studies  CBC: Recent Labs  Lab 07/25/23 0431 07/26/23 0404 07/27/23 0418 07/27/23 2113 07/28/23 0407  WBC 6.0 5.1 4.0 4.9 4.6  NEUTROABS 4.0 3.3 2.3 3.1 2.8  HGB 7.6* 7.2* 6.8* 8.3* 8.4*  HCT 26.0* 25.2* 23.1* 26.8* 28.0*  MCV 94.9 95.8 95.9 92.4 93.3  PLT 229 198 199 197 208   Basic Metabolic Panel: Recent Labs  Lab 07/25/23 0431 07/26/23 0404 07/27/23 0418 07/28/23 0407  NA 135 135 136 133*  K 3.5 3.1* 4.0 4.1  CL 105 105 107 102  CO2 20* 21* 22 24  GLUCOSE 97 99 110* 91  BUN 12 10 12 13   CREATININE 0.58* 0.53* 0.57* 0.58*  CALCIUM 7.9* 8.0* 8.1* 8.1*   GFR: Estimated Creatinine Clearance: 72 mL/min (A) (by C-G formula based on SCr of 0.58 mg/dL (L)). Liver Function Tests: No results for input(s): "AST", "ALT", "ALKPHOS", "BILITOT", "PROT", "ALBUMIN" in the last 168 hours.  No results for input(s): "LIPASE", "AMYLASE" in the last 168 hours. No results for input(s): "AMMONIA" in the last 168 hours. Coagulation Profile: No results for input(s): "INR", "PROTIME" in the last 168 hours. Cardiac Enzymes: No results for input(s): "CKTOTAL", "CKMB", "CKMBINDEX", "TROPONINI" in the last 168 hours. BNP (last 3 results) No results for input(s): "PROBNP" in the last 8760 hours. HbA1C: No results for input(s): "HGBA1C" in the last 72 hours. CBG: Recent  Labs  Lab 07/28/23 2015  GLUCAP 101*   Lipid Profile: No results for input(s): "CHOL", "HDL", "LDLCALC", "TRIG", "CHOLHDL", "LDLDIRECT" in the last 72 hours. Thyroid Function Tests: No results for input(s): "TSH", "T4TOTAL", "FREET4", "T3FREE", "THYROIDAB" in the last 72 hours. Anemia Panel: No results for input(s): "VITAMINB12", "FOLATE", "FERRITIN", "TIBC", "IRON", "RETICCTPCT" in the last 72 hours. Sepsis Labs: No results for input(s): "PROCALCITON", "LATICACIDVEN" in the last 168 hours.  Recent Results (from the past 240 hour(s))  Blood Culture (routine x 2)     Status: None   Collection Time: 07/21/23 12:05 PM   Specimen: BLOOD  Result Value Ref Range Status   Specimen Description   Final    BLOOD BLOOD RIGHT HAND Performed at [facility], [address] [place], [place] [address]    Special Requests   Final    BOTTLES DRAWN AEROBIC AND ANAEROBIC Blood Culture adequate volume Performed at [facility], [address] [place], [place] [address]    Culture   Final    NO GROWTH 5 DAYS Performed at [facility], [address], [place], [place] [address]    Report Status 07/26/2023 FINAL  Final  Blood Culture (routine x 2)     Status: None   Collection Time: 07/21/23 12:18 PM   Specimen: BLOOD  Result Value Ref Range Status   Specimen Description   Final    BLOOD RIGHT ANTECUBITAL Performed at [facility], [address] [place], [place] [address]    Special Requests   Final    BOTTLES DRAWN AEROBIC AND ANAEROBIC Blood Culture adequate volume Performed at [facility], [address], [place], [place] [address]    Culture   Final    NO GROWTH 5 DAYS Performed at [facility], [address], [place], [place] [address]    Report Status 07/26/2023 FINAL  Final  Urine Culture     Status: Abnormal   Collection Time: 07/21/23 12:35 PM   Specimen: Urine, Random  Result Value Ref  Range Status   Specimen Description   Final    URINE, RANDOM Performed at [facility], [address] [place], [place] [address]    Special Requests   Final    NONE Reflexed from [id number] Performed at [facility], [address], [place], [place] [address]    Culture >=100,000 COLONIES/mL ESCHERICHIA COLI (A)  Final   Report Status 07/23/2023 FINAL  Final   Organism ID, Bacteria ESCHERICHIA COLI (A)  Final      Susceptibility   Escherichia coli - MIC*    AMPICILLIN 4 SENSITIVE Sensitive     CEFAZOLIN <=4 SENSITIVE Sensitive     CEFEPIME <=0.12 SENSITIVE Sensitive     CEFTRIAXONE <=0.25 SENSITIVE Sensitive     CIPROFLOXACIN <=0.25 SENSITIVE Sensitive     GENTAMICIN >=16 RESISTANT Resistant     IMIPENEM <=0.25 SENSITIVE Sensitive     NITROFURANTOIN <=16 SENSITIVE Sensitive     TRIMETH/SULFA <=20 SENSITIVE  Sensitive     AMPICILLIN/SULBACTAM <=2 SENSITIVE Sensitive     PIP/TAZO <=4 SENSITIVE Sensitive     * >=100,000 COLONIES/mL ESCHERICHIA COLI  Resp panel by RT-PCR (RSV, Flu A&B, Covid) Anterior Nasal Swab     Status: None   Collection Time: 07/21/23  2:14 PM   Specimen: Anterior Nasal Swab  Result Value Ref Range Status   SARS Coronavirus 2 by RT PCR NEGATIVE NEGATIVE Final    Comment: (NOTE) SARS-CoV-2 target nucleic acids are NOT DETECTED.  The SARS-CoV-2 RNA is generally detectable in upper respiratory specimens during the acute phase of infection. The lowest concentration of SARS-CoV-2 viral copies this assay can detect is 138 copies/mL. A negative result does not preclude SARS-Cov-2 infection and should not be used as the sole basis for treatment or other patient management decisions. A negative result may occur with  improper specimen collection/handling, submission of specimen other than nasopharyngeal swab, presence of viral mutation(s) within the areas targeted by this assay, and inadequate number of viral copies(<138  copies/mL). A negative result must be combined with clinical observations, patient history, and epidemiological information. The expected result is Negative.  Fact Sheet for Patients:  BloggerCourse.com  Fact Sheet for Healthcare Providers:  SeriousBroker.it  This test is no t yet approved or cleared by the [place] FDA and  has been authorized for detection and/or diagnosis of SARS-CoV-2 by FDA under an Emergency Use Authorization (EUA). This EUA will remain  in effect (meaning this test can be used) for the duration of the COVID-19 declaration under Section 564(b)(1) of the Act, 21 U.S.C.section 360bbb-3(b)(1), unless the authorization is terminated  or revoked sooner.       Influenza A by PCR NEGATIVE NEGATIVE Final   Influenza B by PCR NEGATIVE NEGATIVE Final    Comment: (NOTE) The Xpert Xpress SARS-CoV-2/FLU/RSV plus assay is intended as an aid in the diagnosis of influenza from Nasopharyngeal swab specimens and should not be used as a sole basis for treatment. Nasal washings and aspirates are unacceptable for Xpert Xpress SARS-CoV-2/FLU/RSV testing.  Fact Sheet for Patients: BloggerCourse.com  Fact Sheet for Healthcare Providers: SeriousBroker.it  This test is not yet approved or cleared by the [place] FDA and has been authorized for detection and/or diagnosis of SARS-CoV-2 by FDA under an Emergency Use Authorization (EUA). This EUA will remain in effect (meaning this test can be used) for the duration of the COVID-19 declaration under Section 564(b)(1) of the Act, 21 U.S.C. section 360bbb-3(b)(1), unless the authorization is terminated or revoked.     Resp Syncytial Virus by PCR NEGATIVE NEGATIVE Final    Comment: (NOTE) Fact Sheet for Patients: BloggerCourse.com  Fact Sheet for Healthcare  Providers: SeriousBroker.it  This test is not yet approved or cleared by the [place] FDA and has been authorized for detection and/or diagnosis of SARS-CoV-2 by FDA under an Emergency Use Authorization (EUA). This EUA will remain in effect (meaning this test can be used) for the duration of the COVID-19 declaration under Section 564(b)(1) of the Act, 21 U.S.C. section 360bbb-3(b)(1), unless the authorization is terminated or revoked.  Performed at [facility], [address] [place], [place] [address]   C Difficile Quick Screen w PCR reflex     Status: None   Collection Time: 07/22/23  6:12 AM   Specimen: STOOL  Result Value Ref Range Status   C Diff antigen NEGATIVE NEGATIVE Final   C Diff toxin NEGATIVE NEGATIVE Final   C Diff interpretation  No C. difficile detected.  Final    Comment: Performed at [facility], [address], [place], [place] [address]  Surgical PCR screen     Status: Abnormal   Collection Time: 07/22/23  6:12 AM   Specimen: STOOL; Nasal Swab  Result Value Ref Range Status   MRSA, PCR NEGATIVE NEGATIVE Final   Staphylococcus aureus POSITIVE (A) NEGATIVE Final    Comment: (NOTE) The Xpert SA Assay (FDA approved for NASAL specimens in patients 65 years of age and older), is one component of a comprehensive surveillance program. It is not intended to diagnose infection nor to guide or monitor treatment. Performed at [facility], [address], [place], [place] [address]          Radiology Studies: No results found.      Scheduled Meds:  acetaminophen  1,000 mg Oral TID   atorvastatin  40 mg Oral Daily   cefadroxil  1,000 mg Oral BID   Chlorhexidine Gluconate Cloth  6 each Topical Daily   cholestyramine light  4 g Oral BID   cycloSPORINE  1 drop Both Eyes BID   diclofenac Sodium  2 g Topical QID   diltiazem  120 mg Oral Daily   escitalopram  20 mg Oral  Daily   ezetimibe  10 mg Oral Daily   feeding supplement  237 mL Oral BID BM   ferrous sulfate  325 mg Oral Q breakfast   Gerhardt's butt cream   Topical TID   latanoprost  1 drop Both Eyes QHS   metoprolol tartrate  12.5 mg Oral BID   mometasone-formoterol  2 puff Inhalation BID   montelukast  10 mg Oral QHS   pantoprazole  40 mg Oral Daily   saccharomyces boulardii  250 mg Oral BID   sulfaSALAzine  1,000 mg Oral QID   Continuous Infusions:   LOS: 9 days    Time spent: 25 minutes    Dorcas Carrow, MD [facility]

## 2023-07-30 NOTE — Progress Notes (Signed)
[facility] Liaison Note  [facility] liaison team continues to follow for discharge disposition for hospice services at LTC.  Dionicio Stall, [facility] Liaison [phone]

## 2023-07-30 NOTE — Plan of Care (Signed)
Reinforce education. Monitor labs and vital signs. Monitor output. Catheter care each shift and as needed. Turn q2hrs. Elevate feet. Skin care with incontinent episodes.

## 2023-07-30 NOTE — Care Management Important Message (Signed)
Important Message  Patient Details No IM Letter given due to Hospice following. Name: ABDULAHI [name] MRN: [id number] Date of Birth: September 01, 1946   Medicare Important Message Given:  No     Caren Macadam 07/30/2023, 12:12 PM

## 2023-07-30 NOTE — Progress Notes (Signed)
Nutrition Follow-up  DOCUMENTATION CODES:   Obesity unspecified  INTERVENTION:  - Regular diet.  - Ensure Plus High Protein po once daily, provides 350 kcal and 20 grams of protein. - Encourage intake. - Monitor weight trends.  Nutrition status stable at this time, will sign off. Please re-consult as needed.   NUTRITION DIAGNOSIS:   Unintentional weight loss related to chronic illness, acute illness as evidenced by percent weight loss (11% in ~2 months). *ongoing  GOAL:   Patient will meet greater than or equal to 90% of their needs *met  MONITOR:   PO intake, Supplement acceptance, Weight trends  REASON FOR ASSESSMENT:   Malnutrition Screening Tool    ASSESSMENT:   77 y.o. male with PMH significant for obesity, HTN, HLD, A-fib on anticoagulation, CAD/CABG,  GERD, ulcerative colitis, chronic anemia, asthma, bladder cancer. Recently underwent partial resection of a large bladder tumor in July and was scheduled for second stage on 07/23/2023.  After his surgery in July, he was discharged to rehab but apparently declined physically and mentally and mostly remained bedbound for several days. Presented to the ED with complaint altered mental status, progressively worsening. Admitted for UTI associated with foley catheter.  Patient endorses good appetite and that he has been eating well. Documented to be consuming 75-100$% of meals with average of 94%. Has also been drinking Ensure twice daily. Encouraged patient to continue to eat well. As nutrition status stable, will sign off.    Medications reviewed and include: 325mg  ferrous sulfate  Labs reviewed:  No BMP since 8/11   Diet Order:   Diet Order             Diet regular Room service appropriate? Yes; Fluid consistency: Thin  Diet effective now                   EDUCATION NEEDS:  Education needs have been addressed  Skin:  Skin Assessment: Reviewed RN Assessment Skin Integrity Issues:: Stage II Stage II:  Bilateral buttocks  Last BM:  8/13  Height:  Ht Readings from Last 1 Encounters:  07/23/23 5\' 3"  (1.6 m)   Weight:  Wt Readings from Last 1 Encounters:  07/23/23 84.6 kg    BMI:  Body mass index is 33.04 kg/m.  Estimated Nutritional Needs:  Kcal:  1750-1900 kcals Protein:  65-85 grams Fluid:  >/= 1.8L    Shelle Iron RD, LDN For contact information, refer to [facility].

## 2023-07-30 NOTE — Plan of Care (Signed)

## 2023-07-31 DIAGNOSIS — N390 Urinary tract infection, site not specified: Secondary | ICD-10-CM | POA: Diagnosis not present

## 2023-07-31 NOTE — TOC Progression Note (Signed)
Transition of Care [facility]) - Progression Note    Patient Details  Name: Barry [name] MRN: [id number] Date of Birth: 10-22-1946  [facility] CM/SW Contact  Armanda Heritage, RN Phone Number: 07/31/2023, 3:04 PM  Clinical Narrative:     CM spoke with POA Consuella Lose, regarding DC planning, have made her aware that [facility] is not offering bed at this time and cannot consider patient for LTC.  Went over current active bed offers, Consuella Lose reports hesitance due to ratings for available bed offers. Have encouraged Consuella Lose to tour facilities, as there are no other offers at this time.  Have also outreached to [facility] and their business office has agreed to speak with POA regarding financial questions for LTC. Awaiting outcome.  Expected Discharge Plan: Skilled Nursing Facility Barriers to Discharge: Continued Medical Work up  Expected Discharge Plan and Services       Living arrangements for the past 2 months: Apartment                                       Social Determinants of Health (SDOH) Interventions SDOH Screenings   Food Insecurity: No Food Insecurity (07/21/2023)  Housing: Low Risk  (07/21/2023)  Transportation Needs: No Transportation Needs (07/21/2023)  Utilities: Not At Risk (07/21/2023)  Tobacco Use: Low Risk  (07/23/2023)    Readmission Risk Interventions    07/22/2023    1:05 PM  Readmission Risk Prevention Plan  Transportation Screening Complete  PCP or Specialist Appt within 5-7 Days Complete  Home Care Screening Complete  Medication Review (RN CM) Complete

## 2023-07-31 NOTE — Progress Notes (Signed)
PROGRESS NOTE    Barry PALMBERG  VHQ:[id number] DOB: [birth year] DOA: 07/21/2023 PCP: Georgann Housekeeper, MD    Brief Narrative:  77 year old with obesity, A-fib anticoagulated, coronary artery disease and CABG, ulcerative colitis, chronic anemia, bladder cancer who recently underwent resection of large bladder tumor in July and was scheduled for second stage of surgery on 8/6.  Patient has been declining since last surgery.  8/4, presented to the ER with altered mental status and progressively worsening lethargy.  In the emergency room tachycardic in A-fib.  Hemoglobin baseline.  Urine analysis with infection. 8/6, underwent repeat partial resection of bladder tumor, catheter placement.  Hospital course prolonged because of unsafe disposition plan.   Assessment & Plan:   UTI associated with Foley catheter: Catheter exchanged.  Blood cultures negative.  Urine cultures with E. coli.  Treated with Rocephin.  Now on cefadroxil.  Completed total 10 days of therapy.  Will discontinue.  Acute blood loss anemia: Received blood transfusions.  Continue iron supplementation.  Hemoglobin is stable.  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  Unsafe discharge.  Palliative following. Patient is not at end-of-life.  He may benefit with transitioning to a SNF pending clinical improvement.  Continue to work with PT OT.  DVT prophylaxis: SCDs Start: 07/21/23 1446   Code Status: DNR Family Communication: None at the bedside Disposition Plan: Status is: Inpatient Remains inpatient appropriate because: Unsafe discharge disposition plan     Consultants:  Neurology Palliative Hospice  Procedures:  Multiple procedures as above  Antimicrobials:  Multiple antibiotics, currently on cefadroxil   Subjective:  Patient seen and examined.  No  overnight events.  Poor historian. Objective: Vitals:   07/30/23 2122 07/31/23 0654 07/31/23 0850 07/31/23 1308  BP: 105/64 112/72  111/61  Pulse: (!) 108 88  94  Resp: 20 16  20   Temp: 97.8 F (36.6 C) 98.1 F (36.7 C)  98.6 F (37 C)  TempSrc: Oral Oral  Oral  SpO2: 93% 93% 92% 93%  Weight:      Height:        Intake/Output Summary (Last 24 hours) at 07/31/2023 1329 Last data filed at 07/31/2023 1143 Gross per 24 hour  Intake 840 ml  Output 900 ml  Net -60 ml   Filed Weights   07/21/23 1530 07/23/23 0645  Weight: 84.6 kg 84.6 kg    Examination:  Chronically sick looking gentleman.  On room air.  Not in distress. Urinary catheter with clear urine.    Data Reviewed: I have personally reviewed following labs and imaging studies  CBC: Recent Labs  Lab 07/25/23 0431 07/26/23 0404 07/27/23 0418 07/27/23 2113 07/28/23 0407  WBC 6.0 5.1 4.0 4.9 4.6  NEUTROABS 4.0 3.3 2.3 3.1 2.8  HGB 7.6* 7.2* 6.8* 8.3* 8.4*  HCT 26.0* 25.2* 23.1* 26.8* 28.0*  MCV 94.9 95.8 95.9 92.4 93.3  PLT 229 198 199 197 208   Basic Metabolic Panel: Recent Labs  Lab 07/25/23 0431 07/26/23 0404 07/27/23 0418 07/28/23 0407  NA 135 135 136 133*  K 3.5 3.1* 4.0 4.1  CL 105 105 107 102  CO2 20* 21* 22 24  GLUCOSE 97 99 110* 91  BUN 12 10 12 13   CREATININE 0.58* 0.53* 0.57* 0.58*  CALCIUM 7.9* 8.0* 8.1* 8.1*   GFR: Estimated Creatinine Clearance: 72 mL/min (A) (by C-G formula based on SCr of 0.58 mg/dL (L)). Liver  Function Tests: No results for input(s): "AST", "ALT", "ALKPHOS", "BILITOT", "PROT", "ALBUMIN" in the last 168 hours. No results for input(s): "LIPASE", "AMYLASE" in the last 168 hours. No results for input(s): "AMMONIA" in the last 168 hours. Coagulation Profile: No results for input(s): "INR", "PROTIME" in the last 168 hours. Cardiac Enzymes: No results for input(s): "CKTOTAL", "CKMB", "CKMBINDEX", "TROPONINI" in the last 168 hours. BNP (last 3 results) No results for  input(s): "PROBNP" in the last 8760 hours. HbA1C: No results for input(s): "HGBA1C" in the last 72 hours. CBG: Recent Labs  Lab 07/28/23 2015  GLUCAP 101*   Lipid Profile: No results for input(s): "CHOL", "HDL", "LDLCALC", "TRIG", "CHOLHDL", "LDLDIRECT" in the last 72 hours. Thyroid Function Tests: No results for input(s): "TSH", "T4TOTAL", "FREET4", "T3FREE", "THYROIDAB" in the last 72 hours. Anemia Panel: No results for input(s): "VITAMINB12", "FOLATE", "FERRITIN", "TIBC", "IRON", "RETICCTPCT" in the last 72 hours. Sepsis Labs: No results for input(s): "PROCALCITON", "LATICACIDVEN" in the last 168 hours.  Recent Results (from the past 240 hour(s))  Resp panel by RT-PCR (RSV, Flu A&B, Covid) Anterior Nasal Swab     Status: None   Collection Time: 07/21/23  2:14 PM   Specimen: Anterior Nasal Swab  Result Value Ref Range Status   SARS Coronavirus 2 by RT PCR NEGATIVE NEGATIVE Final    Comment: (NOTE) SARS-CoV-2 target nucleic acids are NOT DETECTED.  The SARS-CoV-2 RNA is generally detectable in upper respiratory specimens during the acute phase of infection. The lowest concentration of SARS-CoV-2 viral copies this assay can detect is 138 copies/mL. A negative result does not preclude SARS-Cov-2 infection and should not be used as the sole basis for treatment or other patient management decisions. A negative result may occur with  improper specimen collection/handling, submission of specimen other than nasopharyngeal swab, presence of viral mutation(s) within the areas targeted by this assay, and inadequate number of viral copies(<138 copies/mL). A negative result must be combined with clinical observations, patient history, and epidemiological information. The expected result is Negative.  Fact Sheet for Patients:  BloggerCourse.com  Fact Sheet for Healthcare Providers:  SeriousBroker.it  This test is no t yet approved or  cleared by the [place] FDA and  has been authorized for detection and/or diagnosis of SARS-CoV-2 by FDA under an Emergency Use Authorization (EUA). This EUA will remain  in effect (meaning this test can be used) for the duration of the COVID-19 declaration under Section 564(b)(1) of the Act, 21 U.S.C.section 360bbb-3(b)(1), unless the authorization is terminated  or revoked sooner.       Influenza A by PCR NEGATIVE NEGATIVE Final   Influenza B by PCR NEGATIVE NEGATIVE Final    Comment: (NOTE) The Xpert Xpress SARS-CoV-2/FLU/RSV plus assay is intended as an aid in the diagnosis of influenza from Nasopharyngeal swab specimens and should not be used as a sole basis for treatment. Nasal washings and aspirates are unacceptable for Xpert Xpress SARS-CoV-2/FLU/RSV testing.  Fact Sheet for Patients: BloggerCourse.com  Fact Sheet for Healthcare Providers: SeriousBroker.it  This test is not yet approved or cleared by the [place] FDA and has been authorized for detection and/or diagnosis of SARS-CoV-2 by FDA under an Emergency Use Authorization (EUA). This EUA will remain in effect (meaning this test can be used) for the duration of the COVID-19 declaration under Section 564(b)(1) of the Act, 21 U.S.C. section 360bbb-3(b)(1), unless the authorization is terminated or revoked.     Resp Syncytial Virus by PCR NEGATIVE NEGATIVE Final    Comment: (  NOTE) Fact Sheet for Patients: BloggerCourse.com  Fact Sheet for Healthcare Providers: SeriousBroker.it  This test is not yet approved or cleared by the [place] FDA and has been authorized for detection and/or diagnosis of SARS-CoV-2 by FDA under an Emergency Use Authorization (EUA). This EUA will remain in effect (meaning this test can be used) for the duration of the COVID-19 declaration under Section 564(b)(1) of the Act, 21  U.S.C. section 360bbb-3(b)(1), unless the authorization is terminated or revoked.  Performed at [facility], [address] [place], [place] [address]   C Difficile Quick Screen w PCR reflex     Status: None   Collection Time: 07/22/23  6:12 AM   Specimen: STOOL  Result Value Ref Range Status   C Diff antigen NEGATIVE NEGATIVE Final   C Diff toxin NEGATIVE NEGATIVE Final   C Diff interpretation No C. difficile detected.  Final    Comment: Performed at [facility], [address] [place], [place] [address]  Surgical PCR screen     Status: Abnormal   Collection Time: 07/22/23  6:12 AM   Specimen: STOOL; Nasal Swab  Result Value Ref Range Status   MRSA, PCR NEGATIVE NEGATIVE Final   Staphylococcus aureus POSITIVE (A) NEGATIVE Final    Comment: (NOTE) The Xpert SA Assay (FDA approved for NASAL specimens in patients 8 years of age and older), is one component of a comprehensive surveillance program. It is not intended to diagnose infection nor to guide or monitor treatment. Performed at [facility], [address], [place], [place] [address]          Radiology Studies: No results found.      Scheduled Meds:  acetaminophen  1,000 mg Oral TID   atorvastatin  40 mg Oral Daily   Chlorhexidine Gluconate Cloth  6 each Topical Daily   cholestyramine light  4 g Oral BID   cycloSPORINE  1 drop Both Eyes BID   diclofenac Sodium  2 g Topical QID   diltiazem  120 mg Oral Daily   escitalopram  20 mg Oral Daily   ezetimibe  10 mg Oral Daily   feeding supplement  237 mL Oral Q24H   ferrous sulfate  325 mg Oral Q breakfast   Gerhardt's butt cream   Topical TID   latanoprost  1 drop Both Eyes QHS   metoprolol tartrate  12.5 mg Oral BID   mometasone-formoterol  2 puff Inhalation BID   montelukast  10 mg Oral QHS   pantoprazole  40 mg Oral Daily   saccharomyces boulardii  250 mg Oral BID   sulfaSALAzine  1,000 mg Oral  QID   Continuous Infusions:   LOS: 10 days    Time spent: 25 minutes    Dorcas Carrow, MD [facility]

## 2023-07-31 NOTE — Plan of Care (Signed)

## 2023-07-31 NOTE — Plan of Care (Signed)
  Problem: Clinical Measurements: Goal: Will remain free from infection Outcome: Progressing   Problem: Nutrition: Goal: Adequate nutrition will be maintained Outcome: Progressing   Problem: Elimination: Goal: Will not experience complications related to urinary retention Outcome: Progressing   Problem: Pain Managment: Goal: General experience of comfort will improve Outcome: Progressing   Problem: Safety: Goal: Ability to remain free from injury will improve Outcome: Progressing   Problem: Skin Integrity: Goal: Risk for impaired skin integrity will decrease Outcome: Progressing

## 2023-08-01 DIAGNOSIS — N390 Urinary tract infection, site not specified: Secondary | ICD-10-CM | POA: Diagnosis not present

## 2023-08-01 NOTE — Progress Notes (Signed)
Physical Therapy Treatment Patient Details Name: Barry [name] MRN: [id number] DOB: 1946/03/07 Today's Date: 08/01/2023   History of Present Illness 77 yo male admitted with UTI, AMS. S/P TURBT 07/23/23. Hx of TURBT 06/28/23. Afib, CAD, obesity, severe OA, UC, CABG, paralyzed hemidiaphragm, falls    PT Comments  Pt tolerates ankle pumps and SLR, limited AROM with significant BLE genu valgus noted. Pt noted to be soiled in bed with BM and unaware. Pt needs mod A for rolling R/L, using handrails, cues for sequencing, needing LE assist. Pt fatigued after bed mobility and 2 supine exercises. Pt cognitively able to follow 1 step commands, does need oriented to being in hospital and date. Pt reports feeling "off" today. Will continue to progress as able.   If plan is discharge home, recommend the following: Two people to help with walking and/or transfers;Two people to help with bathing/dressing/bathroom;Help with stairs or ramp for entrance;Assist for transportation;Assistance with cooking/housework;Direct supervision/assist for financial management   Can travel by private vehicle     No  Equipment Recommendations  None recommended by PT    Recommendations for Other Services       Precautions / Restrictions Precautions Precautions: Fall Precaution Comments: limited bil knee flexion; severely arthritic Restrictions Weight Bearing Restrictions: No     Mobility  Bed Mobility Overal bed mobility: Needs Assistance Bed Mobility: Rolling Rolling: Mod assist, Used rails         General bed mobility comments: pt rolling R/L for linen change, mod A, multimodal cues for hand placement and assist with LE positioning    Transfers                        Ambulation/Gait                   Stairs             Wheelchair Mobility     Tilt Bed    Modified Rankin (Stroke Patients Only)       Balance                                             Cognition Arousal: Alert Behavior During Therapy: WFL for tasks assessed/performed Overall Cognitive Status: No family/caregiver present to determine baseline cognitive functioning                                 General Comments: Pt reports feeling "off". Pt states month is March, needing oriented to August twice then pt able to remember. Pt able to follow 1 step commands consistently.        Exercises General Exercises - Lower Extremity Ankle Circles/Pumps: AROM, Both, 10 reps, Supine Straight Leg Raises: AROM, Strengthening, Both, 5 reps, Supine    General Comments        Pertinent Vitals/Pain Pain Assessment Pain Assessment: No/denies pain    Home Living                          Prior Function            PT Goals (current goals can now be found in the care plan section) Acute Rehab PT Goals Patient Stated Goal: per friend, meet with MD, palliative care to discuss plan PT  Goal Formulation: With patient/family Time For Goal Achievement: 08/07/23 Potential to Achieve Goals: Poor Progress towards PT goals: Progressing toward goals (limited)    Frequency    Min 1X/week      PT Plan      Co-evaluation              AM-PAC PT "6 Clicks" Mobility   Outcome Measure  Help needed turning from your back to your side while in a flat bed without using bedrails?: A Lot Help needed moving from lying on your back to sitting on the side of a flat bed without using bedrails?: Total Help needed moving to and from a bed to a chair (including a wheelchair)?: Total Help needed standing up from a chair using your arms (e.g., wheelchair or bedside chair)?: Total Help needed to walk in hospital room?: Total Help needed climbing 3-5 steps with a railing? : Total 6 Click Score: 7    End of Session   Activity Tolerance: Patient tolerated treatment well;Patient limited by fatigue Patient left: in bed;with call bell/phone within reach;with  nursing/sitter in room Nurse Communication: Mobility status PT Visit Diagnosis: Other abnormalities of gait and mobility (R26.89);Muscle weakness (generalized) (M62.81);Pain;History of falling (Z91.81)     Time: 2956-2130 PT Time Calculation (min) (ACUTE ONLY): 21 min  Charges:    $Therapeutic Activity: 8-22 mins PT General Charges $$ ACUTE PT VISIT: 1 Visit                     Tori  PT, DPT 08/01/23, 12:42 PM

## 2023-08-01 NOTE — TOC Progression Note (Signed)
Transition of Care [facility]) - Progression Note    Patient Details  Name: Barry [name] MRN: [id number] Date of Birth: [birth year]  [facility] CM/SW Contact  Armanda Heritage, RN Phone Number: 08/01/2023, 1:24 PM  Clinical Narrative:     CM followed up with POA [name],who reports at this time [facility] has not called her as expected.  CM made several attempts to reach [facility] [place] rep unsuccessfully.  Consuella Lose reports she has gone to view [facility] today and their admissions rep informed her that they cannot accept patient with medicaid pending.  [facility] has rescinded offer. Consuella Lose reports she is planning to visit [place] tomorrow and cannot accept offers from [facility] due to ratings/abuse alert on [facility].    CM spoke with rep at [facility] and went over current dc needs.  Rep reports that facility can consider for admission under medicare benefits for st rehab with plan to convert to LTC care, however Consuella Lose will need to visit facility to discuss with business office to go over financials.  Consuella Lose has agreed to visit facility today, she anticipates arriving around 230 pm.  Admissions rep made aware. Awaiting outcome.  Expected Discharge Plan: Skilled Nursing Facility Barriers to Discharge: Continued Medical Work up  Expected Discharge Plan and Services       Living arrangements for the past 2 months: Apartment                                       Social Determinants of Health (SDOH) Interventions SDOH Screenings   Food Insecurity: No Food Insecurity (07/21/2023)  Housing: Low Risk  (07/21/2023)  Transportation Needs: No Transportation Needs (07/21/2023)  Utilities: Not At Risk (07/21/2023)  Tobacco Use: Low Risk  (07/23/2023)    Readmission Risk Interventions    07/22/2023    1:05 PM  Readmission Risk Prevention Plan  Transportation Screening Complete  PCP or Specialist Appt within 5-7 Days Complete  Home Care Screening Complete   Medication Review (RN CM) Complete

## 2023-08-01 NOTE — Plan of Care (Signed)

## 2023-08-01 NOTE — Progress Notes (Signed)
PROGRESS NOTE    Barry [name]  ZOX:[id number] DOB: 1946/09/08 DOA: 07/21/2023 PCP: Georgann Housekeeper, MD    Brief Narrative:  77 year old with obesity, A-fib anticoagulated, coronary artery disease and CABG, ulcerative colitis, chronic anemia, bladder cancer who recently underwent resection of large bladder tumor in July and was scheduled for second stage of surgery on 8/6.  Patient has been declining since last surgery.  8/4, presented to the ER with altered mental status and progressively worsening lethargy.  In the emergency room tachycardic in A-fib.  Hemoglobin baseline.  Urine analysis with infection. 8/6, underwent repeat partial resection of bladder tumor, catheter placement.  Hospital course prolonged because of unsafe disposition plan.   Assessment & Plan:   UTI associated with Foley catheter: Catheter exchanged.  Blood cultures negative.  Urine cultures with E. coli.  Treated with Rocephin.  Completed 10 days of antibiotics.  Acute blood loss anemia: Received blood transfusions.  Continue iron supplementation.  Hemoglobin is stable.  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  Unsafe discharge.  Palliative following. Continue to work with PT OT.  May need short-term rehab placement.  DVT prophylaxis: SCDs Start: 07/21/23 1446   Code Status: DNR Family Communication: None at the bedside Disposition Plan: Status is: Inpatient Remains inpatient appropriate because: Unsafe discharge disposition plan     Consultants:  Neurology Palliative Hospice  Procedures:  Multiple procedures as above  Antimicrobials:  Multiple antibiotics, currently on cefadroxil   Subjective: Patient seen and examined.  No overnight events.  Poor historian but denies any nausea vomiting or pain. Objective: Vitals:   07/31/23 1932  07/31/23 2029 08/01/23 0557 08/01/23 0755  BP: 120/64  131/72   Pulse: 95  83   Resp: 10  17   Temp: 98 F (36.7 C)  98.1 F (36.7 C)   TempSrc: Oral  Oral   SpO2: 94% 93% 94% 97%  Weight:      Height:        Intake/Output Summary (Last 24 hours) at 08/01/2023 1219 Last data filed at 08/01/2023 8119 Gross per 24 hour  Intake 565 ml  Output 1900 ml  Net -1335 ml   Filed Weights   07/21/23 1530 07/23/23 0645  Weight: 84.6 kg 84.6 kg    Examination:  Chronically sick looking.  Not in any distress.  Slow to respond but interactive. Foley catheter with clear urine.   Data Reviewed: I have personally reviewed following labs and imaging studies  CBC: Recent Labs  Lab 07/26/23 0404 07/27/23 0418 07/27/23 2113 07/28/23 0407  WBC 5.1 4.0 4.9 4.6  NEUTROABS 3.3 2.3 3.1 2.8  HGB 7.2* 6.8* 8.3* 8.4*  HCT 25.2* 23.1* 26.8* 28.0*  MCV 95.8 95.9 92.4 93.3  PLT 198 199 197 208   Basic Metabolic Panel: Recent Labs  Lab 07/26/23 0404 07/27/23 0418 07/28/23 0407  NA 135 136 133*  K 3.1* 4.0 4.1  CL 105 107 102  CO2 21* 22 24  GLUCOSE 99 110* 91  BUN 10 12 13   CREATININE 0.53* 0.57* 0.58*  CALCIUM 8.0* 8.1* 8.1*   GFR: Estimated Creatinine Clearance: 72 mL/min (A) (by C-G formula based on SCr of 0.58 mg/dL (L)). Liver Function Tests: No results for input(s): "AST", "ALT", "ALKPHOS", "BILITOT", "PROT", "ALBUMIN" in the last 168 hours. No results for input(s): "LIPASE", "AMYLASE" in the last 168 hours. No results for input(s): "AMMONIA" in the  last 168 hours. Coagulation Profile: No results for input(s): "INR", "PROTIME" in the last 168 hours. Cardiac Enzymes: No results for input(s): "CKTOTAL", "CKMB", "CKMBINDEX", "TROPONINI" in the last 168 hours. BNP (last 3 results) No results for input(s): "PROBNP" in the last 8760 hours. HbA1C: No results for input(s): "HGBA1C" in the last 72 hours. CBG: Recent Labs  Lab 07/28/23 2015  GLUCAP 101*   Lipid Profile: No  results for input(s): "CHOL", "HDL", "LDLCALC", "TRIG", "CHOLHDL", "LDLDIRECT" in the last 72 hours. Thyroid Function Tests: No results for input(s): "TSH", "T4TOTAL", "FREET4", "T3FREE", "THYROIDAB" in the last 72 hours. Anemia Panel: No results for input(s): "VITAMINB12", "FOLATE", "FERRITIN", "TIBC", "IRON", "RETICCTPCT" in the last 72 hours. Sepsis Labs: No results for input(s): "PROCALCITON", "LATICACIDVEN" in the last 168 hours.  No results found for this or any previous visit (from the past 240 hour(s)).        Radiology Studies: No results found.      Scheduled Meds:  acetaminophen  1,000 mg Oral TID   atorvastatin  40 mg Oral Daily   Chlorhexidine Gluconate Cloth  6 each Topical Daily   cholestyramine light  4 g Oral BID   cycloSPORINE  1 drop Both Eyes BID   diclofenac Sodium  2 g Topical QID   diltiazem  120 mg Oral Daily   escitalopram  20 mg Oral Daily   ezetimibe  10 mg Oral Daily   feeding supplement  237 mL Oral Q24H   ferrous sulfate  325 mg Oral Q breakfast   Gerhardt's butt cream   Topical TID   latanoprost  1 drop Both Eyes QHS   metoprolol tartrate  12.5 mg Oral BID   mometasone-formoterol  2 puff Inhalation BID   montelukast  10 mg Oral QHS   pantoprazole  40 mg Oral Daily   saccharomyces boulardii  250 mg Oral BID   sulfaSALAzine  1,000 mg Oral QID   Continuous Infusions:   LOS: 11 days    Time spent: 25 minutes    Dorcas Carrow, MD [facility]

## 2023-08-02 DIAGNOSIS — N390 Urinary tract infection, site not specified: Secondary | ICD-10-CM | POA: Diagnosis not present

## 2023-08-02 NOTE — TOC Progression Note (Addendum)
Transition of Care [facility]) - Progression Note    Patient Details  Name: [name] MRN: [id number] Date of Birth: 05/08/1946  [facility]) CM/SW Contact  Howell Rucks, RN Phone Number: 08/02/2023, 10:55 AM  Clinical Narrative:  Jory Sims supervisor Sharol Roussel) confirmed with [facility] bed will be available for transfer today or tomorrow.   Anticipate dc tomorrow.       Expected Discharge Plan: Skilled Nursing Facility Barriers to Discharge: Continued Medical Work up  Expected Discharge Plan and Services       Living arrangements for the past 2 months: Apartment                                       Social Determinants of Health (SDOH) Interventions SDOH Screenings   Food Insecurity: No Food Insecurity (07/21/2023)  Housing: Low Risk  (07/21/2023)  Transportation Needs: No Transportation Needs (07/21/2023)  Utilities: Not At Risk (07/21/2023)  Tobacco Use: Low Risk  (07/23/2023)    Readmission Risk Interventions    07/22/2023    1:05 PM  Readmission Risk Prevention Plan  Transportation Screening Complete  PCP or Specialist Appt within 5-7 Days Complete  Home Care Screening Complete  Medication Review (RN CM) Complete

## 2023-08-02 NOTE — Care Management Important Message (Signed)
Important Message  Patient Details IM Letter given. Name: Barry [name] MRN: [id number] Date of Birth: June 22, 1946   Medicare Important Message Given:  Yes     Caren Macadam 08/02/2023, 2:17 PM

## 2023-08-02 NOTE — Progress Notes (Signed)
[facility] Liaison Note  Notified by [facility] manager of patient/family request for [facility] palliative services at LTC after discharge.   [facility] hospital liaison will follow patient for discharge disposition.   Please call with any hospice or outpatient palliative care related questions.   Thank you for the opportunity to participate in this patient's care.   Dionicio Stall, [facility] Liaison [phone]

## 2023-08-02 NOTE — Plan of Care (Signed)

## 2023-08-02 NOTE — Progress Notes (Signed)
PROGRESS NOTE    HARLES [name]  RUE:[id number] DOB: 1946-11-10 DOA: 07/21/2023 PCP: Georgann Housekeeper, MD    Brief Narrative:  77 year old with obesity, A-fib, coronary artery disease and CABG, ulcerative colitis, chronic anemia, bladder cancer who recently underwent resection of large bladder tumor in July and was scheduled for second stage of surgery on 8/6.  Patient has been declining since last surgery.  8/4, presented to the ER with altered mental status and progressively worsening lethargy.  In the emergency room tachycardic in A-fib.  Hemoglobin baseline.  Urine analysis with infection. 8/6, underwent repeat partial resection of bladder tumor, catheter placement.  Patient remained in very poor health with poor clinical recovery.  Decision was made not to undergo further surgeries. Plan is to discharge to [facility] 8/17 with short-term rehab and ultimate plan to keep him as long-term care.  Palliative to follow-up at [facility].  Treated for following conditions.   Assessment & Plan:   UTI associated with Foley catheter: Catheter exchanged.  Blood cultures negative.  Urine cultures with E. coli.  Treated with Rocephin.  Completed 10 days of antibiotics.  Will keep Foley catheter for comfort and urinary retention.  Acute blood loss anemia: Received blood transfusions.  Continue iron supplementation.  Hemoglobin is stable.  Progressive urothelial carcinoma of the bladder  Chronic diarrhea and ulcerative colitis Failure to thrive Recurrent metabolic encephalopathy See goal of care discussion below.  Patient is not a candidate for aggressive cancer therapies.  Remains frail debilitated.  Discussed in detail with case management, patient's healthcare power of attorney. -Patient has been approved for short-term rehab at [facility].  Patient's healthcare power of attorney needs today to complete paperwork and finances so that patient can be transferred tomorrow to [facility]. -Discharge to [facility] tomorrow for short-term rehab.  Consult palliative care at the nursing home.   DVT prophylaxis: SCDs Start: 07/21/23 1446   Code Status: DNR Family Communication: Healthcare power of attorney Ms. Consuella Lose is on the phone. Disposition Plan: Status is: Inpatient Remains inpatient appropriate because: Unsafe discharge disposition plan.  Anticipate discharge tomorrow.     Consultants:  Neurology Palliative Hospice  Procedures:  Multiple procedures as above  Antimicrobials:  Completed antibiotics.   Subjective:  Patient was seen and examined.  No overnight events.  Remains a poor historian.  Denies any complaints.  Nursing reported no overnight concerns.   Objective: Vitals:   08/01/23 2040 08/02/23 0619 08/02/23 0855 08/02/23 1006  BP: 117/68 126/84  107/67  Pulse: 98 (!) 103  (!) 109  Resp: 20 20    Temp: 98.3 F (36.8 C) 98.7 F (37.1 C)    TempSrc: Oral Oral    SpO2: 95% 94% 91%   Weight:      Height:        Intake/Output Summary (Last 24 hours) at 08/02/2023 1339 Last data filed at 08/02/2023 0617 Gross per 24 hour  Intake --  Output 1600 ml  Net -1600 ml   Filed Weights   07/21/23 1530 07/23/23 0645  Weight: 84.6 kg 84.6 kg    Examination:  Chronically sick looking.  Not in any distress.  Slow to respond but interactive. Foley catheter with clear urine. Grossly generalized weak. Remains on room air.  Assist feeding.   Data Reviewed: I have personally reviewed following labs and imaging studies  CBC: Recent Labs  Lab 07/27/23 0418 07/27/23 2113 07/28/23 0407  WBC 4.0 4.9 4.6  NEUTROABS 2.3 3.1 2.8  HGB 6.8* 8.3* 8.4*  HCT 23.1* 26.8* 28.0*  MCV 95.9 92.4 93.3  PLT 199 197 208   Basic Metabolic Panel: Recent Labs  Lab 07/27/23 0418 07/28/23 0407  NA 136 133*  K 4.0 4.1  CL 107 102  CO2 22 24  GLUCOSE 110* 91  BUN 12 13  CREATININE 0.57* 0.58*  CALCIUM 8.1* 8.1*   GFR: Estimated Creatinine  Clearance: 72 mL/min (A) (by C-G formula based on SCr of 0.58 mg/dL (L)). Liver Function Tests: No results for input(s): "AST", "ALT", "ALKPHOS", "BILITOT", "PROT", "ALBUMIN" in the last 168 hours. No results for input(s): "LIPASE", "AMYLASE" in the last 168 hours. No results for input(s): "AMMONIA" in the last 168 hours. Coagulation Profile: No results for input(s): "INR", "PROTIME" in the last 168 hours. Cardiac Enzymes: No results for input(s): "CKTOTAL", "CKMB", "CKMBINDEX", "TROPONINI" in the last 168 hours. BNP (last 3 results) No results for input(s): "PROBNP" in the last 8760 hours. HbA1C: No results for input(s): "HGBA1C" in the last 72 hours. CBG: Recent Labs  Lab 07/28/23 2015  GLUCAP 101*   Lipid Profile: No results for input(s): "CHOL", "HDL", "LDLCALC", "TRIG", "CHOLHDL", "LDLDIRECT" in the last 72 hours. Thyroid Function Tests: No results for input(s): "TSH", "T4TOTAL", "FREET4", "T3FREE", "THYROIDAB" in the last 72 hours. Anemia Panel: No results for input(s): "VITAMINB12", "FOLATE", "FERRITIN", "TIBC", "IRON", "RETICCTPCT" in the last 72 hours. Sepsis Labs: No results for input(s): "PROCALCITON", "LATICACIDVEN" in the last 168 hours.  No results found for this or any previous visit (from the past 240 hour(s)).        Radiology Studies: No results found.      Scheduled Meds:  acetaminophen  1,000 mg Oral TID   atorvastatin  40 mg Oral Daily   Chlorhexidine Gluconate Cloth  6 each Topical Daily   cholestyramine light  4 g Oral BID   cycloSPORINE  1 drop Both Eyes BID   diclofenac Sodium  2 g Topical QID   diltiazem  120 mg Oral Daily   escitalopram  20 mg Oral Daily   ezetimibe  10 mg Oral Daily   feeding supplement  237 mL Oral Q24H   ferrous sulfate  325 mg Oral Q breakfast   Gerhardt's butt cream   Topical TID   latanoprost  1 drop Both Eyes QHS   metoprolol tartrate  12.5 mg Oral BID   mometasone-formoterol  2 puff Inhalation BID    montelukast  10 mg Oral QHS   pantoprazole  40 mg Oral Daily   saccharomyces boulardii  250 mg Oral BID   sulfaSALAzine  1,000 mg Oral QID   Continuous Infusions:   LOS: 12 days    Time spent: 25 minutes    Dorcas Carrow, MD [facility]

## 2023-08-02 NOTE — TOC Progression Note (Addendum)
Transition of Care [facility]) - Progression Note    Patient Details  Name: Barry [name] MRN: [id number] Date of Birth: 04/21/1946  [facility] CM/SW Contact  Armanda Heritage, RN Phone Number: 08/02/2023, 9:17 AM  Clinical Narrative:     CM followed up with [facility] rep [facility] who reports Consuella Lose met with facility yesterday and everything looks good for admission.  Star additionally reports that Consuella Lose shared that patient will not come until Saturday 8/16.    CM followed up with Consuella Lose who has accepted the bed offer from [facility], but is insistent that she believes the patient is not ready to discharge until tomorrow.  Reports wishes to speak with MD regarding medical stability and reiterates that she is not in agreement with a discharge earlier than Saturday.  MD notified with request to contact [facility].   Expected Discharge Plan: Skilled Nursing Facility Barriers to Discharge: Continued Medical Work up  Expected Discharge Plan and Services       Living arrangements for the past 2 months: Apartment                                       Social Determinants of Health (SDOH) Interventions SDOH Screenings   Food Insecurity: No Food Insecurity (07/21/2023)  Housing: Low Risk  (07/21/2023)  Transportation Needs: No Transportation Needs (07/21/2023)  Utilities: Not At Risk (07/21/2023)  Tobacco Use: Low Risk  (07/23/2023)    Readmission Risk Interventions    07/22/2023    1:05 PM  Readmission Risk Prevention Plan  Transportation Screening Complete  PCP or Specialist Appt within 5-7 Days Complete  Home Care Screening Complete  Medication Review (RN CM) Complete

## 2023-08-02 NOTE — Plan of Care (Signed)
  Problem: Clinical Measurements: Goal: Ability to maintain clinical measurements within normal limits will improve Outcome: Progressing Goal: Will remain free from infection Outcome: Progressing Goal: Diagnostic test results will improve Outcome: Progressing Goal: Respiratory complications will improve Outcome: Progressing Goal: Cardiovascular complication will be avoided Outcome: Progressing   Problem: Nutrition: Goal: Adequate nutrition will be maintained Outcome: Progressing   Problem: Coping: Goal: Level of anxiety will decrease Outcome: Progressing   Problem: Elimination: Goal: Will not experience complications related to urinary retention Outcome: Progressing   Problem: Pain Managment: Goal: General experience of comfort will improve Outcome: Progressing   Problem: Safety: Goal: Ability to remain free from injury will improve Outcome: Progressing   Problem: Clinical Measurements: Goal: Ability to maintain clinical measurements within normal limits will improve Outcome: Progressing Goal: Will remain free from infection Outcome: Progressing Goal: Diagnostic test results will improve Outcome: Progressing Goal: Respiratory complications will improve Outcome: Progressing Goal: Cardiovascular complication will be avoided Outcome: Progressing   Problem: Nutrition: Goal: Adequate nutrition will be maintained Outcome: Progressing   Problem: Coping: Goal: Level of anxiety will decrease Outcome: Progressing   Problem: Elimination: Goal: Will not experience complications related to urinary retention Outcome: Progressing   Problem: Pain Managment: Goal: General experience of comfort will improve Outcome: Progressing   Problem: Safety: Goal: Ability to remain free from injury will improve Outcome: Progressing   Problem: Clinical Measurements: Goal: Ability to maintain clinical measurements within normal limits will improve Outcome: Progressing Goal: Will  remain free from infection Outcome: Progressing Goal: Diagnostic test results will improve Outcome: Progressing Goal: Respiratory complications will improve Outcome: Progressing Goal: Cardiovascular complication will be avoided Outcome: Progressing   Problem: Nutrition: Goal: Adequate nutrition will be maintained Outcome: Progressing   Problem: Coping: Goal: Level of anxiety will decrease Outcome: Progressing   Problem: Elimination: Goal: Will not experience complications related to urinary retention Outcome: Progressing   Problem: Pain Managment: Goal: General experience of comfort will improve Outcome: Progressing   Problem: Safety: Goal: Ability to remain free from injury will improve Outcome: Progressing

## 2023-08-03 DIAGNOSIS — D62 Acute posthemorrhagic anemia: Secondary | ICD-10-CM | POA: Diagnosis not present

## 2023-08-03 DIAGNOSIS — L89302 Pressure ulcer of unspecified buttock, stage 2: Secondary | ICD-10-CM | POA: Diagnosis not present

## 2023-08-03 DIAGNOSIS — F32A Depression, unspecified: Secondary | ICD-10-CM | POA: Diagnosis not present

## 2023-08-03 DIAGNOSIS — I959 Hypotension, unspecified: Secondary | ICD-10-CM | POA: Diagnosis not present

## 2023-08-03 DIAGNOSIS — D630 Anemia in neoplastic disease: Secondary | ICD-10-CM | POA: Diagnosis not present

## 2023-08-03 DIAGNOSIS — M25569 Pain in unspecified knee: Secondary | ICD-10-CM | POA: Diagnosis not present

## 2023-08-03 DIAGNOSIS — Z960 Presence of urogenital implants: Secondary | ICD-10-CM | POA: Diagnosis not present

## 2023-08-03 DIAGNOSIS — J4530 Mild persistent asthma, uncomplicated: Secondary | ICD-10-CM | POA: Diagnosis not present

## 2023-08-03 DIAGNOSIS — Z7401 Bed confinement status: Secondary | ICD-10-CM | POA: Diagnosis not present

## 2023-08-03 DIAGNOSIS — J80 Acute respiratory distress syndrome: Secondary | ICD-10-CM | POA: Diagnosis not present

## 2023-08-03 DIAGNOSIS — Y846 Urinary catheterization as the cause of abnormal reaction of the patient, or of later complication, without mention of misadventure at the time of the procedure: Secondary | ICD-10-CM | POA: Diagnosis present

## 2023-08-03 DIAGNOSIS — G9341 Metabolic encephalopathy: Secondary | ICD-10-CM | POA: Diagnosis not present

## 2023-08-03 DIAGNOSIS — J952 Acute pulmonary insufficiency following nonthoracic surgery: Secondary | ICD-10-CM | POA: Diagnosis not present

## 2023-08-03 DIAGNOSIS — H1131 Conjunctival hemorrhage, right eye: Secondary | ICD-10-CM | POA: Diagnosis not present

## 2023-08-03 DIAGNOSIS — Z951 Presence of aortocoronary bypass graft: Secondary | ICD-10-CM | POA: Diagnosis not present

## 2023-08-03 DIAGNOSIS — A419 Sepsis, unspecified organism: Secondary | ICD-10-CM | POA: Diagnosis not present

## 2023-08-03 DIAGNOSIS — C679 Malignant neoplasm of bladder, unspecified: Secondary | ICD-10-CM | POA: Diagnosis not present

## 2023-08-03 DIAGNOSIS — N390 Urinary tract infection, site not specified: Secondary | ICD-10-CM | POA: Diagnosis not present

## 2023-08-03 DIAGNOSIS — Z515 Encounter for palliative care: Secondary | ICD-10-CM | POA: Diagnosis not present

## 2023-08-03 DIAGNOSIS — Z6830 Body mass index (BMI) 30.0-30.9, adult: Secondary | ICD-10-CM | POA: Diagnosis not present

## 2023-08-03 DIAGNOSIS — R2681 Unsteadiness on feet: Secondary | ICD-10-CM | POA: Diagnosis not present

## 2023-08-03 DIAGNOSIS — M6281 Muscle weakness (generalized): Secondary | ICD-10-CM | POA: Diagnosis not present

## 2023-08-03 DIAGNOSIS — J4540 Moderate persistent asthma, uncomplicated: Secondary | ICD-10-CM | POA: Diagnosis not present

## 2023-08-03 DIAGNOSIS — Z1152 Encounter for screening for COVID-19: Secondary | ICD-10-CM | POA: Diagnosis not present

## 2023-08-03 DIAGNOSIS — R41841 Cognitive communication deficit: Secondary | ICD-10-CM | POA: Diagnosis not present

## 2023-08-03 DIAGNOSIS — K5100 Ulcerative (chronic) pancolitis without complications: Secondary | ICD-10-CM | POA: Diagnosis not present

## 2023-08-03 DIAGNOSIS — R339 Retention of urine, unspecified: Secondary | ICD-10-CM | POA: Diagnosis not present

## 2023-08-03 DIAGNOSIS — I10 Essential (primary) hypertension: Secondary | ICD-10-CM | POA: Diagnosis not present

## 2023-08-03 DIAGNOSIS — I4891 Unspecified atrial fibrillation: Secondary | ICD-10-CM | POA: Diagnosis not present

## 2023-08-03 DIAGNOSIS — M170 Bilateral primary osteoarthritis of knee: Secondary | ICD-10-CM | POA: Diagnosis not present

## 2023-08-03 DIAGNOSIS — J45909 Unspecified asthma, uncomplicated: Secondary | ICD-10-CM | POA: Diagnosis not present

## 2023-08-03 DIAGNOSIS — R4182 Altered mental status, unspecified: Secondary | ICD-10-CM | POA: Diagnosis not present

## 2023-08-03 DIAGNOSIS — E871 Hypo-osmolality and hyponatremia: Secondary | ICD-10-CM | POA: Diagnosis not present

## 2023-08-03 DIAGNOSIS — Z66 Do not resuscitate: Secondary | ICD-10-CM | POA: Diagnosis not present

## 2023-08-03 DIAGNOSIS — D849 Immunodeficiency, unspecified: Secondary | ICD-10-CM | POA: Diagnosis not present

## 2023-08-03 DIAGNOSIS — A4151 Sepsis due to Escherichia coli [E. coli]: Secondary | ICD-10-CM | POA: Diagnosis not present

## 2023-08-03 DIAGNOSIS — Z8619 Personal history of other infectious and parasitic diseases: Secondary | ICD-10-CM | POA: Diagnosis not present

## 2023-08-03 DIAGNOSIS — C678 Malignant neoplasm of overlapping sites of bladder: Secondary | ICD-10-CM | POA: Diagnosis not present

## 2023-08-03 DIAGNOSIS — K529 Noninfective gastroenteritis and colitis, unspecified: Secondary | ICD-10-CM | POA: Diagnosis not present

## 2023-08-03 DIAGNOSIS — R54 Age-related physical debility: Secondary | ICD-10-CM | POA: Diagnosis not present

## 2023-08-03 DIAGNOSIS — E876 Hypokalemia: Secondary | ICD-10-CM | POA: Diagnosis not present

## 2023-08-03 DIAGNOSIS — R1310 Dysphagia, unspecified: Secondary | ICD-10-CM | POA: Diagnosis not present

## 2023-08-03 DIAGNOSIS — R0602 Shortness of breath: Secondary | ICD-10-CM | POA: Diagnosis not present

## 2023-08-03 DIAGNOSIS — I2510 Atherosclerotic heart disease of native coronary artery without angina pectoris: Secondary | ICD-10-CM | POA: Diagnosis not present

## 2023-08-03 DIAGNOSIS — Z87440 Personal history of urinary (tract) infections: Secondary | ICD-10-CM | POA: Diagnosis not present

## 2023-08-03 DIAGNOSIS — K5190 Ulcerative colitis, unspecified, without complications: Secondary | ICD-10-CM | POA: Diagnosis not present

## 2023-08-03 DIAGNOSIS — M6250 Muscle wasting and atrophy, not elsewhere classified, unspecified site: Secondary | ICD-10-CM | POA: Diagnosis not present

## 2023-08-03 DIAGNOSIS — R6521 Severe sepsis with septic shock: Secondary | ICD-10-CM | POA: Diagnosis not present

## 2023-08-03 DIAGNOSIS — R627 Adult failure to thrive: Secondary | ICD-10-CM | POA: Diagnosis not present

## 2023-08-03 DIAGNOSIS — K51919 Ulcerative colitis, unspecified with unspecified complications: Secondary | ICD-10-CM | POA: Diagnosis not present

## 2023-08-03 DIAGNOSIS — G9340 Encephalopathy, unspecified: Secondary | ICD-10-CM | POA: Diagnosis not present

## 2023-08-03 DIAGNOSIS — T83511D Infection and inflammatory reaction due to indwelling urethral catheter, subsequent encounter: Secondary | ICD-10-CM | POA: Diagnosis not present

## 2023-08-03 DIAGNOSIS — F341 Dysthymic disorder: Secondary | ICD-10-CM | POA: Diagnosis not present

## 2023-08-03 DIAGNOSIS — R262 Difficulty in walking, not elsewhere classified: Secondary | ICD-10-CM | POA: Diagnosis not present

## 2023-08-03 DIAGNOSIS — T83511A Infection and inflammatory reaction due to indwelling urethral catheter, initial encounter: Secondary | ICD-10-CM | POA: Diagnosis not present

## 2023-08-03 DIAGNOSIS — E785 Hyperlipidemia, unspecified: Secondary | ICD-10-CM | POA: Diagnosis not present

## 2023-08-03 DIAGNOSIS — Z7189 Other specified counseling: Secondary | ICD-10-CM | POA: Diagnosis not present

## 2023-08-03 DIAGNOSIS — R6520 Severe sepsis without septic shock: Secondary | ICD-10-CM | POA: Diagnosis not present

## 2023-08-03 DIAGNOSIS — M4692 Unspecified inflammatory spondylopathy, cervical region: Secondary | ICD-10-CM | POA: Diagnosis not present

## 2023-08-03 DIAGNOSIS — R4189 Other symptoms and signs involving cognitive functions and awareness: Secondary | ICD-10-CM | POA: Diagnosis not present

## 2023-08-03 DIAGNOSIS — K219 Gastro-esophageal reflux disease without esophagitis: Secondary | ICD-10-CM | POA: Diagnosis not present

## 2023-08-03 DIAGNOSIS — E669 Obesity, unspecified: Secondary | ICD-10-CM | POA: Diagnosis not present

## 2023-08-03 DIAGNOSIS — Z8639 Personal history of other endocrine, nutritional and metabolic disease: Secondary | ICD-10-CM | POA: Diagnosis not present

## 2023-08-03 DIAGNOSIS — N139 Obstructive and reflux uropathy, unspecified: Secondary | ICD-10-CM | POA: Diagnosis not present

## 2023-08-03 DIAGNOSIS — R9389 Abnormal findings on diagnostic imaging of other specified body structures: Secondary | ICD-10-CM | POA: Diagnosis not present

## 2023-08-03 DIAGNOSIS — R0902 Hypoxemia: Secondary | ICD-10-CM | POA: Diagnosis not present

## 2023-08-03 DIAGNOSIS — R7402 Elevation of levels of lactic acid dehydrogenase (LDH): Secondary | ICD-10-CM | POA: Diagnosis not present

## 2023-08-03 DIAGNOSIS — Z872 Personal history of diseases of the skin and subcutaneous tissue: Secondary | ICD-10-CM | POA: Diagnosis not present

## 2023-08-03 DIAGNOSIS — R319 Hematuria, unspecified: Secondary | ICD-10-CM | POA: Diagnosis not present

## 2023-08-03 DIAGNOSIS — I480 Paroxysmal atrial fibrillation: Secondary | ICD-10-CM | POA: Diagnosis not present

## 2023-08-03 DIAGNOSIS — H409 Unspecified glaucoma: Secondary | ICD-10-CM | POA: Diagnosis not present

## 2023-08-03 DIAGNOSIS — R001 Bradycardia, unspecified: Secondary | ICD-10-CM | POA: Diagnosis not present

## 2023-08-03 MED ORDER — ENSURE ENLIVE PO LIQD: 237.0000 mL | ORAL | Status: DC

## 2023-08-03 MED ORDER — DILTIAZEM HCL ER COATED BEADS 120 MG PO CP24: 120.0000 mg | ORAL_CAPSULE | Freq: Every day | ORAL | Status: DC

## 2023-08-03 MED ORDER — SACCHAROMYCES BOULARDII 250 MG PO CAPS: 250.0000 mg | ORAL_CAPSULE | Freq: Two times a day (BID) | ORAL | Status: DC

## 2023-08-03 MED ORDER — METOPROLOL TARTRATE 25 MG PO TABS: 12.5000 mg | ORAL_TABLET | Freq: Two times a day (BID) | ORAL | Status: DC

## 2023-08-03 NOTE — Plan of Care (Signed)
Problem: Education: Goal: Knowledge of General Education information will improve Description: Including pain rating scale, medication(s)/side effects and non-pharmacologic comfort measures Outcome: Adequate for Discharge   Problem: Health Behavior/Discharge Planning: Goal: Ability to manage health-related needs will improve Outcome: Adequate for Discharge   Problem: Clinical Measurements: Goal: Ability to maintain clinical measurements within normal limits will improve Outcome: Adequate for Discharge Goal: Will remain free from infection Outcome: Adequate for Discharge Goal: Diagnostic test results will improve Outcome: Adequate for Discharge Goal: Respiratory complications will improve Outcome: Adequate for Discharge Goal: Cardiovascular complication will be avoided Outcome: Adequate for Discharge   Problem: Activity: Goal: Risk for activity intolerance will decrease Outcome: Adequate for Discharge   Problem: Nutrition: Goal: Adequate nutrition will be maintained Outcome: Adequate for Discharge   Problem: Coping: Goal: Level of anxiety will decrease Outcome: Adequate for Discharge   Problem: Elimination: Goal: Will not experience complications related to bowel motility Outcome: Adequate for Discharge Goal: Will not experience complications related to urinary retention Outcome: Adequate for Discharge   Problem: Pain Managment: Goal: General experience of comfort will improve Outcome: Adequate for Discharge   Problem: Safety: Goal: Ability to remain free from injury will improve Outcome: Adequate for Discharge   Problem: Skin Integrity: Goal: Risk for impaired skin integrity will decrease Outcome: Adequate for Discharge   Problem: Education: Goal: Knowledge of General Education information will improve Description: Including pain rating scale, medication(s)/side effects and non-pharmacologic comfort measures Outcome: Adequate for Discharge   Problem: Health  Behavior/Discharge Planning: Goal: Ability to manage health-related needs will improve Outcome: Adequate for Discharge   Problem: Clinical Measurements: Goal: Ability to maintain clinical measurements within normal limits will improve Outcome: Adequate for Discharge Goal: Will remain free from infection Outcome: Adequate for Discharge Goal: Diagnostic test results will improve Outcome: Adequate for Discharge Goal: Respiratory complications will improve Outcome: Adequate for Discharge Goal: Cardiovascular complication will be avoided Outcome: Adequate for Discharge   Problem: Activity: Goal: Risk for activity intolerance will decrease Outcome: Adequate for Discharge   Problem: Nutrition: Goal: Adequate nutrition will be maintained Outcome: Adequate for Discharge   Problem: Coping: Goal: Level of anxiety will decrease Outcome: Adequate for Discharge   Problem: Elimination: Goal: Will not experience complications related to bowel motility Outcome: Adequate for Discharge Goal: Will not experience complications related to urinary retention Outcome: Adequate for Discharge   Problem: Pain Managment: Goal: General experience of comfort will improve Outcome: Adequate for Discharge   Problem: Safety: Goal: Ability to remain free from injury will improve Outcome: Adequate for Discharge   Problem: Skin Integrity: Goal: Risk for impaired skin integrity will decrease Outcome: Adequate for Discharge   Problem: Education: Goal: Knowledge of General Education information will improve Description: Including pain rating scale, medication(s)/side effects and non-pharmacologic comfort measures Outcome: Adequate for Discharge   Problem: Health Behavior/Discharge Planning: Goal: Ability to manage health-related needs will improve Outcome: Adequate for Discharge   Problem: Clinical Measurements: Goal: Ability to maintain clinical measurements within normal limits will  improve Outcome: Adequate for Discharge Goal: Will remain free from infection Outcome: Adequate for Discharge Goal: Diagnostic test results will improve Outcome: Adequate for Discharge Goal: Respiratory complications will improve Outcome: Adequate for Discharge Goal: Cardiovascular complication will be avoided Outcome: Adequate for Discharge   Problem: Activity: Goal: Risk for activity intolerance will decrease Outcome: Adequate for Discharge   Problem: Nutrition: Goal: Adequate nutrition will be maintained Outcome: Adequate for Discharge   Problem: Coping: Goal: Level of anxiety will decrease Outcome: Adequate for  Discharge   Problem: Elimination: Goal: Will not experience complications related to bowel motility Outcome: Adequate for Discharge Goal: Will not experience complications related to urinary retention Outcome: Adequate for Discharge   Problem: Pain Managment: Goal: General experience of comfort will improve Outcome: Adequate for Discharge   Problem: Safety: Goal: Ability to remain free from injury will improve Outcome: Adequate for Discharge   Problem: Skin Integrity: Goal: Risk for impaired skin integrity will decrease Outcome: Adequate for Discharge

## 2023-08-03 NOTE — Progress Notes (Signed)
Hospital RN attempted another handoff phone call to patient's receiving facility. Phone rang multiple times with no answer.

## 2023-08-03 NOTE — Hospital Course (Addendum)
77 year old with obesity, A-fib, coronary artery disease and CABG, ulcerative colitis, chronic anemia, bladder cancer who recently underwent resection of large bladder tumor in July and was scheduled for second stage of surgery on 8/6.  Patient has been declining since last surgery.  8/4, presented to the ER with altered mental status and progressively worsening lethargy.  In the emergency room tachycardic in A-fib.  Hemoglobin baseline.  Urine analysis with infection.8/6, underwent repeat partial resection of bladder tumor, catheter placement.  Patient remained in very poor health with poor clinical recovery.  Patient was seen by palliative care Decision was made not to undergo further surgeries.  Patient is being discharged to [facility] 8/17 for short-term rehab with ultimate plan to keep him as a long-term care and palliative care to follow-up at [facility].  Patient will be followed by [facility] palliative services at [facility] after discharge .

## 2023-08-03 NOTE — Discharge Summary (Signed)
Physician Discharge Summary  Barry [name] FTD:[id number] DOB: May 20, 1946 DOA: 07/21/2023  PCP: Georgann Housekeeper, MD  Admit date: 07/21/2023 Discharge date: 08/03/2023 Recommendations for Outpatient Follow-up:  Follow up with PCP in 1 weeks-call for appointment Please obtain BMP/CBC in one week Follow-up with palliative services at LTC  Discharge Dispo: SNF Discharge Condition: Stable Code Status:   Code Status: DNR Diet recommendation:  Diet Order             Diet regular Room service appropriate? Yes; Fluid consistency: Thin  Diet effective now                    Brief/Interim Summary: 77 year old with obesity, A-fib, coronary artery disease and CABG, ulcerative colitis, chronic anemia, bladder cancer who recently underwent resection of large bladder tumor in July and was scheduled for second stage of surgery on 8/6.  Patient has been declining since last surgery.  8/4, presented to the ER with altered mental status and progressively worsening lethargy.  In the emergency room tachycardic in A-fib.  Hemoglobin baseline.  Urine analysis with infection.8/6, underwent repeat partial resection of bladder tumor, catheter placement.  Patient remained in very poor health with poor clinical recovery.  Patient was seen by palliative care Decision was made not to undergo further surgeries.  Patient is being discharged to [facility] 8/17 for short-term rehab with ultimate plan to keep him as a long-term care and palliative care to follow-up at [facility].  Patient will be followed by [facility] palliative services at LTC after discharge .   Discharge Diagnoses:  Principal Problem:   UTI (urinary tract infection) Active Problems:   Palliative care encounter   Lower urinary tract infectious disease  UTI associated with Foley catheter: Catheter exchanged.  Blood cultures negative.  Urine cultures with E. coli.  Treated with Rocephin.  Completed 10 days of antibiotics.  Will keep Foley catheter  for comfort and urinary retention.   Acute blood loss anemia: Received blood transfusions.  Continue iron supplementation.  Hemoglobin is stable.  Hyponatremia Hypokalemia Metabolic acidosis: Labs fairly stable  Progressive urothelial carcinoma of the bladder  Chronic diarrhea and ulcerative colitis Failure to thrive Recurrent metabolic encephalopathy End of life care/Goals of care: Patient is not a candidate for aggressive cancer therapies. Remains frail debilitated.  Discussed in detail with case management, patient's healthcare power of attorney.Patient has been approved for short-term rehab at [facility].  Patient's healthcare power of attorney needs today to complete paperwork and finances so that patient can be transferred tomorrow to [facility]. -Discharge to [facility] 8/17 short-term rehab.  Consult palliative care at the nursing home.  Pressure Ulcer: Pressure Injury 06/03/23 Buttocks Bilateral Stage 2 -  Partial thickness loss of dermis presenting as a shallow open injury with a red, pink wound bed without slough. (Active)  06/03/23 1740  Location: Buttocks  Location Orientation: Bilateral  Staging: Stage 2 -  Partial thickness loss of dermis presenting as a shallow open injury with a red, pink wound bed without slough. (MASD around wound)  Wound Description (Comments):   Present on Admission: Yes  Dressing Type Foam - Lift dressing to assess site every shift 08/02/23 1945    Consults: Urology, palliative care Subjective: Alert awake on room air.   Resting comfortably no complaints Overnight afebrile BP stable  Discharge Exam: Vitals:   08/03/23 0612 08/03/23 0820  BP: (!) 123/59   Pulse: (!) 110   Resp: 20   Temp: 98.4 F (36.9  C)   SpO2: 94% 93%   General: Pt is alert, awake, not in acute distress Cardiovascular: RRR, S1/S2 +, no rubs, no gallops Respiratory: CTA bilaterally, no wheezing, no rhonchi Abdominal: Soft, NT, ND, bowel sounds  + Extremities: no edema, no cyanosis  Discharge Instructions  Discharge Instructions     Discharge instructions   Complete by: As directed    Please call call MD or return to ER for similar or worsening recurring problem that brought you to hospital or if any fever,nausea/vomiting,abdominal pain, uncontrolled pain, chest pain,  shortness of breath or any other alarming symptoms.  Please follow-up your doctor as instructed in a week time and call the office for appointment.  Please avoid alcohol, smoking, or any other illicit substance and maintain healthy habits including taking your regular medications as prescribed.  You were cared for by a hospitalist during your hospital stay. If you have any questions about your discharge medications or the care you received while you were in the hospital after you are discharged, you can call the unit and ask to speak with the hospitalist on call if the hospitalist that took care of you is not available.  Once you are discharged, your primary care physician will handle any further medical issues. Please note that NO REFILLS for any discharge medications will be authorized once you are discharged, as it is imperative that you return to your primary care physician (or establish a relationship with a primary care physician if you do not have one) for your aftercare needs so that they can reassess your need for medications and monitor your lab values   Increase activity slowly   Complete by: As directed    No wound care   Complete by: As directed       Allergies as of 08/03/2023       Reactions   Piroxicam Itching, Rash        Medication List     STOP taking these medications    losartan 25 MG tablet Commonly known as: COZAAR   QUEtiapine 25 MG tablet Commonly known as: SEROQUEL   traMADol HCl 25 MG Tabs       TAKE these medications    acetaminophen 325 MG tablet Commonly known as: TYLENOL Take 2 tablets (650 mg total) by mouth  every 4 (four) hours as needed for mild pain (or Fever >/= 101).   albuterol 108 (90 Base) MCG/ACT inhaler Commonly known as: VENTOLIN HFA Inhale 2 puffs into the lungs every 4 (four) hours as needed for wheezing or shortness of breath.   atorvastatin 40 MG tablet Commonly known as: LIPITOR Take 40 mg by mouth daily.   bisacodyl 10 MG suppository Commonly known as: DULCOLAX Place 10 mg rectally daily as needed (Constipation not relieved by milk of magnesium).   budesonide-formoterol 160-4.5 MCG/ACT inhaler Commonly known as: SYMBICORT Inhale 2 puffs into the lungs 2 (two) times daily.   carboxymethylcellulose 0.5 % Soln Commonly known as: REFRESH PLUS Place 2 drops into both eyes every 8 (eight) hours as needed (dry eyes).   CertaVite Senior Tabs Take 1 tablet by mouth daily.   cholestyramine 4 g packet Commonly known as: Questran Take 1 packet by mouth 2 (two) times daily.   cycloSPORINE 0.05 % ophthalmic emulsion Commonly known as: RESTASIS Place 1 drop into both eyes 2 (two) times daily.   diltiazem 120 MG 24 hr capsule Commonly known as: CARDIZEM CD Take 1 capsule (120 mg total) by mouth daily. Start  taking on: August 04, 2023 What changed:  medication strength how much to take   escitalopram 20 MG tablet Commonly known as: LEXAPRO Take 1 tablet (20 mg total) by mouth daily.   ezetimibe 10 MG tablet Commonly known as: ZETIA Take 10 mg by mouth daily.   feeding supplement Liqd Take 237 mLs by mouth daily. Start taking on: August 04, 2023   latanoprost 0.005 % ophthalmic solution Commonly known as: XALATAN Place 1 drop into both eyes at bedtime.   loperamide 2 MG capsule Commonly known as: IMODIUM Take 4 mg by mouth in the morning and at bedtime.   magnesium hydroxide 400 MG/5ML suspension Commonly known as: MILK OF MAGNESIA Take 30 mLs by mouth daily as needed for mild constipation.   metoprolol tartrate 25 MG tablet Commonly known as:  LOPRESSOR Take 0.5 tablets (12.5 mg total) by mouth 2 (two) times daily. What changed:  medication strength how much to take   montelukast 10 MG tablet Commonly known as: SINGULAIR Take 10 mg by mouth at bedtime.   nitroGLYCERIN 0.4 MG SL tablet Commonly known as: NITROSTAT Place 1 tablet (0.4 mg total) under the tongue every 5 (five) minutes as needed for chest pain.   OCUSOFT LID SCRUB EX Apply 1 Application topically as needed (reason not listed on MAR).   pantoprazole 40 MG tablet Commonly known as: PROTONIX Take 1 tablet (40 mg total) by mouth daily.   RA SALINE ENEMA RE Place 1 enema rectally daily as needed (Constipation unrelieved by milk of mag or bisacodyl suppository).   saccharomyces boulardii 250 MG capsule Commonly known as: FLORASTOR Take 1 capsule (250 mg total) by mouth 2 (two) times daily.   Slow Fe 142 (45 Fe) MG Tbcr Generic drug: Ferrous Sulfate Take 1 tablet by mouth daily.   sulfaSALAzine 500 MG tablet Commonly known as: AZULFIDINE Take 1,000 mg by mouth 4 (four) times daily.   Vitamin D 50 MCG (2000 UT) tablet Take 2,000 Units by mouth daily.        Follow-up Information     Georgann Housekeeper, MD Follow up in 1 week(s).   Specialty: Internal Medicine Contact information: [address] [place] [address] [phone]                Allergies  Allergen Reactions   Piroxicam Itching and Rash    The results of significant diagnostics from this hospitalization (including imaging, microbiology, ancillary and laboratory) are listed below for reference.    Microbiology: No results found for this or any previous visit (from the past 240 hour(s)).  Procedures/Studies: CT Head Wo Contrast  Result Date: 07/21/2023 CLINICAL DATA:  Mental status change, unknown cause EXAM: CT HEAD WITHOUT CONTRAST TECHNIQUE: Contiguous axial images were obtained from the base of the skull through the vertex without intravenous contrast.  RADIATION DOSE REDUCTION: This exam was performed according to the departmental dose-optimization program which includes automated exposure control, adjustment of the mA and/or kV according to patient size and/or use of iterative reconstruction technique. COMPARISON:  None Available. FINDINGS: Brain: No evidence of acute infarction, hemorrhage, hydrocephalus, extra-axial collection or mass lesion/mass effect. Sequela of mild chronic microvascular ischemic change. Generalized volume loss. Vascular: No hyperdense vessel or unexpected calcification. Skull: Normal. Negative for fracture or focal lesion. Sinuses/Orbits: No middle ear or mastoid effusion. Paranasal sinuses are clear. Bilateral lens replacement. Orbits are otherwise unremarkable. Other: None. IMPRESSION: No acute intracranial abnormality. Electronically Signed   By: Elige Radon.D.  On: 07/21/2023 13:31   DG Chest Port 1 View  Result Date: 07/21/2023 CLINICAL DATA:  Altered mental status, questionable sepsis. EXAM: PORTABLE CHEST 1 VIEW COMPARISON:  Chest radiograph dated 06/07/2021. FINDINGS: The heart size and mediastinal contours are within normal limits. The lung volumes are low. There is mild left basilar atelectasis/airspace disease. The right lung is clear. No large pleural effusion or pneumothorax is identified. The visualized skeletal structures are unremarkable. IMPRESSION: Low lung volumes with mild left basilar atelectasis/airspace disease. Electronically Signed   By: Romona Curls M.D.   On: 07/21/2023 13:15    Labs: BNP (last 3 results) No results for input(s): "BNP" in the last 8760 hours. Basic Metabolic Panel: Recent Labs  Lab 07/28/23 0407  NA 133*  K 4.1  CL 102  CO2 24  GLUCOSE 91  BUN 13  CREATININE 0.58*  CALCIUM 8.1*   Liver Function Tests: No results for input(s): "AST", "ALT", "ALKPHOS", "BILITOT", "PROT", "ALBUMIN" in the last 168 hours. No results for input(s): "LIPASE", "AMYLASE" in the last 168  hours. No results for input(s): "AMMONIA" in the last 168 hours. CBC: Recent Labs  Lab 07/27/23 2113 07/28/23 0407  WBC 4.9 4.6  NEUTROABS 3.1 2.8  HGB 8.3* 8.4*  HCT 26.8* 28.0*  MCV 92.4 93.3  PLT 197 208   Cardiac Enzymes: No results for input(s): "CKTOTAL", "CKMB", "CKMBINDEX", "TROPONINI" in the last 168 hours. BNP: Invalid input(s): "POCBNP" CBG: Recent Labs  Lab 07/28/23 2015  GLUCAP 101*   No results for input(s): "VITAMINB12", "FOLATE", "FERRITIN", "TIBC", "IRON", "RETICCTPCT" in the last 72 hours. Urinalysis    Component Value Date/Time   COLORURINE AMBER (A) 07/21/2023 1235   APPEARANCEUR TURBID (A) 07/21/2023 1235   LABSPEC 1.017 07/21/2023 1235   PHURINE 5.0 07/21/2023 1235   GLUCOSEU NEGATIVE 07/21/2023 1235   HGBUR MODERATE (A) 07/21/2023 1235   BILIRUBINUR NEGATIVE 07/21/2023 1235   KETONESUR NEGATIVE 07/21/2023 1235   PROTEINUR 100 (A) 07/21/2023 1235   UROBILINOGEN 0.2 11/12/2013 0047   NITRITE NEGATIVE 07/21/2023 1235   LEUKOCYTESUR LARGE (A) 07/21/2023 1235   Sepsis Labs Recent Labs  Lab 07/27/23 2113 07/28/23 0407  WBC 4.9 4.6   Microbiology No results found for this or any previous visit (from the past 240 hour(s)).  Time coordinating discharge: 35 minutes  SIGNED: Lanae Boast, MD  [facility] 08/03/2023, 11:42 AM  If 7PM-7AM, please contact night-coverage [email or web address]

## 2023-08-03 NOTE — TOC Transition Note (Addendum)
Transition of Care [facility]) - CM/SW Discharge Note   Patient Details  Name: BLUE ISENHART MRN: [id number] Date of Birth: 11-Sep-1946  [facility]) CM/SW Contact:  Georgie Chard, LCSW Phone Number: 08/03/2023, 1:18 PM   Clinical Narrative:    This CSW has confirmed that the patient will return to [facility]. Room number 501 A report Number [phone]. This CSW has completed the patient's packet with DNR included. This CSW has given the patient's packet to the nurse. AT this time PTAR was called. Current RN will reach out to [facility] and call report. AT this time there are no further TOC needs.      Barriers to Discharge: Continued Medical Work up   Patient Goals and CMS Choice CMS Medicare.gov Compare Post Acute Care list provided to:: Patient Represenative (must comment) Verner Mould (friend)) Choice offered to / list presented to :  Verner Mould (friend))  Discharge Placement                         Discharge Plan and Services Additional resources added to the After Visit Summary for                                       Social Determinants of Health (SDOH) Interventions SDOH Screenings   Food Insecurity: No Food Insecurity (07/21/2023)  Housing: Low Risk  (07/21/2023)  Transportation Needs: No Transportation Needs (07/21/2023)  Utilities: Not At Risk (07/21/2023)  Tobacco Use: Low Risk  (07/23/2023)     Readmission Risk Interventions    07/22/2023    1:05 PM  Readmission Risk Prevention Plan  Transportation Screening Complete  PCP or Specialist Appt within 5-7 Days Complete  Home Care Screening Complete  Medication Review (RN CM) Complete

## 2023-08-03 NOTE — Progress Notes (Signed)
RN attempted to call report to receiving facility for patient. Receptionist at facility transferred hospital RN to receiving facility RN number, but phone rang multiple times with no answer. Hospital RN will attempt calling report later.

## 2023-08-05 DIAGNOSIS — Z8639 Personal history of other endocrine, nutritional and metabolic disease: Secondary | ICD-10-CM | POA: Diagnosis not present

## 2023-08-05 DIAGNOSIS — L89302 Pressure ulcer of unspecified buttock, stage 2: Secondary | ICD-10-CM | POA: Diagnosis not present

## 2023-08-05 DIAGNOSIS — R627 Adult failure to thrive: Secondary | ICD-10-CM | POA: Diagnosis not present

## 2023-08-05 DIAGNOSIS — D62 Acute posthemorrhagic anemia: Secondary | ICD-10-CM | POA: Diagnosis not present

## 2023-08-05 DIAGNOSIS — J4540 Moderate persistent asthma, uncomplicated: Secondary | ICD-10-CM | POA: Diagnosis not present

## 2023-08-05 DIAGNOSIS — R2681 Unsteadiness on feet: Secondary | ICD-10-CM | POA: Diagnosis not present

## 2023-08-05 DIAGNOSIS — E785 Hyperlipidemia, unspecified: Secondary | ICD-10-CM | POA: Diagnosis not present

## 2023-08-05 DIAGNOSIS — M170 Bilateral primary osteoarthritis of knee: Secondary | ICD-10-CM | POA: Diagnosis not present

## 2023-08-05 DIAGNOSIS — Z951 Presence of aortocoronary bypass graft: Secondary | ICD-10-CM | POA: Diagnosis not present

## 2023-08-05 DIAGNOSIS — I480 Paroxysmal atrial fibrillation: Secondary | ICD-10-CM | POA: Diagnosis not present

## 2023-08-05 DIAGNOSIS — Z87440 Personal history of urinary (tract) infections: Secondary | ICD-10-CM | POA: Diagnosis not present

## 2023-08-05 DIAGNOSIS — Z8619 Personal history of other infectious and parasitic diseases: Secondary | ICD-10-CM | POA: Diagnosis not present

## 2023-08-05 DIAGNOSIS — I2510 Atherosclerotic heart disease of native coronary artery without angina pectoris: Secondary | ICD-10-CM | POA: Diagnosis not present

## 2023-08-05 DIAGNOSIS — E871 Hypo-osmolality and hyponatremia: Secondary | ICD-10-CM | POA: Diagnosis not present

## 2023-08-05 DIAGNOSIS — K219 Gastro-esophageal reflux disease without esophagitis: Secondary | ICD-10-CM | POA: Diagnosis not present

## 2023-08-05 DIAGNOSIS — M6281 Muscle weakness (generalized): Secondary | ICD-10-CM | POA: Diagnosis not present

## 2023-08-05 DIAGNOSIS — T83511D Infection and inflammatory reaction due to indwelling urethral catheter, subsequent encounter: Secondary | ICD-10-CM | POA: Diagnosis not present

## 2023-08-05 DIAGNOSIS — G9341 Metabolic encephalopathy: Secondary | ICD-10-CM | POA: Diagnosis not present

## 2023-08-05 DIAGNOSIS — F341 Dysthymic disorder: Secondary | ICD-10-CM | POA: Diagnosis not present

## 2023-08-05 DIAGNOSIS — K51919 Ulcerative colitis, unspecified with unspecified complications: Secondary | ICD-10-CM | POA: Diagnosis not present

## 2023-08-05 DIAGNOSIS — E876 Hypokalemia: Secondary | ICD-10-CM | POA: Diagnosis not present

## 2023-08-05 DIAGNOSIS — M6250 Muscle wasting and atrophy, not elsewhere classified, unspecified site: Secondary | ICD-10-CM | POA: Diagnosis not present

## 2023-08-05 DIAGNOSIS — I10 Essential (primary) hypertension: Secondary | ICD-10-CM | POA: Diagnosis not present

## 2023-08-05 DIAGNOSIS — C679 Malignant neoplasm of bladder, unspecified: Secondary | ICD-10-CM | POA: Diagnosis not present

## 2023-08-05 DIAGNOSIS — Z515 Encounter for palliative care: Secondary | ICD-10-CM | POA: Diagnosis not present

## 2023-08-05 DIAGNOSIS — H409 Unspecified glaucoma: Secondary | ICD-10-CM | POA: Diagnosis not present

## 2023-08-05 DIAGNOSIS — K529 Noninfective gastroenteritis and colitis, unspecified: Secondary | ICD-10-CM | POA: Diagnosis not present

## 2023-08-07 DIAGNOSIS — M170 Bilateral primary osteoarthritis of knee: Secondary | ICD-10-CM | POA: Diagnosis not present

## 2023-08-07 DIAGNOSIS — R627 Adult failure to thrive: Secondary | ICD-10-CM | POA: Diagnosis not present

## 2023-08-07 DIAGNOSIS — G9341 Metabolic encephalopathy: Secondary | ICD-10-CM | POA: Diagnosis not present

## 2023-08-07 DIAGNOSIS — R2681 Unsteadiness on feet: Secondary | ICD-10-CM | POA: Diagnosis not present

## 2023-08-07 DIAGNOSIS — M6250 Muscle wasting and atrophy, not elsewhere classified, unspecified site: Secondary | ICD-10-CM | POA: Diagnosis not present

## 2023-08-07 DIAGNOSIS — M6281 Muscle weakness (generalized): Secondary | ICD-10-CM | POA: Diagnosis not present

## 2023-08-08 DIAGNOSIS — I4891 Unspecified atrial fibrillation: Secondary | ICD-10-CM | POA: Diagnosis not present

## 2023-08-08 DIAGNOSIS — I480 Paroxysmal atrial fibrillation: Secondary | ICD-10-CM | POA: Diagnosis not present

## 2023-08-08 DIAGNOSIS — J4540 Moderate persistent asthma, uncomplicated: Secondary | ICD-10-CM | POA: Diagnosis not present

## 2023-08-08 DIAGNOSIS — Z960 Presence of urogenital implants: Secondary | ICD-10-CM | POA: Diagnosis not present

## 2023-08-08 DIAGNOSIS — R339 Retention of urine, unspecified: Secondary | ICD-10-CM | POA: Diagnosis not present

## 2023-08-08 DIAGNOSIS — E871 Hypo-osmolality and hyponatremia: Secondary | ICD-10-CM | POA: Diagnosis not present

## 2023-08-08 DIAGNOSIS — F32A Depression, unspecified: Secondary | ICD-10-CM | POA: Diagnosis not present

## 2023-08-08 DIAGNOSIS — Z872 Personal history of diseases of the skin and subcutaneous tissue: Secondary | ICD-10-CM | POA: Diagnosis not present

## 2023-08-08 DIAGNOSIS — R319 Hematuria, unspecified: Secondary | ICD-10-CM | POA: Diagnosis not present

## 2023-08-08 DIAGNOSIS — K51919 Ulcerative colitis, unspecified with unspecified complications: Secondary | ICD-10-CM | POA: Diagnosis not present

## 2023-08-08 DIAGNOSIS — D62 Acute posthemorrhagic anemia: Secondary | ICD-10-CM | POA: Diagnosis not present

## 2023-08-08 DIAGNOSIS — E876 Hypokalemia: Secondary | ICD-10-CM | POA: Diagnosis not present

## 2023-08-08 DIAGNOSIS — Z87440 Personal history of urinary (tract) infections: Secondary | ICD-10-CM | POA: Diagnosis not present

## 2023-08-08 DIAGNOSIS — K5100 Ulcerative (chronic) pancolitis without complications: Secondary | ICD-10-CM | POA: Diagnosis not present

## 2023-08-08 DIAGNOSIS — H1131 Conjunctival hemorrhage, right eye: Secondary | ICD-10-CM | POA: Diagnosis not present

## 2023-08-08 DIAGNOSIS — C679 Malignant neoplasm of bladder, unspecified: Secondary | ICD-10-CM | POA: Diagnosis not present

## 2023-08-08 DIAGNOSIS — M25569 Pain in unspecified knee: Secondary | ICD-10-CM | POA: Diagnosis not present

## 2023-08-08 DIAGNOSIS — Z515 Encounter for palliative care: Secondary | ICD-10-CM | POA: Diagnosis not present

## 2023-08-08 DIAGNOSIS — Z7189 Other specified counseling: Secondary | ICD-10-CM | POA: Diagnosis not present

## 2023-08-08 DIAGNOSIS — I10 Essential (primary) hypertension: Secondary | ICD-10-CM | POA: Diagnosis not present

## 2023-08-12 DIAGNOSIS — M6250 Muscle wasting and atrophy, not elsewhere classified, unspecified site: Secondary | ICD-10-CM | POA: Diagnosis not present

## 2023-08-12 DIAGNOSIS — R2681 Unsteadiness on feet: Secondary | ICD-10-CM | POA: Diagnosis not present

## 2023-08-12 DIAGNOSIS — R627 Adult failure to thrive: Secondary | ICD-10-CM | POA: Diagnosis not present

## 2023-08-12 DIAGNOSIS — G9341 Metabolic encephalopathy: Secondary | ICD-10-CM | POA: Diagnosis not present

## 2023-08-12 DIAGNOSIS — M6281 Muscle weakness (generalized): Secondary | ICD-10-CM | POA: Diagnosis not present

## 2023-08-12 DIAGNOSIS — M170 Bilateral primary osteoarthritis of knee: Secondary | ICD-10-CM | POA: Diagnosis not present

## 2023-08-14 ENCOUNTER — Emergency Department (HOSPITAL_COMMUNITY): Payer: Medicare Other

## 2023-08-14 ENCOUNTER — Inpatient Hospital Stay (HOSPITAL_COMMUNITY)
Admission: EM | Admit: 2023-08-14 | Discharge: 2023-09-17 | DRG: 951 | Disposition: E | Payer: Medicare Other | Source: Skilled Nursing Facility | Attending: Internal Medicine | Admitting: Internal Medicine

## 2023-08-14 DIAGNOSIS — A419 Sepsis, unspecified organism: Secondary | ICD-10-CM | POA: Diagnosis not present

## 2023-08-14 DIAGNOSIS — G9340 Encephalopathy, unspecified: Secondary | ICD-10-CM | POA: Diagnosis not present

## 2023-08-14 DIAGNOSIS — G9341 Metabolic encephalopathy: Secondary | ICD-10-CM

## 2023-08-14 DIAGNOSIS — R9389 Abnormal findings on diagnostic imaging of other specified body structures: Secondary | ICD-10-CM | POA: Diagnosis not present

## 2023-08-14 DIAGNOSIS — E785 Hyperlipidemia, unspecified: Secondary | ICD-10-CM | POA: Diagnosis not present

## 2023-08-14 DIAGNOSIS — Y846 Urinary catheterization as the cause of abnormal reaction of the patient, or of later complication, without mention of misadventure at the time of the procedure: Secondary | ICD-10-CM | POA: Diagnosis present

## 2023-08-14 DIAGNOSIS — Z888 Allergy status to other drugs, medicaments and biological substances status: Secondary | ICD-10-CM

## 2023-08-14 DIAGNOSIS — J45909 Unspecified asthma, uncomplicated: Secondary | ICD-10-CM | POA: Diagnosis not present

## 2023-08-14 DIAGNOSIS — Z7951 Long term (current) use of inhaled steroids: Secondary | ICD-10-CM

## 2023-08-14 DIAGNOSIS — T83511A Infection and inflammatory reaction due to indwelling urethral catheter, initial encounter: Secondary | ICD-10-CM | POA: Diagnosis not present

## 2023-08-14 DIAGNOSIS — I4891 Unspecified atrial fibrillation: Secondary | ICD-10-CM | POA: Diagnosis not present

## 2023-08-14 DIAGNOSIS — Z8249 Family history of ischemic heart disease and other diseases of the circulatory system: Secondary | ICD-10-CM

## 2023-08-14 DIAGNOSIS — C679 Malignant neoplasm of bladder, unspecified: Secondary | ICD-10-CM | POA: Diagnosis not present

## 2023-08-14 DIAGNOSIS — Z7189 Other specified counseling: Secondary | ICD-10-CM | POA: Diagnosis not present

## 2023-08-14 DIAGNOSIS — R0602 Shortness of breath: Secondary | ICD-10-CM | POA: Diagnosis not present

## 2023-08-14 DIAGNOSIS — I480 Paroxysmal atrial fibrillation: Secondary | ICD-10-CM | POA: Diagnosis not present

## 2023-08-14 DIAGNOSIS — R6521 Severe sepsis with septic shock: Secondary | ICD-10-CM | POA: Diagnosis present

## 2023-08-14 DIAGNOSIS — I959 Hypotension, unspecified: Secondary | ICD-10-CM | POA: Diagnosis not present

## 2023-08-14 DIAGNOSIS — J80 Acute respiratory distress syndrome: Secondary | ICD-10-CM | POA: Diagnosis not present

## 2023-08-14 DIAGNOSIS — H409 Unspecified glaucoma: Secondary | ICD-10-CM | POA: Diagnosis not present

## 2023-08-14 DIAGNOSIS — K5190 Ulcerative colitis, unspecified, without complications: Secondary | ICD-10-CM | POA: Diagnosis not present

## 2023-08-14 DIAGNOSIS — Z66 Do not resuscitate: Secondary | ICD-10-CM | POA: Diagnosis not present

## 2023-08-14 DIAGNOSIS — K219 Gastro-esophageal reflux disease without esophagitis: Secondary | ICD-10-CM | POA: Diagnosis present

## 2023-08-14 DIAGNOSIS — Z515 Encounter for palliative care: Principal | ICD-10-CM

## 2023-08-14 DIAGNOSIS — A4151 Sepsis due to Escherichia coli [E. coli]: Secondary | ICD-10-CM | POA: Diagnosis not present

## 2023-08-14 DIAGNOSIS — N390 Urinary tract infection, site not specified: Secondary | ICD-10-CM

## 2023-08-14 DIAGNOSIS — R791 Abnormal coagulation profile: Secondary | ICD-10-CM | POA: Diagnosis present

## 2023-08-14 DIAGNOSIS — R6520 Severe sepsis without septic shock: Secondary | ICD-10-CM

## 2023-08-14 DIAGNOSIS — I2510 Atherosclerotic heart disease of native coronary artery without angina pectoris: Secondary | ICD-10-CM | POA: Diagnosis present

## 2023-08-14 DIAGNOSIS — R001 Bradycardia, unspecified: Secondary | ICD-10-CM | POA: Diagnosis not present

## 2023-08-14 DIAGNOSIS — I10 Essential (primary) hypertension: Secondary | ICD-10-CM | POA: Diagnosis present

## 2023-08-14 DIAGNOSIS — J4530 Mild persistent asthma, uncomplicated: Secondary | ICD-10-CM | POA: Diagnosis not present

## 2023-08-14 DIAGNOSIS — K51919 Ulcerative colitis, unspecified with unspecified complications: Secondary | ICD-10-CM

## 2023-08-14 DIAGNOSIS — Z87440 Personal history of urinary (tract) infections: Secondary | ICD-10-CM

## 2023-08-14 DIAGNOSIS — D62 Acute posthemorrhagic anemia: Secondary | ICD-10-CM | POA: Diagnosis not present

## 2023-08-14 DIAGNOSIS — C678 Malignant neoplasm of overlapping sites of bladder: Secondary | ICD-10-CM | POA: Diagnosis not present

## 2023-08-14 DIAGNOSIS — Z951 Presence of aortocoronary bypass graft: Secondary | ICD-10-CM

## 2023-08-14 DIAGNOSIS — R4189 Other symptoms and signs involving cognitive functions and awareness: Secondary | ICD-10-CM | POA: Diagnosis not present

## 2023-08-14 DIAGNOSIS — Z1152 Encounter for screening for COVID-19: Secondary | ICD-10-CM

## 2023-08-14 DIAGNOSIS — R0902 Hypoxemia: Secondary | ICD-10-CM | POA: Diagnosis not present

## 2023-08-14 DIAGNOSIS — Z841 Family history of disorders of kidney and ureter: Secondary | ICD-10-CM

## 2023-08-14 DIAGNOSIS — Z6830 Body mass index (BMI) 30.0-30.9, adult: Secondary | ICD-10-CM | POA: Diagnosis not present

## 2023-08-14 DIAGNOSIS — J4540 Moderate persistent asthma, uncomplicated: Secondary | ICD-10-CM | POA: Diagnosis not present

## 2023-08-14 DIAGNOSIS — Z79899 Other long term (current) drug therapy: Secondary | ICD-10-CM

## 2023-08-14 DIAGNOSIS — E876 Hypokalemia: Secondary | ICD-10-CM | POA: Diagnosis not present

## 2023-08-14 DIAGNOSIS — R54 Age-related physical debility: Secondary | ICD-10-CM | POA: Diagnosis not present

## 2023-08-14 DIAGNOSIS — R7402 Elevation of levels of lactic acid dehydrogenase (LDH): Secondary | ICD-10-CM | POA: Diagnosis present

## 2023-08-14 DIAGNOSIS — D630 Anemia in neoplastic disease: Secondary | ICD-10-CM | POA: Diagnosis not present

## 2023-08-14 DIAGNOSIS — E669 Obesity, unspecified: Secondary | ICD-10-CM | POA: Diagnosis present

## 2023-08-14 DIAGNOSIS — R339 Retention of urine, unspecified: Secondary | ICD-10-CM | POA: Diagnosis present

## 2023-08-14 DIAGNOSIS — E871 Hypo-osmolality and hyponatremia: Secondary | ICD-10-CM | POA: Diagnosis not present

## 2023-08-14 LAB — PROTIME-INR
INR: 1.4 — ABNORMAL HIGH (ref 0.8–1.2)
Prothrombin Time: 16.9 seconds — ABNORMAL HIGH (ref 11.4–15.2)

## 2023-08-14 LAB — CBC WITH DIFFERENTIAL/PLATELET
Abs Immature Granulocytes: 0 10*3/uL (ref 0.00–0.07)
Basophils Absolute: 0 10*3/uL (ref 0.0–0.1)
Basophils Relative: 0 %
Eosinophils Absolute: 0 10*3/uL (ref 0.0–0.5)
Eosinophils Relative: 0 %
HCT: 37.4 % — ABNORMAL LOW (ref 39.0–52.0)
Hemoglobin: 10.9 g/dL — ABNORMAL LOW (ref 13.0–17.0)
Lymphocytes Relative: 1 %
Lymphs Abs: 0.3 10*3/uL — ABNORMAL LOW (ref 0.7–4.0)
MCH: 27.9 pg (ref 26.0–34.0)
MCHC: 29.1 g/dL — ABNORMAL LOW (ref 30.0–36.0)
MCV: 95.9 fL (ref 80.0–100.0)
Monocytes Absolute: 0.3 10*3/uL (ref 0.1–1.0)
Monocytes Relative: 1 %
Neutro Abs: 30.6 10*3/uL — ABNORMAL HIGH (ref 1.7–7.7)
Neutrophils Relative %: 98 %
Platelets: 449 10*3/uL — ABNORMAL HIGH (ref 150–400)
RBC: 3.9 MIL/uL — ABNORMAL LOW (ref 4.22–5.81)
RDW: 19 % — ABNORMAL HIGH (ref 11.5–15.5)
WBC: 31.2 10*3/uL — ABNORMAL HIGH (ref 4.0–10.5)
nRBC: 0 /100 WBC
nRBC: 0.1 % (ref 0.0–0.2)

## 2023-08-14 LAB — COMPREHENSIVE METABOLIC PANEL
ALT: 21 U/L (ref 0–44)
AST: 41 U/L (ref 15–41)
Albumin: 2.3 g/dL — ABNORMAL LOW (ref 3.5–5.0)
Alkaline Phosphatase: 130 U/L — ABNORMAL HIGH (ref 38–126)
Anion gap: 20 — ABNORMAL HIGH (ref 5–15)
BUN: 26 mg/dL — ABNORMAL HIGH (ref 8–23)
CO2: 11 mmol/L — ABNORMAL LOW (ref 22–32)
Calcium: 9 mg/dL (ref 8.9–10.3)
Chloride: 110 mmol/L (ref 98–111)
Creatinine, Ser: 1.81 mg/dL — ABNORMAL HIGH (ref 0.61–1.24)
GFR, Estimated: 38 mL/min — ABNORMAL LOW (ref >60)
Glucose, Bld: 92 mg/dL (ref 70–99)
Potassium: 4.3 mmol/L (ref 3.5–5.1)
Sodium: 141 mmol/L (ref 135–145)
Total Bilirubin: 1.2 mg/dL (ref 0.3–1.2)
Total Protein: 6.1 g/dL — ABNORMAL LOW (ref 6.5–8.1)

## 2023-08-14 LAB — URINALYSIS, W/ REFLEX TO CULTURE (INFECTION SUSPECTED): WBC, UA: >50 WBC/hpf (ref 0–5)

## 2023-08-14 LAB — RESP PANEL BY RT-PCR (RSV, FLU A&B, COVID)  RVPGX2
Influenza A by PCR: NEGATIVE
Influenza B by PCR: NEGATIVE
Resp Syncytial Virus by PCR: NEGATIVE
SARS Coronavirus 2 by RT PCR: NEGATIVE

## 2023-08-14 LAB — I-STAT CG4 LACTIC ACID, ED
Lactic Acid, Venous: 12.2 mmol/L (ref 0.5–1.9)
Lactic Acid, Venous: 7.6 mmol/L (ref 0.5–1.9)

## 2023-08-14 LAB — APTT: aPTT: 31 seconds (ref 24–36)

## 2023-08-14 MED ORDER — ACETAMINOPHEN 650 MG RE SUPP: 650.0000 mg | Freq: Four times a day (QID) | RECTAL | Status: DC | PRN

## 2023-08-14 MED ORDER — ENOXAPARIN SODIUM 30 MG/0.3ML IJ SOSY
30.0000 mg | PREFILLED_SYRINGE | INTRAMUSCULAR | Status: DC
  Filled 2023-08-14: qty 0.3

## 2023-08-14 MED ORDER — DIPHENHYDRAMINE HCL 50 MG/ML IJ SOLN: 12.5000 mg | INTRAMUSCULAR | Status: DC | PRN

## 2023-08-14 MED ORDER — CHOLESTYRAMINE 4 G PO PACK: 1.0000 | PACK | Freq: Two times a day (BID) | ORAL | Status: DC

## 2023-08-14 MED ORDER — SULFASALAZINE 500 MG PO TABS: 1000.0000 mg | ORAL_TABLET | Freq: Four times a day (QID) | ORAL | Status: DC

## 2023-08-14 MED ORDER — MOMETASONE FURO-FORMOTEROL FUM 200-5 MCG/ACT IN AERO
2.0000 | INHALATION_SPRAY | Freq: Two times a day (BID) | RESPIRATORY_TRACT | Status: DC
  Filled 2023-08-14: qty 8.8

## 2023-08-14 MED ORDER — SODIUM CHLORIDE 0.9% FLUSH: 3.0000 mL | Freq: Two times a day (BID) | INTRAVENOUS | Status: DC

## 2023-08-14 MED ORDER — ATORVASTATIN CALCIUM 40 MG PO TABS: 40.0000 mg | ORAL_TABLET | Freq: Every day | ORAL | Status: DC

## 2023-08-14 MED ORDER — HALOPERIDOL LACTATE 2 MG/ML PO CONC: 0.5000 mg | ORAL | Status: DC | PRN

## 2023-08-14 MED ORDER — ACETAMINOPHEN 650 MG RE SUPP
650.0000 mg | Freq: Once | RECTAL | Status: AC
  Administered 2023-08-14: 650 mg via RECTAL
  Filled 2023-08-14: qty 1

## 2023-08-14 MED ORDER — ESCITALOPRAM OXALATE 10 MG PO TABS: 20.0000 mg | ORAL_TABLET | Freq: Every day | ORAL | Status: DC

## 2023-08-14 MED ORDER — MORPHINE SULFATE (PF) 2 MG/ML IV SOLN: 1.0000 mg | INTRAVENOUS | Status: DC | PRN

## 2023-08-14 MED ORDER — MORPHINE SULFATE (PF) 4 MG/ML IV SOLN
4.0000 mg | Freq: Once | INTRAVENOUS | Status: AC
  Administered 2023-08-14: 4 mg via INTRAVENOUS
  Filled 2023-08-14: qty 1

## 2023-08-14 MED ORDER — DIAZEPAM 5 MG/ML IJ SOLN
5.0000 mg | Freq: Once | INTRAMUSCULAR | Status: AC
  Administered 2023-08-14: 5 mg via INTRAVENOUS
  Filled 2023-08-14: qty 2

## 2023-08-14 MED ORDER — ALBUTEROL SULFATE (2.5 MG/3ML) 0.083% IN NEBU: 2.5000 mg | INHALATION_SOLUTION | RESPIRATORY_TRACT | Status: DC | PRN

## 2023-08-14 MED ORDER — NOREPINEPHRINE 4 MG/250ML-% IV SOLN
INTRAVENOUS | Status: AC
  Administered 2023-08-14: 4 mg via INTRAVENOUS
  Filled 2023-08-14: qty 250

## 2023-08-14 MED ORDER — SODIUM CHLORIDE 0.9 % IV SOLN: 2.0000 g | INTRAVENOUS | Status: DC

## 2023-08-14 MED ORDER — SODIUM CHLORIDE 0.9 % IV SOLN
2.0000 g | Freq: Once | INTRAVENOUS | Status: AC
  Administered 2023-08-14: 2 g via INTRAVENOUS
  Filled 2023-08-14: qty 12.5

## 2023-08-14 MED ORDER — ACETAMINOPHEN 325 MG PO TABS: 650.0000 mg | ORAL_TABLET | Freq: Four times a day (QID) | ORAL | Status: DC | PRN

## 2023-08-14 MED ORDER — LORAZEPAM 2 MG/ML PO CONC: 1.0000 mg | ORAL | Status: DC | PRN

## 2023-08-14 MED ORDER — METRONIDAZOLE 500 MG/100ML IV SOLN
500.0000 mg | Freq: Once | INTRAVENOUS | Status: AC
  Administered 2023-08-14: 500 mg via INTRAVENOUS
  Filled 2023-08-14: qty 100

## 2023-08-14 MED ORDER — VANCOMYCIN HCL 1500 MG/300ML IV SOLN
1500.0000 mg | Freq: Once | INTRAVENOUS | Status: AC
  Administered 2023-08-14: 1500 mg via INTRAVENOUS
  Filled 2023-08-14: qty 300

## 2023-08-14 MED ORDER — MORPHINE SULFATE (CONCENTRATE) 10 MG/0.5ML PO SOLN: 5.0000 mg | ORAL | Status: DC | PRN

## 2023-08-14 MED ORDER — POLYETHYLENE GLYCOL 3350 17 G PO PACK: 17.0000 g | PACK | Freq: Every day | ORAL | Status: DC | PRN

## 2023-08-14 MED ORDER — HALOPERIDOL LACTATE 5 MG/ML IJ SOLN: 0.5000 mg | INTRAMUSCULAR | Status: DC | PRN

## 2023-08-14 MED ORDER — VANCOMYCIN HCL IN DEXTROSE 1-5 GM/200ML-% IV SOLN: 1000.0000 mg | INTRAVENOUS | Status: DC

## 2023-08-14 MED ORDER — POLYVINYL ALCOHOL 1.4 % OP SOLN: 2.0000 [drp] | Freq: Three times a day (TID) | OPHTHALMIC | Status: DC | PRN

## 2023-08-14 MED ORDER — LORAZEPAM 1 MG PO TABS: 1.0000 mg | ORAL_TABLET | ORAL | Status: DC | PRN

## 2023-08-14 MED ORDER — MORPHINE 100MG IN NS 100ML (1MG/ML) PREMIX INFUSION
5.0000 mg/h | INTRAVENOUS | Status: DC
  Administered 2023-08-14 – 2023-08-18 (×5): 5 mg/h via INTRAVENOUS
  Filled 2023-08-14 (×5): qty 100

## 2023-08-14 MED ORDER — METRONIDAZOLE 500 MG/100ML IV SOLN: 500.0000 mg | Freq: Two times a day (BID) | INTRAVENOUS | Status: DC

## 2023-08-14 MED ORDER — HYDROMORPHONE HCL 1 MG/ML IJ SOLN
1.0000 mg | Freq: Once | INTRAMUSCULAR | Status: AC
  Administered 2023-08-14: 1 mg via INTRAVENOUS
  Filled 2023-08-14: qty 1

## 2023-08-14 MED ORDER — PANTOPRAZOLE SODIUM 40 MG PO TBEC: 40.0000 mg | DELAYED_RELEASE_TABLET | Freq: Every day | ORAL | Status: DC

## 2023-08-14 MED ORDER — NOREPINEPHRINE 4 MG/250ML-% IV SOLN: 2.0000 ug/min | INTRAVENOUS | Status: DC

## 2023-08-14 MED ORDER — HALOPERIDOL 1 MG PO TABS: 0.5000 mg | ORAL_TABLET | ORAL | Status: DC | PRN

## 2023-08-14 MED ORDER — LACTATED RINGERS IV BOLUS (SEPSIS)
1000.0000 mL | Freq: Once | INTRAVENOUS | Status: AC
  Administered 2023-08-14: 1000 mL via INTRAVENOUS

## 2023-08-14 MED ORDER — CYCLOSPORINE 0.05 % OP EMUL
1.0000 [drp] | Freq: Two times a day (BID) | OPHTHALMIC | Status: DC
  Administered 2023-08-15 – 2023-08-17 (×4): 1 [drp] via OPHTHALMIC
  Filled 2023-08-14 (×8): qty 30

## 2023-08-14 MED ORDER — MORPHINE BOLUS VIA INFUSION
1.0000 mg | INTRAVENOUS | Status: DC | PRN
  Administered 2023-08-14: 1 mg via INTRAVENOUS

## 2023-08-14 MED ORDER — EZETIMIBE 10 MG PO TABS: 10.0000 mg | ORAL_TABLET | Freq: Every day | ORAL | Status: DC

## 2023-08-14 MED ORDER — ACETAMINOPHEN 650 MG RE SUPP
650.0000 mg | Freq: Four times a day (QID) | RECTAL | Status: DC | PRN
  Administered 2023-08-17: 650 mg via RECTAL
  Filled 2023-08-14: qty 1

## 2023-08-14 MED ORDER — ONDANSETRON 4 MG PO TBDP: 4.0000 mg | ORAL_TABLET | Freq: Four times a day (QID) | ORAL | Status: DC | PRN

## 2023-08-14 MED ORDER — ONDANSETRON HCL 4 MG/2ML IJ SOLN: 4.0000 mg | Freq: Four times a day (QID) | INTRAMUSCULAR | Status: DC | PRN

## 2023-08-14 MED ORDER — LORAZEPAM 2 MG/ML IJ SOLN
1.0000 mg | INTRAMUSCULAR | Status: DC | PRN
  Administered 2023-08-14 – 2023-08-15 (×2): 1 mg via INTRAVENOUS
  Filled 2023-08-14 (×2): qty 1

## 2023-08-14 MED ORDER — LACTATED RINGERS IV BOLUS
1000.0000 mL | Freq: Once | INTRAVENOUS | Status: AC
  Administered 2023-08-14: 1000 mL via INTRAVENOUS

## 2023-08-14 MED ORDER — LACTATED RINGERS IV SOLN: INTRAVENOUS | Status: AC

## 2023-08-14 MED ORDER — LATANOPROST 0.005 % OP SOLN
1.0000 [drp] | Freq: Every day | OPHTHALMIC | Status: DC
  Filled 2023-08-14: qty 2.5

## 2023-08-14 MED ORDER — SODIUM CHLORIDE 0.9 % IV SOLN: 250.0000 mL | INTRAVENOUS | Status: DC

## 2023-08-14 MED ORDER — IPRATROPIUM-ALBUTEROL 0.5-2.5 (3) MG/3ML IN SOLN
3.0000 mL | Freq: Four times a day (QID) | RESPIRATORY_TRACT | Status: DC
  Filled 2023-08-14: qty 3

## 2023-08-14 MED ORDER — MONTELUKAST SODIUM 10 MG PO TABS: 10.0000 mg | ORAL_TABLET | Freq: Every day | ORAL | Status: DC

## 2023-08-14 NOTE — H&P (Signed)
History and Physical   Barry [name] EXB:[id number] DOB: 09/25/1946 DOA: 08/14/2023  PCP: Georgann Housekeeper, MD   Patient coming from: SNF  Chief Complaint: Altered mental status, shortness of breath  HPI: Barry [name] is a 77 y.o. male with medical history significant of hypertension, hyperlipidemia, CAD status post CABG, GERD, atrial fibrillation, asthma, obesity, bladder cancer, ulcerative colitis presenting with altered mental status and shortness of breath.   History obtained with assistance of chart review and family due to patient's altered mentation. Patient was admitted from 8/4 until 8/17 with a UTI that grew out E. coli.  Does have history of bladder cancer s/p resection in July and had a second stage done on 8/6 by urology.  Discharged to nursing facility with DNR.  Discussions had during that admission the patient is not a candidate for aggressive cancer therapies as he is significantly frail and debilitated.  Patient's caregiver/POA have been checking on him daily at facility and he he remained frail but had been doing okay up until yesterday.  Today he seemed to be short of breath and more altered.  EMS was called heart rate was in the 90s, blood pressure in the 100s, saturations around 80%.  Improved on nonrebreather.  Patient transported to the ED for further evaluation.  ED Course: Vital signs in the ED notable blood pressure in the 70-80 systolic, transiently on pressors but this was discontinued after EDP discussion with family.  Heart rate in the 100s to 130s, respiratory rate in the 20s to 30s.  Lab workup included CMP with bicarb 11, BUN 26, creatinine elevated to 1.8 from baseline 0.6, anion gap 20, protein 6.1, albumin 2.3, alk phos 130.  CBC with leukocytosis to 31.2, hemoglobin 10.9 from 8.42 weeks ago, platelets elevated to 449.  PT and INR elevated at 16.9 and 1.4 respectively.  Lactic acid initially elevated to 12.2, 7.6 on repeat.  Urinalysis was brown and turbid with  unable to evaluate other labs did show bacteria.  Urine culture and blood culture pending.  Chest x-ray without acute normality.  Patient received 3 L IV fluids and started on a rate of IV fluids in the ED.  Also received broad-spectrum antibiotics including vancomycin, cefepime, Flagyl.  Received Dilaudid and morphine as well as dose of Valium.  EDP had discussions with patient's POA.  Patient is DNR/DNI and they have discussed not pursuing pressors to support blood pressure. Initially okay with IV fluids and antibiotics. Now would like to transfer to comfort measures.  Review of Systems: Unable to obtain review of systems due to patient's altered mentation.  Past Medical History:  Diagnosis Date   A-fib [facility])    Acute sinusitis 03/24/2012   Followed in Pulmonary clinic/ [facility]/ [name] - CT sinus 08/07/2013  Short air-fluid levels in the maxillary sinuses bilaterally suggesting early acute sinusitis. - repeat ct sinus 04/30/2014 > ok     Acute sinusitis, unspecified 08/14/2010   Qualifier: Diagnosis of  By: Clent Ridges NP, [name]     Anemia    Asthma    PFTs 06/15/05 FEV1 85% predicted ratio 68% and truncation of resp loop in a sawtooth pattern. HFA 25% 08-01-2009 >50% Nov 01, 2009>50% 01-31-2010   Asthma, chronic 11/24/2007   Followed in Pulmonary clinic/ [facility]/ [name]  - PFTs 06/15/2005 FEV1 85% predicted ratio 68% and truncation of respiratory loop in a sawtooth pattern  - HFA 25% August 01, 2009 > 50% November 01, 2009 > 50% January 31, 2010 therefore changed  to neb bud/brovana - HFA 75% p extensive coaching 08/07/2013  > insurance issues so try symbicort 160 2 bid instead of neb - 11/16/2015   p extensive   Bladder mass    BRONCHITIS, ACUTE 01/22/2011   Qualifier: Diagnosis of  By: Clent Ridges NP, [name]     CAD (coronary artery disease), native coronary artery 02/06/2019   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   Cataract    Chronic cough    Chronic rhinitis  05/02/2009   Followed in Pulmonary clinic/ [facility]/ [name]    - 03/24/12 CT Sinus > Negative paranasal sinuses - Sinus CT 08/07/2013 >>Short air-fluid levels in the maxillary sinuses bilaterally suggesting early acute sinusitis - Repeat augmentin x 21 days 03/27/2014 then sinus ct if not better  - flonase/afrin regimen 02/11/15 >>>      Coronary heart disease    COUGH, CHRONIC 11/24/2007   Followed in Pulmonary clinic/ [facility]/ [name]      DDD (degenerative disc disease), lumbar    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   Followed in Pulmonary clinic/ [facility]/ [name]  -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     Excessive ear wax, bilateral 09/15/2018   GASTROESOPHAGEAL REFLUX DISEASE 08/27/2008   Followed as Primary Care Patient/ GI/ Dr  Danise Edge     Glaucoma    Hyperlipidemia LDL goal <70 02/06/2019   Hypertension    Obesity    Osteoarthritis    Paralyzed hemidiaphragm    left, 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   Retina disorder 2014   Severe obesity (BMI >= 40) (HCC) 11/24/2007       - Target wt < 191 to get under BMI 30    Ulcerative colitis    ULCERATIVE COLITIS 11/24/2007   Followed as Primary Care Patient/ GI/ Dr  Danise Edge     URI 04/09/2008   Qualifier: Diagnosis of  By: Clent Ridges NP, [name]      Past Surgical History:  Procedure Laterality Date   CARDIAC CATHETERIZATION Left    COLONOSCOPY WITH PROPOFOL N/A 11/29/2015   Procedure: COLONOSCOPY WITH PROPOFOL;  Surgeon: Charolett Bumpers, MD;  Location: [facility];  Service: Endoscopy;  Laterality: N/A;   CORONARY ARTERY BYPASS GRAFT     x6   TRANSURETHRAL RESECTION OF BLADDER TUMOR N/A 06/28/2023   Procedure: CYSTOSCOPY TRANSURETHRAL RESECTION OF BLADDER TUMOR (TURBT);  Surgeon: Bjorn Pippin, MD;  Location: [facility];  Service: Urology;  Laterality: N/A;   TRANSURETHRAL RESECTION OF BLADDER TUMOR N/A 07/23/2023   Procedure: TRANSURETHRAL RESECTION OF BLADDER TUMOR (TURBT);  Surgeon: Bjorn Pippin, MD;  Location:  [facility];  Service: Urology;  Laterality: N/A;  80 MINS FOR CASE    Social History  reports that he has never smoked. He has never used smokeless tobacco. He reports that he does not drink alcohol and does not use drugs.  Allergies  Allergen Reactions   Piroxicam Itching and Rash    Family History  Problem Relation Age of Onset   Heart disease Father    Hypertension Mother    Renal Disease Mother    Pulmonary Hypertension Mother    Other Sister        UNKNOWN HISTORY  Reviewed on admission  Prior to Admission medications   Medication Sig Start Date End Date Taking? Authorizing Provider  acetaminophen (TYLENOL) 325 MG tablet Take 2 tablets (650 mg total) by mouth every 4 (four) hours as needed for mild pain (or Fever >/= 101). 06/10/23  Yes Lewie Chamber,  MD  acetaminophen (TYLENOL) 500 MG tablet Take 1,000 mg by mouth in the morning and at bedtime.   Yes [provider]  albuterol (VENTOLIN HFA) 108 (90 Base) MCG/ACT inhaler Inhale 2 puffs into the lungs every 4 (four) hours as needed for wheezing or shortness of breath.   Yes [provider]  atorvastatin (LIPITOR) 40 MG tablet Take 40 mg by mouth at bedtime.   Yes [provider]  budesonide-formoterol (SYMBICORT) 160-4.5 MCG/ACT inhaler Inhale 2 puffs into the lungs 2 (two) times daily.   Yes [provider]  Carboxymeth-Glycerin-Polysorb (REFRESH DIGITAL) 0.5-1-0.5 % SOLN Place 2 drops into both eyes every 8 (eight) hours as needed (dry eyes).   Yes [provider]  cholecalciferol (VITAMIN D3) 25 MCG (1000 UNIT) tablet Take 2,000 Units by mouth daily.   Yes [provider]  cholestyramine (QUESTRAN) 4 g packet Take 1 packet by mouth 2 (two) times daily. 07/02/23 07/01/24 Yes Rhetta Mura, MD  cycloSPORINE (RESTASIS) 0.05 % ophthalmic emulsion Place 1 drop into both eyes 2 (two) times daily.   Yes [provider]  diltiazem (CARDIZEM CD) 120 MG 24 hr capsule Take 1  capsule (120 mg total) by mouth daily. 08/04/23  Yes Kc, Dayna Barker, MD  escitalopram (LEXAPRO) 20 MG tablet Take 1 tablet (20 mg total) by mouth daily. 07/02/23  Yes Rhetta Mura, MD  ezetimibe (ZETIA) 10 MG tablet Take 10 mg by mouth daily.   Yes [provider]  ferrous sulfate 325 (65 FE) MG EC tablet Take 325 mg by mouth daily.   Yes [provider]  LACTOBACILLUS PROBIOTIC PO Take 1 capsule by mouth in the morning and at bedtime. 10 billion cell   Yes [provider]  latanoprost (XALATAN) 0.005 % ophthalmic solution Place 1 drop into both eyes at bedtime.   Yes [provider]  loperamide (IMODIUM) 2 MG capsule Take 4 mg by mouth in the morning and at bedtime.   Yes [provider]  magnesium hydroxide (MILK OF MAGNESIA) 400 MG/5ML suspension Take 30 mLs by mouth daily as needed for mild constipation.   Yes [provider]  metoprolol tartrate (LOPRESSOR) 25 MG tablet Take 0.5 tablets (12.5 mg total) by mouth 2 (two) times daily. 08/03/23  Yes Kc, Dayna Barker, MD  montelukast (SINGULAIR) 10 MG tablet Take 10 mg by mouth at bedtime.   Yes [provider]  Multiple Vitamins-Minerals (MULTIVITAMIN WITH MINERALS) tablet Take 1 tablet by mouth daily.   Yes [provider]  nitroGLYCERIN (NITROSTAT) 0.4 MG SL tablet Place 1 tablet (0.4 mg total) under the tongue every 5 (five) minutes as needed for chest pain. 01/12/21  Yes Wendall Stade, MD  pantoprazole (PROTONIX) 40 MG tablet Take 1 tablet (40 mg total) by mouth daily. 07/03/23  Yes Rhetta Mura, MD  sulfaSALAzine (AZULFIDINE) 500 MG tablet Take 1,000 mg by mouth 4 (four) times daily.   Yes [provider]    Physical Exam: Vitals:   08/14/23 1430 08/14/23 1440 08/14/23 1450 08/14/23 1500  BP: (!) 81/52 (!) 80/56 (!) 74/61 (!) 76/58  Pulse:  (!) 129 (!) 128 (!) 124  Resp: (!) 29 (!) 25 (!) 27 19  Temp:      TempSrc:      SpO2:  99% 99% 99%    Physical  Exam Constitutional:      General: He is in acute distress.     Appearance: He is ill-appearing and toxic-appearing.  HENT:  Head: Normocephalic and atraumatic.     Mouth/Throat:     Mouth: Mucous membranes are moist.     Pharynx: Oropharynx is clear.  Eyes:     Extraocular Movements: Extraocular movements intact.     Pupils: Pupils are equal, round, and reactive to light.  Cardiovascular:     Rate and Rhythm: Tachycardia present. Rhythm irregular.     Pulses: Normal pulses.     Heart sounds: Normal heart sounds.  Pulmonary:     Effort: Pulmonary effort is normal. No respiratory distress.     Breath sounds: Rhonchi present.  Abdominal:     General: Bowel sounds are normal. There is no distension.     Palpations: Abdomen is soft.     Tenderness: There is no abdominal tenderness.  Musculoskeletal:        General: No swelling or deformity.  Skin:    General: Skin is warm and dry.  Neurological:     General: No focal deficit present.     Mental Status: Mental status is at baseline.    Labs on Admission: I have personally reviewed following labs and imaging studies  CBC: Recent Labs  Lab 08/14/23 1122  WBC 31.2*  NEUTROABS 30.6*  HGB 10.9*  HCT 37.4*  MCV 95.9  PLT 449*    Basic Metabolic Panel: Recent Labs  Lab 08/14/23 1122  NA 141  K 4.3  CL 110  CO2 11*  GLUCOSE 92  BUN 26*  CREATININE 1.81*  CALCIUM 9.0    GFR: CrCl cannot be calculated (Unknown ideal weight.).  Liver Function Tests: Recent Labs  Lab 08/14/23 1122  AST 41  ALT 21  ALKPHOS 130*  BILITOT 1.2  PROT 6.1*  ALBUMIN 2.3*    Urine analysis:    Component Value Date/Time   COLORURINE BROWN (A) 08/14/2023 1057   APPEARANCEUR TURBID (A) 08/14/2023 1057   LABSPEC  08/14/2023 1057    TEST NOT REPORTED DUE TO COLOR INTERFERENCE OF URINE PIGMENT   PHURINE  08/14/2023 1057    TEST NOT REPORTED DUE TO COLOR INTERFERENCE OF URINE PIGMENT   GLUCOSEU (A) 08/14/2023 1057    TEST NOT  REPORTED DUE TO COLOR INTERFERENCE OF URINE PIGMENT   HGBUR (A) 08/14/2023 1057    TEST NOT REPORTED DUE TO COLOR INTERFERENCE OF URINE PIGMENT   BILIRUBINUR (A) 08/14/2023 1057    TEST NOT REPORTED DUE TO COLOR INTERFERENCE OF URINE PIGMENT   KETONESUR (A) 08/14/2023 1057    TEST NOT REPORTED DUE TO COLOR INTERFERENCE OF URINE PIGMENT   PROTEINUR (A) 08/14/2023 1057    TEST NOT REPORTED DUE TO COLOR INTERFERENCE OF URINE PIGMENT   UROBILINOGEN 0.2 11/12/2013 0047   NITRITE (A) 08/14/2023 1057    TEST NOT REPORTED DUE TO COLOR INTERFERENCE OF URINE PIGMENT   LEUKOCYTESUR (A) 08/14/2023 1057    TEST NOT REPORTED DUE TO COLOR INTERFERENCE OF URINE PIGMENT    Radiological Exams on Admission: DG Chest Port 1 View  Result Date: 08/14/2023 CLINICAL DATA:  Sepsis, coronary artery disease. EXAM: PORTABLE CHEST 1 VIEW COMPARISON:  July 21, 2023. FINDINGS: Status post coronary artery bypass graft. Elevated left hemidiaphragm is again noted. No acute pulmonary disease. Degenerative changes are seen involving both shoulders. IMPRESSION: No active disease. Electronically Signed   By: Lupita Raider M.D.   On: 08/14/2023 11:36    EKG: Independently reviewed.  Atrial fibrillation with RVR at 163.  Assessment/Plan Principal Problem:   Severe sepsis (HCC) Active Problems:  Asthma, chronic   GASTROESOPHAGEAL REFLUX DISEASE   ULCERATIVE COLITIS   CAD (coronary artery disease), native coronary artery   Benign essential HTN   Hyperlipidemia LDL goal <70   Hx of CABG   Malignant neoplasm of overlapping sites of bladder (HCC)   Class 2 obesity   Paroxysmal atrial fibrillation with RVR (HCC)   Severe sepsis > Patient more altered today at facility and short of breath.  Chest x-ray clear.  Presumed urinary source with brown and turbid urine and history of bladder cancer and recent admission for E. coli UTI. > Patient significantly frail and not a candidate for advanced cancer therapies and was  discharged with DNR 2 facility for hopeful acute rehab. > Leukocytosis to 31.2.  Initial lactic acid 12.2 with repeat 7.6.  Has received 3 L IV fluid and is on a rate of fluid with blood pressure still in the 70s to 80s systolic.  Also tachycardic in the 120s to 130s as below.  Urinalysis with bacteria but other whites unable to be interpreted due to brown/turbid urine. - Monitor in progressive unit - Continue with IV fluids  Addendum > After discussion with POA.  Patient is now being transition to comfort care. - Comfort orders placed including as needed pain medication, as needed anxiety medication, as needed agitation medication, as needed itching medication, as needed nausea medication, as needed dyspnea/wheezing medication - Consult to palliative care placed - Patient now admitted to palliative care unit  Hypertension - Holding antihypertensives  Hyperlipidemia CAD > Status post CABG - Holding medications in setting of hypotension and comfort care   GERD - Continue PPI when able to tolerate p.o.  Atrial fibrillation with RVR > Heart rate in the 120s 130s in the ED. -Noted, now comfort care, no further intervention.  Asthma - Replace home Symbicort with formulary Dulera - Continue with scheduled and as needed inhalers for comfort  Obesity - Noted  Bladder cancer > History of bladder cancer and is status post multiple resections.  Not a candidate for advanced therapies due to his frail status. - Noted, now on comfort as above.  DVT prophylaxis: Lovenox Code Status:   NR/DNI, no pressors.  Now transitioning to comfort care. Family Communication:  POA/friend updated at bedside Disposition Plan:   Patient is from:  SNF  Anticipated DC to:  Pending clinical course  Anticipated DC date:  1 to 7 days  Anticipated DC barriers: None  Consults called:  None Admission status:  Inpatient, progressive  Severity of Illness: The appropriate patient status for this patient is  INPATIENT. Inpatient status is judged to be reasonable and necessary in order to provide the required intensity of service to ensure the patient's safety. The patient's presenting symptoms, physical exam findings, and initial radiographic and laboratory data in the context of their chronic comorbidities is felt to place them at high risk for further clinical deterioration. Furthermore, it is not anticipated that the patient will be medically stable for discharge from the hospital within 2 midnights of admission.   * I certify that at the point of admission it is my clinical judgment that the patient will require inpatient hospital care spanning beyond 2 midnights from the point of admission due to high intensity of service, high risk for further deterioration and high frequency of surveillance required.Synetta Fail MD [facility]  How to contact the [facility] Attending or Consulting provider 7A - 7P or covering provider during after hours 7P -7A, for  this patient?   Check the care team in [facility] and look for a) attending/consulting [facility] provider listed and b) the [facility] team listed Log into [email or web address] and use [facility] universal password to access. If you do not have the password, please contact the hospital operator. Locate the [facility] provider you are looking for under [facility] and page to a number that you can be directly reached. If you still have difficulty reaching the provider, please page the [facility] (Director on Call) for the Hospitalists listed on amion for assistance.  08/14/2023, 3:43 PM

## 2023-08-14 NOTE — Sepsis Progress Note (Signed)
eLink is following this Code Sepsis. °

## 2023-08-14 NOTE — Progress Notes (Signed)
ED Pharmacy Antibiotic Sign Off An antibiotic consult was received from an ED provider for Vancomycin and Cefepime per pharmacy dosing for Sepsis. A chart review was completed to assess appropriateness.   Has bladder tumor. Hx left nephrectomy due to atrophy. Recent E.coli UTI only resistant to gentamicin. Scr at BL at 0.58.   The following one time order(s) were placed:  Vancomycin 1500mg  IV Cefepime 2g IV  Further antibiotic and/or antibiotic pharmacy consults should be ordered by the admitting provider if indicated.   Thank you for allowing pharmacy to be a part of this patient's care.   Eldridge Scot, PharmD, BCCCP Clinical Pharmacist 08/14/2023, 11:05 AM

## 2023-08-14 NOTE — ED Provider Notes (Signed)
EMERGENCY DEPARTMENT AT [facility] Provider Note   CSN: [id number] Arrival date & time: 08/14/23  1048     History  No chief complaint on file.   Barry [name] is a 77 y.o. male.  Pt is a 77 yo male with pmhx significant for afib (no longer on blood thinners), htn, cad, asthma, cataracts, CAD, hld, gerd, bladder cancer, and urinary retention requiring indwelling foley.  Pt was admitted from 8/4-8/17 for UTI with fc (greq out e.coli).  pt had a bladder resection in July and had the second stage done on 8/6.  Pt was d/c with a DNR form to SNF.  Pt's POA has been seeing him daily and he seemed ok last night.  This am, he seemed to be having sob, so EMS was called.  HR in the 90s for EMS and BP 102.  MES said O2 sat was 79% on RA.  He was put on a NRB and O2 sat 97%.  He is unable to give any hx.         Home Medications Prior to Admission medications   Medication Sig Start Date End Date Taking? Authorizing Provider  acetaminophen (TYLENOL) 325 MG tablet Take 2 tablets (650 mg total) by mouth every 4 (four) hours as needed for mild pain (or Fever >/= 101). 06/10/23  Yes Lewie Chamber, MD  acetaminophen (TYLENOL) 500 MG tablet Take 1,000 mg by mouth in the morning and at bedtime.   Yes [provider]  albuterol (VENTOLIN HFA) 108 (90 Base) MCG/ACT inhaler Inhale 2 puffs into the lungs every 4 (four) hours as needed for wheezing or shortness of breath.   Yes [provider]  atorvastatin (LIPITOR) 40 MG tablet Take 40 mg by mouth at bedtime.   Yes [provider]  budesonide-formoterol (SYMBICORT) 160-4.5 MCG/ACT inhaler Inhale 2 puffs into the lungs 2 (two) times daily.   Yes [provider]  Carboxymeth-Glycerin-Polysorb (REFRESH DIGITAL) 0.5-1-0.5 % SOLN Place 2 drops into both eyes every 8 (eight) hours as needed (dry eyes).   Yes [provider]  cholecalciferol (VITAMIN D3) 25 MCG (1000 UNIT) tablet Take 2,000 Units  by mouth daily.   Yes [provider]  cholestyramine (QUESTRAN) 4 g packet Take 1 packet by mouth 2 (two) times daily. 07/02/23 07/01/24 Yes Rhetta Mura, MD  cycloSPORINE (RESTASIS) 0.05 % ophthalmic emulsion Place 1 drop into both eyes 2 (two) times daily.   Yes [provider]  diltiazem (CARDIZEM CD) 120 MG 24 hr capsule Take 1 capsule (120 mg total) by mouth daily. 08/04/23  Yes Kc, Dayna Barker, MD  escitalopram (LEXAPRO) 20 MG tablet Take 1 tablet (20 mg total) by mouth daily. 07/02/23  Yes Rhetta Mura, MD  ezetimibe (ZETIA) 10 MG tablet Take 10 mg by mouth daily.   Yes [provider]  ferrous sulfate 325 (65 FE) MG EC tablet Take 325 mg by mouth daily.   Yes [provider]  LACTOBACILLUS PROBIOTIC PO Take 1 capsule by mouth in the morning and at bedtime. 10 billion cell   Yes [provider]  latanoprost (XALATAN) 0.005 % ophthalmic solution Place 1 drop into both eyes at bedtime.   Yes [provider]  loperamide (IMODIUM) 2 MG capsule Take 4 mg by mouth in the morning and at bedtime.   Yes [provider]  magnesium hydroxide (MILK OF MAGNESIA) 400 MG/5ML suspension Take 30 mLs by mouth daily as needed for mild constipation.  Yes [provider]  metoprolol tartrate (LOPRESSOR) 25 MG tablet Take 0.5 tablets (12.5 mg total) by mouth 2 (two) times daily. 08/03/23  Yes Kc, Dayna Barker, MD  montelukast (SINGULAIR) 10 MG tablet Take 10 mg by mouth at bedtime.   Yes [provider]  Multiple Vitamins-Minerals (MULTIVITAMIN WITH MINERALS) tablet Take 1 tablet by mouth daily.   Yes [provider]  nitroGLYCERIN (NITROSTAT) 0.4 MG SL tablet Place 1 tablet (0.4 mg total) under the tongue every 5 (five) minutes as needed for chest pain. 01/12/21  Yes Wendall Stade, MD  pantoprazole (PROTONIX) 40 MG tablet Take 1 tablet (40 mg total) by mouth daily. 07/03/23  Yes Rhetta Mura, MD  sulfaSALAzine  (AZULFIDINE) 500 MG tablet Take 1,000 mg by mouth 4 (four) times daily.   Yes [provider]      Allergies    Piroxicam    Review of Systems   Review of Systems  Unable to perform ROS: Mental status change  All other systems reviewed and are negative.   Physical Exam Updated Vital Signs BP (!) 76/58   Pulse (!) 124   Temp 100.1 F (37.8 C) (Rectal)   Resp 19   SpO2 99%  Physical Exam Vitals and nursing note reviewed.  Constitutional:      General: He is in acute distress.     Appearance: He is ill-appearing and toxic-appearing.  HENT:     Head: Normocephalic and atraumatic.     Right Ear: External ear normal.     Left Ear: External ear normal.     Nose: Nose normal.     Mouth/Throat:     Mouth: Mucous membranes are dry.  Eyes:     Extraocular Movements: Extraocular movements intact.     Conjunctiva/sclera: Conjunctivae normal.     Pupils: Pupils are equal, round, and reactive to light.  Cardiovascular:     Rate and Rhythm: Tachycardia present. Rhythm irregular.     Pulses: Normal pulses.     Heart sounds: Normal heart sounds.  Pulmonary:     Effort: Tachypnea present.  Abdominal:     General: Abdomen is flat. Bowel sounds are normal.     Palpations: Abdomen is soft.  Genitourinary:    Comments: FC in place.  Urine in bag looks like chocolate milk. Musculoskeletal:        General: Normal range of motion.     Cervical back: Normal range of motion and neck supple.  Skin:    Capillary Refill: Capillary refill takes more than 3 seconds.  Neurological:     Mental Status: He is alert. He is disoriented.     ED Results / Procedures / Treatments   Labs (all labs ordered are listed, but only abnormal results are displayed) Labs Reviewed  COMPREHENSIVE METABOLIC PANEL - Abnormal; Notable for the following components:      Result Value   CO2 11 (*)    BUN 26 (*)    Creatinine, Ser 1.81 (*)    Total Protein 6.1 (*)    Albumin 2.3 (*)    Alkaline  Phosphatase 130 (*)    GFR, Estimated 38 (*)    Anion gap 20 (*)    All other components within normal limits  CBC WITH DIFFERENTIAL/PLATELET - Abnormal; Notable for the following components:   WBC 31.2 (*)    RBC 3.90 (*)    Hemoglobin 10.9 (*)    HCT 37.4 (*)    MCHC 29.1 (*)  RDW 19.0 (*)    Platelets 449 (*)    Neutro Abs 30.6 (*)    Lymphs Abs 0.3 (*)    All other components within normal limits  PROTIME-INR - Abnormal; Notable for the following components:   Prothrombin Time 16.9 (*)    INR 1.4 (*)    All other components within normal limits  URINALYSIS, W/ REFLEX TO CULTURE (INFECTION SUSPECTED) - Abnormal; Notable for the following components:   Color, Urine BROWN (*)    APPearance TURBID (*)    Glucose, UA   (*)    Value: TEST NOT REPORTED DUE TO COLOR INTERFERENCE OF URINE PIGMENT   Hgb urine dipstick   (*)    Value: TEST NOT REPORTED DUE TO COLOR INTERFERENCE OF URINE PIGMENT   Bilirubin Urine   (*)    Value: TEST NOT REPORTED DUE TO COLOR INTERFERENCE OF URINE PIGMENT   Ketones, ur   (*)    Value: TEST NOT REPORTED DUE TO COLOR INTERFERENCE OF URINE PIGMENT   Protein, ur   (*)    Value: TEST NOT REPORTED DUE TO COLOR INTERFERENCE OF URINE PIGMENT   Nitrite   (*)    Value: TEST NOT REPORTED DUE TO COLOR INTERFERENCE OF URINE PIGMENT   Leukocytes,Ua   (*)    Value: TEST NOT REPORTED DUE TO COLOR INTERFERENCE OF URINE PIGMENT   Bacteria, UA FEW (*)    All other components within normal limits  I-STAT CG4 LACTIC ACID, ED - Abnormal; Notable for the following components:   Lactic Acid, Venous 12.2 (*)    All other components within normal limits  I-STAT CG4 LACTIC ACID, ED - Abnormal; Notable for the following components:   Lactic Acid, Venous 7.6 (*)    All other components within normal limits  RESP PANEL BY RT-PCR (RSV, FLU A&B, COVID)  RVPGX2  CULTURE, BLOOD (ROUTINE X 2)  CULTURE, BLOOD (ROUTINE X 2)  URINE CULTURE  APTT    EKG None  Radiology DG  Chest Port 1 View  Result Date: 08/14/2023 CLINICAL DATA:  Sepsis, coronary artery disease. EXAM: PORTABLE CHEST 1 VIEW COMPARISON:  July 21, 2023. FINDINGS: Status post coronary artery bypass graft. Elevated left hemidiaphragm is again noted. No acute pulmonary disease. Degenerative changes are seen involving both shoulders. IMPRESSION: No active disease. Electronically Signed   By: Lupita Raider M.D.   On: 08/14/2023 11:36    Procedures Procedures    Medications Ordered in ED Medications  lactated ringers infusion ( Intravenous New Bag/Given 08/14/23 1242)  0.9 %  sodium chloride infusion (0 mLs Intravenous Hold 08/14/23 1213)  lactated ringers bolus 1,000 mL (0 mLs Intravenous Stopped 08/14/23 1243)  metroNIDAZOLE (FLAGYL) IVPB 500 mg (0 mg Intravenous Stopped 08/14/23 1324)  vancomycin (VANCOREADY) IVPB 1500 mg/300 mL (0 mg Intravenous Stopped 08/14/23 1522)  ceFEPIme (MAXIPIME) 2 g in sodium chloride 0.9 % 100 mL IVPB (0 g Intravenous Stopped 08/14/23 1242)  acetaminophen (TYLENOL) suppository 650 mg (650 mg Rectal Given 08/14/23 1225)  lactated ringers bolus 1,000 mL (0 mLs Intravenous Stopped 08/14/23 1421)  lactated ringers bolus 1,000 mL (0 mLs Intravenous Stopped 08/14/23 1421)  morphine (PF) 4 MG/ML injection 4 mg (4 mg Intravenous Given 08/14/23 1344)  diazepam (VALIUM) injection 5 mg (5 mg Intravenous Given 08/14/23 1344)  HYDROmorphone (DILAUDID) injection 1 mg (1 mg Intravenous Given 08/14/23 1459)  diazepam (VALIUM) injection 5 mg (5 mg Intravenous Given 08/14/23 1459)    ED Course/ Medical Decision Making/  A&P                                 Medical Decision Making Amount and/or Complexity of Data Reviewed Labs: ordered. Radiology: ordered. ECG/medicine tests: ordered.  Risk OTC drugs. Prescription drug management. Decision regarding hospitalization.   This patient presents to the ED for concern of sob, this involves an extensive number of treatment options, and is a  complaint that carries with it a high risk of complications and morbidity.  The differential diagnosis includes pna, covid, PE, sepsis   Co morbidities that complicate the patient evaluation  afib (no longer on blood thinners), htn, cad, asthma, cataracts, CAD, hld, gerd, bladder cancer, and urinary retention requiring indwelling foley   Additional history obtained:  Additional history obtained from epic chart review External records from outside source obtained and reviewed including EMS report, POA   Lab Tests:  I Ordered, and personally interpreted labs.  The pertinent results include:  cbc with wbc elevated at 31.2, hgb 10.9 (8.4 on 8/11); lactic elevated at 12.2 (2nd lactic down to 7.6); inr 1.4; cmp with cr 1.81 (0.58 on 8/11); ua:  brown and turbid with >50 wbcs, 21-50 rbcs   Imaging Studies ordered:  I ordered imaging studies including cxr  I independently visualized and interpreted imaging which showed No active disease.  I agree with the radiologist interpretation   Cardiac Monitoring:  The patient was maintained on a cardiac monitor.  I personally viewed and interpreted the cardiac monitored which showed an underlying rhythm of: afib with rvr   Medicines ordered and prescription drug management:  I ordered medication including ivfs/abx/levo  for sx  Reevaluation of the patient after these medicines showed that the patient improved I have reviewed the patients home medicines and have made adjustments as needed   Test Considered:  ct   Critical Interventions:  Ivfs/levophed/abx   Consultations Obtained:  I requested consultation with the hospitalists (Dr. Alinda Money),  and discussed lab and imaging findings as well as pertinent plan - he will admit   Problem List / ED Course:  Urosepsis:  Code sepsis called:  pt given sepsis fluids.  IV abx given.  Levo started after BP dropped down to the 40s.  POA initially wanted levo; however, after additional  discussion, she decided to stop the levo and treat pain and anxiety.  Right now, she wants to continue ivfs, abx.  She wants to keep him dnr with limited interventions.   Reevaluation:  After the interventions noted above, I reevaluated the patient and found that they have :improved   Social Determinants of Health:  Lives in SNF   Dispostion:  After consideration of the diagnostic results and the patients response to treatment, I feel that the patent would benefit from admission.    CRITICAL CARE Performed by: Jacalyn Lefevre   Total critical care time: 60 minutes  Critical care time was exclusive of separately billable procedures and treating other patients.  Critical care was necessary to treat or prevent imminent or life-threatening deterioration.  Critical care was time spent personally by me on the following activities: development of treatment plan with patient and/or surrogate as well as nursing, discussions with consultants, evaluation of patient's response to treatment, examination of patient, obtaining history from patient or surrogate, ordering and performing treatments and interventions, ordering and review of laboratory studies, ordering and review of radiographic studies, pulse oximetry and re-evaluation of patient's condition.  Final Clinical Impression(s) / ED Diagnoses Final diagnoses:  Sepsis with encephalopathy and septic shock, due to unspecified organism [facility])  Urinary tract infection associated with indwelling urethral catheter, initial encounter (HCC)  Atrial fibrillation with RVR (HCC)  Acute metabolic encephalopathy    Rx / DC Orders ED Discharge Orders     None         Jacalyn Lefevre, MD 08/14/23 1538

## 2023-08-14 NOTE — ED Triage Notes (Signed)
Pt BIB [facility] from [facility], called out for [facility]. Facility noted wheezing so gave pt duoneb O2 sats 79% on room air, Ems reports clear lung sounds on arrival, placed pt on nonrebreather, O2 97%.   BP 102/68 HR 98 95%NRB  30 rr  CBG 121

## 2023-08-14 NOTE — Progress Notes (Signed)
Pharmacy Antibiotic Note  Barry [name] is a 77 y.o. male admitted on 08/14/2023 presenting with SOB, concern for sepsis.  Pharmacy has been consulted for vancomycin and cefepime dosing.  Vancomycin 1500 mg IV x 1 and cefepime 2g IV x 1 given in ED  Plan: Vancomycin 1g IV q 48h (eAUC 443) Cefepime 2g IV every 24h Monitor renal function, Cx and clinical progression to narrow Vancomycin levels as indicated     Temp (24hrs), Avg:100.1 F (37.8 C), Min:100.1 F (37.8 C), Max:100.1 F (37.8 C)  Recent Labs  Lab 08/14/23 1122 08/14/23 1135 08/14/23 1424  WBC 31.2*  --   --   CREATININE 1.81*  --   --   LATICACIDVEN  --  12.2* 7.6*    CrCl cannot be calculated (Unknown ideal weight.).    Allergies  Allergen Reactions   Piroxicam Itching and Rash    Daylene Posey, PharmD, [facility] Clinical Pharmacist ED Pharmacist Phone # [phone] 08/14/2023 3:44 PM

## 2023-08-15 ENCOUNTER — Encounter (HOSPITAL_COMMUNITY): Payer: Self-pay | Admitting: Internal Medicine

## 2023-08-15 DIAGNOSIS — G9341 Metabolic encephalopathy: Secondary | ICD-10-CM

## 2023-08-15 DIAGNOSIS — Z515 Encounter for palliative care: Secondary | ICD-10-CM | POA: Diagnosis not present

## 2023-08-15 DIAGNOSIS — A419 Sepsis, unspecified organism: Secondary | ICD-10-CM | POA: Diagnosis not present

## 2023-08-15 DIAGNOSIS — R6520 Severe sepsis without septic shock: Secondary | ICD-10-CM | POA: Diagnosis not present

## 2023-08-15 DIAGNOSIS — C678 Malignant neoplasm of overlapping sites of bladder: Secondary | ICD-10-CM | POA: Diagnosis not present

## 2023-08-15 DIAGNOSIS — Z66 Do not resuscitate: Secondary | ICD-10-CM

## 2023-08-15 LAB — BLOOD CULTURE ID PANEL (REFLEXED) - BCID2

## 2023-08-15 MED ORDER — BIOTENE DRY MOUTH MT LIQD
15.0000 mL | OROMUCOSAL | Status: DC
  Administered 2023-08-15 – 2023-08-18 (×6): 15 mL via OROMUCOSAL

## 2023-08-15 NOTE — Consult Note (Signed)
Consultation Note Date: 08/15/2023   Patient Name: Barry [name]  DOB: Sep 05, 1946  MRN: [id number]  Age / Sex: 77 y.o., male  PCP: Georgann Housekeeper, MD Referring Physician: Miguel Rota, MD  Reason for Consultation: Establishing goals of care  HPI/Patient Profile: 77 y.o. male  with past medical history of hypertension, hyperlipidemia, CAD status post CABG, GERD, atrial fibrillation, asthma, obesity, bladder cancer, and ulcerative colitis admitted on 08/14/2023 with AMS and shortness of breath.  Patient diagnosed with sepsis, presumed urinary source. Comfort care was elected. PMT consulted to support.  Clinical Assessment and Goals of Care: Chart reviewed extensively including MOST form, living will, and goals of care document completed earlier this month indicating patient wanted comfort measures and did not want to return to the hospital.   Upon entering room patient's friend/HCPOA Consuella Lose is at bedside meeting with Dr. Nelson Chimes and other friend Erskine Squibb present. Update provided by Dr Nelson Chimes.  Patient appears comfortable - breathing even and regular. Extremities warm to touch. No grimacing/furrowed brow. Morphine infusing at 5 mg/hr. Received dose of ativan earlier this AM.   Spoke with Consuella Lose and provided information on what to expect moving forward as patient transitions through end of life. Also discussed signs of discomfort and how to advocate for patient. She expresses understanding. She has my contact information and will call with any questions or concerns.  At this point, Consuella Lose requests we avoid any further transfers for the time being as she fears this is contributing to discomfort. She reports if patient remains stable in coming days she may consider transfer to [facility] but not at this time. We discuss a day to day evaluation of whether or not patient could tolerate transfer.   Primary Decision Maker HCPOA - Verner Mould    SUMMARY OF  RECOMMENDATIONS   - Continue comfort measures only - patient currently stable on morphine infusion, continue PRN ativan - At this point HCPOA not interested in transition to hospice facility, will consider in coming days if remains stable enough to transfer - add scheduled oral care  Code Status/Advance Care Planning: DNR  Discharge Planning: Anticipated Hospital Death      Primary Diagnoses: Present on Admission:  Benign essential HTN  Hyperlipidemia LDL goal <70  CAD (coronary artery disease), native coronary artery  GASTROESOPHAGEAL REFLUX DISEASE  Paroxysmal atrial fibrillation with RVR (HCC)  Class 2 obesity  Asthma, chronic  Malignant neoplasm of overlapping sites of bladder (HCC)  ULCERATIVE COLITIS  Severe sepsis (HCC)   I have reviewed the medical record, interviewed the patient and family, and examined the patient. The following aspects are pertinent.  Past Medical History:  Diagnosis Date   A-fib [facility])    Acute sinusitis 03/24/2012   Followed in Pulmonary clinic/ [facility]/ [name] - CT sinus 08/07/2013  Short air-fluid levels in the maxillary sinuses bilaterally suggesting early acute sinusitis. - repeat ct sinus 04/30/2014 > ok     Acute sinusitis, unspecified 08/14/2010   Qualifier: Diagnosis of  By: Clent Ridges NP, [name]     Anemia    Asthma    PFTs 06/15/05 FEV1 85% predicted ratio 68% and truncation of resp loop in a sawtooth pattern. HFA 25% 08-01-2009 >50% Nov 01, 2009>50% 01-31-2010   Asthma, chronic 11/24/2007   Followed in Pulmonary clinic/ [facility]/ [name]  - PFTs 06/15/2005 FEV1 85% predicted ratio 68% and truncation of respiratory loop in a sawtooth pattern  - HFA 25% August 01, 2009 > 50% November 01, 2009 > 50%  January 31, 2010 therefore changed to neb bud/brovana - HFA 75% p extensive coaching 08/07/2013  > insurance issues so try symbicort 160 2 bid instead of neb - 11/16/2015   p extensive   Bladder mass    BRONCHITIS, ACUTE 01/22/2011    Qualifier: Diagnosis of  By: Clent Ridges NP, [name]     CAD (coronary artery disease), native coronary artery 02/06/2019   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   Cataract    Chronic cough    Chronic rhinitis 05/02/2009   Followed in Pulmonary clinic/ [facility]/ [name]    - 03/24/12 CT Sinus > Negative paranasal sinuses - Sinus CT 08/07/2013 >>Short air-fluid levels in the maxillary sinuses bilaterally suggesting early acute sinusitis - Repeat augmentin x 21 days 03/27/2014 then sinus ct if not better  - flonase/afrin regimen 02/11/15 >>>      Coronary heart disease    COUGH, CHRONIC 11/24/2007   Followed in Pulmonary clinic/ [facility]/ [name]      DDD (degenerative disc disease), lumbar    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   Followed in Pulmonary clinic/ [facility]/ [name]  -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     Excessive ear wax, bilateral 09/15/2018   GASTROESOPHAGEAL REFLUX DISEASE 08/27/2008   Followed as Primary Care Patient/ GI/ Dr  Danise Edge     Glaucoma    Hyperlipidemia LDL goal <70 02/06/2019   Hypertension    Obesity    Osteoarthritis    Paralyzed hemidiaphragm    left, 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   Retina disorder 2014   Severe obesity (BMI >= 40) (HCC) 11/24/2007       - Target wt < 191 to get under BMI 30    Ulcerative colitis    ULCERATIVE COLITIS 11/24/2007   Followed as Primary Care Patient/ GI/ Dr  Danise Edge     URI 04/09/2008   Qualifier: Diagnosis of  By: Clent Ridges NP, [name]     Social History   Socioeconomic History   Marital status: Single    Spouse name: Not on file   Number of children: 0   Years of education: Not on file   Highest education level: Not on file  Occupational History   Occupation: disabled, prev worked in Designer, fashion/clothing  Tobacco Use   Smoking status: Never   Smokeless tobacco: Never  Substance and Sexual Activity   Alcohol use: No   Drug use: No   Sexual activity: Not on file  Other Topics  Concern   Not on file  Social History Narrative   ** Merged History Encounter **       Social Determinants of Health   Financial Resource Strain: Not on file  Food Insecurity: Patient Unable To Answer (08/14/2023)   Hunger Vital Sign    Worried About Running Out of Food in the Last Year: Patient unable to answer    Ran Out of Food in the Last Year: Patient unable to answer  Transportation Needs: Patient Unable To Answer (08/14/2023)   PRAPARE - Transportation    Lack of Transportation (Medical): Patient unable to answer    Lack of Transportation (Non-Medical): Patient unable to answer  Physical Activity: Not on file  Stress: Not on file  Social Connections: Not on file   Family History  Problem Relation Age of Onset   Heart disease Father    Hypertension Mother    Renal Disease Mother    Pulmonary Hypertension Mother    Other Sister  UNKNOWN HISTORY   Scheduled Meds:  cycloSPORINE  1 drop Both Eyes BID   latanoprost  1 drop Both Eyes QHS   pantoprazole  40 mg Oral Daily   Continuous Infusions:  morphine 5 mg/hr (08/14/23 1923)   PRN Meds:.acetaminophen **OR** acetaminophen, diphenhydrAMINE, haloperidol **OR** haloperidol **OR** haloperidol lactate, LORazepam **OR** LORazepam **OR** LORazepam, morphine, morphine CONCENTRATE **OR** morphine CONCENTRATE, ondansetron **OR** ondansetron (ZOFRAN) IV, polyethylene glycol, polyvinyl alcohol Allergies  Allergen Reactions   Piroxicam Itching and Rash   Review of Systems  Unable to perform ROS: Patient nonverbal    Physical Exam Constitutional:      General: He is not in acute distress.    Appearance: He is ill-appearing.     Comments: Does not respond to voice or touch  Pulmonary:     Effort: Pulmonary effort is normal.  Skin:    General: Skin is warm and dry.     Vital Signs: BP (!) 82/48 (BP Location: Right Arm)   Pulse (!) 113   Temp 99.3 F (37.4 C) (Axillary)   Resp 16   SpO2 97%  Pain Scale: 0-10    Pain Score: Asleep   SpO2: SpO2: 97 % O2 Device:SpO2: 97 % O2 Flow Rate: .O2 Flow Rate (L/min): 4 L/min  IO: Intake/output summary:  Intake/Output Summary (Last 24 hours) at 08/15/2023 1015 Last data filed at 08/14/2023 2030 Gross per 24 hour  Intake 413.89 ml  Output --  Net 413.89 ml    LBM:   Baseline Weight:   Most recent weight:       Palliative Assessment/Data: PPS 10%     *Please note that this is a verbal dictation therefore any spelling or grammatical errors are due to the "Dragon Medical One" system interpretation.   Gerlean Ren, DNP, AGNP-C Palliative Medicine Team [phone] Pager: [phone]

## 2023-08-15 NOTE — Progress Notes (Signed)
Transition of Care [facility]) - Inpatient Brief Assessment   Patient Details  Name: Barry [name] MRN: [id number] Date of Birth: [birth year]  [facility] CM/SW Contact:    Janae Bridgeman, RN Phone Number: 08/15/2023, 2:08 PM   Clinical Narrative: Patient is under comfort care orders.  Palliative Care Team is following the patient/family for support.  TOC will continue to follow the patient - no needs at this time.   Transition of Care Asessment: Insurance and Status: (P) Insurance coverage has been reviewed Patient has primary care physician: (P) Yes     Prior/Current Home Services: (P) No current home services Social Determinants of Health Reivew: (P) SDOH reviewed no interventions necessary Readmission risk has been reviewed: (P) Yes Transition of care needs: (P) no transition of care needs at this time

## 2023-08-15 NOTE — Progress Notes (Signed)
PROGRESS NOTE    Barry [name]  AVW:[id number] DOB: [birth year] DOA: 08/14/2023 PCP: Georgann Housekeeper, MD   Brief Narrative:  77 year old with history of HTN, HLD, CAD status post CABG, GERD, A-fib, asthma, obesity, bladder cancer, ulcerative colitis presented with altered mental status and shortness of breath.  Patient was recently admitted for urinary tract infection which grew E. coli and was discharged to nursing home with DNR status as patient was not a candidate for any aggressive treatments for his bladder cancer s/p resection in July with second stage being done on 8/6 by urology.  During this time patient has continued to decline.  Now he has been transition to comfort care.   Assessment & Plan:  Principal Problem:   Severe sepsis (HCC) Active Problems:   Asthma, chronic   GASTROESOPHAGEAL REFLUX DISEASE   ULCERATIVE COLITIS   CAD (coronary artery disease), native coronary artery   Benign essential HTN   Hyperlipidemia LDL goal <70   Hx of CABG   Malignant neoplasm of overlapping sites of bladder (HCC)   Class 2 obesity   Paroxysmal atrial fibrillation with RVR (HCC)   Severe sepsis secondary to urinary tract infection Bladder cancer status post recent multiple resection Essential hypertension CAD status post CABG Hyperlipidemia GERD Atrial fibrillation with RVR Asthma Hospice/comfort care patient  -Comfort care orders are in.  Patient is currently comfort care on morphine drip. Anticipate in hospital death but will consider hospice facility in a few days if needed.       Diet Orders (From admission, onward)     Start     Ordered   08/14/23 1542  Diet NPO time specified  Diet effective now        08/14/23 1542            Subjective:  Family at bedside No complaints Appears comfortable.   Examination:  General exam: Appears calm and comfortable ; appears chronically ill Respiratory system: Clear to auscultation. Respiratory effort  normal. Cardiovascular system: S1 & S2 heard, RRR. No JVD, murmurs, rubs, gallops or clicks. No pedal edema. Gastrointestinal system: Abdomen is nondistended, soft and nontender. No organomegaly or masses felt. Normal bowel sounds heard. Skin: No rashes, lesions or ulcers   Objective: Vitals:   08/14/23 1800 08/14/23 1820 08/14/23 1846 08/14/23 2010  BP: (!) 73/50  (!) 70/51 106/78  Pulse: (!) 132   (!) 102  Resp: 18  10 18   Temp:  98.6 F (37 C)  98.8 F (37.1 C)  TempSrc:  Axillary  Axillary  SpO2: 99%   99%    Intake/Output Summary (Last 24 hours) at 08/15/2023 0820 Last data filed at 08/14/2023 2030 Gross per 24 hour  Intake 413.89 ml  Output --  Net 413.89 ml   There were no vitals filed for this visit.  Scheduled Meds:  cycloSPORINE  1 drop Both Eyes BID   latanoprost  1 drop Both Eyes QHS   mometasone-formoterol  2 puff Inhalation BID   pantoprazole  40 mg Oral Daily   Continuous Infusions:  morphine 5 mg/hr (08/14/23 1923)    Nutritional status     There is no height or weight on file to calculate BMI.  Data Reviewed:   CBC: Recent Labs  Lab 08/14/23 1122  WBC 31.2*  NEUTROABS 30.6*  HGB 10.9*  HCT 37.4*  MCV 95.9  PLT 449*   Basic Metabolic Panel: Recent Labs  Lab 08/14/23 1122  NA 141  K 4.3  CL 110  CO2 11*  GLUCOSE 92  BUN 26*  CREATININE 1.81*  CALCIUM 9.0   GFR: CrCl cannot be calculated (Unknown ideal weight.). Liver Function Tests: Recent Labs  Lab 08/14/23 1122  AST 41  ALT 21  ALKPHOS 130*  BILITOT 1.2  PROT 6.1*  ALBUMIN 2.3*   No results for input(s): "LIPASE", "AMYLASE" in the last 168 hours. No results for input(s): "AMMONIA" in the last 168 hours. Coagulation Profile: Recent Labs  Lab 08/14/23 1122  INR 1.4*   Cardiac Enzymes: No results for input(s): "CKTOTAL", "CKMB", "CKMBINDEX", "TROPONINI" in the last 168 hours. BNP (last 3 results) No results for input(s): "PROBNP" in the last 8760  hours. HbA1C: No results for input(s): "HGBA1C" in the last 72 hours. CBG: No results for input(s): "GLUCAP" in the last 168 hours. Lipid Profile: No results for input(s): "CHOL", "HDL", "LDLCALC", "TRIG", "CHOLHDL", "LDLDIRECT" in the last 72 hours. Thyroid Function Tests: No results for input(s): "TSH", "T4TOTAL", "FREET4", "T3FREE", "THYROIDAB" in the last 72 hours. Anemia Panel: No results for input(s): "VITAMINB12", "FOLATE", "FERRITIN", "TIBC", "IRON", "RETICCTPCT" in the last 72 hours. Sepsis Labs: Recent Labs  Lab 08/14/23 1135 08/14/23 1424  LATICACIDVEN 12.2* 7.6*    Recent Results (from the past 240 hour(s))  Blood Culture (routine x 2)     Status: None (Preliminary result)   Collection Time: 08/14/23 11:22 AM   Specimen: BLOOD RIGHT FOREARM  Result Value Ref Range Status   Specimen Description BLOOD RIGHT FOREARM  Final   Special Requests   Final    BOTTLES DRAWN AEROBIC AND ANAEROBIC Blood Culture adequate volume   Culture  Setup Time   Final    GRAM NEGATIVE RODS IN BOTH AEROBIC AND ANAEROBIC BOTTLES Organism ID to follow CRITICAL RESULT CALLED TO, READ BACK BY AND VERIFIED WITH: J [facility]  08/15/23 MK Performed at [facility], [address] [place], [place] [address]    Culture GRAM NEGATIVE RODS  Final   Report Status PENDING  Incomplete  Blood Culture ID Panel (Reflexed)     Status: Abnormal   Collection Time: 08/14/23 11:22 AM  Result Value Ref Range Status   Enterococcus faecalis NOT DETECTED NOT DETECTED Final   Enterococcus Faecium NOT DETECTED NOT DETECTED Final   Listeria monocytogenes NOT DETECTED NOT DETECTED Final   Staphylococcus species NOT DETECTED NOT DETECTED Final   Staphylococcus aureus (BCID) NOT DETECTED NOT DETECTED Final   Staphylococcus epidermidis NOT DETECTED NOT DETECTED Final   Staphylococcus lugdunensis NOT DETECTED NOT DETECTED Final   Streptococcus species NOT DETECTED NOT DETECTED Final   Streptococcus  agalactiae NOT DETECTED NOT DETECTED Final   Streptococcus pneumoniae NOT DETECTED NOT DETECTED Final   Streptococcus pyogenes NOT DETECTED NOT DETECTED Final   A.calcoaceticus-baumannii NOT DETECTED NOT DETECTED Final   Bacteroides fragilis NOT DETECTED NOT DETECTED Final   Enterobacterales DETECTED (A) NOT DETECTED Final    Comment: Enterobacterales represent a large order of gram negative bacteria, not a single organism. CRITICAL RESULT CALLED TO, READ BACK BY AND VERIFIED WITH: J WYLAND,PHARMD@[id number]  08/15/23 MK    Enterobacter cloacae complex NOT DETECTED NOT DETECTED Final   Escherichia coli DETECTED (A) NOT DETECTED Final    Comment: CRITICAL RESULT CALLED TO, READ BACK BY AND VERIFIED WITH: J WYLAND,PHARMD@[id number]  08/15/23 MK    Klebsiella aerogenes NOT DETECTED NOT DETECTED Final   Klebsiella oxytoca NOT DETECTED NOT DETECTED Final   Klebsiella pneumoniae NOT DETECTED NOT DETECTED Final   Proteus species NOT DETECTED NOT DETECTED Final  Salmonella species NOT DETECTED NOT DETECTED Final   Serratia marcescens NOT DETECTED NOT DETECTED Final   Haemophilus influenzae NOT DETECTED NOT DETECTED Final   Neisseria meningitidis NOT DETECTED NOT DETECTED Final   Pseudomonas aeruginosa NOT DETECTED NOT DETECTED Final   Stenotrophomonas maltophilia NOT DETECTED NOT DETECTED Final   Candida albicans NOT DETECTED NOT DETECTED Final   Candida auris NOT DETECTED NOT DETECTED Final   Candida glabrata NOT DETECTED NOT DETECTED Final   Candida krusei NOT DETECTED NOT DETECTED Final   Candida parapsilosis NOT DETECTED NOT DETECTED Final   Candida tropicalis NOT DETECTED NOT DETECTED Final   Cryptococcus neoformans/gattii NOT DETECTED NOT DETECTED Final   CTX-M ESBL NOT DETECTED NOT DETECTED Final   Carbapenem resistance IMP NOT DETECTED NOT DETECTED Final   Carbapenem resistance KPC NOT DETECTED NOT DETECTED Final   Carbapenem resistance NDM NOT DETECTED NOT DETECTED Final   Carbapenem resist  OXA 48 LIKE NOT DETECTED NOT DETECTED Final   Carbapenem resistance VIM NOT DETECTED NOT DETECTED Final    Comment: Performed at [facility], [address], [place], [place] [address]  Resp panel by RT-PCR (RSV, Flu A&B, Covid) Anterior Nasal Swab     Status: None   Collection Time: 08/14/23  2:12 PM   Specimen: Anterior Nasal Swab  Result Value Ref Range Status   SARS Coronavirus 2 by RT PCR NEGATIVE NEGATIVE Final   Influenza A by PCR NEGATIVE NEGATIVE Final   Influenza B by PCR NEGATIVE NEGATIVE Final    Comment: (NOTE) The Xpert Xpress SARS-CoV-2/FLU/RSV plus assay is intended as an aid in the diagnosis of influenza from Nasopharyngeal swab specimens and should not be used as a sole basis for treatment. Nasal washings and aspirates are unacceptable for Xpert Xpress SARS-CoV-2/FLU/RSV testing.  Fact Sheet for Patients: BloggerCourse.com  Fact Sheet for Healthcare Providers: SeriousBroker.it  This test is not yet approved or cleared by the [place] FDA and has been authorized for detection and/or diagnosis of SARS-CoV-2 by FDA under an Emergency Use Authorization (EUA). This EUA will remain in effect (meaning this test can be used) for the duration of the COVID-19 declaration under Section 564(b)(1) of the Act, 21 U.S.C. section 360bbb-3(b)(1), unless the authorization is terminated or revoked.     Resp Syncytial Virus by PCR NEGATIVE NEGATIVE Final    Comment: (NOTE) Fact Sheet for Patients: BloggerCourse.com  Fact Sheet for Healthcare Providers: SeriousBroker.it  This test is not yet approved or cleared by the [place] FDA and has been authorized for detection and/or diagnosis of SARS-CoV-2 by FDA under an Emergency Use Authorization (EUA). This EUA will remain in effect (meaning this test can be used) for the duration of the COVID-19 declaration under  Section 564(b)(1) of the Act, 21 U.S.C. section 360bbb-3(b)(1), unless the authorization is terminated or revoked.  Performed at [facility], [address], [place], [place] [address]          Radiology Studies: DG Chest Port 1 View  Result Date: 08/14/2023 CLINICAL DATA:  Sepsis, coronary artery disease. EXAM: PORTABLE CHEST 1 VIEW COMPARISON:  July 21, 2023. FINDINGS: Status post coronary artery bypass graft. Elevated left hemidiaphragm is again noted. No acute pulmonary disease. Degenerative changes are seen involving both shoulders. IMPRESSION: No active disease. Electronically Signed   By: Lupita Raider M.D.   On: 08/14/2023 11:36           LOS: 1 day   Time spent= 35 mins  Miguel Rota, MD [facility]  If 7PM-7AM, please contact night-coverage  08/15/2023, 8:20 AM

## 2023-08-15 NOTE — Plan of Care (Signed)
  Problem: Fluid Volume: Goal: Hemodynamic stability will improve Outcome: Not Progressing   Problem: Clinical Measurements: Goal: Diagnostic test results will improve Outcome: Not Progressing Goal: Signs and symptoms of infection will decrease Outcome: Not Progressing   Problem: Respiratory: Goal: Ability to maintain adequate ventilation will improve Outcome: Not Progressing   Problem: Education: Goal: Knowledge of the prescribed therapeutic regimen will improve Outcome: Not Progressing   Problem: Coping: Goal: Ability to identify and develop effective coping behavior will improve Outcome: Not Progressing   Problem: Clinical Measurements: Goal: Quality of life will improve Outcome: Not Progressing   Problem: Respiratory: Goal: Verbalizations of increased ease of respirations will increase Outcome: Not Progressing   Problem: Role Relationship: Goal: Family's ability to cope with current situation will improve Outcome: Not Progressing Goal: Ability to verbalize concerns, feelings, and thoughts to partner or family member will improve Outcome: Not Progressing   Problem: Pain Management: Goal: Satisfaction with pain management regimen will improve Outcome: Not Progressing

## 2023-08-15 NOTE — Progress Notes (Signed)
PHARMACY - PHYSICIAN COMMUNICATION CRITICAL VALUE ALERT - BLOOD CULTURE IDENTIFICATION (BCID)  Barry [name] is an 77 y.o. male who presented to [facility] on 08/14/2023 with a chief complaint of SOB/ altered mental status who was treated for sepsis with ceftriaxone and vancomycin in the ED. Patient had previous history of E. Coli UTI and was admitted on 07/21/2023. Patient had E. Coli in 1/2 Bcx with no R growing. Patient is now comfort care.  Name of physician (or Provider) Contacted: Dr. Julian Reil  Current antibiotics: None, was transitioned to comfort care on 08/14/2023  Results for orders placed or performed during the hospital encounter of 08/14/23  Blood Culture ID Panel (Reflexed) (Collected: 08/14/2023 11:22 AM)  Result Value Ref Range   Enterococcus faecalis NOT DETECTED NOT DETECTED   Enterococcus Faecium NOT DETECTED NOT DETECTED   Listeria monocytogenes NOT DETECTED NOT DETECTED   Staphylococcus species NOT DETECTED NOT DETECTED   Staphylococcus aureus (BCID) NOT DETECTED NOT DETECTED   Staphylococcus epidermidis NOT DETECTED NOT DETECTED   Staphylococcus lugdunensis NOT DETECTED NOT DETECTED   Streptococcus species NOT DETECTED NOT DETECTED   Streptococcus agalactiae NOT DETECTED NOT DETECTED   Streptococcus pneumoniae NOT DETECTED NOT DETECTED   Streptococcus pyogenes NOT DETECTED NOT DETECTED   A.calcoaceticus-baumannii NOT DETECTED NOT DETECTED   Bacteroides fragilis NOT DETECTED NOT DETECTED   Enterobacterales DETECTED (A) NOT DETECTED   Enterobacter cloacae complex NOT DETECTED NOT DETECTED   Escherichia coli DETECTED (A) NOT DETECTED   Klebsiella aerogenes NOT DETECTED NOT DETECTED   Klebsiella oxytoca NOT DETECTED NOT DETECTED   Klebsiella pneumoniae NOT DETECTED NOT DETECTED   Proteus species NOT DETECTED NOT DETECTED   Salmonella species NOT DETECTED NOT DETECTED   Serratia marcescens NOT DETECTED NOT DETECTED   Haemophilus influenzae NOT DETECTED NOT DETECTED    Neisseria meningitidis NOT DETECTED NOT DETECTED   Pseudomonas aeruginosa NOT DETECTED NOT DETECTED   Stenotrophomonas maltophilia NOT DETECTED NOT DETECTED   Candida albicans NOT DETECTED NOT DETECTED   Candida auris NOT DETECTED NOT DETECTED   Candida glabrata NOT DETECTED NOT DETECTED   Candida krusei NOT DETECTED NOT DETECTED   Candida parapsilosis NOT DETECTED NOT DETECTED   Candida tropicalis NOT DETECTED NOT DETECTED   Cryptococcus neoformans/gattii NOT DETECTED NOT DETECTED   CTX-M ESBL NOT DETECTED NOT DETECTED   Carbapenem resistance IMP NOT DETECTED NOT DETECTED   Carbapenem resistance KPC NOT DETECTED NOT DETECTED   Carbapenem resistance NDM NOT DETECTED NOT DETECTED   Carbapenem resist OXA 48 LIKE NOT DETECTED NOT DETECTED   Carbapenem resistance VIM NOT DETECTED NOT DETECTED   Arabella Merles, PharmD. Clinical Pharmacist 08/15/2023 2:32 AM

## 2023-08-16 DIAGNOSIS — Z515 Encounter for palliative care: Secondary | ICD-10-CM | POA: Diagnosis not present

## 2023-08-16 DIAGNOSIS — R6520 Severe sepsis without septic shock: Secondary | ICD-10-CM | POA: Diagnosis not present

## 2023-08-16 DIAGNOSIS — C678 Malignant neoplasm of overlapping sites of bladder: Secondary | ICD-10-CM | POA: Diagnosis not present

## 2023-08-16 DIAGNOSIS — A419 Sepsis, unspecified organism: Secondary | ICD-10-CM | POA: Diagnosis not present

## 2023-08-16 DIAGNOSIS — Z7189 Other specified counseling: Secondary | ICD-10-CM

## 2023-08-16 MED ORDER — MORPHINE BOLUS VIA INFUSION
5.0000 mg | INTRAVENOUS | Status: DC | PRN
  Administered 2023-08-16: 5 mg via INTRAVENOUS

## 2023-08-16 NOTE — TOC Progression Note (Signed)
Transition of Care [facility]) - Progression Note    Patient Details  Name: Barry [name] MRN: [id number] Date of Birth: 09-14-1946  [facility] CM/SW Contact  Janae Bridgeman, RN Phone Number: 08/16/2023, 11:53 AM  Clinical Narrative:    CM spoke with Danella Deis, Palliative Care NP this afternoon and the patient's HCPOA is at the bedside.  I spoke with Rockey Situ and she plans to call [facility] SNF today or tomorrow herself once the patient passes away.  The patient's POA states that she is handling the patient's finances and that she would like time to move the patient's belongings out of the SNF and his apartment that he currently leases.  The POA was provided with the contact number to [facility]  No other TOC needs at this time.  The patient was unconscious and did not arouse to touch.  Morphine drip remains  and family is requesting hospital death at this time since the patient would likely not survive transfer to an inpatient hospice facility.       Expected Discharge Plan and Services                                               Social Determinants of Health (SDOH) Interventions SDOH Screenings   Food Insecurity: Patient Unable To Answer (08/13/2023)  Housing: High Risk (07/22/2023)  Transportation Needs: Patient Unable To Answer (08/13/2023)  Utilities: Patient Unable To Answer (07/23/2023)  Tobacco Use: Low Risk  (07/23/2023)    Readmission Risk Interventions    08/15/2023    2:08 PM 07/22/2023    1:05 PM  Readmission Risk Prevention Plan  Transportation Screening Complete Complete  PCP or Specialist Appt within 5-7 Days  Complete  Home Care Screening  Complete  Medication Review (RN CM)  Complete  Medication Review (RN Care Manager) Complete   PCP or Specialist appointment within 3-5 days of discharge Complete   HRI or Home Care Consult Complete   SW Recovery Care/Counseling Consult Complete   Palliative Care Screening Complete   Skilled  Nursing Facility Not Applicable

## 2023-08-16 NOTE — Hospital Course (Signed)
  Brief Narrative:  77 year old with history of HTN, HLD, CAD status post CABG, GERD, A-fib, asthma, obesity, bladder cancer, ulcerative colitis presented with altered mental status and shortness of breath.  Patient was recently admitted for urinary tract infection which grew E. coli and was discharged to nursing home with DNR status as patient was not a candidate for any aggressive treatments for his bladder cancer s/p resection in July with second stage being done on 8/6 by urology.  During this time patient has continued to decline.  Now he has been transition to comfort care.     Assessment & Plan:  Principal Problem:   Severe sepsis (HCC) Active Problems:   Asthma, chronic   GASTROESOPHAGEAL REFLUX DISEASE   ULCERATIVE COLITIS   CAD (coronary artery disease), native coronary artery   Benign essential HTN   Hyperlipidemia LDL goal <70   Hx of CABG   Malignant neoplasm of overlapping sites of bladder (HCC)   Class 2 obesity   Paroxysmal atrial fibrillation with RVR (HCC)   Severe sepsis secondary to urinary tract infection Bladder cancer status post recent multiple resection Essential hypertension CAD status post CABG Hyperlipidemia GERD Atrial fibrillation with RVR Asthma Hospice/comfort care patient   -Comfort care orders are in.   Patient is currently comfort care on morphine drip. Anticipate in hospital death but will consider hospice facility in a few days if needed.

## 2023-08-16 NOTE — Progress Notes (Signed)
Daily Progress Note   Patient Name: Barry [name]       Date: 08/16/2023 DOB: [birth year]  Age: 77 y.o. MRN#: [id number] Attending Physician: Miguel Rota, MD Primary Care Physician: Georgann Housekeeper, MD Admit Date: 08/03/2023  Reason for Consultation/Follow-up: Establishing goals of care  Subjective: No family at bedside, patient unresponsive  Length of Stay: 2  Current Medications: Scheduled Meds:   antiseptic oral rinse  15 mL Mouth Rinse Q4H   cycloSPORINE  1 drop Both Eyes BID   latanoprost  1 drop Both Eyes QHS   pantoprazole  40 mg Oral Daily    Continuous Infusions:  morphine 5 mg/hr (08/16/23 0750)    PRN Meds: acetaminophen **OR** acetaminophen, diphenhydrAMINE, haloperidol **OR** haloperidol **OR** haloperidol lactate, LORazepam **OR** LORazepam **OR** LORazepam, morphine, morphine CONCENTRATE **OR** morphine CONCENTRATE, ondansetron **OR** ondansetron (ZOFRAN) IV, polyethylene glycol, polyvinyl alcohol  Physical Exam          Vital Signs: BP 131/73 (BP Location: Right Arm)   Pulse (!) 156   Temp (!) 103.1 F (39.5 C) (Oral)   Resp (!) 26   SpO2 95%  SpO2: SpO2: 95 % O2 Device: O2 Device: Room Air O2 Flow Rate: O2 Flow Rate (L/min): 4 L/min  Intake/output summary:  Intake/Output Summary (Last 24 hours) at 08/16/2023 0838 Last data filed at 08/15/2023 1859 Gross per 24 hour  Intake --  Output 780 ml  Net -780 ml   LBM: Last BM Date : 08/16/23 Baseline Weight:   Most recent weight:         Palliative Assessment/Data:PPS 10%      Patient Active Problem List   Diagnosis Date Noted   Severe sepsis (HCC) 08/02/2023   Palliative care encounter 07/28/2023   Lower urinary tract infectious disease 07/28/2023   UTI (urinary tract infection) 07/21/2023   Malignant  neoplasm of overlapping sites of bladder (HCC) 06/28/2023   Gross hematuria 06/28/2023   Class 2 obesity 06/28/2023   ABLA (acute blood loss anemia) 06/28/2023   Paroxysmal atrial fibrillation with RVR (HCC) 06/28/2023   Physical deconditioning 06/05/2023   Effusion of left knee 06/05/2023   Preoperative cardiovascular examination 06/04/2023   Hx of CABG 06/04/2023   Bladder mass 06/03/2023   Pressure injury of skin 06/03/2023   Lower GI bleed 06/03/2023   PAF (paroxysmal atrial fibrillation) (HCC) 01/11/2023   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 (HCC) 01/11/2023   Seborrheic dermatitis 09/02/2020   CAD (coronary artery disease), native coronary artery 02/06/2019   Benign essential HTN 02/06/2019   Hyperlipidemia LDL goal <70 02/06/2019   Excessive ear wax, bilateral 09/15/2018   Acute sinusitis 03/24/2012   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 male  with past medical history of hypertension, hyperlipidemia, CAD status post CABG, GERD, atrial fibrillation, asthma, obesity, bladder cancer, and ulcerative colitis admitted on 08/12/2023 with AMS and shortness of breath.  Patient diagnosed with sepsis, presumed urinary source. Comfort care was elected. PMT consulted to  support.   Assessment: Chart reviewed. No prns utilized - continues on morphine infusion at 5 mg/hr. Vitals reviewed - HR increased, RR sl elevated, BP higher than yesterday, temp up to 103.  Patient does not respond to voice or touch. No friends/family at bedside this morning.   Recommendations/Plan: Decrease oxygen to 2L as oxygen sats are okay and increased oxygen does not contribute to goal of maintaining comfort, use medications to promote comfort Continue  morphine infusion - will discuss bolus with RN given increased RR and HR Continue oral care   Goals of Care and Additional Recommendations: Limitations on Scope of Treatment: Full Comfort Care  Code Status: DNR  Prognosis:  Hours - Days  Discharge Planning: Anticipated Hospital Death  Care plan was discussed with RN  Thank you for allowing the Palliative Medicine Team to assist in the care of this patient.  *Please note that this is a verbal dictation therefore any spelling or grammatical errors are due to the "Dragon Medical One" system interpretation.  Gerlean Ren, DNP, [employer] [facility] Palliative Medicine Team Team Phone # [phone]  Pager [phone]

## 2023-08-16 NOTE — Progress Notes (Signed)
PROGRESS NOTE    Barry [name]  ZOX:[id number] DOB: 10/03/1946 DOA: 08/15/2023 PCP: Georgann Housekeeper, MD     Brief Narrative:  77 year old with history of HTN, HLD, CAD status post CABG, GERD, A-fib, asthma, obesity, bladder cancer, ulcerative colitis presented with altered mental status and shortness of breath.  Patient was recently admitted for urinary tract infection which grew E. coli and was discharged to nursing home with DNR status as patient was not a candidate for any aggressive treatments for his bladder cancer s/p resection in July with second stage being done on 8/6 by urology.  During this time patient has continued to decline.  Now he has been transition to comfort care.     Assessment & Plan:  Principal Problem:   Severe sepsis (HCC) Active Problems:   Asthma, chronic   GASTROESOPHAGEAL REFLUX DISEASE   ULCERATIVE COLITIS   CAD (coronary artery disease), native coronary artery   Benign essential HTN   Hyperlipidemia LDL goal <70   Hx of CABG   Malignant neoplasm of overlapping sites of bladder (HCC)   Class 2 obesity   Paroxysmal atrial fibrillation with RVR (HCC)   Severe sepsis secondary to urinary tract infection Bladder cancer status post recent multiple resection Essential hypertension CAD status post CABG Hyperlipidemia GERD Atrial fibrillation with RVR Asthma Hospice/comfort care patient   -Comfort care orders are in.   Patient is currently comfort care on morphine drip. Anticipate in hospital death but will consider hospice facility in a few days if needed.              Pressure Injury 06/03/23 Buttocks Bilateral Stage 2 -  Partial thickness loss of dermis presenting as a shallow open injury with a red, pink wound bed without slough. (Active)  06/03/23 1740  Location: Buttocks  Location Orientation: Bilateral  Staging: Stage 2 -  Partial thickness loss of dermis presenting as a shallow open injury with a red, pink wound bed without slough.  (MASD around wound)  Wound Description (Comments):   Present on Admission: Yes     Diet Orders (From admission, onward)     Start     Ordered   07/20/2023 1542  Diet NPO time specified  Diet effective now        08/11/2023 1542            Subjective:  Remains on morphine drip.  Minimally responsive.  Examination:  General exam: Appears calm and comfortable ; appears chronically ill Respiratory system: Clear to auscultation. Respiratory effort normal. Cardiovascular system: S1 & S2 heard, RRR. No JVD, murmurs, rubs, gallops or clicks. No pedal edema. Gastrointestinal system: Abdomen is nondistended, soft and nontender. No organomegaly or masses felt. Normal bowel sounds heard. Skin: No rashes, lesions or ulcers  Objective: Vitals:   08/15/23 0904 08/15/23 0913 08/16/23 0815 08/16/23 1026  BP:  (!) 82/48 131/73   Pulse:  (!) 113 (!) 156   Resp: 16 16 (!) 26 20  Temp:  99.3 F (37.4 C) (!) 103.1 F (39.5 C)   TempSrc:  Axillary Oral   SpO2:  97% 95%     Intake/Output Summary (Last 24 hours) at 08/16/2023 1159 Last data filed at 08/15/2023 1859 Gross per 24 hour  Intake --  Output 780 ml  Net -780 ml   There were no vitals filed for this visit.  Scheduled Meds:  antiseptic oral rinse  15 mL Mouth Rinse Q4H   cycloSPORINE  1 drop Both Eyes BID  latanoprost  1 drop Both Eyes QHS   pantoprazole  40 mg Oral Daily   Continuous Infusions:  morphine 5 mg/hr (08/16/23 0750)    Nutritional status     There is no height or weight on file to calculate BMI.  Data Reviewed:   CBC: Recent Labs  Lab 08/05/2023 1122  WBC 31.2*  NEUTROABS 30.6*  HGB 10.9*  HCT 37.4*  MCV 95.9  PLT 449*   Basic Metabolic Panel: Recent Labs  Lab 08/06/2023 1122  NA 141  K 4.3  CL 110  CO2 11*  GLUCOSE 92  BUN 26*  CREATININE 1.81*  CALCIUM 9.0   GFR: CrCl cannot be calculated (Unknown ideal weight.). Liver Function Tests: Recent Labs  Lab 07/23/2023 1122  AST 41  ALT  21  ALKPHOS 130*  BILITOT 1.2  PROT 6.1*  ALBUMIN 2.3*   No results for input(s): "LIPASE", "AMYLASE" in the last 168 hours. No results for input(s): "AMMONIA" in the last 168 hours. Coagulation Profile: Recent Labs  Lab 08/17/2023 1122  INR 1.4*   Cardiac Enzymes: No results for input(s): "CKTOTAL", "CKMB", "CKMBINDEX", "TROPONINI" in the last 168 hours. BNP (last 3 results) No results for input(s): "PROBNP" in the last 8760 hours. HbA1C: No results for input(s): "HGBA1C" in the last 72 hours. CBG: No results for input(s): "GLUCAP" in the last 168 hours. Lipid Profile: No results for input(s): "CHOL", "HDL", "LDLCALC", "TRIG", "CHOLHDL", "LDLDIRECT" in the last 72 hours. Thyroid Function Tests: No results for input(s): "TSH", "T4TOTAL", "FREET4", "T3FREE", "THYROIDAB" in the last 72 hours. Anemia Panel: No results for input(s): "VITAMINB12", "FOLATE", "FERRITIN", "TIBC", "IRON", "RETICCTPCT" in the last 72 hours. Sepsis Labs: Recent Labs  Lab 07/23/2023 1135 07/25/2023 1424  LATICACIDVEN 12.2* 7.6*    Recent Results (from the past 240 hour(s))  Urine Culture     Status: None (Preliminary result)   Collection Time: 08/04/2023 10:57 AM   Specimen: Urine, Clean Catch  Result Value Ref Range Status   Specimen Description URINE, CLEAN CATCH  Final   Special Requests   Final    NONE Reflexed from [id number] Performed at [facility], [address] [place], [place] [address]    Culture PENDING  Incomplete   Report Status PENDING  Incomplete  Blood Culture (routine x 2)     Status: Abnormal (Preliminary result)   Collection Time: 08/17/2023 11:22 AM   Specimen: BLOOD RIGHT FOREARM  Result Value Ref Range Status   Specimen Description BLOOD RIGHT FOREARM  Final   Special Requests   Final    BOTTLES DRAWN AEROBIC AND ANAEROBIC Blood Culture adequate volume   Culture  Setup Time   Final    GRAM NEGATIVE RODS IN BOTH AEROBIC AND ANAEROBIC BOTTLES Organism ID to  follow CRITICAL RESULT CALLED TO, READ BACK BY AND VERIFIED WITH: J WYLAND,[email or web address]@[id number]  08/15/23 MK    Culture (A)  Final    ESCHERICHIA COLI SUSCEPTIBILITIES TO FOLLOW Performed at [facility], [address] [place], [place] [address]    Report Status PENDING  Incomplete  Blood Culture ID Panel (Reflexed)     Status: Abnormal   Collection Time: 07/27/2023 11:22 AM  Result Value Ref Range Status   Enterococcus faecalis NOT DETECTED NOT DETECTED Final   Enterococcus Faecium NOT DETECTED NOT DETECTED Final   Listeria monocytogenes NOT DETECTED NOT DETECTED Final   Staphylococcus species NOT DETECTED NOT DETECTED Final   Staphylococcus aureus (BCID) NOT DETECTED NOT DETECTED Final  Staphylococcus epidermidis NOT DETECTED NOT DETECTED Final   Staphylococcus lugdunensis NOT DETECTED NOT DETECTED Final   Streptococcus species NOT DETECTED NOT DETECTED Final   Streptococcus agalactiae NOT DETECTED NOT DETECTED Final   Streptococcus pneumoniae NOT DETECTED NOT DETECTED Final   Streptococcus pyogenes NOT DETECTED NOT DETECTED Final   A.calcoaceticus-baumannii NOT DETECTED NOT DETECTED Final   Bacteroides fragilis NOT DETECTED NOT DETECTED Final   Enterobacterales DETECTED (A) NOT DETECTED Final    Comment: Enterobacterales represent a large order of gram negative bacteria, not a single organism. CRITICAL RESULT CALLED TO, READ BACK BY AND VERIFIED WITH: J WYLAND,[email or web address]@[email or web address]  08/15/23 MK    Enterobacter cloacae complex NOT DETECTED NOT DETECTED Final   Escherichia coli DETECTED (A) NOT DETECTED Final    Comment: CRITICAL RESULT CALLED TO, READ BACK BY AND VERIFIED WITH: J WYLAND,[email or web address]@[email or web address]  08/15/23 MK    Klebsiella aerogenes NOT DETECTED NOT DETECTED Final   Klebsiella oxytoca NOT DETECTED NOT DETECTED Final   Klebsiella pneumoniae NOT DETECTED NOT DETECTED Final   Proteus species NOT DETECTED NOT DETECTED Final   Salmonella species NOT DETECTED NOT DETECTED Final   Serratia  marcescens NOT DETECTED NOT DETECTED Final   Haemophilus influenzae NOT DETECTED NOT DETECTED Final   Neisseria meningitidis NOT DETECTED NOT DETECTED Final   Pseudomonas aeruginosa NOT DETECTED NOT DETECTED Final   Stenotrophomonas maltophilia NOT DETECTED NOT DETECTED Final   Candida albicans NOT DETECTED NOT DETECTED Final   Candida auris NOT DETECTED NOT DETECTED Final   Candida glabrata NOT DETECTED NOT DETECTED Final   Candida krusei NOT DETECTED NOT DETECTED Final   Candida parapsilosis NOT DETECTED NOT DETECTED Final   Candida tropicalis NOT DETECTED NOT DETECTED Final   Cryptococcus neoformans/gattii NOT DETECTED NOT DETECTED Final   CTX-M ESBL NOT DETECTED NOT DETECTED Final   Carbapenem resistance IMP NOT DETECTED NOT DETECTED Final   Carbapenem resistance KPC NOT DETECTED NOT DETECTED Final   Carbapenem resistance NDM NOT DETECTED NOT DETECTED Final   Carbapenem resist OXA 48 LIKE NOT DETECTED NOT DETECTED Final   Carbapenem resistance VIM NOT DETECTED NOT DETECTED Final    Comment: Performed at [facility], [address], [place], [place] [address]  Resp panel by RT-PCR (RSV, Flu A&B, Covid) Anterior Nasal Swab     Status: None   Collection Time: 08/16/2023  2:12 PM   Specimen: Anterior Nasal Swab  Result Value Ref Range Status   SARS Coronavirus 2 by RT PCR NEGATIVE NEGATIVE Final   Influenza A by PCR NEGATIVE NEGATIVE Final   Influenza B by PCR NEGATIVE NEGATIVE Final    Comment: (NOTE) The Xpert Xpress SARS-CoV-2/FLU/RSV plus assay is intended as an aid in the diagnosis of influenza from Nasopharyngeal swab specimens and should not be used as a sole basis for treatment. Nasal washings and aspirates are unacceptable for Xpert Xpress SARS-CoV-2/FLU/RSV testing.  Fact Sheet for Patients: BloggerCourse.com  Fact Sheet for Healthcare Providers: SeriousBroker.it  This test is not yet approved or cleared by the  [place] FDA and has been authorized for detection and/or diagnosis of SARS-CoV-2 by FDA under an Emergency Use Authorization (EUA). This EUA will remain in effect (meaning this test can be used) for the duration of the COVID-19 declaration under Section 564(b)(1) of the Act, 21 U.S.C. section 360bbb-3(b)(1), unless the authorization is terminated or revoked.     Resp Syncytial Virus by PCR NEGATIVE NEGATIVE Final    Comment: (NOTE) Fact Sheet for Patients: BloggerCourse.com  Fact Sheet  for Healthcare Providers: SeriousBroker.it  This test is not yet approved or cleared by the [place] and has been authorized for detection and/or diagnosis of SARS-CoV-2 by FDA under an Emergency Use Authorization (EUA). This EUA will remain in effect (meaning this test can be used) for the duration of the COVID-19 declaration under Section 564(b)(1) of the Act, 21 U.S.C. section 360bbb-3(b)(1), unless the authorization is terminated or revoked.  Performed at [facility], [address] [place], [place] [address]          Radiology Studies: No results found.         LOS: 2 days   Time spent= 35 mins    Miguel Rota, MD [facility]  If 7PM-7AM, please contact night-coverage  08/16/2023, 11:59 AM

## 2023-08-17 DIAGNOSIS — Z515 Encounter for palliative care: Secondary | ICD-10-CM | POA: Diagnosis not present

## 2023-08-17 DIAGNOSIS — R6520 Severe sepsis without septic shock: Secondary | ICD-10-CM | POA: Diagnosis not present

## 2023-08-17 DIAGNOSIS — A419 Sepsis, unspecified organism: Secondary | ICD-10-CM | POA: Diagnosis not present

## 2023-08-17 DIAGNOSIS — G9341 Metabolic encephalopathy: Secondary | ICD-10-CM | POA: Diagnosis not present

## 2023-08-17 LAB — CULTURE, BLOOD (ROUTINE X 2): Special Requests: ADEQUATE

## 2023-08-17 NOTE — Progress Notes (Signed)
Palliative Medicine Progress Note   Patient Name: Barry [name]       Date: 08/17/2023 DOB: [birth year]  Age: 77 y.o. MRN#: [id number] Attending Physician: Miguel Rota, MD Primary Care Physician: Georgann Housekeeper, MD Admit Date: 08/13/2023  Reason for Consultation/Follow-up: end of life care  HPI/Patient Profile: 77 y.o. male with past medical history of hypertension, hyperlipidemia, CAD status post CABG, GERD, atrial fibrillation, asthma, obesity, bladder cancer, and ulcerative colitis admitted on 08/01/2023 with AMS and shortness of breath. Patient diagnosed with sepsis, presumed urinary source. Comfort care was elected. PMT consulted to support.   Subjective: Patient appears comfortable.  Morphine infusion is at 5 mg/hr. He is unresponsive to voice and light touch. No non-verbal signs of pain or discomfort noted. Respirations are even and unlabored. No excessive respiratory secretions noted. HCPOA/[name] present at bedside. Reviewed natural trajectory at EOL. Emotional support provided.    Objective:  Physical Exam          Vital Signs: BP 112/68 (BP Location: Right Arm)   Pulse (!) 130   Temp 98.3 F (36.8 C) (Oral)   Resp 16   SpO2 93%  SpO2: SpO2: 93 % O2 Device: O2 Device: Room Air O2 Flow Rate: O2 Flow Rate (L/min): 4 L/min    Palliative Medicine Assessment & Plan   Assessment: Principal Problem:   Severe sepsis (HCC) Active Problems:   Asthma, chronic   GASTROESOPHAGEAL REFLUX DISEASE   ULCERATIVE COLITIS   CAD (coronary artery disease), native coronary artery   Benign essential HTN   Hyperlipidemia LDL goal <70   Hx of CABG   Malignant neoplasm of overlapping sites of bladder (HCC)   Class 2 obesity   Paroxysmal atrial fibrillation with RVR (HCC)     Recommendations/Plan: Continue comfort measures Continue morphine infusion - please bolus for increased work of breathing or RR > 25 PRN medications are available for symptom management at EOL PMT will continue to follow  Goals of Care and Additional Recommendations: Limitations on Scope of Treatment: {Recommended Scope and Preferences:21019}  Code Status: DNR   Prognosis:  Hours - Days  Discharge Planning: Anticipated Hospital Death  Care plan was discussed with ***  Thank you for allowing the Palliative Medicine Team to assist in the care of this patient.   ***   Merry Proud,  NP   Please contact Palliative Medicine Team phone at [phone] for questions and concerns.  For individual providers, please see AMION.

## 2023-08-17 NOTE — Progress Notes (Signed)
PROGRESS NOTE    Barry [name]  UEA:[id number] DOB: 04-17-1946 DOA: 08/01/2023 PCP: Georgann Housekeeper, MD     Brief Narrative:  77 year old with history of HTN, HLD, CAD status post CABG, GERD, A-fib, asthma, obesity, bladder cancer, ulcerative colitis presented with altered mental status and shortness of breath.  Patient was recently admitted for urinary tract infection which grew E. coli and was discharged to nursing home with DNR status as patient was not a candidate for any aggressive treatments for his bladder cancer s/p resection in July with second stage being done on 8/6 by urology.  During this time patient has continued to decline.  Now he has been transition to comfort care.     Assessment & Plan:  Principal Problem:   Severe sepsis (HCC) Active Problems:   Asthma, chronic   GASTROESOPHAGEAL REFLUX DISEASE   ULCERATIVE COLITIS   CAD (coronary artery disease), native coronary artery   Benign essential HTN   Hyperlipidemia LDL goal <70   Hx of CABG   Malignant neoplasm of overlapping sites of bladder (HCC)   Class 2 obesity   Paroxysmal atrial fibrillation with RVR (HCC)   Severe sepsis secondary to urinary tract infection Bladder cancer status post recent multiple resection Essential hypertension CAD status post CABG Hyperlipidemia GERD Atrial fibrillation with RVR Asthma Hospice/comfort care patient   -Comfort care orders are in.   Patient is currently comfort care on morphine drip. Anticipate in hospital death but will consider hospice facility in a few days if needed.         Subjective:  Remains on morphine drip.    Examination: Appears chronically ill.  On morphine drip.  Bilateral diffuse rhonchi.  Laying with his eyes open   Pressure Injury 06/03/23 Buttocks Bilateral Stage 2 -  Partial thickness loss of dermis presenting as a shallow open injury with a red, pink wound bed without slough. (Active)  06/03/23 1740  Location: Buttocks  Location  Orientation: Bilateral  Staging: Stage 2 -  Partial thickness loss of dermis presenting as a shallow open injury with a red, pink wound bed without slough. (MASD around wound)  Wound Description (Comments):   Present on Admission: Yes     Diet Orders (From admission, onward)     Start     Ordered   08/01/2023 1542  Diet NPO time specified  Diet effective now        07/18/2023 1542            Objective: Vitals:   08/16/23 1800 08/16/23 1941 08/17/23 0417 08/17/23 0722  BP:  115/69 (!) 104/56 112/68  Pulse:  (!) 177 (!) 151 (!) 130  Resp: 20 20 20 16   Temp:  (!) 100.8 F (38.2 C) 99.6 F (37.6 C) 98.3 F (36.8 C)  TempSrc:    Oral  SpO2:  93% 94% 93%    Intake/Output Summary (Last 24 hours) at 08/17/2023 1100 Last data filed at 08/17/2023 4782 Gross per 24 hour  Intake --  Output 950 ml  Net -950 ml   There were no vitals filed for this visit.  Scheduled Meds:  antiseptic oral rinse  15 mL Mouth Rinse Q4H   cycloSPORINE  1 drop Both Eyes BID   latanoprost  1 drop Both Eyes QHS   pantoprazole  40 mg Oral Daily   Continuous Infusions:  morphine 5 mg/hr (08/17/23 0320)    Nutritional status     There is no height or weight on file to calculate  BMI.  Data Reviewed:   CBC: Recent Labs  Lab 08/13/2023 1122  WBC 31.2*  NEUTROABS 30.6*  HGB 10.9*  HCT 37.4*  MCV 95.9  PLT 449*   Basic Metabolic Panel: Recent Labs  Lab 07/26/2023 1122  NA 141  K 4.3  CL 110  CO2 11*  GLUCOSE 92  BUN 26*  CREATININE 1.81*  CALCIUM 9.0   GFR: CrCl cannot be calculated (Unknown ideal weight.). Liver Function Tests: Recent Labs  Lab 07/29/2023 1122  AST 41  ALT 21  ALKPHOS 130*  BILITOT 1.2  PROT 6.1*  ALBUMIN 2.3*   No results for input(s): "LIPASE", "AMYLASE" in the last 168 hours. No results for input(s): "AMMONIA" in the last 168 hours. Coagulation Profile: Recent Labs  Lab 07/22/2023 1122  INR 1.4*   Cardiac Enzymes: No results for input(s): "CKTOTAL",  "CKMB", "CKMBINDEX", "TROPONINI" in the last 168 hours. BNP (last 3 results) No results for input(s): "PROBNP" in the last 8760 hours. HbA1C: No results for input(s): "HGBA1C" in the last 72 hours. CBG: No results for input(s): "GLUCAP" in the last 168 hours. Lipid Profile: No results for input(s): "CHOL", "HDL", "LDLCALC", "TRIG", "CHOLHDL", "LDLDIRECT" in the last 72 hours. Thyroid Function Tests: No results for input(s): "TSH", "T4TOTAL", "FREET4", "T3FREE", "THYROIDAB" in the last 72 hours. Anemia Panel: No results for input(s): "VITAMINB12", "FOLATE", "FERRITIN", "TIBC", "IRON", "RETICCTPCT" in the last 72 hours. Sepsis Labs: Recent Labs  Lab 08/03/2023 1135 08/05/2023 1424  LATICACIDVEN 12.2* 7.6*    Recent Results (from the past 240 hour(s))  Urine Culture     Status: Abnormal (Preliminary result)   Collection Time: 07/19/2023 10:57 AM   Specimen: Urine, Clean Catch  Result Value Ref Range Status   Specimen Description URINE, CLEAN CATCH  Final   Special Requests NONE Reflexed from [id number]  Final   Culture (A)  Final    >=100,000 COLONIES/mL ESCHERICHIA COLI CULTURE REINCUBATED FOR BETTER GROWTH Performed at [facility], [address], [place], [place] [address]    Report Status PENDING  Incomplete   Organism ID, Bacteria ESCHERICHIA COLI (A)  Final      Susceptibility   Escherichia coli - MIC*    AMPICILLIN <=2 SENSITIVE Sensitive     CEFAZOLIN <=4 SENSITIVE Sensitive     CEFEPIME <=0.12 SENSITIVE Sensitive     CEFTRIAXONE <=0.25 SENSITIVE Sensitive     CIPROFLOXACIN <=0.25 SENSITIVE Sensitive     GENTAMICIN >=16 RESISTANT Resistant     IMIPENEM <=0.25 SENSITIVE Sensitive     NITROFURANTOIN <=16 SENSITIVE Sensitive     TRIMETH/SULFA <=20 SENSITIVE Sensitive     AMPICILLIN/SULBACTAM <=2 SENSITIVE Sensitive     PIP/TAZO <=4 SENSITIVE Sensitive     * >=100,000 COLONIES/mL ESCHERICHIA COLI  Blood Culture (routine x 2)     Status: Abnormal   Collection Time:  08/12/2023 11:22 AM   Specimen: BLOOD RIGHT FOREARM  Result Value Ref Range Status   Specimen Description BLOOD RIGHT FOREARM  Final   Special Requests   Final    BOTTLES DRAWN AEROBIC AND ANAEROBIC Blood Culture adequate volume   Culture  Setup Time   Final    GRAM NEGATIVE RODS IN BOTH AEROBIC AND ANAEROBIC BOTTLES CRITICAL RESULT CALLED TO, READ BACK BY AND VERIFIED WITH: J [facility]  08/15/23 MK Performed at [facility], [address] [place], [place] [address]    Culture ESCHERICHIA COLI (A)  Final   Report Status 08/17/2023 FINAL  Final   Organism  ID, Bacteria ESCHERICHIA COLI  Final      Susceptibility   Escherichia coli - MIC*    AMPICILLIN <=2 SENSITIVE Sensitive     CEFEPIME <=0.12 SENSITIVE Sensitive     CEFTAZIDIME <=1 SENSITIVE Sensitive     CEFTRIAXONE <=0.25 SENSITIVE Sensitive     CIPROFLOXACIN <=0.25 SENSITIVE Sensitive     GENTAMICIN >=16 RESISTANT Resistant     IMIPENEM <=0.25 SENSITIVE Sensitive     TRIMETH/SULFA <=20 SENSITIVE Sensitive     AMPICILLIN/SULBACTAM <=2 SENSITIVE Sensitive     PIP/TAZO <=4 SENSITIVE Sensitive     * ESCHERICHIA COLI  Blood Culture ID Panel (Reflexed)     Status: Abnormal   Collection Time: 08/08/2023 11:22 AM  Result Value Ref Range Status   Enterococcus faecalis NOT DETECTED NOT DETECTED Final   Enterococcus Faecium NOT DETECTED NOT DETECTED Final   Listeria monocytogenes NOT DETECTED NOT DETECTED Final   Staphylococcus species NOT DETECTED NOT DETECTED Final   Staphylococcus aureus (BCID) NOT DETECTED NOT DETECTED Final   Staphylococcus epidermidis NOT DETECTED NOT DETECTED Final   Staphylococcus lugdunensis NOT DETECTED NOT DETECTED Final   Streptococcus species NOT DETECTED NOT DETECTED Final   Streptococcus agalactiae NOT DETECTED NOT DETECTED Final   Streptococcus pneumoniae NOT DETECTED NOT DETECTED Final   Streptococcus pyogenes NOT DETECTED NOT DETECTED Final   A.calcoaceticus-baumannii NOT DETECTED NOT  DETECTED Final   Bacteroides fragilis NOT DETECTED NOT DETECTED Final   Enterobacterales DETECTED (A) NOT DETECTED Final    Comment: Enterobacterales represent a large order of gram negative bacteria, not a single organism. CRITICAL RESULT CALLED TO, READ BACK BY AND VERIFIED WITH: J WYLAND,PHARMD@[id number]  08/15/23 MK    Enterobacter cloacae complex NOT DETECTED NOT DETECTED Final   Escherichia coli DETECTED (A) NOT DETECTED Final    Comment: CRITICAL RESULT CALLED TO, READ BACK BY AND VERIFIED WITH: J WYLAND,PHARMD@[id number]  08/15/23 MK    Klebsiella aerogenes NOT DETECTED NOT DETECTED Final   Klebsiella oxytoca NOT DETECTED NOT DETECTED Final   Klebsiella pneumoniae NOT DETECTED NOT DETECTED Final   Proteus species NOT DETECTED NOT DETECTED Final   Salmonella species NOT DETECTED NOT DETECTED Final   Serratia marcescens NOT DETECTED NOT DETECTED Final   Haemophilus influenzae NOT DETECTED NOT DETECTED Final   Neisseria meningitidis NOT DETECTED NOT DETECTED Final   Pseudomonas aeruginosa NOT DETECTED NOT DETECTED Final   Stenotrophomonas maltophilia NOT DETECTED NOT DETECTED Final   Candida albicans NOT DETECTED NOT DETECTED Final   Candida auris NOT DETECTED NOT DETECTED Final   Candida glabrata NOT DETECTED NOT DETECTED Final   Candida krusei NOT DETECTED NOT DETECTED Final   Candida parapsilosis NOT DETECTED NOT DETECTED Final   Candida tropicalis NOT DETECTED NOT DETECTED Final   Cryptococcus neoformans/gattii NOT DETECTED NOT DETECTED Final   CTX-M ESBL NOT DETECTED NOT DETECTED Final   Carbapenem resistance IMP NOT DETECTED NOT DETECTED Final   Carbapenem resistance KPC NOT DETECTED NOT DETECTED Final   Carbapenem resistance NDM NOT DETECTED NOT DETECTED Final   Carbapenem resist OXA 48 LIKE NOT DETECTED NOT DETECTED Final   Carbapenem resistance VIM NOT DETECTED NOT DETECTED Final    Comment: Performed at [facility], [address], [place], [place] [address]  Resp panel  by RT-PCR (RSV, Flu A&B, Covid) Anterior Nasal Swab     Status: None   Collection Time: 07/21/2023  2:12 PM   Specimen: Anterior Nasal Swab  Result Value Ref Range Status   SARS Coronavirus 2  by RT PCR NEGATIVE NEGATIVE Final   Influenza A by PCR NEGATIVE NEGATIVE Final   Influenza B by PCR NEGATIVE NEGATIVE Final    Comment: (NOTE) The Xpert Xpress SARS-CoV-2/FLU/RSV plus assay is intended as an aid in the diagnosis of influenza from Nasopharyngeal swab specimens and should not be used as a sole basis for treatment. Nasal washings and aspirates are unacceptable for Xpert Xpress SARS-CoV-2/FLU/RSV testing.  Fact Sheet for Patients: BloggerCourse.com  Fact Sheet for Healthcare Providers: SeriousBroker.it  This test is not yet approved or cleared by the [place] FDA and has been authorized for detection and/or diagnosis of SARS-CoV-2 by FDA under an Emergency Use Authorization (EUA). This EUA will remain in effect (meaning this test can be used) for the duration of the COVID-19 declaration under Section 564(b)(1) of the Act, 21 U.S.C. section 360bbb-3(b)(1), unless the authorization is terminated or revoked.     Resp Syncytial Virus by PCR NEGATIVE NEGATIVE Final    Comment: (NOTE) Fact Sheet for Patients: BloggerCourse.com  Fact Sheet for Healthcare Providers: SeriousBroker.it  This test is not yet approved or cleared by the [place] FDA and has been authorized for detection and/or diagnosis of SARS-CoV-2 by FDA under an Emergency Use Authorization (EUA). This EUA will remain in effect (meaning this test can be used) for the duration of the COVID-19 declaration under Section 564(b)(1) of the Act, 21 U.S.C. section 360bbb-3(b)(1), unless the authorization is terminated or revoked.  Performed at [facility], [address], [place], [place] [address]           Radiology Studies: No results found.         LOS: 3 days   Time spent= 35 mins    Miguel Rota, MD [facility]  If 7PM-7AM, please contact night-coverage  08/17/2023, 11:00 AM

## 2023-08-18 LAB — URINE CULTURE: Culture: >=100000 — AB

## 2023-08-18 DEATH — deceased

## 2023-09-17 NOTE — Progress Notes (Signed)
Spoke with Pt Health care agent Consuella Lose. Phones have been on silence so wasn't getting the call of pt passing. She would like to come to room to view pt before taking to morgue. Updated Funeral home information and awaiting her arrival to come view patient.

## 2023-09-17 NOTE — Death Summary Note (Signed)
DEATH SUMMARY   Patient Details  Name: Barry [name] MRN: [id number] DOB: 01/01/1946 [facility]:[id number], Barry Scott, MD Admission/Discharge Information   Admit Date:  Aug 31, 2023  Date of Death: Date of Death: 2023-09-04  Time of Death: Time of Death: 0320  Length of Stay: 4   Principle Cause of death: Severe Sepsis.   Hospital Diagnoses: Principal Problem:   Severe sepsis (HCC) Active Problems:   Asthma, chronic   GASTROESOPHAGEAL REFLUX DISEASE   ULCERATIVE COLITIS   CAD (coronary artery disease), native coronary artery   Benign essential HTN   Hyperlipidemia LDL goal <70   Hx of CABG   Malignant neoplasm of overlapping sites of bladder (HCC)   Class 2 obesity   Paroxysmal atrial fibrillation with RVR (HCC)    Brief Narrative:  78 year old with history of HTN, HLD, CAD status post CABG, GERD, A-fib, asthma, obesity, bladder cancer, ulcerative colitis presented with altered mental status and shortness of breath.  Patient was recently admitted for urinary tract infection which grew E. coli and was discharged to nursing home with DNR status as patient was not a candidate for any aggressive treatments for his bladder cancer s/p resection in July with second stage being done on 8/6 by urology.  During this time patient has continued to decline.  Now he has been transition to comfort care. Eventually passed away on 09/04/23 at 3:20am     Assessment & Plan:  Principal Problem:   Severe sepsis (HCC) Active Problems:   Asthma, chronic   GASTROESOPHAGEAL REFLUX DISEASE   ULCERATIVE COLITIS   CAD (coronary artery disease), native coronary artery   Benign essential HTN   Hyperlipidemia LDL goal <70   Hx of CABG   Malignant neoplasm of overlapping sites of bladder (HCC)   Class 2 obesity   Paroxysmal atrial fibrillation with RVR (HCC)   Severe sepsis secondary to urinary tract infection Bladder cancer status post recent multiple resection Essential hypertension CAD status post  CABG Hyperlipidemia GERD Atrial fibrillation with RVR Asthma Hospice/comfort care patient          The results of significant diagnostics from this hospitalization (including imaging, microbiology, ancillary and laboratory) are listed below for reference.   Significant Diagnostic Studies: DG Chest Port 1 View  Result Date: 31-Aug-2023 CLINICAL DATA:  Sepsis, coronary artery disease. EXAM: PORTABLE CHEST 1 VIEW COMPARISON:  July 21, 2023. FINDINGS: Status post coronary artery bypass graft. Elevated left hemidiaphragm is again noted. No acute pulmonary disease. Degenerative changes are seen involving both shoulders. IMPRESSION: No active disease. Electronically Signed   By: Lupita Raider M.D.   On: 08-31-23 11:36   CT Head Wo Contrast  Result Date: 07/21/2023 CLINICAL DATA:  Mental status change, unknown cause EXAM: CT HEAD WITHOUT CONTRAST TECHNIQUE: Contiguous axial images were obtained from the base of the skull through the vertex without intravenous contrast. RADIATION DOSE REDUCTION: This exam was performed according to the departmental dose-optimization program which includes automated exposure control, adjustment of the mA and/or kV according to patient size and/or use of iterative reconstruction technique. COMPARISON:  None Available. FINDINGS: Brain: No evidence of acute infarction, hemorrhage, hydrocephalus, extra-axial collection or mass lesion/mass effect. Sequela of mild chronic microvascular ischemic change. Generalized volume loss. Vascular: No hyperdense vessel or unexpected calcification. Skull: Normal. Negative for fracture or focal lesion. Sinuses/Orbits: No middle ear or mastoid effusion. Paranasal sinuses are clear. Bilateral lens replacement. Orbits are otherwise unremarkable. Other: None. IMPRESSION: No acute intracranial abnormality. Electronically Signed   By: Sandria Senter  Celine Mans M.D.   On: 07/21/2023 13:31   DG Chest Port 1 View  Result Date: 07/21/2023 CLINICAL DATA:   Altered mental status, questionable sepsis. EXAM: PORTABLE CHEST 1 VIEW COMPARISON:  Chest radiograph dated 06/07/2021. FINDINGS: The heart size and mediastinal contours are within normal limits. The lung volumes are low. There is mild left basilar atelectasis/airspace disease. The right lung is clear. No large pleural effusion or pneumothorax is identified. The visualized skeletal structures are unremarkable. IMPRESSION: Low lung volumes with mild left basilar atelectasis/airspace disease. Electronically Signed   By: Romona Curls M.D.   On: 07/21/2023 13:15    Microbiology: Recent Results (from the past 240 hour(s))  Urine Culture     Status: Abnormal   Collection Time: 07/20/2023 10:57 AM   Specimen: Urine, Clean Catch  Result Value Ref Range Status   Specimen Description URINE, CLEAN CATCH  Final   Special Requests   Final    NONE Reflexed from [id number] Performed at [facility], [address] [place], [place] [address]    Culture (A)  Final    >=100,000 COLONIES/mL ESCHERICHIA COLI >=100,000 COLONIES/mL ENTEROCOCCUS FAECALIS    Report Status 09/01/2023 FINAL  Final   Organism ID, Bacteria ESCHERICHIA COLI (A)  Final   Organism ID, Bacteria ENTEROCOCCUS FAECALIS (A)  Final      Susceptibility   Escherichia coli - MIC*    AMPICILLIN <=2 SENSITIVE Sensitive     CEFAZOLIN <=4 SENSITIVE Sensitive     CEFEPIME <=0.12 SENSITIVE Sensitive     CEFTRIAXONE <=0.25 SENSITIVE Sensitive     CIPROFLOXACIN <=0.25 SENSITIVE Sensitive     GENTAMICIN >=16 RESISTANT Resistant     IMIPENEM <=0.25 SENSITIVE Sensitive     NITROFURANTOIN <=16 SENSITIVE Sensitive     TRIMETH/SULFA <=20 SENSITIVE Sensitive     AMPICILLIN/SULBACTAM <=2 SENSITIVE Sensitive     PIP/TAZO <=4 SENSITIVE Sensitive     * >=100,000 COLONIES/mL ESCHERICHIA COLI   Enterococcus faecalis - MIC*    AMPICILLIN <=2 SENSITIVE Sensitive     NITROFURANTOIN <=16 SENSITIVE Sensitive     VANCOMYCIN 1 SENSITIVE Sensitive     *  >=100,000 COLONIES/mL ENTEROCOCCUS FAECALIS  Blood Culture (routine x 2)     Status: Abnormal   Collection Time: 07/30/2023 11:22 AM   Specimen: BLOOD RIGHT FOREARM  Result Value Ref Range Status   Specimen Description BLOOD RIGHT FOREARM  Final   Special Requests   Final    BOTTLES DRAWN AEROBIC AND ANAEROBIC Blood Culture adequate volume   Culture  Setup Time   Final    GRAM NEGATIVE RODS IN BOTH AEROBIC AND ANAEROBIC BOTTLES CRITICAL RESULT CALLED TO, READ BACK BY AND VERIFIED WITH: J WYLAND,PHARMD@[id number]  08/15/23 MK Performed at [facility], [address] [place], [place] [address]    Culture ESCHERICHIA COLI (A)  Final   Report Status 08/17/2023 FINAL  Final   Organism ID, Bacteria ESCHERICHIA COLI  Final      Susceptibility   Escherichia coli - MIC*    AMPICILLIN <=2 SENSITIVE Sensitive     CEFEPIME <=0.12 SENSITIVE Sensitive     CEFTAZIDIME <=1 SENSITIVE Sensitive     CEFTRIAXONE <=0.25 SENSITIVE Sensitive     CIPROFLOXACIN <=0.25 SENSITIVE Sensitive     GENTAMICIN >=16 RESISTANT Resistant     IMIPENEM <=0.25 SENSITIVE Sensitive     TRIMETH/SULFA <=20 SENSITIVE Sensitive     AMPICILLIN/SULBACTAM <=2 SENSITIVE Sensitive     PIP/TAZO <=4 SENSITIVE Sensitive     *  ESCHERICHIA COLI  Blood Culture ID Panel (Reflexed)     Status: Abnormal   Collection Time: 07/21/2023 11:22 AM  Result Value Ref Range Status   Enterococcus faecalis NOT DETECTED NOT DETECTED Final   Enterococcus Faecium NOT DETECTED NOT DETECTED Final   Listeria monocytogenes NOT DETECTED NOT DETECTED Final   Staphylococcus species NOT DETECTED NOT DETECTED Final   Staphylococcus aureus (BCID) NOT DETECTED NOT DETECTED Final   Staphylococcus epidermidis NOT DETECTED NOT DETECTED Final   Staphylococcus lugdunensis NOT DETECTED NOT DETECTED Final   Streptococcus species NOT DETECTED NOT DETECTED Final   Streptococcus agalactiae NOT DETECTED NOT DETECTED Final   Streptococcus pneumoniae NOT DETECTED NOT  DETECTED Final   Streptococcus pyogenes NOT DETECTED NOT DETECTED Final   A.calcoaceticus-baumannii NOT DETECTED NOT DETECTED Final   Bacteroides fragilis NOT DETECTED NOT DETECTED Final   Enterobacterales DETECTED (A) NOT DETECTED Final    Comment: Enterobacterales represent a large order of gram negative bacteria, not a single organism. CRITICAL RESULT CALLED TO, READ BACK BY AND VERIFIED WITH: J WYLAND,PHARMD@[id number]  08/15/23 MK    Enterobacter cloacae complex NOT DETECTED NOT DETECTED Final   Escherichia coli DETECTED (A) NOT DETECTED Final    Comment: CRITICAL RESULT CALLED TO, READ BACK BY AND VERIFIED WITH: J WYLAND,PHARMD@[id number]  08/15/23 MK    Klebsiella aerogenes NOT DETECTED NOT DETECTED Final   Klebsiella oxytoca NOT DETECTED NOT DETECTED Final   Klebsiella pneumoniae NOT DETECTED NOT DETECTED Final   Proteus species NOT DETECTED NOT DETECTED Final   Salmonella species NOT DETECTED NOT DETECTED Final   Serratia marcescens NOT DETECTED NOT DETECTED Final   Haemophilus influenzae NOT DETECTED NOT DETECTED Final   Neisseria meningitidis NOT DETECTED NOT DETECTED Final   Pseudomonas aeruginosa NOT DETECTED NOT DETECTED Final   Stenotrophomonas maltophilia NOT DETECTED NOT DETECTED Final   Candida albicans NOT DETECTED NOT DETECTED Final   Candida auris NOT DETECTED NOT DETECTED Final   Candida glabrata NOT DETECTED NOT DETECTED Final   Candida krusei NOT DETECTED NOT DETECTED Final   Candida parapsilosis NOT DETECTED NOT DETECTED Final   Candida tropicalis NOT DETECTED NOT DETECTED Final   Cryptococcus neoformans/gattii NOT DETECTED NOT DETECTED Final   CTX-M ESBL NOT DETECTED NOT DETECTED Final   Carbapenem resistance IMP NOT DETECTED NOT DETECTED Final   Carbapenem resistance KPC NOT DETECTED NOT DETECTED Final   Carbapenem resistance NDM NOT DETECTED NOT DETECTED Final   Carbapenem resist OXA 48 LIKE NOT DETECTED NOT DETECTED Final   Carbapenem resistance VIM NOT DETECTED NOT  DETECTED Final    Comment: Performed at [facility], [address], [place], [place] [address]  Resp panel by RT-PCR (RSV, Flu A&B, Covid) Anterior Nasal Swab     Status: None   Collection Time: 07/18/2023  2:12 PM   Specimen: Anterior Nasal Swab  Result Value Ref Range Status   SARS Coronavirus 2 by RT PCR NEGATIVE NEGATIVE Final   Influenza A by PCR NEGATIVE NEGATIVE Final   Influenza B by PCR NEGATIVE NEGATIVE Final    Comment: (NOTE) The Xpert Xpress SARS-CoV-2/FLU/RSV plus assay is intended as an aid in the diagnosis of influenza from Nasopharyngeal swab specimens and should not be used as a sole basis for treatment. Nasal washings and aspirates are unacceptable for Xpert Xpress SARS-CoV-2/FLU/RSV testing.  Fact Sheet for Patients: BloggerCourse.com  Fact Sheet for Healthcare Providers: SeriousBroker.it  This test is not yet approved or cleared by the [place] and has been authorized  for detection and/or diagnosis of SARS-CoV-2 by FDA under an Emergency Use Authorization (EUA). This EUA will remain in effect (meaning this test can be used) for the duration of the COVID-19 declaration under Section 564(b)(1) of the Act, 21 U.S.C. section 360bbb-3(b)(1), unless the authorization is terminated or revoked.     Resp Syncytial Virus by PCR NEGATIVE NEGATIVE Final    Comment: (NOTE) Fact Sheet for Patients: BloggerCourse.com  Fact Sheet for Healthcare Providers: SeriousBroker.it  This test is not yet approved or cleared by the [place] FDA and has been authorized for detection and/or diagnosis of SARS-CoV-2 by FDA under an Emergency Use Authorization (EUA). This EUA will remain in effect (meaning this test can be used) for the duration of the COVID-19 declaration under Section 564(b)(1) of the Act, 21 U.S.C. section 360bbb-3(b)(1), unless the authorization is  terminated or revoked.  Performed at [facility], [address] [place], [place] [address]     Time spent: 15 minutes  Signed: Miguel Rota, MD 09/15/2023

## 2023-09-17 NOTE — Progress Notes (Addendum)
Pt pronounce by RN's at 0320. Pt emergency contact Verner Mould called. No answer left voicemail to call the hospital back/unit number given. On call provider Dr. Julian Reil aware. [facility] aware and pt is not donor. Awaiting return call from Friend to see if she would like to come to view pt and funeral home information needed. Wasted morphine drip with Dauda Collier 100 ml wasted including the med in tubing. Pharmacy notified

## 2023-09-17 DEATH — deceased

## 2023-10-08 ENCOUNTER — Ambulatory Visit: Payer: Medicare Other | Admitting: Cardiovascular Disease

## 2024-07-13 NOTE — Progress Notes (Signed)
 This encounter was created in error - please disregard.
